# Patient Record
Sex: Female | Born: 1935 | Race: White | Hispanic: No | Marital: Married | Smoking: Never smoker
Health system: Southern US, Community
[De-identification: ages and names within clinical notes are randomized; demographics above are authoritative.]

## PROBLEM LIST (undated history)

## (undated) DIAGNOSIS — I350 Nonrheumatic aortic (valve) stenosis: Secondary | ICD-10-CM

## (undated) DIAGNOSIS — F329 Major depressive disorder, single episode, unspecified: Secondary | ICD-10-CM

## (undated) DIAGNOSIS — I447 Left bundle-branch block, unspecified: Secondary | ICD-10-CM

## (undated) DIAGNOSIS — E119 Type 2 diabetes mellitus without complications: Secondary | ICD-10-CM

## (undated) DIAGNOSIS — H9190 Unspecified hearing loss, unspecified ear: Secondary | ICD-10-CM

## (undated) DIAGNOSIS — N390 Urinary tract infection, site not specified: Secondary | ICD-10-CM

## (undated) DIAGNOSIS — Z8489 Family history of other specified conditions: Secondary | ICD-10-CM

## (undated) DIAGNOSIS — M1990 Unspecified osteoarthritis, unspecified site: Secondary | ICD-10-CM

## (undated) DIAGNOSIS — R51 Headache: Secondary | ICD-10-CM

## (undated) DIAGNOSIS — Z9889 Other specified postprocedural states: Secondary | ICD-10-CM

## (undated) DIAGNOSIS — I429 Cardiomyopathy, unspecified: Secondary | ICD-10-CM

## (undated) DIAGNOSIS — R112 Nausea with vomiting, unspecified: Secondary | ICD-10-CM

## (undated) DIAGNOSIS — R1310 Dysphagia, unspecified: Secondary | ICD-10-CM

## (undated) DIAGNOSIS — D649 Anemia, unspecified: Secondary | ICD-10-CM

## (undated) DIAGNOSIS — N189 Chronic kidney disease, unspecified: Secondary | ICD-10-CM

## (undated) DIAGNOSIS — K219 Gastro-esophageal reflux disease without esophagitis: Secondary | ICD-10-CM

## (undated) DIAGNOSIS — F419 Anxiety disorder, unspecified: Secondary | ICD-10-CM

## (undated) DIAGNOSIS — R519 Headache, unspecified: Secondary | ICD-10-CM

## (undated) DIAGNOSIS — I2510 Atherosclerotic heart disease of native coronary artery without angina pectoris: Secondary | ICD-10-CM

## (undated) DIAGNOSIS — E782 Mixed hyperlipidemia: Secondary | ICD-10-CM

## (undated) DIAGNOSIS — I214 Non-ST elevation (NSTEMI) myocardial infarction: Secondary | ICD-10-CM

## (undated) DIAGNOSIS — F32A Depression, unspecified: Secondary | ICD-10-CM

## (undated) DIAGNOSIS — I10 Essential (primary) hypertension: Secondary | ICD-10-CM

## (undated) DIAGNOSIS — Z8719 Personal history of other diseases of the digestive system: Secondary | ICD-10-CM

## (undated) HISTORY — DX: Atherosclerotic heart disease of native coronary artery without angina pectoris: I25.10

## (undated) HISTORY — DX: Cardiomyopathy, unspecified: I42.9

## (undated) HISTORY — PX: ABDOMINAL HYSTERECTOMY: SHX81

## (undated) HISTORY — DX: Type 2 diabetes mellitus without complications: E11.9

## (undated) HISTORY — PX: CHOLECYSTECTOMY: SHX55

## (undated) HISTORY — DX: Mixed hyperlipidemia: E78.2

## (undated) HISTORY — DX: Left bundle-branch block, unspecified: I44.7

## (undated) HISTORY — DX: Dysphagia, unspecified: R13.10

## (undated) HISTORY — PX: THYROID LOBECTOMY: SHX420

## (undated) HISTORY — DX: Unspecified osteoarthritis, unspecified site: M19.90

## (undated) HISTORY — DX: Nonrheumatic aortic (valve) stenosis: I35.0

## (undated) HISTORY — DX: Non-ST elevation (NSTEMI) myocardial infarction: I21.4

## (undated) HISTORY — DX: Essential (primary) hypertension: I10

---

## 2002-05-02 ENCOUNTER — Encounter: Payer: Self-pay | Admitting: Otolaryngology

## 2002-05-02 ENCOUNTER — Ambulatory Visit (HOSPITAL_COMMUNITY): Admission: RE | Admit: 2002-05-02 | Discharge: 2002-05-02 | Payer: Self-pay | Admitting: Otolaryngology

## 2003-10-17 ENCOUNTER — Ambulatory Visit (HOSPITAL_COMMUNITY): Admission: RE | Admit: 2003-10-17 | Discharge: 2003-10-17 | Payer: Self-pay | Admitting: Orthopedic Surgery

## 2004-04-26 HISTORY — PX: CORONARY ARTERY BYPASS GRAFT: SHX141

## 2004-04-26 HISTORY — PX: AORTIC VALVE REPLACEMENT: SHX41

## 2004-04-26 HISTORY — PX: TOTAL KNEE ARTHROPLASTY: SHX125

## 2004-05-07 ENCOUNTER — Ambulatory Visit: Payer: Self-pay | Admitting: Cardiology

## 2004-05-11 ENCOUNTER — Ambulatory Visit: Payer: Self-pay | Admitting: Cardiology

## 2004-05-11 ENCOUNTER — Inpatient Hospital Stay (HOSPITAL_BASED_OUTPATIENT_CLINIC_OR_DEPARTMENT_OTHER): Admission: RE | Admit: 2004-05-11 | Discharge: 2004-05-11 | Payer: Self-pay | Admitting: Cardiology

## 2004-05-11 ENCOUNTER — Inpatient Hospital Stay (HOSPITAL_COMMUNITY): Admission: AD | Admit: 2004-05-11 | Discharge: 2004-05-20 | Payer: Self-pay | Admitting: Internal Medicine

## 2004-05-12 ENCOUNTER — Encounter: Payer: Self-pay | Admitting: Cardiology

## 2004-05-15 ENCOUNTER — Encounter (INDEPENDENT_AMBULATORY_CARE_PROVIDER_SITE_OTHER): Payer: Self-pay | Admitting: Specialist

## 2004-06-16 ENCOUNTER — Ambulatory Visit: Payer: Self-pay | Admitting: Internal Medicine

## 2004-06-23 ENCOUNTER — Ambulatory Visit: Payer: Self-pay | Admitting: Cardiology

## 2004-07-13 ENCOUNTER — Ambulatory Visit: Payer: Self-pay | Admitting: Cardiology

## 2004-07-16 ENCOUNTER — Ambulatory Visit: Payer: Self-pay | Admitting: Cardiology

## 2004-07-16 ENCOUNTER — Inpatient Hospital Stay (HOSPITAL_COMMUNITY): Admission: AD | Admit: 2004-07-16 | Discharge: 2004-07-21 | Payer: Self-pay | Admitting: Cardiology

## 2004-07-20 ENCOUNTER — Encounter: Payer: Self-pay | Admitting: Internal Medicine

## 2004-07-27 ENCOUNTER — Ambulatory Visit: Payer: Self-pay | Admitting: Cardiology

## 2004-08-03 ENCOUNTER — Ambulatory Visit: Payer: Self-pay | Admitting: Cardiology

## 2004-08-06 ENCOUNTER — Ambulatory Visit: Payer: Self-pay | Admitting: Cardiology

## 2004-08-12 ENCOUNTER — Ambulatory Visit: Payer: Self-pay | Admitting: Cardiology

## 2004-09-11 ENCOUNTER — Ambulatory Visit: Payer: Self-pay | Admitting: Internal Medicine

## 2004-09-16 ENCOUNTER — Ambulatory Visit: Payer: Self-pay | Admitting: Cardiology

## 2004-10-12 ENCOUNTER — Ambulatory Visit: Payer: Self-pay | Admitting: Cardiology

## 2004-10-15 ENCOUNTER — Ambulatory Visit: Payer: Self-pay | Admitting: Internal Medicine

## 2004-10-30 ENCOUNTER — Ambulatory Visit: Payer: Self-pay | Admitting: Cardiology

## 2005-02-02 ENCOUNTER — Ambulatory Visit: Payer: Self-pay | Admitting: Internal Medicine

## 2005-02-16 ENCOUNTER — Ambulatory Visit: Payer: Self-pay

## 2005-02-16 ENCOUNTER — Ambulatory Visit: Payer: Self-pay | Admitting: Internal Medicine

## 2005-02-18 ENCOUNTER — Ambulatory Visit: Payer: Self-pay | Admitting: Internal Medicine

## 2005-03-23 ENCOUNTER — Inpatient Hospital Stay (HOSPITAL_COMMUNITY): Admission: RE | Admit: 2005-03-23 | Discharge: 2005-03-27 | Payer: Self-pay | Admitting: Orthopedic Surgery

## 2005-06-11 ENCOUNTER — Ambulatory Visit: Payer: Self-pay | Admitting: Cardiology

## 2005-06-23 ENCOUNTER — Ambulatory Visit: Payer: Self-pay | Admitting: Internal Medicine

## 2005-12-07 ENCOUNTER — Ambulatory Visit: Payer: Self-pay | Admitting: Internal Medicine

## 2005-12-14 ENCOUNTER — Ambulatory Visit: Payer: Self-pay

## 2005-12-14 ENCOUNTER — Encounter: Payer: Self-pay | Admitting: Internal Medicine

## 2006-03-29 IMAGING — CR DG CHEST 2V
2 series · 2 of 2 positions shown · non-contrast
Comparison: 05/17/04.

CLINICAL DATA: Coronary artery bypass graft.  Chest pain. 
 CHEST - 2 VIEW, 05/19/04:

[view not recorded (1 of 2)]
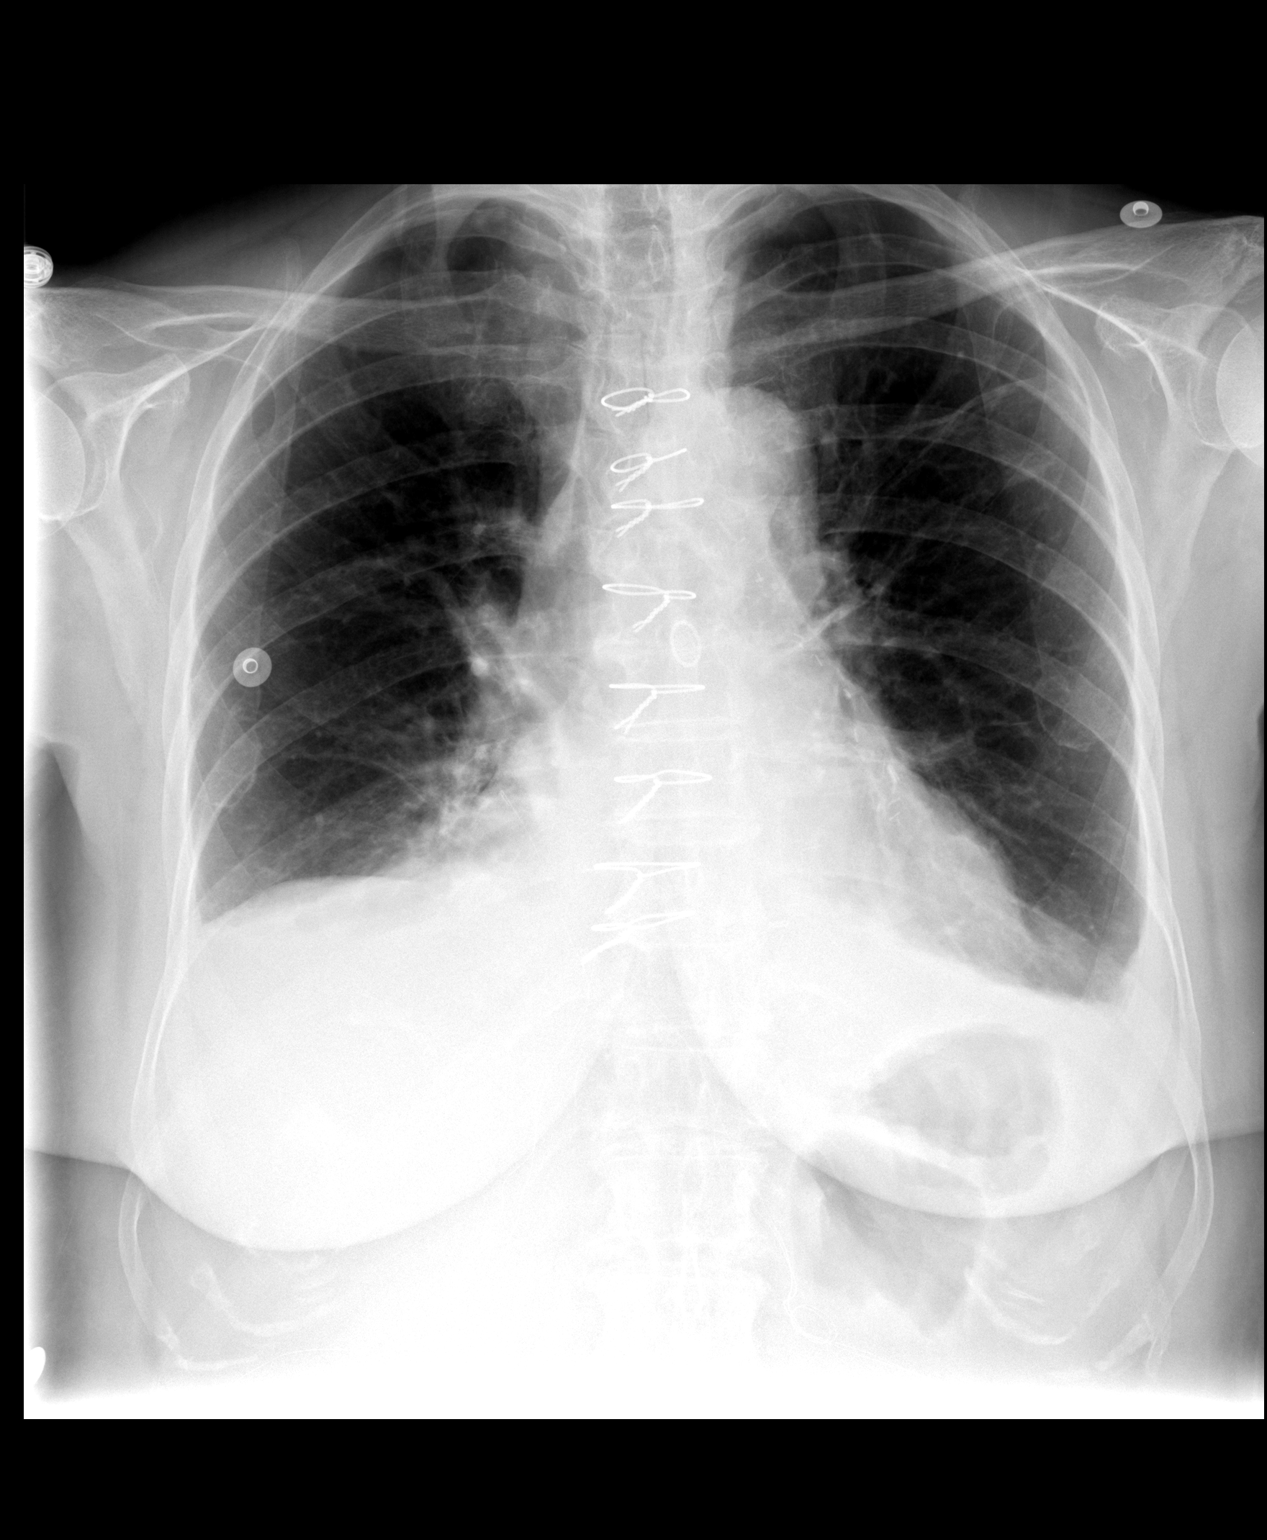

[view not recorded (2 of 2)]
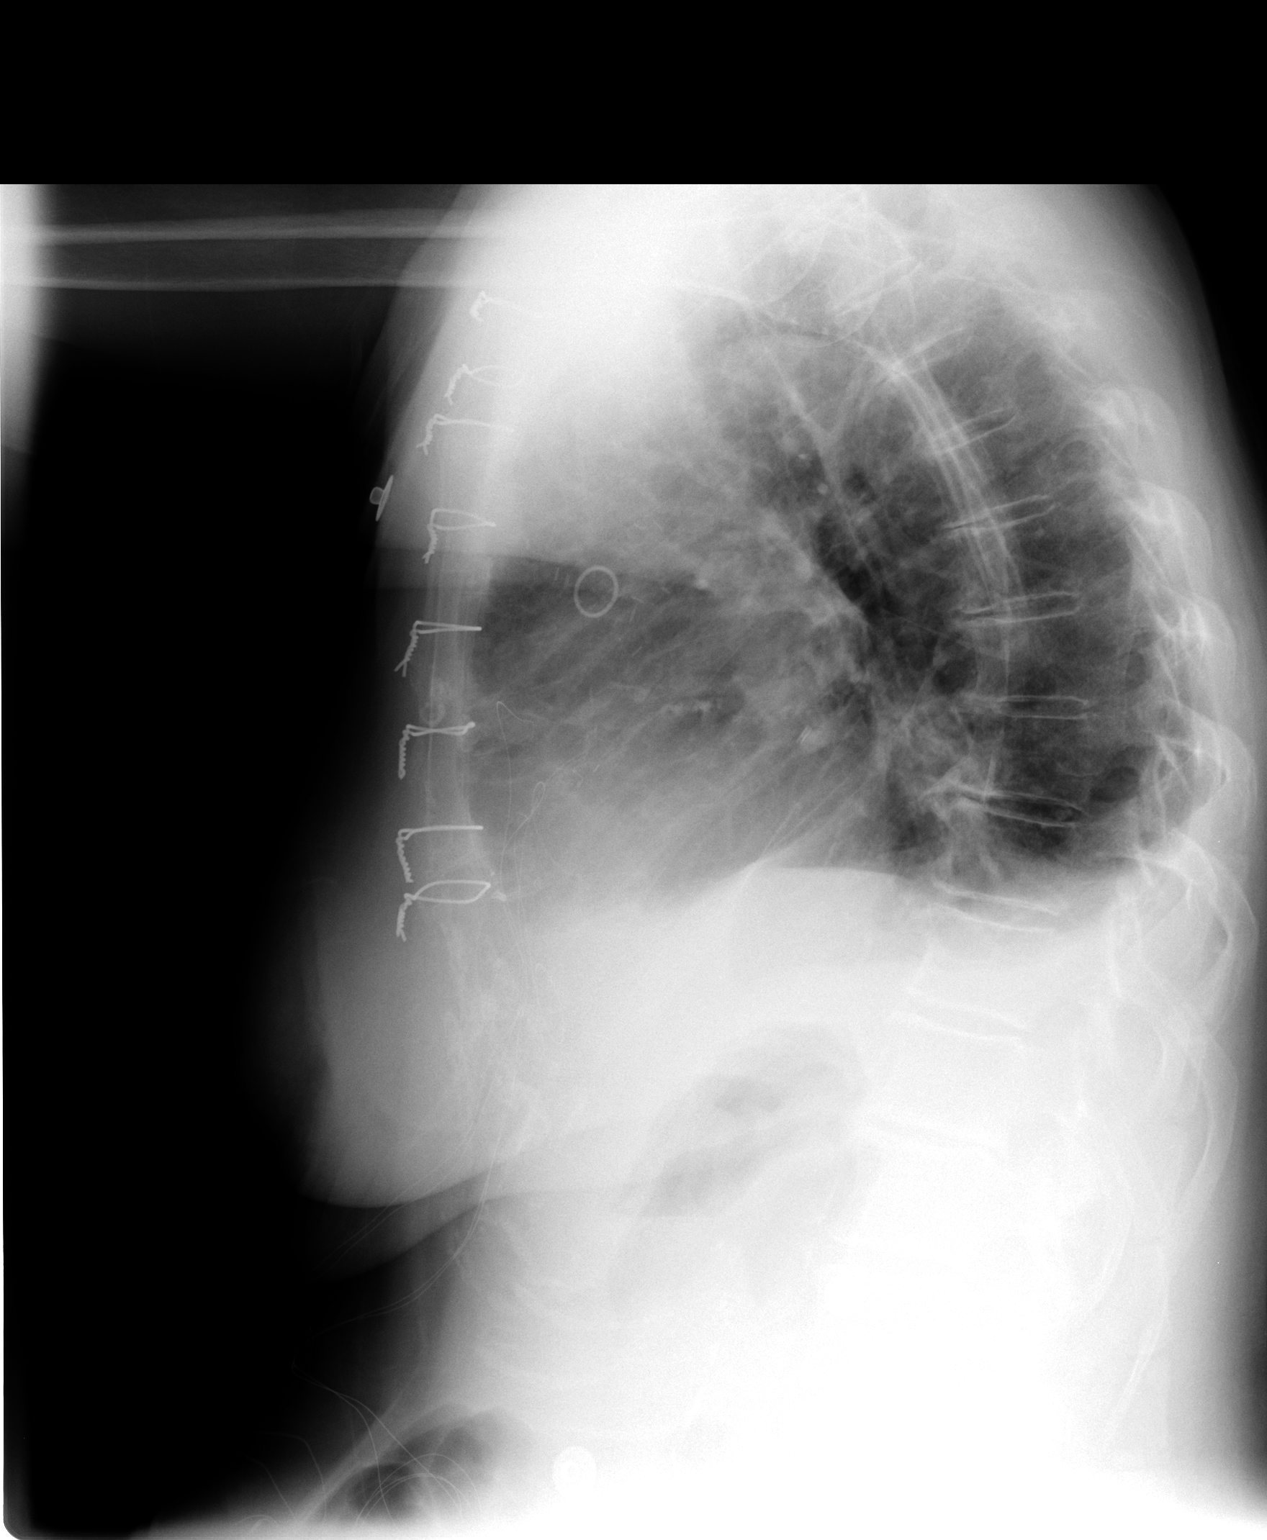

[2 of 2 positions shown; findings below may reference images not displayed]

Two-view exam of the chest shows cardiomegaly with vascular congestion.  There is atelectasis in the left upper lung and both bases with small bilateral pleural effusions.
IMPRESSION: Cardiomegaly with bibasilar atelectasis and small bilateral effusion.

## 2006-06-13 ENCOUNTER — Ambulatory Visit: Payer: Self-pay | Admitting: Internal Medicine

## 2006-11-29 ENCOUNTER — Ambulatory Visit: Payer: Self-pay | Admitting: Internal Medicine

## 2006-12-14 ENCOUNTER — Ambulatory Visit: Payer: Self-pay

## 2006-12-14 ENCOUNTER — Encounter: Payer: Self-pay | Admitting: Internal Medicine

## 2007-06-21 ENCOUNTER — Ambulatory Visit: Payer: Self-pay | Admitting: Internal Medicine

## 2007-12-11 ENCOUNTER — Ambulatory Visit: Payer: Self-pay | Admitting: Internal Medicine

## 2008-01-30 ENCOUNTER — Ambulatory Visit: Payer: Self-pay | Admitting: Internal Medicine

## 2008-01-30 LAB — CONVERTED CEMR LAB
AST: 24 units/L (ref 0–37)
Albumin: 4.3 g/dL (ref 3.5–5.2)
Alkaline Phosphatase: 88 units/L (ref 39–117)
BUN: 25 mg/dL — ABNORMAL HIGH (ref 6–23)
Chloride: 95 meq/L — ABNORMAL LOW (ref 96–112)
Cholesterol: 146 mg/dL (ref 0–200)
GFR calc Af Amer: 69 mL/min
GFR calc non Af Amer: 57 mL/min
LDL Cholesterol: 84 mg/dL (ref 0–99)
Potassium: 3.5 meq/L (ref 3.5–5.1)
Sodium: 138 meq/L (ref 135–145)
TSH: 0.34 microintl units/mL — ABNORMAL LOW (ref 0.35–5.50)
Total Bilirubin: 1 mg/dL (ref 0.3–1.2)
Total CHOL/HDL Ratio: 3
VLDL: 13 mg/dL (ref 0–40)

## 2008-02-12 ENCOUNTER — Ambulatory Visit: Payer: Self-pay

## 2008-02-12 ENCOUNTER — Encounter: Payer: Self-pay | Admitting: Internal Medicine

## 2008-08-03 DIAGNOSIS — E785 Hyperlipidemia, unspecified: Secondary | ICD-10-CM

## 2008-08-05 ENCOUNTER — Ambulatory Visit: Payer: Self-pay | Admitting: Internal Medicine

## 2008-08-05 ENCOUNTER — Encounter: Payer: Self-pay | Admitting: Internal Medicine

## 2008-08-05 DIAGNOSIS — I2510 Atherosclerotic heart disease of native coronary artery without angina pectoris: Secondary | ICD-10-CM | POA: Insufficient documentation

## 2008-08-05 DIAGNOSIS — I10 Essential (primary) hypertension: Secondary | ICD-10-CM

## 2008-08-05 DIAGNOSIS — I359 Nonrheumatic aortic valve disorder, unspecified: Secondary | ICD-10-CM | POA: Insufficient documentation

## 2008-08-05 DIAGNOSIS — I429 Cardiomyopathy, unspecified: Secondary | ICD-10-CM

## 2008-10-25 ENCOUNTER — Encounter (HOSPITAL_COMMUNITY): Admission: RE | Admit: 2008-10-25 | Discharge: 2008-11-24 | Payer: Self-pay | Admitting: Orthopedic Surgery

## 2009-02-20 ENCOUNTER — Ambulatory Visit: Payer: Self-pay | Admitting: Cardiovascular Disease

## 2009-02-20 ENCOUNTER — Encounter: Payer: Self-pay | Admitting: Internal Medicine

## 2009-02-20 ENCOUNTER — Ambulatory Visit: Payer: Self-pay | Admitting: Internal Medicine

## 2009-02-20 ENCOUNTER — Ambulatory Visit (HOSPITAL_COMMUNITY): Admission: RE | Admit: 2009-02-20 | Discharge: 2009-02-20 | Payer: Self-pay | Admitting: Internal Medicine

## 2009-02-20 DIAGNOSIS — I440 Atrioventricular block, first degree: Secondary | ICD-10-CM | POA: Insufficient documentation

## 2009-02-20 DIAGNOSIS — I519 Heart disease, unspecified: Secondary | ICD-10-CM | POA: Insufficient documentation

## 2009-10-01 ENCOUNTER — Telehealth (INDEPENDENT_AMBULATORY_CARE_PROVIDER_SITE_OTHER): Payer: Self-pay | Admitting: *Deleted

## 2009-12-09 ENCOUNTER — Encounter: Payer: Self-pay | Admitting: Internal Medicine

## 2009-12-09 DIAGNOSIS — I6529 Occlusion and stenosis of unspecified carotid artery: Secondary | ICD-10-CM

## 2009-12-10 ENCOUNTER — Ambulatory Visit: Payer: Self-pay

## 2009-12-10 ENCOUNTER — Encounter: Payer: Self-pay | Admitting: Internal Medicine

## 2010-02-02 ENCOUNTER — Ambulatory Visit (HOSPITAL_COMMUNITY)
Admission: RE | Admit: 2010-02-02 | Discharge: 2010-02-02 | Payer: Self-pay | Source: Home / Self Care | Admitting: Internal Medicine

## 2010-02-02 ENCOUNTER — Ambulatory Visit: Payer: Self-pay

## 2010-02-02 ENCOUNTER — Ambulatory Visit: Payer: Self-pay | Admitting: Cardiology

## 2010-02-02 ENCOUNTER — Ambulatory Visit: Payer: Self-pay | Admitting: Internal Medicine

## 2010-02-02 ENCOUNTER — Encounter: Payer: Self-pay | Admitting: Internal Medicine

## 2010-02-02 DIAGNOSIS — R5383 Other fatigue: Secondary | ICD-10-CM

## 2010-02-02 DIAGNOSIS — R5381 Other malaise: Secondary | ICD-10-CM

## 2010-02-13 ENCOUNTER — Telehealth: Payer: Self-pay | Admitting: Internal Medicine

## 2010-02-13 ENCOUNTER — Telehealth (INDEPENDENT_AMBULATORY_CARE_PROVIDER_SITE_OTHER): Payer: Self-pay | Admitting: *Deleted

## 2010-05-16 ENCOUNTER — Encounter: Payer: Self-pay | Admitting: Orthopedic Surgery

## 2010-05-17 ENCOUNTER — Encounter: Payer: Self-pay | Admitting: Orthopedic Surgery

## 2010-05-18 ENCOUNTER — Encounter: Payer: Self-pay | Admitting: Orthopedic Surgery

## 2010-05-28 NOTE — Progress Notes (Signed)
  All Cardiac faxed to [facility] @ [phone] [facility]  February 13, 2010 10:27 AM

## 2010-05-28 NOTE — Assessment & Plan Note (Signed)
Summary: f1y   Visit Type:  1 year follow up  CC:  Tiredness.  History of Present Illness: Ms. Bigos is a delightful 77 year old woman with a history of coronary artery disease, LBBB and severe aortic stenosis.  She is status post bypass surgery and aortic valve replacement with St. Jude's bioprosthesis in 2006.  Post-operation, she had a drop in her ejection fraction of 25-30% but this is recovered.  EF was 55% in October 2010 with mildly incresed gradient across valve 17     mm HG.  Returns for routine f/u. Gets tired very easily and SOB with moderate activity - no real change. Feels she can do housework or light housework for 3-4 hours then very fatigued. No orthopnea/PND or edema. No CP. Found to be anemic and asking if she can come off ASA.  PCP has been suggesting colonoscopy but she has refused.   She had routine echo today to f/u AoV. I have reviewed her echo personall EF 50-55% with elevated LV filiing pressures. mild mr/moderate TR. AoV prosthesis looks good. mean gradient 13mm HG  Recent lipids total chol 117 TG 109 HDL 44 LDL 44   Current Medications (verified): 1)  Spironolactone 25 Mg Tabs (Spironolactone) .... 1/2 Once Daily 2)  Metformin Hcl 500 Mg Tabs (Metformin Hcl) .Marland Kitchen.. 1 Once Daily 3)  Lasix 40 Mg Tabs (Furosemide) .Marland Kitchen.. 1 Two Times A Day 4)  Benazepril Hcl 40 Mg Tabs (Benazepril Hcl) .Marland Kitchen.. 1 Once Daily 5)  Aspirin 81 Mg  Tabs (Aspirin) .... Q Once Daily. On Hold 6)  Synthroid 88 Mcg Tabs (Levothyroxine Sodium) .... Take 1 Tablet By Mouth Once A Day 7)  Klor-Con M20 20 Meq Cr-Tabs (Potassium Chloride Crys Cr) .Marland Kitchen.. 1 Two Times A Day 8)  Toprol Xl 100 Mg Xr24h-Tab (Metoprolol Succinate) .Marland Kitchen.. 1 Once Daily 9)  Sertraline Hcl 100 Mg Tabs (Sertraline Hcl) .... 1/2 Once Daily 10)  Omeprazole 20 Mg Tbec (Omeprazole) .Marland Kitchen.. 1 Two Times A Day 11)  Lipitor 80 Mg Tabs (Atorvastatin Calcium) .Marland Kitchen.. 1 Once Daily 12)  Zetia 10 Mg Tabs (Ezetimibe) .... Take 1 Once  Daily  Allergies: 1)  ! Morphine 2)  ! Pcn  Past History:  Past Medical History: Last updated: 02/20/2009  1. Coronary artery disease and severe aortic stenosis discovered during      preoperative evaluation for knee replacement.      a.     S/p AVR with St. Jude Bioprosthesis and 2-V CABG      b.    October 2009. Echo: Mildly incresed gradient across valve 23mm HG      c.    October 2010 Echo: EF 55-605 mild MR, mod TR. signifcant diastolic dysfx. AVG  2. Congestive heart failure postoperatively.      a.     Relook catheterization with patent grafts.      b.     Echocardiogram  March 2006, ejection fraction 25-35% with mild MR      c.     Echocardiogram  October 2006, ejection fraction 60%  3. Mild mitral regurgitation.  4. Osteoarthritis status post knee replacement.  5. Left bundle branch block.  6. Diabetes.  7. Hypertension.  8. Hyperlipidemia.  Review of Systems       As per HPI and past medical history; otherwise all systems negative.   Vital Signs:  Patient profile:   77 year old female Height:      64 inches Weight:  147.50 pounds BMI:     25.41 Pulse rate:   64 / minute Pulse rhythm:   regular Resp:     18 per minute BP sitting:   110 / 60  (left arm) Cuff size:   regular  Vitals Entered By: Vikki Ports (February 02, 2010 2:18 PM)  Physical Exam  General:  Elderly. well appearing. no resp difficulty HEENT: normal Neck: supple. no JVD. Carotids 2+ bilat; no bruits. No lymphadenopathy or thryomegaly appreciated. Cor: PMI nondisplaced. Regular rate & rhythm. No rubs, gallops, murmur. Lungs: clear Abdomen: soft, nontender, nondistended. No hepatosplenomegaly. No bruits or masses. Good bowel sounds. Extremities: no cyanosis, clubbing, rash, edema Neuro: alert & orientedx3, cranial nerves grossly intact. moves all 4 extremities w/o difficulty. affect pleasant    Impression & Recommendations:  Problem # 1:  AORTIC STENOSIS/ INSUFFICIENCY,  NON-RHEUMATIC (ICD-424.1) AVR looks good. Only mild gradient over the valve.  Problem # 2:  CAD, NATIVE VESSEL (ICD-414.01) Stable. No evidence of ischemia. Continue current regimen. Continue ASA 81 once daily.   Problem # 3:  FATIGUE / MALAISE (ICD-780.79) Chronic problem. Likely multifactorial. Given anemia suggested she make appt with Dr. Karilyn Cota to discuss colonoscopy.   Patient Instructions: 1)  Your physician recommends that you schedule a follow-up appointment in: one year 2)  Your physician has requested that you have an echocardiogram.  Echocardiography is a painless test that uses sound waves to create images of your heart. It provides your doctor with information about the size and shape of your heart and how well your heart's chambers and valves are working.  This procedure takes approximately one hour. There are no restrictions for this procedure. To be done in one year on same day as appointment with Dr. Gala Romney. 3)  Call Dr.Rehman to schedule appointment

## 2010-05-28 NOTE — Progress Notes (Signed)
  Mailed Pt [facility]  October 01, 2009 3:58 PM

## 2010-05-28 NOTE — Progress Notes (Signed)
Summary: needs auth to go off aspirin for 5 days  Phone Note From Other Clinic   Caller: [phone] ext [phone] louann Summary of Call: can pt stop aspirin for five days prior to surgery with  orthopedic center/10-26/would prefer a fax auth at [phone] Initial call taken by: Glynda Jaeger,  February 13, 2010 11:04 AM  Follow-up for Phone Call        ok Dolores Patty, MD, [facility]  February 13, 2010 12:26 PM  note faxed Meredith Staggers, RN  February 13, 2010 1:27 PM

## 2010-05-28 NOTE — Miscellaneous (Signed)
Summary: Orders Update  Clinical Lists Changes  Problems: Added new problem of CAROTID ARTERY DISEASE (ICD-433.10) Orders: Added new Test order of Carotid Duplex (Carotid Duplex) - Signed 

## 2010-07-31 ENCOUNTER — Encounter: Payer: Self-pay | Admitting: Internal Medicine

## 2010-08-03 ENCOUNTER — Encounter: Payer: Self-pay | Admitting: Internal Medicine

## 2010-08-04 ENCOUNTER — Encounter: Payer: Self-pay | Admitting: Internal Medicine

## 2010-08-06 ENCOUNTER — Ambulatory Visit (INDEPENDENT_AMBULATORY_CARE_PROVIDER_SITE_OTHER): Payer: Medicare Other | Admitting: Internal Medicine

## 2010-08-06 ENCOUNTER — Encounter: Payer: Self-pay | Admitting: Internal Medicine

## 2010-08-06 VITALS — BP 118/60 | HR 72 | Resp 14 | Ht 64.0 in | Wt 146.0 lb

## 2010-08-06 DIAGNOSIS — R5383 Other fatigue: Secondary | ICD-10-CM

## 2010-08-06 DIAGNOSIS — R5381 Other malaise: Secondary | ICD-10-CM

## 2010-08-06 DIAGNOSIS — E785 Hyperlipidemia, unspecified: Secondary | ICD-10-CM

## 2010-08-06 DIAGNOSIS — I2510 Atherosclerotic heart disease of native coronary artery without angina pectoris: Secondary | ICD-10-CM

## 2010-08-06 MED ORDER — POTASSIUM CHLORIDE CRYS ER 20 MEQ PO TBCR: 20.0000 meq | EXTENDED_RELEASE_TABLET | Freq: Two times a day (BID) | ORAL | Status: DC

## 2010-08-06 MED ORDER — METOPROLOL SUCCINATE ER 50 MG PO TB24: ORAL_TABLET | ORAL | Status: DC

## 2010-08-06 MED ORDER — METOPROLOL SUCCINATE ER 100 MG PO TB24: ORAL_TABLET | ORAL | Status: DC

## 2010-08-06 MED ORDER — ATORVASTATIN CALCIUM 80 MG PO TABS: 80.0000 mg | ORAL_TABLET | Freq: Every day | ORAL | Status: DC

## 2010-08-06 MED ORDER — SPIRONOLACTONE 25 MG PO TABS: 12.5000 mg | ORAL_TABLET | Freq: Every day | ORAL | Status: DC

## 2010-08-06 MED ORDER — FUROSEMIDE 40 MG PO TABS: 40.0000 mg | ORAL_TABLET | Freq: Two times a day (BID) | ORAL | Status: DC

## 2010-08-06 MED ORDER — BENAZEPRIL HCL 40 MG PO TABS: 40.0000 mg | ORAL_TABLET | Freq: Every day | ORAL | Status: DC

## 2010-08-06 MED ORDER — EZETIMIBE 10 MG PO TABS: 10.0000 mg | ORAL_TABLET | Freq: Every day | ORAL | Status: DC

## 2010-08-06 NOTE — Patient Instructions (Signed)
Decrease Metoprolol to 50mg  daily (1/2 tab) Your physician wants you to follow-up in: 3 months You will receive a reminder letter in the mail two months in advance. If you don't receive a letter, please call our office to schedule the follow-up appointment.

## 2010-08-06 NOTE — Assessment & Plan Note (Signed)
Unclear etiology. We reviewed her labs from February and has only mild anemia (hgb 11.0) with normal TSH. We discussed various possibilities. Will try cutting Toprol down to 50 daily and [facility] Check labs with CBC (manual diff), TSH, BMET and ESR.

## 2010-08-06 NOTE — Progress Notes (Signed)
HPI:  Erin Herman is a delightful 77 year old woman with a history of coronary artery disease, LBBB and severe aortic stenosis.  She is status post bypass surgery and aortic valve replacement with St. Jude's bioprosthesis in 2006.  Post-operation, she had a drop in her ejection fraction of 25-30% but this is recovered.  EF was 55% in October 2011 with grade 2 diastolic dysfunction  mildly incresed gradient across valve 15 mm HG.  Returns for routine f/u. Gets tired very easily and SOB with moderate activity. She feels that she is getting more fatigued. Wondering if it is her statin. No orthopnea/PND or edema. No CP. Found to be anemic and asking if she can come off ASA.  PCP has been suggesting colonoscopy but she has refused.   No fever, chills, night sweats or weight loss. Denies depression.   ROS: All systems negative except as listed in HPI, PMH and Problem List.  Past Medical History  Diagnosis Date  . CAD, NATIVE VESSEL 08/05/2008  . HYPERLIPIDEMIA-MIXED 08/03/2008  . HYPERTENSION, BENIGN 08/05/2008  . LBBB (left bundle branch block)   . Diabetes mellitus   . Aortic stenosis     s/p bioprosthetic AVR  . Fatigue     Current Outpatient Prescriptions  Medication Sig Dispense Refill  . aspirin 81 MG tablet Take 81 mg by mouth daily.        Marland Kitchen atorvastatin (LIPITOR) 80 MG tablet Take 80 mg by mouth daily.        . benazepril (LOTENSIN) 40 MG tablet Take 40 mg by mouth daily.        Marland Kitchen ezetimibe (ZETIA) 10 MG tablet Take 10 mg by mouth daily.        . furosemide (LASIX) 40 MG tablet Take 40 mg by mouth 2 (two) times daily.        Marland Kitchen levothyroxine (SYNTHROID, LEVOTHROID) 88 MCG tablet Take 88 mcg by mouth daily.        . metFORMIN (GLUCOPHAGE) 500 MG tablet Take 500 mg by mouth daily with breakfast.        . metoprolol (TOPROL-XL) 100 MG 24 hr tablet Take 100 mg by mouth daily.        Marland Kitchen omeprazole (PRILOSEC) 20 MG capsule Take 20 mg by mouth 2 (two) times daily.        . potassium  chloride SA (K-DUR,KLOR-CON) 20 MEQ tablet Take 20 mEq by mouth 2 (two) times daily.        . sertraline (ZOLOFT) 100 MG tablet Take 50 mg by mouth daily.        Marland Kitchen spironolactone (ALDACTONE) 25 MG tablet Take 12.5 mg by mouth daily.           PHYSICAL EXAM: Filed Vitals:   08/06/10 1648  BP: 118/60  Pulse: 72  Resp: 14   General:  Well appearing. No resp difficulty HEENT: normal Neck: supple. JVP flat. Carotids 2+ bilaterally; no bruits. No lymphadenopathy or thryomegaly appreciated. Cor: PMI normal. Regular rate & rhythm. No rubs, gallops or murmurs. Lungs: clear Abdomen: soft, nontender, nondistended. No hepatosplenomegaly. No bruits or masses. Good bowel sounds. Extremities: no cyanosis, clubbing, rash, edema Neuro: alert & orientedx3, cranial nerves grossly intact. Moves all 4 extremities w/o difficulty. Affect pleasant.    ECG: refused   ASSESSMENT & PLAN:

## 2010-08-06 NOTE — Assessment & Plan Note (Signed)
Doubt fatigue related to cholesterol meds but told her she can stop for a few weeks if needed.

## 2010-08-06 NOTE — Assessment & Plan Note (Signed)
No evidence of ischemia. Continue current regimen.   

## 2010-09-08 NOTE — Assessment & Plan Note (Signed)
[facility]                            CARDIOLOGY OFFICE NOTE   NAME:Erin Herman, Erin Herman                MRN:          [id number]  DATE:01/30/2008                            DOB:          May 31, 1927    PRIMARY CARE PHYSICIAN:  Kirstie Peri, MD   INTERVAL HISTORY:  Erin Herman is a delightful 77 year old woman with  a history of coronary artery disease and severe aortic stenosis.  She is  status post bypass surgery and aortic valve replacement with St. Jude's  bioprosthesis in 2006.  Post-operation, she had a drop in her ejection  fraction of 25-30% but this is recovered.  Most recent EF was 55-60% in  August 2008.  She did have moderate mitral regurgitation at that time.   She returns today for routine followup.  She says she is doing fairly  well.  She denies any chest pain or shortness of breath.  She says she  just feels weak, but she says she can keep up with other woman of her  age.  She has not had any orthopnea, no PND.  No lower extremity edema.   CURRENT MEDICATIONS:  1. Spironolactone 12.5 a day.  2. Metformin 500 a day.  3. Lasix 40 b.i.d.  4. Benazepril 40 a day.  5. Aspirin 81 a day.  6. Synthroid 100 mcg a day.  7. Toprol 100 mg a day.  8. Potassium 20 b.i.d.  9. Oxybutynin 10 b.i.d.  10.Multivitamin.  11.Zoloft 50 a day.  12.Prilosec 20 b.i.d.  13.Lipitor 80 a day.   PHYSICAL EXAMINATION:  GENERAL:  She is an elderly woman in no acute  distress.  Ambulates around the clinic without any respiratory  difficulty.  VITAL SIGNS:  Blood pressure is 105/60, heart rate 70, weight is 130.  HEENT:  Normal.  NECK:  Supple.  No JVD.  Carotids are 2+ bilaterally.  I do not hear any  bruits.  There is no lymphadenopathy or thyromegaly.  CARDIAC:  PMI is  nondisplaced.  She is regular rate and rhythm.  There is a systolic  ejection murmur at the left sternal border that increases with  inspiration.  She does have a very soft  holosystolic murmur at the apex.  LUNGS:  Clear.  ABDOMEN:  Soft, nontender, nondistended.  No hepatosplenomegaly.  No  bruits, no masses.  Good bowel sounds.  EXTREMITIES:  Warm with no  cyanosis, clubbing or edema.  No rash.  NEUROLOGICAL:  Alert and oriented x3.  Cranial nerves II through XII are  intact.  Moves all four extremities without difficulty.  Affect is  pleasant.   EKG shows normal sinus rhythm at a rate of 70 with a left bundle-branch  block.   ASSESSMENT AND PLAN:  1. Coronary artery disease.  She is doing quite well.  No evidence of      ischemia.  Continue current therapy.  2. History of left ventricular dysfunction, this is resolved.      Continue ACE inhibitor and beta-blocker.  3. Fatigue, I suspect this is multifactorial.  I have asked her to      switch her  Toprol to nighttime if she feels better.  Also, she is      concerned that her thyroid level may be too low.  We will check      thyroid panel today.  4. Hyperlipidemia.  Goal LDL is less than 70.  We will recheck today.   DISPOSITION:  We will see her back in 6-9 months for followup.     Erin Buckles. Bensimhon, MD  Electronically Signed    DRB/MedQ  DD: 01/30/2008  DT: 01/31/2008  Job #: [id number]   cc:   Kirstie Peri, MD

## 2010-09-08 NOTE — Assessment & Plan Note (Signed)
[facility]                            CARDIOLOGY OFFICE NOTE   NAME:Bartelt, VERL WHITMORE                MRN:          [id number]  DATE:11/29/2006                            DOB:          31-Jan-1928    PRIMARY CARE PHYSICIAN:  Kirstie Peri, MD.   INTERVAL HISTORY:  [name] is a delightful 77 year old woman with  a history of coronary artery disease and severe aortic stenosis, status  post coronary artery bypass grafting and aortic valve replacement with a  St. Jude valve prosthesis in January 2006.  Postoperatively she had a  drop in her EF to 25-35%.  However, on most recent echocardiogram in  August 2007.  Ejection fraction 45-50% with mild to moderate mitral  regurgitation.  She also has a history of hypothyroidism, hypertension,  diabetes and hyperlipidemia.   She returns today for routine followup.  She continues to feel very  fatigued, says she has also had muscle cramps and feels like her hair is  falling out.  She worries about her thyroid and her potassium.  She saw  Dr. Sherryll Burger about one week ago, who did check some electrolytes and a CBC;  we are in the process of getting this data.  She said a thyroid panel  was not checked.   From a cardiac perspective she denies chest pain or shortness of breath,  no lower extremity edema, no orthopnea, no PND.  She seems to be doing  fairly well.   CURRENT MEDICATIONS:  1. Aldactone 12.5 mg daily.  2. Lasix 40 mg b.i.d.  3. Benazapril 40 mg daily.  4. Aspirin 81 mg daily.  5. Synthroid 100 mcg daily.  6. Toprol XL 100 mg daily.  7. Potassium 20 mg b.i.d.  8. Oxybutynin 10 mg b.i.d.  9. Multivitamin.  10.Digoxin 0.125 mg daily.  11.Zoloft 50 mg daily.  12.Lipitor 80 mg daily.  13.Prilosec 20 mg b.i.d.   PHYSICAL EXAMINATION:  She is a very pleasant woman, in no acute  distress.  Ambulates around the Clinic slowly without any respiratory  difficulty.  HEENT:  Normal, except for some  pale conjunctivae.  NECK:  Supple no JVD.  Carotids are 2+ bilaterally with bilateral soft  bruits.  LUNGS:  Clear.  CARDIAC:  PMI is nondisplaced.  She has a regular rate and rhythm with a  soft systolic ejection murmur at the right sternal border.  Her valve  sounds are crisp.  ABDOMEN:  Soft and nontender, nondistended.  No hepatosplenomegaly.  No  bruits.  No masses.  Good bowel sounds.  EXTREMITIES:  Warm with no cyanosis, clubbing or edema.  No rash.  NEUROLOGIC:  She is alert and oriented x3.  Cranial nerves II-XII are  intact.  Moves all four extremities without difficulty.  Affect is  appropriate.   EKG:  Shows sinus bradycardia with a first-degree AV block at 59 beats  per minute, and a chronic left bundle branch block.   ASSESSMENT AND PLAN:  1. CORONARY ARTERY DISEASE STATUS POST BYPASS.  She is stable without      any events or ongoing ischemia.  2. AORTIC STENOSIS.  Post bioprosthetic aortic valve.  She is due for      a repeat echocardiogram.  3. HYPERTENSION.  Well controlled.  4. CAROTID BRUIT.  I am not sure if this has radiated off her aortic      valve, or due to intrinsic carotid disease.  We will check a      carotid ultrasound.  Previously carotids looked good.  5. FATIGUE.  I suspect this is multifactorial.  We will go ahead and      get her labs from [facility] as well as send a thyroid panel.  She      will follow up with Dr. Sherryll Burger.  If she continues to feel this      fatigue, then it may be reasonable to try and back off some of her      blood pressure medications just a little bit.  6. LEFT VENTRICULAR DYSFUNCTION.  This is improved.  Continue ACE      inhibitor and beta blocker as tolerated.  Repeat echocardiogram      will be scheduled.   DISPOSITION:  Return to the Clinic in 6 months.     Bevelyn Buckles. Bensimhon, MD  Electronically Signed    DRB/MedQ  DD: 11/29/2006  DT: 11/29/2006  Job #: [id number]   cc:   Kirstie Peri, MD

## 2010-09-08 NOTE — Assessment & Plan Note (Signed)
[facility]                            CARDIOLOGY OFFICE NOTE   NAME:Erin Herman, Erin Herman                MRN:          [id number]  DATE:06/21/2007                            DOB:          01-Jul-1927    PRIMARY CARE PHYSICIAN:  Erin Peri, MD   INTERVAL HISTORY:  [name] is a delightful 77 year old woman with  a history of coronary artery disease and severe aortic stenosis.  She is  status post bypass grafting and aortic valve replacement with St. Jude  prosthesis in 2006.  Post operatively she had a drop her EF of 25-35%.  However, this has recovered. Echocardiogram August 2008 showed an EF of  55-60%.  However, she did have at least moderate mitral regurgitation.  Aortic valve stable.   She returns today for team followup.  Overall, she says she feels okay.  She denies any chest pain or shortness of breath.  She has occasionally  some swelling her feet, but none in her ankles or any were above that.  No orthopnea, no PND. No chest pain.   CURRENT MEDICATIONS:  1. Lasix 40 b.i.d.  2. Aspirin 81 a day.  3. Synthroid 100 mcg a day.  4. Toprol 100 a day.  5. Potassium 20 b.i.d.  6. Oxybutynin 10 b.i.d.  7. Digoxin 0.125 a day.  8. Zoloft 50 a day.  9. Omeprazole 20 b.i.d.  10.Lipitor 80 a day.  11.Metformin 500 a day.  12.Spirolactone 12.5 a day.   PHYSICAL EXAM:  She is an elderly woman in no acute distress. She  ambulates around the clinic without any respiratory difficulty.  Blood  pressure is 100/62, her heart rate is 69, weight is 127.  HEENT is normal.  Neck is supple and her there is no JVD.  Carotids have faint bilateral  bruits.  No lymphadenopathy or thyromegaly.  LUNGS:  Clear.  CARDIAC:  PMI is nondisplaced.  She is regular with soft systolic  ejection murmur at the right sternal border.  Valve sounds are crisp.  Does have a very soft holosystolic murmur at the apex.  No S3. Sternum  seems slightly uneven but not  unstable.  It is nontender.  There is no  erythema.  ABDOMEN:  Soft, nontender, nondistended. No hepatosplenomegaly, no  bruits, no masses.  Good bowel sounds.  EXTREMITIES: Warm with no cyanosis, clubbing or edema.  No rash.  NEURO:  Alert and oriented x3.  Cranial nerves II-XII intact.  Moves all  four extremities without difficulty.  Affect is appropriate.   EKG shows sinus rhythm with chronic left bundle branch block.   ASSESSMENT/PLAN:  1. Normal coronary disease status post bypass.  She is doing well.  No      evidence of ischemia.  2. Aortic stenosis status post bi-prosthetic aortic valve.  This is      stable.  3. Left ventricular dysfunction.  This is recovered. She is a normal      ejection fraction. Does have moderate mitral regurgitation.  Given      her normalization of left ventricular function, we will stop her  digoxin. We can also decrease her Lasix and potassium to once a day      and she can take extra doses as needed.  4. Hypertension, well-controlled.  5. Carotid bruits.  Most recent ultrasound showed normal carotids.  6. Unevenness of sternum.  I think this is fine.  However, also ask      Dr. Cornelius Herman to see her just to make sure everything is stable from his      standpoint.  7. Hyperlipidemia.  She is due for lipid checked as well as a liver      panel.  Goal LDL is less than 70.   DISPOSITION:  Will see her back for routine follow-up in 6 months.     Erin Buckles. Bensimhon, MD  Electronically Signed    DRB/MedQ  DD: 06/21/2007  DT: 06/21/2007  Job #: [id number]   cc:   Erin Herman. Erin Herman, M.D.  Erin Peri, MD

## 2010-09-11 NOTE — Op Note (Signed)
NAME:  Erin Herman, Erin Herman                   ACCOUNT NO.:  [id number]   MEDICAL RECORD NO.:  [id number]                   PATIENT TYPE:  AMB   LOCATION:  DAY                                  FACILITY:  [facility]   PHYSICIAN:  Madlyn Frankel. Charlann Boxer, M.D.               DATE OF BIRTH:  February 07, 1928   DATE OF PROCEDURE:  10/17/2003  DATE OF DISCHARGE:                                 OPERATIVE REPORT   PREOPERATIVE DIAGNOSIS:  Right knee lateral meniscal tear associated with  tricompartmental chondromalacia.   POSTOPERATIVE DIAGNOSIS:  1. Grade 2-3 chondromalacia to the patellofemoral compartment primarily on     the patellar surface, medial and lateral facets.  2. Abundant and multiple loose bodies, some measuring approximately 7-8 mm     in diameter.  3. Grade 3-4 chondromalacia in the medial compartment, primarily involving     the medial femoral condyle, measuring about 15 x 15 mm on the distal     weightbearing aspect as well as smaller 5 x 7 mm area on the tibial     plateau.  4. Degenerative and macerated complex tears to the mid body, primarily the     mid body of the lateral meniscus, associated with a grade 3     chondromalacia of the same variety of the lateral compartment.   DESCRIPTION OF PROCEDURE:  1. Diagnostic and operative right knee arthroscopy with tricompartmental     chondroplasty.  2. Removal of loose bodies.  3. Partial lateral meniscectomy.   SURGEON:  Madlyn Frankel. Charlann Boxer, M.D.   ASSISTANT:  None.   ANESTHESIA:  General.   ESTIMATED BLOOD LOSS:  None.   COMPLICATIONS:  None apparent.   INDICATIONS FOR PROCEDURE:  Mrs. Arrona is a very pleasant 77 year old  female who had presented for evaluation on referral for right knee pain  following a twisting type injury.  MRI revealed partial lateral meniscal  tear which correlated with exam findings.  MRI had also detected a slight  tear to the posterior horn of the medial meniscus.  However, this was not  clinically  apparent arthroscopically.  After review of the risks and  benefits of the procedure, the patient was taken to the operating room for  the above-noted procedure.   DESCRIPTION OF PROCEDURE:  The patient was brought to the operative theater,  underwent adequate general anesthesia, prepped and draped and antibiotics  were administered, 600 mg clindamycin IV.  The patient was positioned supine  with the right leg in the leg holder.  The patient had an allergy the  penicillin and for this reason, clindamycin was chosen.  Right lower  extremity was then prepped and draped in the sterile fashion.  Starting  inferolateral, superolateral and inferomedial portals were utilized.  Diagnostic evaluation of the knee was carried out revealing the above-noted  findings.  Inferomedial portals were created.  On probe examination, knee  revealed multiple unstable fragments of cartilage on the medial and  lateral  femoral condyles as well as the tear into the mid body of the lateral  meniscus.  Probing of the medial meniscus was unable to detect tear of large  debridement.  Multiple loose bodies were noted within the knee, primarily  within the lateral compartment at this point.  The 3.5 shaver was introduced  into the knee and debridement, abrasion or chondroplasty carried down to the  medial femoral condyle as well as the medial compartment.  This debrided  insignificantly.  Better visualization of the meniscus revealed a relatively  intact meniscus but normal-appearing wavy segment to it.  Lateral  compartment was entered and pituitary rongeur used to remove loose bodies.  A sweep rongeur was used to remove the what was remaining, as well as  performing a partial lateral meniscectomy back to a stable level.  Chondroplasty was carried on with the same technique and an unstable flash  of cartilage present.  The medial and lateral gutters were entered through  the medial and lateral portals respectively as  working portals to debride  the loose fragments of the cartilage in these areas.  The suprapatellar  joint was debrided in the same way, as well as a chondroplasty of the  patellofemoral surface.  Following this, the knee was reexamined and assured  there were no further loose fragments or cartilage. The knee was irrigated  of the remaining fluid and suction and drainage.  The instruments were  removed and portals were reapproximated using 3-0 nylon.  The wound was then  cleaned, dried and dressed sterilely with a sterile, bulky dry dressing.  Note that the patient had an infiltration of 40 mL of 0.25% Marcaine with  epinephrine.  The patient tolerated the procedure well without complications  and was transferred to the recovery room, extubated and __________.                                               Madlyn Frankel Charlann Boxer, M.D.    MDO/MEDQ  D:  10/17/2003  T:  10/17/2003  Job:  [id number]

## 2010-09-11 NOTE — Consult Note (Signed)
NAMEAMNA, WELKER         ACCOUNT NO.:  [id number]   MEDICAL RECORD NO.:  [id number]          PATIENT TYPE:  INP   LOCATION:  2013                         FACILITY:  [facility]   PHYSICIAN:  Salvatore Decent. Cornelius Moras, M.D. DATE OF BIRTH:  February 16, 1928   DATE OF CONSULTATION:  05/14/2004  DATE OF DISCHARGE:                                   CONSULTATION   REQUESTING PHYSICIAN:  Arvilla Meres, M.D.   PRIMARY CARE PHYSICIAN:  Dr. Sherryll Burger.   REASON FOR CONSULTATION:  Severe aortic stenosis, two-vessel coronary artery  disease.   HISTORY OF PRESENT ILLNESS:  Ms. Losano is a 77 year old female from  [place], [place] with known history of aortic stenosis and hypertension.  She has been followed previously with a history of moderate aortic stenosis  documented by a 2D echocardiogram in June, 2004 with transvalvular gradient,  27 mmHg, and estimated valve area of 0.7 cm2 at that time.  The patient  recently has had significant problems with severe degenerative  osteoarthritis afflicting her right knee.  She has been evaluated by Dr.  Durene Romans and has tentative plan to undergo right total knee replacement.  She was referred back to Dr. Gala Romney for preoperative cardiac clearance.  She underwent follow-up echocardiogram in early January, which revealed  progression of aortic stenosis now with peak and mean transvalvular gradient  of 52 and 30 mmHg, respectively, with estimated aortic valve area of 0.7  cm2.  She was noted to have moderate-to-severe left ventricular hypertrophy  but normal left ventricular systolic function.  There was mild aortic  insufficiency.  She was brought in for elective left and right heart  catheterization, which was performed on January 16th by Dr. Antoine Poche.  Findings at the time of catheterization included moderate aortic stenosis as  well as severe two-vessel coronary artery disease.  The mean transvalvular  gradient was measured at 12.5 mmHg with an  estimated aortic valve area of  1.4 cm2.  Pulmonary artery pressures were normal, and the patient's baseline  cardiac output was greater than 5 liters per minute.  She was noted to have  severe two-vessel coronary artery disease.  Initially, some contemplation  was made regarding the possibility of a percutaneous coronary intervention;  however, after considerable subsequent review, cardiac surgical consultation  has been requested to consider elective aortic valve replacement and  coronary artery bypass grafting.   REVIEW OF SYSTEMS:  GENERAL:  Ms. Campillo admits to significant  progressive exertional fatigue, particularly over the last six months.  She  has a good appetite.  She has not been gaining or losing weight.  CARDIAC:  The patient describes symptoms of chest tightness, which she has blamed on  her hiatal hernia.  She states that it seems as though these symptoms are  exacerbated by eating meals, although she also notes that if she exerts  herself strenuously, she will get chest tightness that is usually relieved  by rest.  She reports a sensation of fullness or heaviness across her chest  that seems to come and go sporadically, again, most commonly after meals but  also associated with activity.  The patient  had some exertional shortness of  breath.  She denies resting shortness of breath, PND, orthopnea, or lower  extremity edema.  She has had several dizzy spells, and in fact, has had at  least 3-4 frank syncopal episodes over the last couple of years.  These  have, for the most part, been associated with particular procedures that she  has done, such as when she had her knee aspirated as an outpatient.  She  denies any palpitations.  RESPIRATORY:  Notable only for exertional  shortness of breath.  Patient denies productive cough, hemoptysis, wheezing.  GASTROINTESTINAL:  Notable for tightness and fullness across the chest,  exacerbated by meals.  She associates this with  her hiatal hernia.  She also  reports some intermittent symptoms of reflux.  She denies any significant  difficulty swallowing.  She reports no hematochezia, hematemesis, or melena.  Bowel function is regular.  MUSCULOSKELETAL:  Notable for degenerative  arthritis with particularly severe symptoms and problems related to her  right knee.  The patient also had some arthritis afflicting her left knee as  well as the fingers of both hands.  NEUROLOGICAL:  Negative.  The patient  denies symptoms suggestive of previous TIA or stroke.  HEENT:  Negative.  The patient sees her dentist on a regular basis and gets routine dental  prophylaxis with known history of heart murmur.  GENITOURINARY:  Negative.  PSYCHIATRIC:  Negative.   PAST MEDICAL HISTORY:  1.  Aortic stenosis.  2.  Hypertension.  3.  Severe degenerative arthritis with plan for elective total right knee      replacement in the future.  4.  Hypothyroidism.   PAST SURGICAL HISTORY:  1.  Partial thyroidectomy for benign thyroid nodule.  2.  Cholecystectomy.  3.  Hysterectomy.  4.  Bladder suspension.   SOCIAL HISTORY:  The patient is married and lives with her husband in [place].  She remains active physically, although she has been limited due to severe  arthritis in her right knee.  She is a nonsmoker and denies history of  significant alcohol consumption.   MEDICATIONS PRIOR TO ADMISSION:  1.  Arthrotec 50/0.2 1 tablet twice daily.  2.  Benazepril/hydrochlorothiazide 20/12.5 1 tablet twice daily.  3.  Synthroid 12.5 mcg daily.  4.  Zoloft 50 mg daily.  5.  Nexium 40 mg daily.  6.  Tiazac 180 mg daily.  7.  Calcium supplement daily.  8.  Nasarel twice daily.  9.  Vitamin E.   DRUG ALLERGIES:  PENICILLIN causes swelling.   PHYSICAL EXAMINATION:  VITAL SIGNS:  She has been moderately hypertensive  this admission.  She remains in sinus rhythm.  She is afebrile. GENERAL:  Patient is a well-appearing female who appears her  stated age in  no acute distress.  HEENT:  Grossly unrevealing.  The patient has good dentition.  NECK:  Supple.  There is no cervical or supraclavicular lymphadenopathy.  There is no jugular venous distention.  No carotid bruits are noted.  RESPIRATORY:  Auscultation of the chest reveals clear and symmetric breath  sounds bilaterally.  No wheezes or rhonchi are demonstrated.  CARDIOVASCULAR:  Regular rate and rhythm.  There is a loud grade 3/6 harsh  systolic murmur heard best along the sternal edge with radiation all across  the precordium.  No diastolic murmurs are noted.  ABDOMEN:  Soft and nontender.  There are no palpable masses.  The liver edge  is not enlarged.  Bowel sounds are present.  EXTREMITIES:  Warm and well perfused.  There is no lower extremity edema.  Distal pulses are palpable in both lower legs at the ankle, although mildly  diminished.  RECTAL/GU:  Both deferred.  SKIN:  Clean and dry, healthy-appearing throughout.  NEUROLOGIC:  Grossly nonfocal.   DIAGNOSTIC TESTS:  A 2D echocardiogram performed this hospital admission  demonstrates moderate-to-severe aortic stenosis with mild aortic  insufficiency.  There is moderate-to-severe left ventricular hypertrophy.  There appears to be some impairment of left ventricular relaxation.  There  is normal left ventricular systolic function.  The aortic root appears  relatively small.   Cardiac catheterization confirms the presence of aortic stenosis as well  with hemodynamic data as mentioned previously.  The patient has severe two-  vessel coronary artery disease.  Specifically, there is long-segment 90%  calcified stenosis of the proximal left anterior descending artery.  There  is left dominant coronary circulation.  There is long-segment 80% proximal  stenosis of a large first circumflex marginal branch.  The right coronary  artery is small and nondominant with subtotal occlusion.  Left ventricular  systolic function  appears normal.   IMPRESSION:  Severe aortic stenosis with severe two-vessel coronary artery  disease and normal left ventricular systolic function with symptoms of  exercise-induced as well as post-prandial chest tightness consistent with  angina.  The patient has also had several syncopal episodes and symptoms of  exertional shortness of breath.  I believe that she would best be treated by  elective aortic valve replacement and coronary artery bypass grafting.  Due  to the relatively small size of her aortic root, I suspect a bioprosthetic  tissue valve, such as a stentless porcine valve or possible cryo-preserved  human allograft valve, might be the best choice.  I would not favor  percutaneous coronary intervention as a substitute approach at this point in  time, due to the severity of the patient's underlying aortic stenosis.   PLAN:  I have outlined options at length with Ms. Chisolm and her husband this afternoon.  Alternative treatment strategies have been discussed in  detail.  They understand and accept all of the associated risks of surgery,  including but not limited to, risk of death, stroke, myocardial infarction,  congestive heart failure, respiratory failure, pneumonia, bleeding requiring  blood transfusion, arrhythmia, heart block or bradycardia requiring  permanent pacemaker, recurrent coronary artery disease, late complications  related to bioprosthetic valve.  They understand the rationale for use of a  bioprosthetic tissue valve.  All of their questions have been addressed.  We  tentatively plan to proceed with surgery tomorrow.      Clar   CHO/MEDQ  D:  05/14/2004  T:  05/14/2004  Job:  [id number]   cc:   Arvilla Meres, M.D. [facility]   Dr. Norva Karvonen [facility]  [place], [place]

## 2010-09-11 NOTE — Discharge Summary (Signed)
Erin Herman, Erin Herman NO.:  [id number]   MEDICAL RECORD NO.:  [id number]          PATIENT TYPE:  INP   LOCATION:  2033                         FACILITY:  [facility]   PHYSICIAN:  Arvilla Meres, M.D. [facility] OF BIRTH:  [birth year]   DATE OF ADMISSION:  07/16/2004  DATE OF DISCHARGE:  07/21/2004                           DISCHARGE SUMMARY - REFERRING   SUMMARY OF HISTORY:  Ms. Kelsay is a 77 year old white female who  underwent two-vessel bypass surgery with LIMA to LAD, saphenous vein graft  to the OM and aortic valve placement with a #23 [employer] stent with  bioprosthetics secondary to severe aortic stenosis in January 2006.  Since  being seen in the office on the 21st, she had been complaining of cough,  worsening shortness of breath and increasing weight and has gained seven  pounds.  Despite being placed on low-dose Lasix, she has continued to  complain of fatigue, cough and significant shortness of breath.  Lab work  shows an elevated BMP.  EKG showed new left bundle branch block.  It was  felt that she should be hospitalized for further evaluation.   LABORATORY DATA:  Chest x-ray on July 17, 2004, showed increasing right  effusion, now moderated small left effusion persist to increasing bibasilar  atelectasis. X-ray on the 25th did not show any interval change.  At [facility] admission H&H was 10.3 and 29.7, normal indices,  platelets 281, WBCs 5.8 .  Subsequent hematologies were essentially  unremarkable at the time of discharge.  H&H was 11.4 and 33.2, normal  indices, platelets 341, WBCs 6.9.  Admission PTT was 32 with a PT of 14.6.  Admission sodium was low at 118, potassium 3.2, BUN 10 and creatinine 0.7,  glucose 117, alkaline phosphatase was 153, AST 150, ALT 64, total protein  albumin were also low at 5.4 and 3.2 with a calcium of 8.1, phosphorus was  2.5. Subsequent chemistries showed an improvement in her sodium, on the  24th  it was 123, improving to 128 and potassium was 3.4.  At the time of  discharge, the sodium was 131, potassium 3.2, BUN 12, creatinine 0.7.  Amylase and lipase on admission was 46 and 26. CK-MBs and troponins were  negative x3. Admission BNP was 924.5, at the time of discharge it was 222.4.  TSH on the admission was 0.879. Urinalysis on admission was positive for 15  mg ketones, 30 mg/dL of protein.  Blood culture obtained on admission did  not show any growth.  EKGs showed sinus rhythm, left bundle branch block,  normal axis, PACs.  Subsequent EKGs continue to showed left bundle branch  block. She did have some tachycardia.  Last EKG on the 26th showed normal  sinus rhythm, normal axis, left bundle branch block, ventricular rate 91.   HOSPITAL COURSE:  The patient was admitted to [facility] with diuresis.  By the afternoon the 23rd she was feeling much  better. She was free water restricted for her decreased sodium.  Dr.  Samule Ohm  felt that this was probably chronic secondary to her CHF and  diuretics. by  July 17, 2004, Dr. Andee Lineman noted that the patient stated that she felt much  better with decreased shortness of breath and her nausea had resolved.  He  also noted that the patient had a new 3 to 4+ MR type 3b was severe TR,  question PVH with decreased RV function. Dr. Andee Lineman felt she should undergo  a left and right heart catheterization to reevaluate for coronary artery  disease and valves. Dr. Barry Dienes was notified of her admission.  Fluid  restriction with potassium replacement and diuresis continued.  Case  management also became involved to assist with any discharge needs. Dr. Cornelius Moras  saw the patient and July 17, 2004, and he agreed with plans for  catheterizations once a clear explanation for problems was identified, he  would also favored TEE to reevaluate her valve and LV function.  On July 17, 2004, she underwent right and left heart catheterization.   Please refer  to dictated report.  Dr. Antoine Poche noted that both her grafts were patent and  her aortic valve was not __________.  He felt that she had severe three-  vessel coronary artery disease with patent grafts and mildly increased  pulmonary pressures.  He would consider a TEE Monday per Dr. Andee Lineman.  Dr.  Gala Romney saw the patient on a July 18, 2004.  He felt that the patient now  euvolemic.  He decreased her diuretics, initiated a data blocker and ACE  inhibitor and agreed with TEE to evaluate her mitral regurgitation.  It was  felt that if this is the cause of her decreased LV function, she may need  MVR.  On July 19, 2004,  Dr. Gala Romney stated that the patient felt that  her breathing was slightly worse, but she was able to ambulate without too  much difficulty and she continued to have a nonproductive cough.  TEE was  arranged.  On July 20, 2004, the patient felt that she was not short of  breath, but that she could not get a deep breath in. Dr. Gala Romney felt that  her volume status was stable.  TEE was performed.  Report is not available  to me at the time of this dictation. Apparently Dr. Cornelius Moras and Dr. Gala Romney  reviewed the TEE and felt that her functional MRI was notably worse than it  was last January but it has already become better than it was at the time of  her transthoracic echo last week.  He felt that her LV an RV function was  definitely somewhat decreased, although the reason for this was not entirely  clear.  Given the fact that she has improved so much with medical therapy,  he felt that we should continue with this plan and repeat echocardiogram and  a few months if she remains stable.  If she, however, does poorly with  medical therapy and her MR worsens, surgery could be considered.  Dr.  Gala Romney agreed with this assessment.  On July 21, 2004, Dr. Gala Romney  reassessed the patient.  The patient stated that she felt good, back to baseline, thus Dr.  Gala Romney felt that she could be discharged home. He  recommended continued medical treatment, maintain low dose spironolactone  given her hypokalemia. He will have himself or Dr. Diona Browner follow up in the  next two weeks. We will check BMETs every week for the next two weeks and do  a follow-up echocardiogram in six to eight weeks.  At follow-up appointment,  he would  like titrate up with beta blocker and consider ICD plus/minus BiV  as outpatient.   DISCHARGE DIAGNOSES:  1.  Congestive heart failure secondary to new mitral regurgitation and      decreased left ventricular and right ventricular function.  2.  Hyperkalemia.  3.  Hyponatremia.  4.  Status post two-vessel bypass surgery and aortic valve replacement as      previously described.  5.  Nausea and vomiting.  6.  Elevated liver function tests secondary to #1.  7.  New left bundle branch from.  8.  History of hypothyroidism which appears to be controlled at this time.      History as previously.   DISPOSITION:  The patient is discharged home.   Her new medications include:  1.  Toprol XL 50 mg daily.  2.  Lasix was increased to 40 mg b.i.d.  3.  Spironolactone 12.5 mg daily.  4.  Digoxin 0.125 mg daily.  5.  Benazepril 40 mg daily.  6.  Coated aspirin 325 mg daily.  7.  She was instructed not to take Lopressor, Benazepril,  HCTZ and Cardia      XT.  8.  She was given permission  to continue her Synthroid 125 mcg daily.  9.  K-Dur 20 mEq daily.  10. Zoloft 50 mg daily.  11. Nexium 40 mg daily.  12. Nitroglycerin 0.4 as needed.  13. Nasarel as previously.  14. __________ 50 mg as previously.   She was advised of a low-sodium, low-fat diet, record daily weights, bring  all medicines and all weights to all appointments and report any weight  gain, shortness of breath, cough, swallowing or orthopnea.  She will have a  BMP on Monday, July 27, 2004, and August 03, 2004, at 9:00 a.m. Dr. Gala Romney  does not have an  appointment throughout April, thus she will follow up with  Dr. Diona Browner on August 07, 2004, at 1:00 p.m. and proceed with the previously-  mentioned recommendations in the hospital course narrative.      EW/MEDQ  D:  07/21/2004  T:  07/21/2004  Job:  [id number]   cc:   Kirstie Peri, MD  [address]  [place] [address]  Fax: [phone]

## 2010-09-11 NOTE — Discharge Summary (Signed)
Erin Herman, Erin Herman         ACCOUNT NO.:  [id number]   MEDICAL RECORD NO.:  [id number]          PATIENT TYPE:  INP   LOCATION:  1515                         FACILITY:  [facility]   PHYSICIAN:  Madlyn Frankel. Charlann Boxer, M.D.  DATE OF BIRTH:  [birth year]   DATE OF ADMISSION:  03/23/2005  DATE OF DISCHARGE:  03/27/2005                                 DISCHARGE SUMMARY   ADMISSION DIAGNOSES:  1.  Severe osteoarthritis of the right knee.  2.  Hypertension.  3.  History of coronary artery bypass graft.   DISCHARGE DIAGNOSES:  1.  Severe osteoarthritis of the right knee.  2.  Hypertension.  3.  History of coronary artery bypass graft.  4.  Mild postoperative anemia.  5.  Hyponatremia, resolved.   OPERATION:  On March 23, 2005, the patient underwent right total knee  replacement arthroplasty utilizing DePuy rotating platform posterior  stabilized knee system with posterior stabilized rotating platform  polyethylene liner and patella button.  All three components are cemented.   BRIEF HISTORY:  This 77 year old lady with progressive problems concerning  pain in her right knee secondary to osteoarthritis.  Her x-rays have shown  failure of the normal articulation of the joint secondary to wear with joint  collapse.  Conservative measures had failed.  She was to have had surgery  previously; however, during her preoperative clearance, aortic and mitral  valve insufficiency was noted, and she underwent surgery for that.  She has  been cleared by cardiology for surgical intervention now that she is healed  from those procedures and after all of the risks and benefits were discussed  with the patient by Dr. Charlann Boxer, it was decided to go ahead with the above  procedure.   HOSPITAL COURSE:  The patient tolerated the surgical procedure quite well.  She was placed on Coumadin protocol postoperatively for the prevention of  DVT.  She had some mild hyponatremia postoperatively.  We held the water.  Offered juices and sports drinks.  This brought her sodium up to a normal  level prior to discharge.  She did have some mild postoperative anemia, but  fortunately, we did not have to give transfusion.   The patient worked with physical therapy, who allowed weightbearing as  tolerated.  She did quite well with this.   The wound remained dry without evidence of any infection.  Neurovascular  remained intact to the operative extremity, and the calf remained soft.  It  is felt that the patient could be maintained in her home environment, and  arrangements were made for discharge.   Laboratory values in the hospital hematologically showed preoperative CBC  which showed a mild anemia with a hemoglobin of 11.5, hematocrit 33.4.  Final hemoglobin was 9.9 with a hematocrit of 28.2.  Blood chemistries, as  mentioned above, showed a sodium of 126 on November 15.  We withheld water  and offered juices, and it came up to 131 prior to discharge on March 26, 2005.  Urinalysis negative for urinary tract infection.   The patient did receive 2 units of bank blood for her postoperative anemia.  No chest x-ray  or electrocardiogram seen in this chart.   CONDITION ON DISCHARGE:  Improved and stable.   PLAN:  Patient is discharged to her home in the care of her family.  She  felt much better after her transfusion.  Sodium improved.  Potassium was  3.9.  Hematocrit and hemoglobin 9.9 and 28.2, respectively.  She was  discharged home to continue on her home medication and diet.  Follow up with  Dr. Sherryll Burger, in [place].  We gave copies of the labs to the patient, particularly  concerning her hyponatremia, to carry to Dr. Margaretmary Eddy office.  Her  prescriptions are Vicodin for discomfort, Robaxin as a muscle relaxant,  Coumadin for protocol x4 weeks after the date of surgery, and Trinsicon for  iron supplement.  She will continue with weightbearing as tolerated.  Return  to see Dr. Charlann Boxer in two weeks after the date of  surgery.      Dooley L. Cherlynn June.      Madlyn Frankel Charlann Boxer, M.D.  Electronically Signed    DLU/MEDQ  D:  04/23/2005  T:  04/23/2005  Job:  [id number]

## 2010-09-11 NOTE — Assessment & Plan Note (Signed)
[facility]                              CARDIOLOGY OFFICE NOTE   NAME:Moreland, Erin Herman                MRN:          [id number]  DATE:12/07/2005                            DOB:          [birth year]    PRIMARY CARE PHYSICIAN:  Kirstie Peri, MD.   PATIENT IDENTIFICATION:  Erin Herman is a very pleasant 77 year old woman  who returns today for routine follow-up.   PROBLEM LIST:  For complete problem list, please see my note from June 23, 2005.  1. Coronary artery disease and severe aortic stenosis discovered during      preoperative evaluation for knee replacement.      a.     Status post aortic valve replacement with St. Jude Bioprosthesis       and two vessel coronary artery bypass grafting.  2. Congestive heart failure postoperatively.      a.     Relook catheterization with patent grafts.      b.     Echocardiogram  March 2006, ejection fraction 25-35% with mild       mitral regurgitation.      c.     Echocardiogram  October 2006, ejection fraction 60%.  3. Moderate mitral regurgitation.  4. Osteoarthritis status post knee replacement.  5. Left bundle branch block.  6. Diabetes.  7. Hypertension.  8. Hyperlipidemia.   CURRENT MEDICATIONS:  1. Aldactone 12.5 b.i.d.  2. Zoloft 50 mg a day.  3. Lasix 40 b.i.d.  4. Digoxin 0.125 mg daily.  5. Benazepril 40 mg daily.  6. Aspirin 81 mg daily.  7. Synthroid 125 mcg daily.  8. Toprol 100 mg daily.  9. Prilosec.  10.Potassium 20 mEq b.i.d.  11.Oxybutynin.  12.Lipitor 40 mg daily.  13.Multivitamin.   ALLERGIES:  PENICILLIN and MORPHINE.   INTERVAL HISTORY:  Erin Herman returns for routine follow-up.  She denies  any chest pain or significant shortness of breath.  She continues with  cardiac rehab.  She has not had any significant heart failure.  However, she  does continue to feel quite fatigued.  She is also  having problems sleeping  at night for unknown reasons.  She  denies depression.  She does snore  occasionally but does not have a history of sleep apnea and her husband  denies a witnessed apneic event.   PHYSICAL EXAMINATION:  GENERAL APPEARANCE:  She is well-appearing in no  acute distress.  VITAL SIGNS:  Blood pressure is 112/58, heart rate 57, weight 124.  HEENT:  Sclerae are anicteric.  EOMI.  There is no xanthelasma.  Moist  mucous membranes.  NECK:  Supple.  No JVD.  Carotids are 2+ bilaterally with a soft bruit on  the left.  LUNGS:  Clear.  CARDIOVASCULAR:  She is bradycardic and regular with a 2/6 systolic ejection  murmur at the right sternal border radiating across the precordium.  The S2  is crisp.  There is also a soft mitral regurgitation murmur.  ABDOMEN:  Soft, nontender, nondistended, there is no hepatosplenomegaly, no  bruits, no masses.  EXTREMITIES:  Warm with no clubbing, cyanosis, or edema.  NEUROLOGIC:  She is alert and oriented x3.  Cranial nerves II-XII intact.  She moves all four extremities without difficulty.  Affect was appropriate.   EKG shows sinus bradycardia at a rate of 57 with a left bundle branch block.   ASSESSMENT:  1. Coronary artery disease status post bypass surgery.  This appears      stable with no evidence of ongoing angina.  She is on a very good      medical regimen.  2. Congestive heart failure.  Her cardiomyopathy has resolved.  However,      given her fatigue, we will check another echocardiogram  to make sure      left ventricular function is still preserved.  3. Fatigue.  I think this is somewhat nonspecific but I am concerned that      her bradycardia may be playing a roll.  She says she has significant      difficulty getting her heart rate over 100 during exercise.  Thus, we      will stop her digoxin and change her Toprol to nighttime.  We will also      check routine labs today including a TSH as well as a CBC, BNP and a      digoxin level.  She will follow up with [place] in three months  for routine      follow-up.  4. Hyperlipidemia as per Dr. Sherryll Burger.  Continue Lipitor.  Titrate to keep LDL      below 70.                                Erin Herman. Bensimhon, MD    DRB/MedQ  DD:  12/07/2005  DT:  12/07/2005  Job #:  [id number]   cc:   Kirstie Peri, MD

## 2010-09-11 NOTE — Op Note (Signed)
NAMESARON, VANORMAN NO.:  [id number]   MEDICAL RECORD NO.:  [id number]          PATIENT TYPE:  INP   LOCATION:  2303                         FACILITY:  [facility]   PHYSICIAN:  Bedelia Person, M.D.        DATE OF BIRTH:  Nov 19, 1927   DATE OF PROCEDURE:  05/15/2004  DATE OF DISCHARGE:                                 OPERATIVE REPORT   PROCEDURE:  Transesophageal echocardiogram.   INDICATIONS FOR PROCEDURE:  Erin Herman is an elderly white female with  known coronary artery disease and aortic stenosis.  The TEE will be used to  evaluate the mitral valve, which showed some reflux preoperatively.  Will be  used to evaluate the extent of the aortic valve replacement, as well as  fluid management and left ventricular function status post coronary artery  bypass grafting.  She has no history of esophageal or gastric problems.   DESCRIPTION OF PROCEDURE:  The patient was intubated to secure her airway  after induction of general anesthesia.  Gastric contents were suctioned with  an oral gastric tube, which was then removed.  The transesophageal  transducer was heavily lubricated, placed in a sleeve and passed down the  oropharynx to the 45 cm mark -- remained throughout the procedure to obtain  various Omni Plane views.   Pre-bypass examination revealed the left ventricle to be thickened. There  was, what appeared to be, normal contractility with no segmental defects.  The left atrium was slightly enlarged.  The appendage was clean. Mitral  valve was normal in appearance.  Possibly slightly thickened leaflets; they  opened and closed appropriately, coapted in the valvular plane.   Color Doppler revealed 1+ central regurgitant flow.  The aortic valve had  three leaflets that showed very limited open and closing ability.  There is  possible small remnant of a fourth leaflet.  The leaflets were calcified,  although the annulus appeared relatively free of calcification.   Color  Doppler revealed a mild aortic insufficiency.   The patient was placed in a cardiopulmonary bypass and underwent coronary  artery bypass grafting, as well as replacement of the aortic valve with a  stentless valve.   Post-bypass there were numerous bubbles, which were cleared with a left  ventricular vent.  The left ventricle initially showed marked decreased  contractility; this was especially true of the septal wall.  This improved  with inotropic dopamine and time after disengaging from bypass.  The mitral  valve showed no significant change from the pre-bypass examination.  Stentless aortic valve replacement appeared to be well seated.  There were  no perivalvular leaks or insufficiency noted on Color examination.  Leaflets  all opened and closed appropriately.  The right heart examination was  essentially unchanged from pre-bypass.      LK/MEDQ  D:  05/15/2004  T:  05/16/2004  Job:  [id number]

## 2010-09-11 NOTE — Cardiovascular Report (Signed)
[name], Erin Herman         ACCOUNT NO.:  [id number]   MEDICAL RECORD NO.:  [id number]          PATIENT TYPE:  OIB   LOCATION:  6501                         FACILITY:  [facility]   PHYSICIAN:  Rollene Rotunda, M.D.   DATE OF BIRTH:  January 26, 1928   DATE OF PROCEDURE:  05/11/2004  DATE OF DISCHARGE:                              CARDIAC CATHETERIZATION   PRIMARY CARE PHYSICIAN:  Dr. Clelia Croft   CARDIOLOGIST:  Dr. Gala Romney   PROCEDURES PERFORMED:  1.  Left and right heart catheterization.  2.  Coronary angiography.   INDICATIONS:  Evaluate patient with aortic stenosis, dyspnea on exertion,  chest discomfort.   DETAILS OF THE PROCEDURE:  Left heart catheterization performed via the  right femoral artery, right heart performed via the right femoral vein.  The  vessels were cannulated using anterior wall puncture.  A #4-French arterial  sheath and a #7-French venous sheath were utilized and inserted via the  modified Seldinger technique.  Preformed Judkins, pigtail catheters were  utilized.  The patient had separate ostia to the LAD and circumflex.  The  circumflex was cannulated with Judkins 5.  A Judkins 3.5 was used for the  LAD.  A Swan-Ganz catheter was used.  Patient did have an episode of  bradycardia at the beginning of the case requiring atropine.  She tolerated  the procedure well and left the laboratory in stable condition.   RESULTS:  HEMODYNAMICS:  LV 172/11, AO 156/77.  RA mean 5, RV 19/4, PA 19/5 with a mean of 12,  pulmonary capillary wedge pressure mean 4.  Cardiac output/cardiac index  (5.2/3.1).  Aortic valve area 1.4 sq cm, gradient mean 12.4.   CORONARY ANGIOGRAPHY:  The left main was separate ostia.   The LAD had severe proximal calcification.  There was a long proximal 90%  stenosis.  There were small diagonals.   The circumflex was a large dominant vessel.  It had a proximal 30% stenosis.  There was a very large OM1 with a proximal long 80% stenosis.  Posterolateral 1 was moderate sized and normal.  The PDA was moderate sized  and normal.   The right coronary artery was nondominant and subtotally occluded in the mid  segment.  The EF was 65% and normal.   CONCLUSION:  Severe two vessel coronary artery disease.  Mild aortic  stenosis.   PLAN:  I will review the films with Dr. Gala Romney.  By this study she does  not have moderate or severe aortic stenosis.  She certainly needs  revascularization of the LAD and the circumflex lesions which could be done  percutaneously.  However, we have discrepant data suggesting severe AS.  Will need to clarify this.       JH/MEDQ  D:  05/11/2004  T:  05/11/2004  Job:  [id number]   cc:   Arvilla Meres, M.D. [facility]

## 2010-09-11 NOTE — Cardiovascular Report (Signed)
NAMENIALA, STCHARLES NO.:  [id number]   MEDICAL RECORD NO.:  [id number]          PATIENT TYPE:  INP   LOCATION:  2033                         FACILITY:  [facility]   PHYSICIAN:  Rollene Rotunda, M.D.   DATE OF BIRTH:  December 17, 1927   DATE OF PROCEDURE:  07/17/2004  DATE OF DISCHARGE:                              CARDIAC CATHETERIZATION   PROCEDURE:  Left and right heart catheterization/coronary arteriography.   INDICATIONS FOR PROCEDURE:  A patient with congestive heart failure status  post coronary artery bypass grafting and aortic valve replacement with a St.  Jude bi-prosthetic valve.   DESCRIPTION OF PROCEDURE:  Left heart catheterization was performed via the  right femoral artery, right heart catheterization performed via the right  femoral vein. Both vessels were cannulated using an anterior wall puncture.  A #6 French arterial sheath and #7 French venous sheath were both inserted  via the modified Seldinger technique. Pre-form Judkins and a pigtail  catheter were utilized. The patient tolerated the procedure well and left  the lab in stable condition.   HEMODYNAMIC RESULTS:  __________ mean 8, RV 44/7, PA mean 25, LV not  measured,  AO 33/98, pulmonary capillary wedge pressure mean of 15 with a V-  wave (moderate size). Cardiac output/cardiac index (thermodilution) 5.9/3.5.  Cardiac output/cardiac index (FICK) 5.5/3.2.   CORONARIES:  The left main did not have separate ostia, as previously  assumed. There was 25% proximal stenosis. The LAD had a long proximal 95%  stenosis. The vessel wrapped the apex. There was seen, septal collaterals to  an RV branch. A first diagonal was small with osteal 90% stenosis. A second  diagonal was small and relatively normal. The circumflex was a large  dominant vessel. There was a long proximal 30% stenosis. OM1 was large with  proximal 80% stenosis. Posterolateral was moderate size and normal. The PDA  was moderate size with  a proximal 25% stenosis. The right coronary was not  selectively injected. It appeared to be occluded at the ostium. It was known  previously to be non-dominant with a mid sub-total stenosis. There were  collaterals seen from the septal to the RV branch.   GRAFTS:  The LIMA to the LAD was patent. A saphenous vein graft to the OM1  was widely patent. The LV was not injected and the aortic valve was not  crossed.   CONCLUSION:  Severe 3 vessel coronary disease. Patent grafts. She has a  mildly elevated wedge pressure with mild V-wave. However, the remainder of  her pulmonary pressures are not significantly elevated.   PLAN:  The patient will be considered for TEE to further evaluate her mitral  regurgitation.      JH/MEDQ  D:  07/17/2004  T:  07/19/2004  Job:  [id number]   cc:   Kirstie Peri, MD  [address]  [place] [address]  Fax: [phone]   [facility]   [place] H. Cornelius Moras, M.D.  [address]  [place]  [place] [address]

## 2010-09-11 NOTE — Op Note (Signed)
NAMEOLETHA, TOLSON         ACCOUNT NO.:  [id number]   MEDICAL RECORD NO.:  [id number]          PATIENT TYPE:  INP   LOCATION:  2303                         FACILITY:  [facility]   PHYSICIAN:  Salvatore Decent. Cornelius Moras, M.D. DATE OF BIRTH:  Aug 26, 1927   DATE OF PROCEDURE:  05/15/2004  DATE OF DISCHARGE:                                 OPERATIVE REPORT   PREOPERATIVE DIAGNOSES:  Severe aortic stenosis, two-vessel coronary artery  disease.   POSTOPERATIVE DIAGNOSIS:  Severe aortic stenosis, two-vessel coronary artery  disease.   PROCEDURE:  Median sternotomy for aortic valve replacement (23 mm [place]  stentless porcine valve), and coronary artery bypass grafting x2 (left  internal mammary artery to distal left anterior descending coronary artery,  saphenous vein graft to circumflex marginal branch, endoscopic saphenous  vein harvest from left thigh).   SURGEON:  Dr. Purcell Nails.   ASSISTANT:  Ms. Toniann Fail, P.A.   ANESTHESIA:  General.   BRIEF CLINICAL NOTE:  The patient is a 77 year old female with no previous  history of coronary artery disease but known history of aortic stenosis. She  is suffering from severe degenerative arthritis and plans to have elective  right total knee replacement in the near future. She was referred to Dr.  Arvilla Meres for preoperative cardiac evaluation and clearance. She  underwent follow-up echocardiogram which demonstrates severe aortic stenosis  with estimated aortic valve area of 0.7 cm sq and peak and mean  transvalvular gradient across the aortic valve of 52 and 30 mmHg  respectively. The patient has normal left ventricular systolic function with  moderate to severe left ventricular hypertrophy. There is mild aortic  insufficiency.   The patient subsequently underwent elective cardiac catheterization by Dr.  Rollene Rotunda.  Findings at the time of catheterization includes severe two-  vessel coronary artery disease. There was  moderate aortic stenosis by  catheterization with estimated aortic valve area of 1.4 cm sq. A full  consultation has been dictated previously.   OPERATIVE CONSENT:  The patient and her husband have been counseled at  length regarding the indications and potential benefits of aortic valve  replacement and coronary artery bypass grafting. Alternative treatment  strategies have been discussed in detail.  They understand and accept all  associated risks of surgery and desired to proceed as described.   OPERATIVE FINDINGS:  1.  Quadricuspid native aortic valve with severe aortic stenosis.  2.  Moderate to severe left ventricular hypertrophy with moderate left      ventricular diastolic dysfunction.  3.  Normal left ventricular systolic function.  4.  Diffuse coronary artery disease with numerous hard calcified plaques      throughout the coronary arteries.   OPERATIVE NOTE IN DETAIL:  The patient was brought to the operating room and  above-mentioned date and central monitoring was established by the  anesthesia service under the care and direction of Dr. Bedelia Person.  Specifically, a Swan-Ganz catheter was placed through the right internal  jugular approach. A radial arterial line was placed.  Intravenous  antibiotics were administered. Following induction with general endotracheal  anesthesia, a Foley catheter was placed.  The patient's chest, abdomen, both  groins, and both lower extremities were prepared and draped in sterile  manner.   Baseline transesophageal echocardiogram was performed by Dr. Gypsy Balsam. This  demonstrates severe aortic stenosis. There is mild aortic insufficiency.  There is mild mitral regurgitation. There is moderate left ventricular  hypertrophy. There is normal left ventricular systolic function. No other  significant abnormalities were identified. The aortic annulus and aortic  root are relatively small in diameter.   A median sternotomy incision is performed and  the left internal mammary  artery dissected from the chest wall and prepared for bypass grafting. The  left internal mammary artery is good quality conduit although slightly small  caliber. It has excellent forward flow. Simultaneously, saphenous vein was  obtained from the patient's left thigh using endoscopic vein harvest  technique performed through a small incision made just above the left knee.  The saphenous vein is good quality conduit. After the saphenous vein was  removed from the thigh,  small surgical incisions were closed in multiple  layers with running absorbable suture.   The patient is heparinized systemically.  The pericardium was opened. The  ascending aorta is mildly dilated. It is otherwise normal and notably free  of any palpable plaques or calcifications. The ascending aorta and the right  atrium were cannulated for cardiopulmonary bypass. A retrograde cardioplegic  catheter was placed through the right atrium into the coronary sinus.  Adequate heparinization is verified.   Cardiopulmonary bypass was begun. Left ventricular vent was placed to the  right superior pulmonary vein. The surface of the heart is inspected. There  is a left dominant coronary circulation. There is diffuse coronary artery  disease with hard calcified plaque throughout the epicardial coronary  arteries.  There is moderate left ventricular hypertrophy. No other  abnormalities were noted. Cardioplegic catheter is placed in the ascending  aorta. A temperature probe is placed in the left ventricular septum.   The patient is cooled to 28 degrees systemic temperature. Aortic cross-clamp  was applied and cardioplegia is delivered initially in an antegrade fashion  through the aortic root.  Iced saline slush was applied for topical  hypothermia. Supplemental cardioplegia was administered retrograde through the coronary sinus catheter. The initial cardioplegic arrest and myocardial  cooling are felt  to be excellent. Repeat doses of cardioplegia are  administered intermittently throughout the crossclamp portion of the  operation through the aortic root, down subsequently placed vein graft, and  retrograde to the coronary sinus catheter to maintain septal temperature  below 15 degrees centigrade.   The following distal coronary anastomoses were performed:  1.  The circumflex marginal branch is grafted with a saphenous vein graft in      an end-to-side fashion. This vessel was diffusely diseased but has a      nice lumen that will accept a 1.5 mm probe at the site of distal bypass.      It is of good quality at the site of distal bypass.  2.  The distal left anterior descending coronary artery is grafted with the      left internal mammary artery. This vessel was also diffusely diseased      but a 1.5 probe will easily pass in both directions at the site of      distal bypass.   A transverse aortotomy incision was performed approximately 1.5 cm above the  sinotubular junction. The aortic valve was inspected. The aortic valve was  quadricuspid. The cusps of  the valve are somewhat asymmetrical. The right  coronary cusp appears essentially normal and the right coronary artery is in  the usual position. The remaining 3 cusps are notably asymmetrical with the  left main coronary artery arising somewhat close to the aortic annulus.  The  remaining three cusps are notably somewhat different size with the middle  cuff being smaller than the other two. The left main coronary artery and the  right coronary artery are approximately 120 degrees opposed from each other;  such that in the presence of a normal tricuspid valve, the coronary  arrangement would be normal. The aortic valve was excised sharply. The right  coronary cusp was sent to pathology separately whereas the remaining 3 cuts  were all included together in one piece. The aortic annulus was decalcified.  There is relatively minimal  amount of calcification in the annulus and this  was technically straightforward.   Based upon the coronary arrangement, it appears that use of a stentless  porcine valve will be technically feasible and straightforward despite the  unusual quadricuspid arrangement of the native aortic valve. The aortic  annulus was sized to accept a 23 mm stentless porcine valve. The annulus was  clearly too small to accept even a 19 mm stented pericardial valve. The  aortic root is irrigated with saline solution.   The aortic valve replacement was performed using a 23 mm [employer]  [place] stentless porcine valve (model number  [id number], serial number [id number]). The proximal suture line was  constructed using interrupted simple 4-0 Ethibond sutures placed  circumferentially around the left ventricular outflow tract at the level of  the inferior most portion of the annulus in a single plane, oriented perpendicular to the outflow tract.  After each of these sutures were passed  through the valve and the valve lowered into place, the sutures were secured  and the valve carefully inspected to make sure it is symmetrically opposed.  The distal suture line is now performed using running 4-0 Prolene suture to  attach the 3 pillars of the valve and the associated sewing ring  circumferentially, along each of the 3 sinuses of the Valsalva. The pillar  between the left and noncoronary sinus of the valve is placed approximately  at the midportion of the middle extra scallop of the original quadricuspid  valve.   This places the left main coronary ostium essentially in the middle of the  left sinus of Valsalva with the new tricuspid stentless porcine valve. After  the distal suture lines were completed, the valve was inspected to make sure  it remains competent and that the leaflets remained intact and open and  close appropriately. Rewarming was begun.   Teflon felt strips were utilized to create a  circumferential band around the  left ventricular outflow tract. These were also incorporated in a 2-layer  running closure of the aortotomy which is closed using a  2-layer everting mattress suture.   The single proximal saphenous vein anastomoses was performed directly to the  ascending aorta prior to removal of the aortic crossclamp. The patient is  placed in Trendelenburg position. The left ventricular septal temperature  rises rapidly with reperfusion of the left internal mammary artery. The  lungs were ventilated and the heart allowed to fill. One final dose of warm  retrograde hot shot cardioplegia is administered. All air is evacuated  through the aortic root.  Aortic crossclamp was removed after a total  crossclamp time of 143 minutes.  The heart began to beat spontaneously without need for cardioversion. The  retrograde cardioplegic catheter was removed. A Magoon needle was placed in  the ascending aorta to function as the aortic root vent.. The single  proximal and both distal coronary anastomoses were inspected for hemostasis  and appropriate graft orientation. The left ventricular vent was removed.  Epicardial pacing wires were affixed to the right ventricular outflow tract  and to the right atrial appendage. The patient was rewarmed to 37 degrees  centigrade temperature. The patient is weaned from cardiopulmonary bypass  without difficulty. The patient's rhythm at separation from bypass is a slow  junctional escape rhythm.  AV sequential pacing was employed.   Total cardiopulmonary bypass time for the operation was 175 minutes. The  patient is weaned from bypass on low-dose dopamine at 3 mcg/kg per minute.   Follow-up transesophageal echocardiogram performed by Dr. Gypsy Balsam after  separation from bypass demonstrates normal left ventricular systolic  function. The aortic valve appears normal.  There is no aortic insufficiency. There remains mild (1+) mitral regurgitation.  There is  insignificant residual air.   The venous and arterial cannulae are removed uneventfully as is the Magoon  needle. Protamine was administered to reverse the anticoagulation. The  mediastinum and left chest area are irrigated with saline solution  containing vancomycin. Meticulous surgical hemostasis was ascertained. The  mediastinum and  the left chest are drained using 3 chest tubes plaque  passed through separate stab incisions inferiorly. The median sternotomy was  closed in routine fashion. Soft tissues anterior to the sternum were closed  in multiple layers and the skin was closed with running subcuticular skin  closure.   The patient tolerated the procedure well and was transported to the surgical  intensive care unit in stable condition. There are no intraoperative  complications. All sponge, instrument and needle counts were verified  correct at completion of the operation. The patient was transfused 1 unit  packed red blood cells during cardiopulmonary bypass due to anemia which was  present preoperatively and exacerbated by surgery.      CHO/MEDQ  D:  05/15/2004  T:  05/16/2004  Job:  [id number]   cc:   Arvilla Meres, M.D. [facility]   Molli Hazard D. Charlann Boxer, M.D.  [facility]  [address]  [address]  [place]  [place] [address]  Fax: [phone]

## 2010-09-11 NOTE — Discharge Summary (Signed)
Erin Herman, Erin Herman         ACCOUNT NO.:  [id number]   MEDICAL RECORD NO.:  [id number]          PATIENT TYPE:  INP   LOCATION:  NA                           FACILITY:  [facility]   PHYSICIAN:  Salvatore Decent. Cornelius Moras, M.D. DATE OF BIRTH:  1928-02-04   DATE OF ADMISSION:  05/13/2004  DATE OF DISCHARGE:  05/20/2004                                 DISCHARGE SUMMARY   HISTORY OF PRESENT ILLNESS:  Erin Herman is a 77 year old female from  [place], [place] with a known history of aortic stenosis and  hypertension.  She has been followed previously with a history of moderate  aortic stenosis documented by a 2D echocardiogram in  June 2004 with a transvalvular gradient of 27 mmHg and estimated valve area  of 0.7 cm squared at that time.  She has recently had significant problems  with severe degenerative osteoarthritis affecting her right knee and has  been followed by Dr. Charlann Boxer with tentative plans for total knee replacement.  She was referred to Dr. Gala Romney for preoperative cardiac clearance.  Echocardiogram was undertaken and this revealed progression of the aortic  stenosis now with a peak and mean transvalvular gradient of 52 and 30 mmHg  respectively and an aortic valve area estimated at 0.7 cm squared.  She was  also noted to have moderate to severe left ventricular hypertrophy, but  normal left ventricular systolic function.  There was only mild aortic  insufficiency.  She was felt to require admission for elective left and  right cardiac catheterization and was admitted this hospitalization for the  procedure.   PAST MEDICAL HISTORY:  1.  Aortic stenosis.  2.  Hypertension.  3.  Severe degenerative arthritis of the right knee for elective total      replacement in the future.  4.  Hypothyroidism.   PAST SURGICAL HISTORY:  1.  Partial thyroidectomy for benign thyroid nodule.  2.  Cholecystectomy.  3.  Hysterectomy.  4.  History of bladder suspension.   MEDICATIONS PRIOR  TO ADMISSION:  1.  Arthrotec 50/0.2 1 tablet twice daily.  2.  Benazepril/hydrochlorothiazide 20/12.5 1 tablet twice daily.  3.  Synthroid 12.5 mcg daily.  4.  Zoloft 50 mg daily.  5.  Nexium 40 mg daily.  6.  Tiazac 180 mg daily.  7.  Calcium supplement daily.  8.  Nasarel twice daily.  9.  Vitamin E.   ALLERGIES:  PENICILLIN WHICH CAUSES SWELLING.   FAMILY HISTORY, SOCIAL HISTORY, REVIEW OF SYMPTOMS AND PHYSICAL EXAM:  Please see the History and Physical done at the time of admission.   HOSPITAL COURSE:  The patient was admitted electively for cardiac  catheterization which was done on May 11, 2004 by Dr. Antoine Poche.  Findings included severe two-vessel coronary artery disease and mild aortic  stenosis.  The patient was felt to require further evaluation including  repeat echocardiogram to better delineate the plan.  Upon further evaluation  it was noted that the patients aortic stenosis was in the moderate to severe  range.  Consultation was obtained with Dr. Tressie Stalker for cardiothoracic  surgical opinion.  Dr. Cornelius Moras evaluated the patient  and his studies and  recommended to proceed with aortic valve replacement and coronary artery  bypass grafting.  The patient was stabilized from a medical point of view  and on May 15, 2004 was taken to the operating room and underwent the  following procedures -- coronary artery bypass grafting x2.  The following  grafts were placed:  Left internal mammary artery to the left anterior  descending artery and saphenous vein graft to the obtuse marginal.  The  second procedure was aortic valve replacement with a 23 mm St. Jude  Stentless Bioprosthetic.  The patient tolerated the procedure well and was  taken to the Surgical Intensive Care Unit in stable condition.   POSTOPERATIVE HOSPITAL COURSE:  She has done quite well.  She has maintained  stable hemodynamics.  She was weaned from the ventilator without difficulty.  Chest tube output  was low.  All routine lines, monitors and drainage devices  were discontinued in the standard fashion.  The patient does have a mild  postoperative anemia.  Hemoglobin and hematocrit are stable.  Most recent  measured at 9.6 and 27.5, respectively on May 17, 2004.  Electrolytes,  BUN and creatinine are within normal limits with the exception of a mild  hyponatremia which has stabilized with a sodium of 131.  The patient has  responded well to a gentle diuresis.  She has maintained normal sinus rhythm  with a bundle branch block.  The patients incisions are healing well without  signs of infection.  She has tolerated a routine advancement in activity.  Commensurate for level of postoperative convalescence.  Tentatively the plan  is for discharge in the morning on May 20, 2004 pending morning round  reevaluation.   MEDICATIONS ON DISCHARGE:  1.  Aspirin 325 mg daily.  2.  Lopressor 25 mg twice daily.  3.  Zocor 20 mg daily.  4.  Synthroid 125 mcg daily.  5.  Zoloft 50 mg daily.  6.  Nexium as preoperatively.  7.  She is also to resume her benazepril/HCTZ 20/12.5 twice daily.  8.  For pain Ultram 50 mg 1-2 tablets q.4-6 hours as needed.   INSTRUCTIONS:  The patient received written instructions in regard to  medications, activity, diet, wound care and followup.  Followup will include  Cardiology in two week with Dr. Cornelius Moras, June 22, 2004 at 12:15.   CONDITION ON DISCHARGE:  Stable and improved.   FINAL DIAGNOSES:  1.  Moderate to severe aortic stenosis and severe two-vessel coronary artery      disease, now status post aortic valve replacement with Stentless      Bioprosthetic and coronary artery bypass grafting as described.  2.  Osteoarthritis pending knee replacement in the future.  3.  History of thyroid tumor status post resection in 1987.  4.  Gastroesophageal reflux.  5.  History of hiatal hernia. 6.  History of hypertension.  7.  History of urinary incontinence.   8.  History of previous hysterectomy.      WEG/MEDQ  D:  05/19/2004  T:  05/19/2004  Job:  [id number]   cc:   Salvatore Decent. Cornelius Moras, M.D.  [address]  [place]  [place] [address]   Arvilla Meres, M.D. [facility]   Molli Hazard D. Charlann Boxer, M.D.  [facility]  [address]  [address]  [place]  [place] [address]  Fax: [phone]

## 2010-09-11 NOTE — Assessment & Plan Note (Signed)
[facility]                            CARDIOLOGY OFFICE NOTE   NAME:Erin Herman, Erin Herman                MRN:          [id number]  DATE:06/13/2006                            DOB:          [birth year]    PRIMARY CARE PHYSICIAN:  Dr. Kirstie Peri in [place].   INTERVAL HISTORY:  Erin Herman is a delightful 77 year old woman with  a history of coronary artery disease and severe aortic stenosis, status  post coronary artery bypass grafting and aortic valve replacement with a  St. Jude bioprosthesis.  Post bypass, she did have some significant LV  dysfunction, with an EF of 25-35%, however on most recent echocardiogram  in August 2007, EF was 45-50%, with mild to moderate mitral  regurgitation.   She returns today for routine followup.  She has been feeling well.  She  and her husband just celebrated their 60th wedding anniversary.  She  does have some mild shortness of breath on activity, but much improved.  No lower extremity edema, no orthopnea, no PND, no chest pain.   CURRENT MEDICATIONS:  1. Aldactone 12.5 a day.  2. Lasix 40 b.i.d.  3. Aspirin 81 a day.  4. Synthroid 100 mcg a day.  5. Toprol XL 100 a day.  6. Potassium 20 b.i.d.  7. Lipitor 40 a day.  8. Multivitamin.  9. Digoxin .125 a day.  10.Zoloft 50 a day.  11.Prilosec 20 b.i.d.   PHYSICAL EXAMINATION:  She is well appearing, in no acute distress.  She  ambulates around the clinic without any respiratory difficulty.  Blood  pressure is 106/64, heart rate 60, weight is 127.  HEENT:  Sclerae anicteric, EOMI.  There are no xanthelasmas.  Mucous  membranes are moist.  Oropharynx is clear.  NECK:  Supple, no JVD.  Carotids are 2+ bilaterally with a soft bruit on  the left.  LUNGS:  Clear.  CARDIAC:  She has a regular rate and rhythm, with a soft systolic  ejection murmur at the right sternal border, as well as a soft mitral  regurgitation murmur at the apex.  ABDOMEN:  Soft,  nontender, nondistended.  No hepatosplenomegaly, no  bruits, no mass.  EXTREMITIES:  Warm, with no cyanosis, clubbing or edema, no rash.  NEUROLOGIC:  She is alert and oriented x3.  Cranial nerves II-XII  intact.  She moves all 4 extremities without difficulty.  Affect is  appropriate.   EKG shows normal sinus rhythm with sinus arrhythmia, rate 60.  There is  a chronic left bundle branch block.   ASSESSMENT AND PLAN:  1. Coronary artery disease status post bypass surgery.  She is doing      quite well.  This is very stable.  Continue current regimen.  Most      recent LDL was at 90 and we will go ahead and increase her Lipitor      to 80 mg a day.  2. Congestive heart failure.  Her EF is much improved.  She does have      some residual fatigue but no evidence of volume overload.  We will  continue current therapy.  3. Hyperlipidemia.  Once again, LDL is not to goal.  Titrate Lipitor      to 80.   DISPOSITION:  We will see her back in 6 months for routine followup.     Bevelyn Buckles. Bensimhon, MD  Electronically Signed    DRB/MedQ  DD: 06/13/2006  DT: 06/13/2006  Job #: [id number]   cc:   Kirstie Peri, MD

## 2010-09-11 NOTE — Op Note (Signed)
Erin Herman, Erin Herman         ACCOUNT NO.:  [id number]   MEDICAL RECORD NO.:  [id number]          PATIENT TYPE:  INP   LOCATION:  0002                         FACILITY:  [facility]   PHYSICIAN:  Madlyn Frankel. Charlann Boxer, M.D.  DATE OF BIRTH:  [birth year]   DATE OF PROCEDURE:  03/23/2005  DATE OF DISCHARGE:                                 OPERATIVE REPORT   PREOPERATIVE DIAGNOSES:  End-stage right knee osteoarthritis.   POSTOPERATIVE DIAGNOSES:  End-stage right knee osteoarthritis.   PROCEDURE:  Right total knee replacement.   COMPONENTS USED:  DePuy rotating platform posterior stabilized knee system  with a size 2.5 femur, size 2 tibial tray, size 10 posterior stabilized  rotating platform polyethylene liner and a 32 patella button.   SURGEON:  Madlyn Frankel. Charlann Boxer, M.D.   ASSISTANT:  Cherly Beach, P.A.-C.   ANESTHESIA:  Spinal.   TOURNIQUET TIME:  50 minutes at 250 mmHg.   DRAINS:  Drains x1.   COMPLICATIONS:  None.   INDICATIONS FOR PROCEDURE:  [name] is a pleasant 77 year old female  who I have been following in the office for some time with right knee  osteoarthritis. She had failed conservative measures a long time ago but  unfortunately in her preoperative clearance, she was noted to have aortic  and mitral valve insufficiency and she underwent surgery to repair this. She  subsequently has been cleared from a cardiology standpoint. The risks and  benefits were reviewed at her latest office visit including DVT, infection,  component failure, dislocation, infection. Upon receiving clearance from the  cardiologist, consent was obtained for proceeding with right total knee  arthroplasty.   DESCRIPTION OF PROCEDURE:  The patient was brought to the operative theater.  Once adequate anesthesia and preoperative antibiotics, 1 gram of Ancef, were  administered the patient was positioned supinely with a proximal thigh  tourniquet placed on the right thigh. The right lower  extremity was then  prepped and draped in a sterile fashion. A paramidline incision was made  followed by a medial parapatellar incision.   Knee exposure was obtained in routine fashion. Attention was first directed  to the femur and with the intramedullary guide passed 10 mm of bone was  resected off the distal femur. There was noted to not be a significantly  deficient lateral femoral condyle despite her lateral wear pattern. Upon  cutting the distal femur, I sized the femur to be a size 2.5. The size 2.5  cutting block was then positioned. Anterior, posterior and chamfer cuts were  then made without difficulty. I went ahead at this point and made the box  cuts on the lateral aspect of the medial femoral condyle.   The proximal tibia was then prepared with exposure including meniscectomy  and cruciate ligament excision. I then resected 8 mm of bone off the lateral  aspect of the femur as it appeared to be slightly higher and given the  valgus and elasticity to the knee I felt this would be an adequate cut. Once  this cut was made, the spacer block was positioned with a size 10 with good  ligament balance. At this  point a trial femur was positioned with a size 10  posterior stabilized fixed bearing trial. The knee came to full extension. I  marked rotation on the tibia. Then attention was directed to the patella.  The patella was precut measured at about 19 mm and for that reason, I only  resected down to a 14 to keep 14 mm, the size is to be a 32 patella button.  The patella was positioned and tracked without thumb application of  pressure. The knee was stable on extension and flexion.   Given these parameters, the final component was brought onto the field.   The knee was copiously irrigated with normal saline solution with pulse  lavage. Once this was dried, the knee was injected with 6 mL of Marcaine,  0.5 mL of 1:1000 epi and 30 mg of Toradol. Cement was mixed, the components   were cemented into position with the tibia, femur and a 10 mm posterior  stabilized poly placed. The knee came to full extension and the patella was  clamped. The tourniquet was let down. Excessive cement was removed once.  Once the cement had cured, excessive cement was removed. The knee was  irrigated again and the final 10 posterior stabilized rotating platform poly  in position.   A medium hemovac drain was placed, the wound was again irrigated and  debrided. The extension mechanism was reapproximated using #1 PDS and  flexion. The rest of the wound was closed in layers with skin staples on the  skin based on her thin skin. The patient then had her leg dressed sterilely  with a bulky Jones dressing. She was transferred to the recovery room in  stable condition.      Madlyn Frankel Charlann Boxer, M.D.  Electronically Signed     MDO/MEDQ  D:  03/23/2005  T:  03/23/2005  Job:  [id number]

## 2011-01-27 ENCOUNTER — Telehealth: Payer: Self-pay | Admitting: Internal Medicine

## 2011-01-27 NOTE — Telephone Encounter (Signed)
Pt is due recall and wants to know if there is anywhere she can be seen at?  Please check and call her back.

## 2011-01-27 NOTE — Telephone Encounter (Signed)
LMTCB Debbie Iniko Robles RN  

## 2011-01-28 NOTE — Telephone Encounter (Signed)
Will have Dawn schedule an appt.

## 2011-01-28 NOTE — Telephone Encounter (Signed)
Spoke with Dawn from hrt failure clinic, pt needs app and I will forward to [employer]. Triage/ Alfonso Ramus RN

## 2011-03-22 ENCOUNTER — Ambulatory Visit (HOSPITAL_COMMUNITY)
Admission: RE | Admit: 2011-03-22 | Discharge: 2011-03-22 | Disposition: A | Discharge status: Home / Self Care | Payer: Medicare Other | Source: Ambulatory Visit | Attending: Internal Medicine | Admitting: Internal Medicine

## 2011-03-22 VITALS — BP 104/56 | HR 80 | Wt 144.5 lb

## 2011-03-22 DIAGNOSIS — I2510 Atherosclerotic heart disease of native coronary artery without angina pectoris: Secondary | ICD-10-CM | POA: Insufficient documentation

## 2011-03-22 DIAGNOSIS — E782 Mixed hyperlipidemia: Secondary | ICD-10-CM | POA: Insufficient documentation

## 2011-03-22 DIAGNOSIS — E785 Hyperlipidemia, unspecified: Secondary | ICD-10-CM

## 2011-03-22 DIAGNOSIS — I6529 Occlusion and stenosis of unspecified carotid artery: Secondary | ICD-10-CM | POA: Insufficient documentation

## 2011-03-22 DIAGNOSIS — I10 Essential (primary) hypertension: Secondary | ICD-10-CM

## 2011-03-22 DIAGNOSIS — I359 Nonrheumatic aortic valve disorder, unspecified: Secondary | ICD-10-CM | POA: Insufficient documentation

## 2011-03-22 MED ORDER — FUROSEMIDE 40 MG PO TABS: 40.0000 mg | ORAL_TABLET | Freq: Two times a day (BID) | ORAL | Status: DC

## 2011-03-22 MED ORDER — POTASSIUM CHLORIDE CRYS ER 20 MEQ PO TBCR: 20.0000 meq | EXTENDED_RELEASE_TABLET | Freq: Two times a day (BID) | ORAL | Status: DC

## 2011-03-22 MED ORDER — BENAZEPRIL HCL 40 MG PO TABS: 40.0000 mg | ORAL_TABLET | Freq: Every day | ORAL | Status: DC

## 2011-03-22 MED ORDER — EZETIMIBE 10 MG PO TABS: 10.0000 mg | ORAL_TABLET | Freq: Every day | ORAL | Status: DC

## 2011-03-22 MED ORDER — LOVASTATIN 40 MG PO TABS: 40.0000 mg | ORAL_TABLET | Freq: Every day | ORAL | Status: DC

## 2011-03-22 MED ORDER — METOPROLOL SUCCINATE ER 50 MG PO TB24: ORAL_TABLET | ORAL | Status: DC

## 2011-03-22 MED ORDER — SPIRONOLACTONE 25 MG PO TABS: 12.5000 mg | ORAL_TABLET | Freq: Every day | ORAL | Status: DC

## 2011-03-22 NOTE — Patient Instructions (Signed)
Stop Red Yeast Rice and Start Lovastatin 40 mg daily  Your physician wants you to follow-up in: 6 months. You will receive a reminder letter in the mail two months in advance. If you don't receive a letter, please call our office to schedule the follow-up appointment.

## 2011-03-22 NOTE — Assessment & Plan Note (Signed)
Feels much better off of Lipitor but cholesterol increasing rapidly. Ideally would put her on Crestor but likely cannot afford. Will stop Red Yeast Rice and start lovastatin 40 daily. F/u lipids with PCP in 3 months. Goal LDL < 70.

## 2011-03-22 NOTE — Assessment & Plan Note (Signed)
Blood pressure well controlled. Continue current regimen.  

## 2011-03-22 NOTE — Assessment & Plan Note (Signed)
No evidence of ischemia. Continue current regimen.   

## 2011-03-22 NOTE — Progress Notes (Signed)
HPI:  [name] is a delightful 77 year old woman with a history of coronary artery disease, LBBB and severe aortic stenosis.  She is status post bypass surgery and aortic valve replacement with St. Jude's bioprosthesis in 2006.  Post-operation, she had a drop in her ejection fraction of 25-30% but this is recovered.  EF was 55% in October 2011 with grade 2 diastolic dysfunction  mildly incresed gradient across valve 15 mm HG.  Returns for routine f/u. At last visit was very fatigued and she wanted to try coming off lipitor to see if it helped. We switched her to red yeast rice and she feels much better but TC went from 113 to 198 so now wants to go back on statin. Feels good no cp, sob, edema, pnd, palpitations. Complaint with all meds.    ROS: All systems negative except as listed in HPI, PMH and Problem List.  Past Medical History  Diagnosis Date  . CAD, NATIVE VESSEL 08/05/2008  . HYPERLIPIDEMIA-MIXED 08/03/2008  . HYPERTENSION, BENIGN 08/05/2008  . LBBB (left bundle branch block)   . Diabetes mellitus   . Aortic stenosis     s/p bioprosthetic AVR  . Fatigue     Current Outpatient Prescriptions  Medication Sig Dispense Refill  . aspirin 81 MG tablet Take 81 mg by mouth daily.        . benazepril (LOTENSIN) 40 MG tablet Take 1 tablet (40 mg total) by mouth daily.  90 tablet  3  . ezetimibe (ZETIA) 10 MG tablet Take 1 tablet (10 mg total) by mouth daily.  90 tablet  3  . furosemide (LASIX) 40 MG tablet Take 1 tablet (40 mg total) by mouth 2 (two) times daily.  180 tablet  3  . levothyroxine (SYNTHROID, LEVOTHROID) 88 MCG tablet Take 88 mcg by mouth daily.        . metFORMIN (GLUCOPHAGE) 500 MG tablet Take 500 mg by mouth daily with breakfast.        . metoprolol (TOPROL-XL) 50 MG 24 hr tablet Take 1 tab daily  90 tablet  3  . potassium chloride SA (K-DUR,KLOR-CON) 20 MEQ tablet Take 1 tablet (20 mEq total) by mouth 2 (two) times daily.  180 tablet  3  . Red Yeast Rice 600 MG CAPS  Take 1 capsule by mouth daily.        . sertraline (ZOLOFT) 100 MG tablet Take 50 mg by mouth daily.        Marland Kitchen spironolactone (ALDACTONE) 25 MG tablet Take 0.5 tablets (12.5 mg total) by mouth daily.  90 tablet  3     PHYSICAL EXAM: Filed Vitals:   03/22/11 1107  BP: 104/56  Pulse: 80   General:  Well appearing. No resp difficulty HEENT: normal Neck: supple. JVP flat. Carotids 2+ bilaterally; no bruits. No lymphadenopathy or thryomegaly appreciated. Cor: PMI normal. Regular rate & rhythm. No rubs, gallops or murmurs. S2 crisp Lungs: clear Abdomen: soft, nontender, nondistended. No hepatosplenomegaly. No bruits or masses. Good bowel sounds. Extremities: no cyanosis, clubbing, rash, edema Neuro: alert & orientedx3, cranial nerves grossly intact. Moves all 4 extremities w/o difficulty. Affect pleasant.     ASSESSMENT & PLAN:

## 2011-03-22 NOTE — Assessment & Plan Note (Signed)
S/p AVR. Doing well. Using SBE prophylaxis. Will need f/u echo next year.

## 2011-08-17 ENCOUNTER — Ambulatory Visit (HOSPITAL_COMMUNITY)
Admission: RE | Admit: 2011-08-17 | Discharge: 2011-08-17 | Disposition: A | Discharge status: Home / Self Care | Payer: Medicare Other | Source: Ambulatory Visit | Attending: Internal Medicine | Admitting: Internal Medicine

## 2011-08-17 VITALS — BP 138/60 | HR 83 | Wt 146.5 lb

## 2011-08-17 DIAGNOSIS — I509 Heart failure, unspecified: Secondary | ICD-10-CM | POA: Insufficient documentation

## 2011-08-17 DIAGNOSIS — I519 Heart disease, unspecified: Secondary | ICD-10-CM

## 2011-08-17 DIAGNOSIS — I2510 Atherosclerotic heart disease of native coronary artery without angina pectoris: Secondary | ICD-10-CM

## 2011-08-17 DIAGNOSIS — E785 Hyperlipidemia, unspecified: Secondary | ICD-10-CM

## 2011-08-17 DIAGNOSIS — I359 Nonrheumatic aortic valve disorder, unspecified: Secondary | ICD-10-CM

## 2011-08-17 LAB — BASIC METABOLIC PANEL
Chloride: 96 mEq/L (ref 96–112)
GFR calc Af Amer: 47 mL/min — ABNORMAL LOW (ref >90)
GFR calc non Af Amer: 41 mL/min — ABNORMAL LOW (ref >90)
Potassium: 5.5 mEq/L — ABNORMAL HIGH (ref 3.5–5.1)
Sodium: 137 mEq/L (ref 135–145)

## 2011-08-17 NOTE — Assessment & Plan Note (Addendum)
Ideally, goal LDL < 70. However, she can not afford zetia and is intolerant to statins, have instructed her to increase red yeast rice to BID.

## 2011-08-17 NOTE — Patient Instructions (Signed)
Can try extra dose of lasix in the morning when weight goes up 2-3 pounds in 24 hours.  Watch fluid intake, only drink when you are thirsty.  Labs today.  Your physician has requested that you have an echocardiogram. Echocardiography is a painless test that uses sound waves to create images of your heart. It provides your doctor with information about the size and shape of your heart and how well your heart's chambers and valves are working. This procedure takes approximately one hour. There are no restrictions for this procedure.  Follow up with Erin Herman in 3 months.  Do the following things EVERYDAY: 1) Weigh yourself in the morning before breakfast. Write it down and keep it in a log. 2) Take your medicines as prescribed 3) Eat low salt foods--Limit salt (sodium) to 2000mg  per day.  4) Stay as active as you can everyday 5) Keep fluids less than 2 liters per day.

## 2011-08-17 NOTE — Assessment & Plan Note (Addendum)
Currently volume status looks stable. Reinforced need for daily weights and reviewed use of sliding scale diuretics. Will have her take an extra lasix 40 mg on the mornings her weight is up 3 pounds.  Will recheck echo. Check BNP/BMET today.

## 2011-08-29 NOTE — Progress Notes (Signed)
HPI:  Ms. Amundson is a delightful 78 year old woman with a history of coronary artery disease, LBBB and severe aortic stenosis.  She is status post bypass surgery and aortic valve replacement with St. Jude's bioprosthesis in 2006.  Post-operation, she had a drop in her ejection fraction of 25-30% but this is recovered.  EF was 55% in October 2011 with grade 2 diastolic dysfunction  mildly incresed gradient across valve 15 mm HG.  She returns for follow up today.  Increased swelling in hands, knees, abdomen.  She has gained weight.  This has been occuring for 2-3 months.  Feels it is worse when it gets warm, she feels it is a little better.  Drinks a lot of water all the time.  Feels breathing is a little worse during these times.  No orthopnea/PND.  No chest pain.  No dizziness/syncope.  +cough  05/2011: TC 148, HDL 36, TRG 133, LDL 85, ALT 19, AST 27  ROS: All systems negative except as listed in HPI, PMH and Problem List.  Past Medical History  Diagnosis Date  . CAD, NATIVE VESSEL 08/05/2008  . HYPERLIPIDEMIA-MIXED 08/03/2008  . HYPERTENSION, BENIGN 08/05/2008  . LBBB (left bundle branch block)   . Diabetes mellitus   . Aortic stenosis     s/p bioprosthetic AVR  . Fatigue     Current Outpatient Prescriptions  Medication Sig Dispense Refill  . aspirin 81 MG tablet Take 81 mg by mouth daily.        . benazepril (LOTENSIN) 40 MG tablet Take 1 tablet (40 mg total) by mouth daily.  90 tablet  3  . furosemide (LASIX) 40 MG tablet Take 1 tablet (40 mg total) by mouth 2 (two) times daily.  180 tablet  3  . levothyroxine (SYNTHROID, LEVOTHROID) 88 MCG tablet Take 88 mcg by mouth daily.        . metFORMIN (GLUCOPHAGE) 500 MG tablet Take 500 mg by mouth daily with breakfast.        . metoprolol (TOPROL-XL) 50 MG 24 hr tablet Take 1 tab daily  90 tablet  3  . potassium chloride SA (K-DUR,KLOR-CON) 20 MEQ tablet Take 1 tablet (20 mEq total) by mouth 2 (two) times daily.  180 tablet  3  . Red Yeast  Rice 600 MG CAPS Take 1 capsule by mouth daily.      . sertraline (ZOLOFT) 100 MG tablet Take 50 mg by mouth daily.        Marland Kitchen spironolactone (ALDACTONE) 25 MG tablet Take 0.5 tablets (12.5 mg total) by mouth daily.  90 tablet  3     PHYSICAL EXAM: Filed Vitals:   08/17/11 0922  BP: 138/60  Pulse: 83  Weight: 146 lb 8 oz (66.452 kg)  SpO2: 96%   General:  Elderly, Well appearing. No resp difficulty HEENT: normal Neck: supple. JVP flat. Carotids 2+ bilaterally; no bruits. No lymphadenopathy or thryomegaly appreciated. Cor: PMI normal. Regular rate & rhythm. No rubs, gallops or murmurs. S2 crisp Lungs: clear Abdomen: soft, nontender, nondistended. No hepatosplenomegaly. No bruits or masses. Good bowel sounds. Extremities: no cyanosis, clubbing, rash, edema, rt knee + effusion Neuro: alert & orientedx3, cranial nerves grossly intact. Moves all 4 extremities w/o difficulty. Affect pleasant.     ASSESSMENT & PLAN:

## 2011-08-29 NOTE — Assessment & Plan Note (Signed)
S/p AVR. Doing well.

## 2011-08-29 NOTE — Assessment & Plan Note (Signed)
No evidence of ischemia. Continue current regimen.   

## 2011-09-02 NOTE — Progress Notes (Signed)
Encounter addended by: Noralee Space, RN on: 09/02/2011 10:41 AM<BR>     Documentation filed: Orders

## 2011-09-03 ENCOUNTER — Telehealth (HOSPITAL_COMMUNITY): Payer: Self-pay | Admitting: *Deleted

## 2011-09-03 NOTE — Telephone Encounter (Signed)
Erin Herman called today in regards to her recent lab work she had done in [place]. She would like a call back.  Thanks.

## 2011-09-03 NOTE — Telephone Encounter (Signed)
Pt had labs last Friday and hasn't heard anything, advised I had not received would call and get them faxed to me

## 2011-09-07 ENCOUNTER — Ambulatory Visit (HOSPITAL_COMMUNITY)
Admission: RE | Admit: 2011-09-07 | Discharge: 2011-09-07 | Disposition: A | Discharge status: Home / Self Care | Payer: Medicare Other | Source: Ambulatory Visit | Attending: Internal Medicine | Admitting: Internal Medicine

## 2011-09-07 DIAGNOSIS — Z941 Heart transplant status: Secondary | ICD-10-CM | POA: Insufficient documentation

## 2011-09-07 DIAGNOSIS — I447 Left bundle-branch block, unspecified: Secondary | ICD-10-CM | POA: Insufficient documentation

## 2011-09-07 DIAGNOSIS — E119 Type 2 diabetes mellitus without complications: Secondary | ICD-10-CM | POA: Insufficient documentation

## 2011-09-07 DIAGNOSIS — I059 Rheumatic mitral valve disease, unspecified: Secondary | ICD-10-CM

## 2011-09-07 DIAGNOSIS — E785 Hyperlipidemia, unspecified: Secondary | ICD-10-CM | POA: Insufficient documentation

## 2011-09-07 DIAGNOSIS — I10 Essential (primary) hypertension: Secondary | ICD-10-CM | POA: Insufficient documentation

## 2011-09-07 DIAGNOSIS — I519 Heart disease, unspecified: Secondary | ICD-10-CM

## 2011-09-07 DIAGNOSIS — I2510 Atherosclerotic heart disease of native coronary artery without angina pectoris: Secondary | ICD-10-CM | POA: Insufficient documentation

## 2011-09-07 NOTE — Progress Notes (Signed)
*  PRELIMINARY RESULTS* Echocardiogram 2D Echocardiogram has been performed.  Jeryl Columbia R 09/07/2011, 10:04 AM

## 2011-09-07 NOTE — Telephone Encounter (Signed)
Finally received pt's labs K better at 4.0 attempted to call pt and Left message to call back

## 2011-09-07 NOTE — Telephone Encounter (Signed)
Pt aware of lab results 

## 2011-09-14 ENCOUNTER — Encounter (HOSPITAL_COMMUNITY): Payer: Self-pay

## 2011-12-24 ENCOUNTER — Other Ambulatory Visit: Payer: Self-pay | Admitting: *Deleted

## 2011-12-24 DIAGNOSIS — I6529 Occlusion and stenosis of unspecified carotid artery: Secondary | ICD-10-CM

## 2011-12-29 ENCOUNTER — Encounter (INDEPENDENT_AMBULATORY_CARE_PROVIDER_SITE_OTHER): Payer: Medicare Other

## 2011-12-29 DIAGNOSIS — I6529 Occlusion and stenosis of unspecified carotid artery: Secondary | ICD-10-CM

## 2012-03-27 ENCOUNTER — Other Ambulatory Visit (HOSPITAL_COMMUNITY): Payer: Self-pay | Admitting: *Deleted

## 2012-03-27 MED ORDER — FUROSEMIDE 40 MG PO TABS: 40.0000 mg | ORAL_TABLET | Freq: Two times a day (BID) | ORAL | Status: DC

## 2012-03-27 MED ORDER — SPIRONOLACTONE 25 MG PO TABS: 12.5000 mg | ORAL_TABLET | Freq: Every day | ORAL | Status: DC

## 2012-06-15 ENCOUNTER — Encounter (HOSPITAL_COMMUNITY): Payer: Medicare Other

## 2012-06-26 ENCOUNTER — Encounter (HOSPITAL_COMMUNITY): Payer: Self-pay

## 2012-06-26 ENCOUNTER — Ambulatory Visit (HOSPITAL_COMMUNITY)
Admission: RE | Admit: 2012-06-26 | Discharge: 2012-06-26 | Disposition: A | Discharge status: Home / Self Care | Payer: Medicare Other | Source: Ambulatory Visit | Attending: Internal Medicine | Admitting: Internal Medicine

## 2012-06-26 VITALS — BP 124/72 | HR 76 | Wt 148.0 lb

## 2012-06-26 DIAGNOSIS — E782 Mixed hyperlipidemia: Secondary | ICD-10-CM | POA: Insufficient documentation

## 2012-06-26 DIAGNOSIS — I5032 Chronic diastolic (congestive) heart failure: Secondary | ICD-10-CM

## 2012-06-26 DIAGNOSIS — E785 Hyperlipidemia, unspecified: Secondary | ICD-10-CM

## 2012-06-26 DIAGNOSIS — I10 Essential (primary) hypertension: Secondary | ICD-10-CM | POA: Insufficient documentation

## 2012-06-26 DIAGNOSIS — I2510 Atherosclerotic heart disease of native coronary artery without angina pectoris: Secondary | ICD-10-CM

## 2012-06-26 MED ORDER — METOPROLOL SUCCINATE ER 50 MG PO TB24: ORAL_TABLET | ORAL | Status: DC

## 2012-06-26 MED ORDER — SPIRONOLACTONE 25 MG PO TABS: 12.5000 mg | ORAL_TABLET | Freq: Every day | ORAL | Status: DC

## 2012-06-26 MED ORDER — BENAZEPRIL HCL 40 MG PO TABS: 40.0000 mg | ORAL_TABLET | Freq: Every day | ORAL | Status: DC

## 2012-06-26 NOTE — Patient Instructions (Signed)
Follow up in 1 year   Do the following things EVERYDAY: 1) Weigh yourself in the morning before breakfast. Write it down and keep it in a log. 2) Take your medicines as prescribed 3) Eat low salt foods-Limit salt (sodium) to 2000 mg per day.  4) Stay as active as you can everyday 5) Limit all fluids for the day to less than 2 liters 

## 2012-06-26 NOTE — Assessment & Plan Note (Addendum)
Per PCP. Back on atorvastatin.   Attending: Agree.

## 2012-06-26 NOTE — Assessment & Plan Note (Addendum)
Stable continue current regimen.   Attending: Agree.

## 2012-06-26 NOTE — Progress Notes (Signed)
Patient ID: Erin Herman, female   DOB: 07-Jan-1928, 79 y.o.   MRN: [id number] PCP: Dr Clelia Croft  HPI:  Erin Herman is a delightful 79 year old woman with a history of coronary artery disease, LBBB and severe aortic stenosis.  She is status post bypass surgery and aortic valve replacement with St. Jude's bioprosthesis in 2006.  Post-operation, she had a drop in her ejection fraction of 25-30% but this is recovered.  EF was 55% in October 2011 with grade 2 diastolic dysfunction  mildly incresed gradient across valve 15 mm HG.  She returns for follow up today.  Exertional SOB which persists. Denies PND/Orthopnea. Weight at home 147 pounds. Complains of lower extremity edema. PCP tried red yeast  rice without success and she placed on atorvastatin in November. Unable to exercise. Uses a cane to walk.   05/2011: TC 148, HDL 36, TRG 133, LDL 85, ALT 19, AST 27 01/2012 TC 195 HDL 46 TRG 123 LDL 124 ALT 13 AST 29 Creatinine 1.34 BUN 42 08/2011 ECHO 55% Grade 2 diastolic HF  ROS: All systems negative except as listed in HPI, PMH and Problem List.  Past Medical History  Diagnosis Date  . CAD, NATIVE VESSEL 08/05/2008  . HYPERLIPIDEMIA-MIXED 08/03/2008  . HYPERTENSION, BENIGN 08/05/2008  . LBBB (left bundle branch block)   . Diabetes mellitus   . Aortic stenosis     s/p bioprosthetic AVR  . Fatigue     Current Outpatient Prescriptions  Medication Sig Dispense Refill  . aspirin 81 MG tablet Take 81 mg by mouth daily.        . benazepril (LOTENSIN) 40 MG tablet Take 1 tablet (40 mg total) by mouth daily.  90 tablet  3  . furosemide (LASIX) 40 MG tablet Take 1 tablet (40 mg total) by mouth 2 (two) times daily.  180 tablet  3  . levothyroxine (SYNTHROID, LEVOTHROID) 88 MCG tablet Take 88 mcg by mouth daily.        . metFORMIN (GLUCOPHAGE) 500 MG tablet Take 500 mg by mouth daily with breakfast.        . metoprolol (TOPROL-XL) 50 MG 24 hr tablet Take 1 tab daily  90 tablet  3  . sertraline  (ZOLOFT) 100 MG tablet Take 50 mg by mouth daily.        Marland Kitchen spironolactone (ALDACTONE) 25 MG tablet Take 0.5 tablets (12.5 mg total) by mouth daily.  90 tablet  3  . potassium chloride SA (K-DUR,KLOR-CON) 20 MEQ tablet Take 1 tablet (20 mEq total) by mouth 2 (two) times daily.  180 tablet  3  . Red Yeast Rice 600 MG CAPS Take 1 capsule by mouth daily.       No current facility-administered medications for this encounter.     PHYSICAL EXAM: Filed Vitals:   06/26/12 0949  BP: 124/72  Pulse: 76  Weight: 148 lb (67.132 kg)  SpO2: 98%   General:  Elderly, Well appearing. No resp difficulty HEENT: normal Neck: supple. JVP flat. Carotids 2+ bilaterally; no bruits. No lymphadenopathy or thryomegaly appreciated. Cor: PMI normal. Regular rate & rhythm. No rubs, gallops or murmurs. S2 crisp Lungs: clear Abdomen: soft, nontender, nondistended. No hepatosplenomegaly. No bruits or masses. Good bowel sounds. Extremities: no cyanosis, clubbing, rash, edema, rt knee + effusion Neuro: alert & orientedx3, cranial nerves grossly intact. Moves all 4 extremities w/o difficulty. Affect pleasant.     ASSESSMENT & PLAN:

## 2012-07-03 DIAGNOSIS — I5032 Chronic diastolic (congestive) heart failure: Secondary | ICD-10-CM | POA: Insufficient documentation

## 2012-07-03 NOTE — Assessment & Plan Note (Signed)
Patient seen and examined with Tonye Becket, NP. We discussed all aspects of the encounter. I agree with the assessment as stated above.   No evidence of ischemia. Continue current regimen.

## 2012-07-03 NOTE — Assessment & Plan Note (Signed)
Attending: Volume status looks good. Continue current regimen.

## 2012-07-19 ENCOUNTER — Encounter: Payer: Self-pay | Admitting: Internal Medicine

## 2013-01-09 ENCOUNTER — Ambulatory Visit (INDEPENDENT_AMBULATORY_CARE_PROVIDER_SITE_OTHER): Payer: Medicare Other | Admitting: Urology

## 2013-01-09 DIAGNOSIS — R339 Retention of urine, unspecified: Secondary | ICD-10-CM

## 2013-01-09 DIAGNOSIS — N3020 Other chronic cystitis without hematuria: Secondary | ICD-10-CM

## 2013-01-23 ENCOUNTER — Ambulatory Visit (INDEPENDENT_AMBULATORY_CARE_PROVIDER_SITE_OTHER): Payer: Medicare Other | Admitting: Urology

## 2013-01-23 DIAGNOSIS — R339 Retention of urine, unspecified: Secondary | ICD-10-CM

## 2013-01-23 DIAGNOSIS — N312 Flaccid neuropathic bladder, not elsewhere classified: Secondary | ICD-10-CM

## 2013-02-13 ENCOUNTER — Ambulatory Visit (INDEPENDENT_AMBULATORY_CARE_PROVIDER_SITE_OTHER): Payer: Medicare Other | Admitting: Urology

## 2013-02-13 ENCOUNTER — Encounter (INDEPENDENT_AMBULATORY_CARE_PROVIDER_SITE_OTHER): Payer: Self-pay

## 2013-02-13 DIAGNOSIS — N3030 Trigonitis without hematuria: Secondary | ICD-10-CM

## 2013-02-13 DIAGNOSIS — N8111 Cystocele, midline: Secondary | ICD-10-CM

## 2013-02-13 DIAGNOSIS — N312 Flaccid neuropathic bladder, not elsewhere classified: Secondary | ICD-10-CM

## 2013-03-05 ENCOUNTER — Encounter: Payer: Self-pay | Admitting: Obstetrics & Gynecology

## 2013-03-05 ENCOUNTER — Ambulatory Visit (INDEPENDENT_AMBULATORY_CARE_PROVIDER_SITE_OTHER): Payer: Medicare Other | Admitting: Obstetrics & Gynecology

## 2013-03-05 VITALS — BP 90/60 | Ht 62.0 in | Wt 147.0 lb

## 2013-03-05 DIAGNOSIS — N993 Prolapse of vaginal vault after hysterectomy: Secondary | ICD-10-CM

## 2013-03-05 NOTE — Progress Notes (Signed)
Patient ID: Erin Herman, female   DOB: Aug 17, 1927, 79 y.o.   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 CAD, NATIVE VESSEL 08/05/2008  . HYPERLIPIDEMIA-MIXED 08/03/2008  . HYPERTENSION, BENIGN 08/05/2008  . LBBB (left bundle branch block)   . Diabetes mellitus   . Aortic stenosis     s/p bioprosthetic AVR  . Fatigue     Past Surgical History  Procedure Laterality Date  . Total knee arthroplasty    . Median sternotomy      OB History   Grav Para Term Preterm Abortions TAB SAB Ect Mult Living                  Allergies  Allergen Reactions  . Morphine   . Penicillins     History   Social History  . Marital Status: Married    Spouse Name: N/A    Number of Children: N/A  . Years of Education: N/A   Social History Main Topics  . Smoking status: Never Smoker   . Smokeless tobacco: None  . Alcohol Use: None  . Drug Use: None  . Sexual Activity: None   Other Topics Concern  . None   Social History Narrative  . None    Family History  Problem Relation Age of Onset  . Heart Problems Mother   . Diabetes Father   . Cirrhosis Father   . Diabetes Other

## 2013-03-13 ENCOUNTER — Ambulatory Visit (INDEPENDENT_AMBULATORY_CARE_PROVIDER_SITE_OTHER): Payer: Medicare Other | Admitting: Obstetrics & Gynecology

## 2013-03-13 ENCOUNTER — Encounter: Payer: Self-pay | Admitting: Obstetrics & Gynecology

## 2013-03-13 VITALS — BP 120/54 | Wt 148.0 lb

## 2013-03-13 DIAGNOSIS — N993 Prolapse of vaginal vault after hysterectomy: Secondary | ICD-10-CM

## 2013-03-13 NOTE — Progress Notes (Signed)
Patient ID: Erin Herman, female   DOB: Dec 30, 1927, 79 y.o.   MRN: [id number] Previous note: Pt status psot hysterectomy and 2 "bladder tacks"  Exam reveals a vaginall apex vault prolapse, Grade III, with additional technical problem of a foreshortened and narrow vaginal from the previous vaginal repair surgery  Difficult to fit for a pessary due to this fact  As a result optimal pessary to use would be a Gelhorn but it is too long  I am going to try a milex ring with support #2(57 mm)    03/13/2013 The pessary is placed agin sub optimal due to short vagina s/p surgeries, hopeful the pessary will stay in  Follow up in 1 month

## 2013-03-19 ENCOUNTER — Ambulatory Visit: Payer: Medicare Other | Admitting: Obstetrics & Gynecology

## 2013-03-26 ENCOUNTER — Encounter: Payer: Self-pay | Admitting: Obstetrics & Gynecology

## 2013-03-26 ENCOUNTER — Encounter (INDEPENDENT_AMBULATORY_CARE_PROVIDER_SITE_OTHER): Payer: Medicare Other | Admitting: Obstetrics & Gynecology

## 2013-03-27 NOTE — Progress Notes (Signed)
This encounter was created in error - please disregard.

## 2013-04-02 ENCOUNTER — Telehealth: Payer: Self-pay | Admitting: Obstetrics & Gynecology

## 2013-04-03 NOTE — Telephone Encounter (Signed)
Pt informed have received pessary, called transferred to front staff for an appt to be made.

## 2013-04-10 ENCOUNTER — Encounter: Payer: Self-pay | Admitting: Obstetrics & Gynecology

## 2013-04-10 ENCOUNTER — Ambulatory Visit (INDEPENDENT_AMBULATORY_CARE_PROVIDER_SITE_OTHER): Payer: Medicare Other | Admitting: Obstetrics & Gynecology

## 2013-04-10 VITALS — BP 110/60 | Wt 148.0 lb

## 2013-04-10 DIAGNOSIS — Z1389 Encounter for screening for other disorder: Secondary | ICD-10-CM

## 2013-04-10 DIAGNOSIS — Z87440 Personal history of urinary (tract) infections: Secondary | ICD-10-CM

## 2013-04-10 DIAGNOSIS — N993 Prolapse of vaginal vault after hysterectomy: Secondary | ICD-10-CM

## 2013-04-10 LAB — POCT URINALYSIS DIPSTICK
Leukocytes, UA: NEGATIVE
Nitrite, UA: NEGATIVE
Protein, UA: NEGATIVE

## 2013-04-10 NOTE — Progress Notes (Signed)
Patient ID: Erin Herman, female   DOB: [birth year], 79 y.o.   MRN: [id number] Patient Active Problem List   Diagnosis Date Noted  . Prolapse of vaginal vault after hysterectomy 03/05/2013  . Chronic diastolic heart failure 07/03/2012  . FATIGUE / MALAISE 02/02/2010  . CAROTID ARTERY DISEASE 12/09/2009  . AV BLOCK, 1ST DEGREE 02/20/2009  . DIASTOLIC DYSFUNCTION 02/20/2009  . HYPERTENSION, BENIGN 08/05/2008  . CAD, NATIVE VESSEL 08/05/2008  . CARDIOMYOPATHY, ISCHEMIC 08/05/2008  . AORTIC STENOSIS/ INSUFFICIENCY, NON-RHEUMATIC 08/05/2008  . HYPERLIPIDEMIA-MIXED 08/03/2008   See previous visit's notes Pt with Grade III vaginal vault POP  Fitted with #1 milex ring with support Have concerns about staying and incontinence  Follow up in 1 month

## 2013-04-10 NOTE — Addendum Note (Signed)
Addended by: Criss Alvine on: 04/10/2013 10:59 AM   Modules accepted: Orders

## 2013-05-15 ENCOUNTER — Encounter: Payer: Self-pay | Admitting: Obstetrics & Gynecology

## 2013-05-15 ENCOUNTER — Ambulatory Visit (INDEPENDENT_AMBULATORY_CARE_PROVIDER_SITE_OTHER): Payer: Medicare Other | Admitting: Obstetrics & Gynecology

## 2013-05-15 VITALS — BP 104/60 | Wt 150.0 lb

## 2013-05-15 DIAGNOSIS — N993 Prolapse of vaginal vault after hysterectomy: Secondary | ICD-10-CM

## 2013-05-15 NOTE — Progress Notes (Signed)
Patient ID: Erin Herman, female   DOB: 1927-06-01, 80 y.o.   MRN: [id number] 2.5 inch placed and is staying foolw up 1 month  Past Medical History  Diagnosis Date  . CAD, NATIVE VESSEL 08/05/2008  . HYPERLIPIDEMIA-MIXED 08/03/2008  . HYPERTENSION, BENIGN 08/05/2008  . LBBB (left bundle branch block)   . Diabetes mellitus   . Aortic stenosis     s/p bioprosthetic AVR  . Fatigue     Past Surgical History  Procedure Laterality Date  . Total knee arthroplasty    . Median sternotomy      OB History   Grav Para Term Preterm Abortions TAB SAB Ect Mult Living                  Allergies  Allergen Reactions  . Morphine   . Penicillins     History   Social History  . Marital Status: Married    Spouse Name: N/A    Number of Children: N/A  . Years of Education: N/A   Social History Main Topics  . Smoking status: Never Smoker   . Smokeless tobacco: Not on file  . Alcohol Use: Not on file  . Drug Use: Not on file  . Sexual Activity: Not Currently   Other Topics Concern  . Not on file   Social History Narrative  . No narrative on file    Family History  Problem Relation Age of Onset  . Heart Problems Mother   . Diabetes Father   . Cirrhosis Father   . Diabetes Other

## 2013-05-17 ENCOUNTER — Encounter: Payer: Self-pay | Admitting: Obstetrics & Gynecology

## 2013-05-17 ENCOUNTER — Ambulatory Visit (INDEPENDENT_AMBULATORY_CARE_PROVIDER_SITE_OTHER): Payer: Medicare Other | Admitting: Obstetrics & Gynecology

## 2013-05-17 ENCOUNTER — Telehealth: Payer: Self-pay | Admitting: Obstetrics & Gynecology

## 2013-05-17 VITALS — BP 130/80 | Wt 149.0 lb

## 2013-05-17 DIAGNOSIS — N993 Prolapse of vaginal vault after hysterectomy: Secondary | ICD-10-CM

## 2013-05-17 NOTE — Telephone Encounter (Signed)
Pt states saw Dr. Despina HiddenEure this week, pessary inserted. Pt c/o discomfort, brownish discharge, and unable to remove pessary. Pt to be here today at 11:15.

## 2013-05-17 NOTE — Progress Notes (Signed)
Patient ID: Erin Herman, female   DOB: 07/04/1927, 80 y.o.   MRN: [id number] The patient presents wondering if her pessary is fitting  Correctly  It is of course noted that we had a great deal of difficulty coming up with a solution for Erin Herman I have tried the smallest all the way up to the size and this is the only one I can get to stay in place The others had just fallen out when the patient withstood the  The problem is because of her previous surgery her vagina is very foreshortened Nonetheless today on exam there is not undue pressure on the vaginal sidewalls or apex and it has remained appropriately in place  She is due to come back in one month and I'll see her back at that time If however she has problems in the meantime she is certainly welcome to come back prior

## 2013-06-18 ENCOUNTER — Ambulatory Visit: Payer: Medicare Other | Admitting: Obstetrics & Gynecology

## 2013-06-19 ENCOUNTER — Other Ambulatory Visit (HOSPITAL_COMMUNITY): Payer: Self-pay | Admitting: *Deleted

## 2013-06-19 MED ORDER — METOPROLOL SUCCINATE ER 50 MG PO TB24: ORAL_TABLET | ORAL | Status: DC

## 2013-07-02 ENCOUNTER — Encounter (HOSPITAL_COMMUNITY): Payer: Medicare Other

## 2013-07-11 ENCOUNTER — Encounter (HOSPITAL_COMMUNITY): Payer: Self-pay

## 2013-07-11 ENCOUNTER — Ambulatory Visit (HOSPITAL_COMMUNITY)
Admission: RE | Admit: 2013-07-11 | Discharge: 2013-07-11 | Disposition: A | Discharge status: Home / Self Care | Payer: Medicare Other | Source: Ambulatory Visit | Attending: Internal Medicine | Admitting: Internal Medicine

## 2013-07-11 VITALS — BP 120/64 | HR 95 | Wt 146.4 lb

## 2013-07-11 DIAGNOSIS — I2510 Atherosclerotic heart disease of native coronary artery without angina pectoris: Secondary | ICD-10-CM

## 2013-07-11 DIAGNOSIS — I359 Nonrheumatic aortic valve disorder, unspecified: Secondary | ICD-10-CM | POA: Insufficient documentation

## 2013-07-11 DIAGNOSIS — I5032 Chronic diastolic (congestive) heart failure: Secondary | ICD-10-CM | POA: Insufficient documentation

## 2013-07-11 LAB — BASIC METABOLIC PANEL
BUN: 42 mg/dL — AB (ref 6–23)
CHLORIDE: 95 meq/L — AB (ref 96–112)
CO2: 27 meq/L (ref 19–32)
CREATININE: 1.14 mg/dL — AB (ref 0.50–1.10)
Calcium: 9.7 mg/dL (ref 8.4–10.5)
GFR calc Af Amer: 50 mL/min — ABNORMAL LOW (ref >90)
GFR calc non Af Amer: 43 mL/min — ABNORMAL LOW (ref >90)
GLUCOSE: 142 mg/dL — AB (ref 70–99)
POTASSIUM: 3.9 meq/L (ref 3.7–5.3)
Sodium: 137 mEq/L (ref 137–147)

## 2013-07-11 NOTE — Progress Notes (Signed)
Patient ID: Erin Herman, female   DOB: 04/20/1928, 80 y.o.   MRN: [id number] PCP: Dr Clelia CroftShaw  HPI:  Erin Herman is a delightful 80 year old woman with a history of coronary artery disease, LBBB and severe aortic stenosis.  She is status post bypass surgery and aortic valve replacement with [employer] Jude's bioprosthesis in 2006.  Post-op, she had a drop in her ejection fraction of 25-30% but this is recovered.  EF was 55% in October 2011 with grade 2 diastolic dysfunction  mildly incresed gradient across valve 15 mm HG.  She returns for follow up today.  Continue with exertional SOB which persists - she thinks it may be some worse. Also c/o swelling in her knees but not ankles. Denies PND/Orthopnea. Weight at home remains stable 147 pounds. Has trouble with statins but now tolerating atorva 10. Dr. Clelia CroftShaw following lipids.. Uses a cane to walk.  We stopped potassium in past but she developed cramps and restarted. Cramps improved.   05/2011: TC 148, HDL 36, TRG 133, LDL 85, ALT 19, AST 27 01/2012 TC 195 HDL 46 TRG 123 LDL 124 ALT 13 AST 29 Creatinine 1.34 BUN 42 08/2011 ECHO 55% Grade 2 diastolic HF  ROS: All systems negative except as listed in HPI, PMH and Problem List.  Past Medical History  Diagnosis Date  . CAD, NATIVE VESSEL 08/05/2008  . HYPERLIPIDEMIA-MIXED 08/03/2008  . HYPERTENSION, BENIGN 08/05/2008  . LBBB (left bundle branch block)   . Diabetes mellitus   . Aortic stenosis     s/p bioprosthetic AVR  . Fatigue     Current Outpatient Prescriptions  Medication Sig Dispense Refill  . aspirin 81 MG tablet Take 81 mg by mouth daily.        . benazepril (LOTENSIN) 40 MG tablet Take 1 tablet (40 mg total) by mouth daily.  90 tablet  3  . cycloSPORINE (RESTASIS) 0.05 % ophthalmic emulsion 1 drop 2 (two) times daily.      . furosemide (LASIX) 40 MG tablet Take 1 tablet (40 mg total) by mouth 2 (two) times daily.  180 tablet  3  . levothyroxine (SYNTHROID, LEVOTHROID) 88 MCG tablet Take 88  mcg by mouth daily.        Marland Kitchen. LOTEMAX 0.5 % ophthalmic suspension Place 1 drop into both eyes 2 (two) times daily.       . metFORMIN (GLUCOPHAGE) 500 MG tablet Take 500 mg by mouth daily with breakfast.        . metoprolol succinate (TOPROL-XL) 50 MG 24 hr tablet Take 1 tab daily  90 tablet  3  . potassium chloride SA (K-DUR,KLOR-CON) 20 MEQ tablet Take 20 mEq by mouth daily.      . sertraline (ZOLOFT) 100 MG tablet Take 50 mg by mouth daily.        Marland Kitchen. spironolactone (ALDACTONE) 25 MG tablet Take 0.5 tablets (12.5 mg total) by mouth daily.  90 tablet  3  . trimethoprim (TRIMPEX) 100 MG tablet Take 100 mg by mouth daily.        No current facility-administered medications for this encounter.     PHYSICAL EXAM: Filed Vitals:   07/11/13 1519  BP: 120/64  Pulse: 95  Weight: 146 lb 6.4 oz (66.407 kg)  SpO2: 94%   General:  Elderly, Well appearing. No resp difficulty HEENT: normal Neck: supple. JVP flat. Carotids 2+ bilaterally; no bruits. No lymphadenopathy or thryomegaly appreciated. Cor: PMI normal. Regular rate & rhythm. No rubs, gallops or murmurs. S2 crisp Lungs:  clear Abdomen: soft, nontender, nondistended. No hepatosplenomegaly. No bruits or masses. Good bowel sounds. Extremities: no cyanosis, clubbing, rash, edema, rt knee + effusion Neuro: alert & orientedx3, cranial nerves grossly intact. Moves all 4 extremities w/o difficulty. Affect pleasant.   ASSESSMENT & PLAN: 1. CAD - stable. No evidence of angina. Continue current therapy 2. AS s/p AVR - doing well. Due for repeat echo. Reinforced need for SBE prophylaxis 3. Chronic diastolic HF - volume status looks good. Knee swelling likely due to arthritis and not HF. Check BMET today.   Can f/u in [facility] office  Reuel Boom Edmund Holcomb,MD 3:32 PM

## 2013-07-11 NOTE — Patient Instructions (Signed)
Lab today  Your physician has requested that you have an echocardiogram. Echocardiography is a painless test that uses sound waves to create images of your heart. It provides your doctor with information about the size and shape of your heart and how well your heart's chambers and valves are working. This procedure takes approximately one hour. There are no restrictions for this procedure.  We will contact you in 1 year to schedule your next appointment with Dr Diona BrownerMcDowell

## 2013-07-16 ENCOUNTER — Other Ambulatory Visit (HOSPITAL_COMMUNITY): Payer: Self-pay | Admitting: Cardiology

## 2013-07-16 DIAGNOSIS — I5032 Chronic diastolic (congestive) heart failure: Secondary | ICD-10-CM

## 2013-07-16 MED ORDER — SPIRONOLACTONE 25 MG PO TABS: 12.5000 mg | ORAL_TABLET | Freq: Every day | ORAL | Status: DC

## 2013-07-16 MED ORDER — FUROSEMIDE 40 MG PO TABS: 40.0000 mg | ORAL_TABLET | Freq: Two times a day (BID) | ORAL | Status: DC

## 2013-07-16 MED ORDER — POTASSIUM CHLORIDE CRYS ER 20 MEQ PO TBCR: 20.0000 meq | EXTENDED_RELEASE_TABLET | Freq: Every day | ORAL | Status: DC

## 2013-07-16 MED ORDER — METOPROLOL SUCCINATE ER 50 MG PO TB24
ORAL_TABLET | ORAL | Status: DC
Start: 2013-07-16 — End: 2014-10-08

## 2013-07-16 MED ORDER — BENAZEPRIL HCL 40 MG PO TABS: 40.0000 mg | ORAL_TABLET | Freq: Every day | ORAL | Status: DC

## 2013-07-26 ENCOUNTER — Other Ambulatory Visit: Payer: Self-pay

## 2013-07-26 ENCOUNTER — Other Ambulatory Visit (INDEPENDENT_AMBULATORY_CARE_PROVIDER_SITE_OTHER): Payer: Medicare Other

## 2013-07-26 DIAGNOSIS — I059 Rheumatic mitral valve disease, unspecified: Secondary | ICD-10-CM

## 2013-07-26 DIAGNOSIS — I5032 Chronic diastolic (congestive) heart failure: Secondary | ICD-10-CM

## 2013-07-26 DIAGNOSIS — I369 Nonrheumatic tricuspid valve disorder, unspecified: Secondary | ICD-10-CM

## 2013-07-26 DIAGNOSIS — I359 Nonrheumatic aortic valve disorder, unspecified: Secondary | ICD-10-CM

## 2014-04-03 ENCOUNTER — Telehealth (HOSPITAL_COMMUNITY): Payer: Self-pay | Admitting: *Deleted

## 2014-04-03 NOTE — Telephone Encounter (Signed)
Received note from [facility] that pt needs left total knee replacement which is sch for 05/20/14, per Dr Gala RomneyBensimhon pt is cleared for surgery from a cardiac standpoint, note faxed back to them at [phone]

## 2014-04-04 ENCOUNTER — Ambulatory Visit (INDEPENDENT_AMBULATORY_CARE_PROVIDER_SITE_OTHER): Payer: Medicare Other | Admitting: Cardiology

## 2014-04-04 ENCOUNTER — Encounter: Payer: Self-pay | Admitting: *Deleted

## 2014-04-04 ENCOUNTER — Encounter: Payer: Self-pay | Admitting: Cardiology

## 2014-04-04 ENCOUNTER — Telehealth: Payer: Self-pay | Admitting: Cardiology

## 2014-04-04 VITALS — BP 91/47 | HR 70 | Ht 63.0 in | Wt 147.0 lb

## 2014-04-04 DIAGNOSIS — I2510 Atherosclerotic heart disease of native coronary artery without angina pectoris: Secondary | ICD-10-CM

## 2014-04-04 DIAGNOSIS — Z01810 Encounter for preprocedural cardiovascular examination: Secondary | ICD-10-CM | POA: Insufficient documentation

## 2014-04-04 DIAGNOSIS — I5032 Chronic diastolic (congestive) heart failure: Secondary | ICD-10-CM

## 2014-04-04 DIAGNOSIS — I429 Cardiomyopathy, unspecified: Secondary | ICD-10-CM

## 2014-04-04 DIAGNOSIS — I359 Nonrheumatic aortic valve disorder, unspecified: Secondary | ICD-10-CM

## 2014-04-04 NOTE — Assessment & Plan Note (Signed)
Status post bioprosthetic aortic valve replacement in 2006, stable by echocardiogram this year.

## 2014-04-04 NOTE — Progress Notes (Signed)
Reason for visit: Preoperative evaluation, CAD, cardiomyopathy, status post AVR  Clinical Summary Erin Herman is an 80 y.o.female former patient of Dr. Gala RomneyBensimhon, last seen in March of this year. She was followed in the [facility] with a history of cardiomyopathy, although improved significantly on medical therapy, follow-up echocardiogram in April of this year showing LVEF 50-55%. She is now being considered for left knee replacement under general anesthesia by Dr. Wyline MoodWeiner, and is referred to the office for evaluation.  She has been functionally limited by progressive left knee pain over the last 6 months, walks with a cane. Prior to this was functionally active with her ADLs including housework. She does not endorse any angina symptoms, reports NYHA class II dyspnea. I reviewed her medications, she continues on aspirin, ACE inhibitor, statin, beta blocker, and Aldactone. ECG today shows sinus rhythm with old left bundle branch block. She has not had ischemic testing since her bypass surgery approximately 10 years ago.  Echocardiogram from April 2015 showed mild LVH with LVEF 50-55%, grade 1 diastolic dysfunction with increased filling pressures, stable aortic bioprosthesis with mean gradient 13 mmHg and no aortic regurgitation, mild to moderate mitral regurgitation, mild to moderate left atrial enlargement, mildly dilated right ventricle with mildly reduced contraction, mild to moderate tricuspid regurgitation with RV-RA gradient 23 mmHg.  Lab work from March showed potassium 3.9, BUN 42, creatinine 1.1.   Allergies  Allergen Reactions  . Morphine   . Penicillins     Current Outpatient Prescriptions  Medication Sig Dispense Refill  . aspirin 81 MG tablet Take 81 mg by mouth daily.      Marland Kitchen. atorvastatin (LIPITOR) 10 MG tablet Take 10 mg by mouth daily.    . benazepril (LOTENSIN) 40 MG tablet Take 1 tablet (40 mg total) by mouth daily. 90 tablet 3  . cycloSPORINE  (RESTASIS) 0.05 % ophthalmic emulsion 1 drop 2 (two) times daily.    . furosemide (LASIX) 40 MG tablet Take 1 tablet (40 mg total) by mouth 2 (two) times daily. 180 tablet 3  . levothyroxine (SYNTHROID, LEVOTHROID) 88 MCG tablet Take 88 mcg by mouth daily.      Marland Kitchen. LOTEMAX 0.5 % ophthalmic suspension Place 1 drop into both eyes 2 (two) times daily.     . metFORMIN (GLUCOPHAGE) 500 MG tablet Take 500 mg by mouth daily with breakfast.      . metoprolol succinate (TOPROL-XL) 50 MG 24 hr tablet Take 1 tab daily 90 tablet 3  . potassium chloride SA (K-DUR,KLOR-CON) 20 MEQ tablet Take 1 tablet (20 mEq total) by mouth daily. 90 tablet 3  . sertraline (ZOLOFT) 100 MG tablet Take 50 mg by mouth daily.      Marland Kitchen. spironolactone (ALDACTONE) 25 MG tablet Take 0.5 tablets (12.5 mg total) by mouth daily. 90 tablet 3  . trimethoprim (TRIMPEX) 100 MG tablet Take 100 mg by mouth daily.      No current facility-administered medications for this visit.    Past Medical History  Diagnosis Date  . CAD (coronary artery disease), native coronary artery     Multivessel status post CABG 2006  . Mixed hyperlipidemia   . Essential hypertension   . LBBB (left bundle branch block)   . Type 2 diabetes mellitus   . Aortic stenosis     Bioprosthetic AVR 2006  . Cardiomyopathy     Past Surgical History  Procedure Laterality Date  . Total knee arthroplasty    . Coronary artery bypass graft  2006     Dr. Cornelius Moraswen - LIMA to LAD, SVG to circumflex  . Aortic valve replacement  2006    Dr. Cornelius Moraswen - 23 mm [place] stentless porcine valve    Family History  Problem Relation Age of Onset  . Heart Problems Mother   . Diabetes Father   . Cirrhosis Father   . Diabetes Other     Social History Erin Herman reports that she has never smoked. She does not have any smokeless tobacco history on file. Erin Herman reports that she does not drink alcohol.  Review of Systems Complete review of systems negative except as otherwise  outlined in the clinical summary and also the following. No palpitations or dizziness. No unusual bleeding problems. No orthopnea or PND.  Physical Examination Filed Vitals:   04/04/14 1506  BP: 91/47  Pulse: 70   Filed Weights   04/04/14 1506  Weight: 147 lb (66.679 kg)   Pleasant elderly woman, appears comfortable at rest. HEENT: Conjunctiva and lids normal, oropharynx clear with moist mucosa. Neck: Supple, no elevated JVP or carotid bruits, no thyromegaly. Lungs: Clear to auscultation, nonlabored breathing at rest. Cardiac: Regular rate and rhythm, no S3, 2-3/6 basal systolic murmur, no pericardial rub. Abdomen: Soft, nontender, bowel sounds present, no guarding or rebound. Extremities: No pitting edema, left knee swollen, distal pulses 2+. Skin: Warm and dry. Musculoskeletal: No kyphosis. Neuropsychiatric: Alert and oriented x3, affect grossly appropriate.   Problem List and Plan   Preoperative cardiovascular examination Patient has been relatively stable symptomatically following bypass surgery back in 2006 with concurrent bioprosthetic aortic valve replacement for aortic stenosis. On medical therapy she has had improvement in LV function with LVEF 50-55% documented earlier in the year and stable aortic bioprosthetic function. She is functionally limited by left knee pain at the present time. ECG shows old left bundle branch block. She has not had formal ischemic testing in the last 9-10 years. We will plan a Lexiscan Cardiolite on medical therapy, mainly to exclude any high risk features that might require further investigation prior to pursuing elective left knee replacement. We will call her with the results and forward final recommendations to Dr. Thurston HoleWainer.  CAD, NATIVE VESSEL Multivessel disease status post CABG in 2006 as outlined above.  Aortic valve disorder Status post bioprosthetic aortic valve replacement in 2006, stable by echocardiogram this year.  Secondary  cardiomyopathy Improvement over time on medical therapy, LVEF 50-55% by echocardiogram in April with grade 1 diastolic dysfunction.    Jonelle SidleSamuel G. McDowell, M.D., F.A.C.C.

## 2014-04-04 NOTE — Assessment & Plan Note (Signed)
Patient has been relatively stable symptomatically following bypass surgery back in 2006 with concurrent bioprosthetic aortic valve replacement for aortic stenosis. On medical therapy she has had improvement in LV function with LVEF 50-55% documented earlier in the year and stable aortic bioprosthetic function. She is functionally limited by left knee pain at the present time. ECG shows old left bundle branch block. She has not had formal ischemic testing in the last 9-10 years. We will plan a Lexiscan Cardiolite on medical therapy, mainly to exclude any high risk features that might require further investigation prior to pursuing elective left knee replacement. We will call her with the results and forward final recommendations to Dr. Thurston HoleWainer.

## 2014-04-04 NOTE — Assessment & Plan Note (Signed)
Multivessel disease status post CABG in 2006 as outlined above.

## 2014-04-04 NOTE — Telephone Encounter (Signed)
Lexiscan Cardiolite on medications dx:CAD & pre[facility]op evaluation [facility] arrive at 8am at Radiology

## 2014-04-04 NOTE — Patient Instructions (Signed)

## 2014-04-04 NOTE — Assessment & Plan Note (Signed)
Improvement over time on medical therapy, LVEF 50-55% by echocardiogram in April with grade 1 diastolic dysfunction.

## 2014-04-05 NOTE — Telephone Encounter (Signed)
[employer] HQIO#[id number]#[id number] [facility]

## 2014-04-09 ENCOUNTER — Encounter (HOSPITAL_COMMUNITY): Payer: Medicare Other

## 2014-04-09 ENCOUNTER — Inpatient Hospital Stay (HOSPITAL_COMMUNITY): Admission: RE | Admit: 2014-04-09 | Payer: Medicare Other | Source: Ambulatory Visit

## 2014-04-23 ENCOUNTER — Encounter (HOSPITAL_COMMUNITY)
Admission: RE | Admit: 2014-04-23 | Discharge: 2014-04-23 | Disposition: A | Discharge status: Home / Self Care | Payer: Medicare Other | Source: Ambulatory Visit | Attending: Cardiology | Admitting: Cardiology

## 2014-04-23 ENCOUNTER — Ambulatory Visit (HOSPITAL_COMMUNITY)
Admission: RE | Admit: 2014-04-23 | Discharge: 2014-04-23 | Disposition: A | Discharge status: Home / Self Care | Payer: Medicare Other | Source: Ambulatory Visit | Attending: Cardiology | Admitting: Cardiology

## 2014-04-23 ENCOUNTER — Encounter (HOSPITAL_COMMUNITY): Payer: Self-pay

## 2014-04-23 DIAGNOSIS — I2510 Atherosclerotic heart disease of native coronary artery without angina pectoris: Secondary | ICD-10-CM | POA: Insufficient documentation

## 2014-04-23 DIAGNOSIS — Z01810 Encounter for preprocedural cardiovascular examination: Secondary | ICD-10-CM | POA: Diagnosis not present

## 2014-04-23 DIAGNOSIS — I429 Cardiomyopathy, unspecified: Secondary | ICD-10-CM

## 2014-04-23 MED ORDER — REGADENOSON 0.4 MG/5ML IV SOLN
0.4000 mg | Freq: Once | INTRAVENOUS | Status: AC | PRN
  Administered 2014-04-23: 0.4 mg via INTRAVENOUS

## 2014-04-23 MED ORDER — SODIUM CHLORIDE 0.9 % IJ SOLN
INTRAMUSCULAR | Status: AC
  Administered 2014-04-23: 10 mL via INTRAVENOUS
  Filled 2014-04-23: qty 10

## 2014-04-23 MED ORDER — SODIUM CHLORIDE 0.9 % IJ SOLN
10.0000 mL | INTRAMUSCULAR | Status: DC | PRN
  Administered 2014-04-23: 10 mL via INTRAVENOUS
  Filled 2014-04-23: qty 10

## 2014-04-23 MED ORDER — REGADENOSON 0.4 MG/5ML IV SOLN
INTRAVENOUS | Status: AC
  Administered 2014-04-23: 0.4 mg via INTRAVENOUS
  Filled 2014-04-23: qty 5

## 2014-04-23 MED ORDER — TECHNETIUM TC 99M SESTAMIBI GENERIC - CARDIOLITE
10.0000 | Freq: Once | INTRAVENOUS | Status: AC | PRN
  Administered 2014-04-23: 10 via INTRAVENOUS

## 2014-04-23 MED ORDER — TECHNETIUM TC 99M SESTAMIBI - CARDIOLITE
30.0000 | Freq: Once | INTRAVENOUS | Status: AC | PRN
Start: 2014-04-23 — End: 2014-04-23
  Administered 2014-04-23: 30 via INTRAVENOUS

## 2014-04-23 NOTE — Progress Notes (Signed)
Stress Lab Nurses Notes - Erin Herman  Erin Herman 04/23/2014 Reason for doing test: CAD and Surgical Clearance Type of test: Marlane HatcherLexiscan Cardiolite  Nurse performing test: Parke PoissonPhyllis Billingsly, RN Nuclear Medicine Tech: Lyndel Pleasureyan Liles Echo Tech: Not Applicable MD performing test: P. Ross/K.Lyman BishopLawrence NP Family MD: Sherryll BurgerShah Test explained and consent signed: Yes.   IV started: Saline lock flushed, No redness or edema and Saline lock started in radiology Symptoms: Chest discomfort & discomfort in legs Treatment/Intervention: None Reason test stopped: protocol completed After recovery IV was: Discontinued via X-ray tech and No redness or edema Patient to return to Nuc. Med at : 11:15 Patient discharged: Home Patient's Condition upon discharge was: stable Comments: During test BP 109/53 & HR 90 . Recovery BP 107/59 & HR 83. Symptoms resolved in recovery.  Erskine SpeedBillingsley, [place]ope Passmore T

## 2014-04-29 ENCOUNTER — Telehealth: Payer: Self-pay | Admitting: *Deleted

## 2014-04-29 NOTE — Telephone Encounter (Signed)
-----   Message from Jonelle Sidle, MD sent at 04/25/2014  8:13 AM EST ----- Reviewed report. Cardiolite shows no large ischemic zones and LV is normal. She can proceed with planned elective knee replacement at an acceptable perioperative cardiac risk (low to intermediate overall). Forward copy of this with my recent office note to Dr. Thurston Hole.

## 2014-04-29 NOTE — Telephone Encounter (Signed)
Notes Recorded by [name], LPN on 12/31/2950 at 12:43 PM Patient notified. Will forward note to Dr. Thurston Hole.

## 2014-04-30 DIAGNOSIS — M1712 Unilateral primary osteoarthritis, left knee: Secondary | ICD-10-CM | POA: Diagnosis present

## 2014-04-30 NOTE — H&P (Signed)
TOTAL KNEE ADMISSION H&P  Patient is being admitted for left total knee arthroplasty.  Subjective:  Chief Complaint:left knee pain.  HPI: Erin Herman, 81 y.o. female, has a history of pain and functional disability in the left knee due to arthritis and has failed non-surgical conservative treatments for greater than 12 weeks to includeNSAID's and/or analgesics, corticosteriod injections, viscosupplementation injections, flexibility and strengthening excercises, supervised PT with diminished ADL's post treatment, weight reduction as appropriate and activity modification.  Onset of symptoms was gradual, starting 10 years ago with gradually worsening course since that time. The patient noted no past surgery on the left knee(s).  Patient currently rates pain in the left knee(s) at 10 out of 10 with activity. Patient has night pain, worsening of pain with activity and weight bearing, pain that interferes with activities of daily living, crepitus and joint swelling.  Patient has evidence of subchondral sclerosis, periarticular osteophytes and joint space narrowing by imaging studies. T There is no active infection.  Patient Active Problem List   Diagnosis Date Noted  . Primary localized osteoarthritis of left knee 04/30/2014  . Preoperative cardiovascular examination 04/04/2014  . Chronic diastolic heart failure 07/03/2012  . CAROTID ARTERY DISEASE 12/09/2009  . Essential hypertension, benign 08/05/2008  . CAD, NATIVE VESSEL 08/05/2008  . Secondary cardiomyopathy 08/05/2008  . Aortic valve disorder 08/05/2008  . Hyperlipidemia 08/03/2008   Past Medical History  Diagnosis Date  . CAD (coronary artery disease), native coronary artery     Multivessel status post CABG 2006  . Mixed hyperlipidemia   . Essential hypertension   . LBBB (left bundle branch block)   . Type 2 diabetes mellitus   . Aortic stenosis     Bioprosthetic AVR 2006  . Cardiomyopathy     Past Surgical History   Procedure Laterality Date  . Total knee arthroplasty    . Coronary artery bypass graft  2006     Dr. Cornelius Moras - LIMA to LAD, SVG to circumflex  . Aortic valve replacement  2006    Dr. Cornelius Moras - 23 mm [place] stentless porcine valve    No current facility-administered medications for this encounter. Current outpatient prescriptions: aspirin 81 MG tablet, Take 81 mg by mouth daily.  , Disp: , Rfl: ;  atorvastatin (LIPITOR) 10 MG tablet, Take 10 mg by mouth daily., Disp: , Rfl: ;  benazepril (LOTENSIN) 40 MG tablet, Take 1 tablet (40 mg total) by mouth daily., Disp: 90 tablet, Rfl: 3;  furosemide (LASIX) 40 MG tablet, Take 1 tablet (40 mg total) by mouth 2 (two) times daily., Disp: 180 tablet, Rfl: 3 levothyroxine (SYNTHROID, LEVOTHROID) 88 MCG tablet, Take 88 mcg by mouth daily.  , Disp: , Rfl: ;  metFORMIN (GLUCOPHAGE) 500 MG tablet, Take 500 mg by mouth daily with breakfast.  , Disp: , Rfl: ;  metoprolol succinate (TOPROL-XL) 50 MG 24 hr tablet, Take 1 tab daily, Disp: 90 tablet, Rfl: 3;  potassium chloride SA (K-DUR,KLOR-CON) 20 MEQ tablet, Take 1 tablet (20 mEq total) by mouth daily., Disp: 90 tablet, Rfl: 3 sertraline (ZOLOFT) 100 MG tablet, Take 50 mg by mouth daily.  , Disp: , Rfl: ;  spironolactone (ALDACTONE) 25 MG tablet, Take 0.5 tablets (12.5 mg total) by mouth daily., Disp: 90 tablet, Rfl: 3;  trimethoprim (TRIMPEX) 100 MG tablet, Take 100 mg by mouth daily. , Disp: , Rfl: ;  cycloSPORINE (RESTASIS) 0.05 % ophthalmic emulsion, 1 drop 2 (two) times daily., Disp: , Rfl:  LOTEMAX 0.5 % ophthalmic suspension,  Place 1 drop into both eyes 2 (two) times daily. , Disp: , Rfl:  Allergies  Allergen Reactions  . Morphine   . Penicillins     History  Substance Use Topics  . Smoking status: Never Smoker   . Smokeless tobacco: Not on file  . Alcohol Use: No    Family History  Problem Relation Age of Onset  . Heart Problems Mother   . Diabetes Father   . Cirrhosis Father   . Diabetes Other       Review of Systems  Constitutional: Negative.   HENT: Negative.   Eyes: Negative.   Respiratory: Negative.   Cardiovascular: Negative.   Gastrointestinal: Negative.   Genitourinary: Negative.   Musculoskeletal: Positive for joint pain. Negative for myalgias, back pain and neck pain.       Left knee swelling and pain  Skin: Negative.   Neurological: Negative.   Endo/Heme/Allergies: Negative.   Psychiatric/Behavioral: Negative.     Objective:  Physical Exam  Constitutional: She is oriented to person, place, and time. She appears well-developed and well-nourished.  HENT:  Head: Normocephalic and atraumatic.  Mouth/Throat: Oropharynx is clear and moist.  Eyes: Conjunctivae and EOM are normal. Pupils are equal, round, and reactive to light.  Neck: Neck supple.  Cardiovascular: Normal rate, regular rhythm and normal heart sounds.   Respiratory: Effort normal and breath sounds normal.  GI: Soft. Bowel sounds are normal.  Genitourinary:  Not pertinent to current symptomatology therefore not examined.  Musculoskeletal:  Examination of her left knee reveals pain medially.  Moderate valgus deformity.  Range of motion -5 to 115 degrees.  Knee is stable with diffuse pain and normal patella tracking.  Examination of the right knee reveals well healed total knee incision without swelling or pain.  Full range of motion.  Knee is stable.  Vascular exam: Pulses are 2+ and symmetric.    Neurological: She is alert and oriented to person, place, and time. She has normal reflexes.  Skin: Skin is warm and dry.  Psychiatric: She has a normal mood and affect. Her behavior is normal.    Vital signs in last 24 hours: Temp:  [97.8 F (36.6 C)] 97.8 F (36.6 C) (01/05 1500) Pulse Rate:  [71] 71 (01/05 1500) Resp:  [12] 12 (01/05 1500) BP: (107)/(61) 107/61 mmHg (01/05 1500) Weight:  [66.679 kg (147 lb)] 66.679 kg (147 lb) (01/05 1500)  Labs:   Estimated body mass index is 25.22 kg/(m^2) as  calculated from the following:   Height as of this encounter:  (1.626 m).   Weight as of this encounter: 66.679 kg (147 lb).   Imaging Review Plain radiographs demonstrate severe degenerative joint disease of the left knee(s). The overall alignment issignificant valgus. The bone quality appears to be good for age and reported activity level.  Assessment/Plan:  End stage arthritis, left knee  Principal Problem:   Primary localized osteoarthritis of left knee Active Problems:   Hyperlipidemia   Essential hypertension, benign   CAD, NATIVE VESSEL   Secondary cardiomyopathy   Aortic valve disorder   Chronic diastolic heart failure   The patient history, physical examination, clinical judgment of the provider and imaging studies are consistent with end stage degenerative joint disease of the left knee(s) and total knee arthroplasty is deemed medically necessary. The treatment options including medical management, injection therapy arthroscopy and arthroplasty were discussed at length. The risks and benefits of total knee arthroplasty were presented and reviewed. The risks due to aseptic  loosening, infection, stiffness, patella tracking problems, thromboembolic complications and other imponderables were discussed. The patient acknowledged the explanation, agreed to proceed with the plan and consent was signed. Patient is being admitted for inpatient treatment for surgery, pain control, PT, OT, prophylactic antibiotics, VTE prophylaxis, progressive ambulation and ADL's and discharge planning. The patient is planning to be discharged home with home health services   Avika Carbine A. Gwinda PasseShepperson, PA-C Physician Assistant [facility] [phone]  04/30/2014, 3:37 PM

## 2014-05-07 ENCOUNTER — Encounter (HOSPITAL_COMMUNITY): Payer: Self-pay

## 2014-05-07 ENCOUNTER — Encounter (HOSPITAL_COMMUNITY)
Admission: RE | Admit: 2014-05-07 | Discharge: 2014-05-07 | Disposition: A | Discharge status: Home / Self Care | Payer: Medicare Other | Source: Ambulatory Visit | Attending: Orthopedic Surgery | Admitting: Orthopedic Surgery

## 2014-05-07 ENCOUNTER — Encounter (HOSPITAL_COMMUNITY)
Admission: RE | Admit: 2014-05-07 | Discharge: 2014-05-07 | Disposition: A | Discharge status: Home / Self Care | Payer: Medicare Other | Source: Ambulatory Visit | Attending: Physician Assistant | Admitting: Physician Assistant

## 2014-05-07 DIAGNOSIS — M1712 Unilateral primary osteoarthritis, left knee: Secondary | ICD-10-CM | POA: Insufficient documentation

## 2014-05-07 DIAGNOSIS — Z01818 Encounter for other preprocedural examination: Secondary | ICD-10-CM | POA: Diagnosis not present

## 2014-05-07 DIAGNOSIS — Z01812 Encounter for preprocedural laboratory examination: Secondary | ICD-10-CM | POA: Insufficient documentation

## 2014-05-07 HISTORY — DX: Major depressive disorder, single episode, unspecified: F32.9

## 2014-05-07 HISTORY — DX: Urinary tract infection, site not specified: N39.0

## 2014-05-07 HISTORY — DX: Personal history of other diseases of the digestive system: Z87.19

## 2014-05-07 HISTORY — DX: Headache: R51

## 2014-05-07 HISTORY — DX: Nausea with vomiting, unspecified: R11.2

## 2014-05-07 HISTORY — DX: Depression, unspecified: F32.A

## 2014-05-07 HISTORY — DX: Other specified postprocedural states: Z98.890

## 2014-05-07 HISTORY — DX: Unspecified osteoarthritis, unspecified site: M19.90

## 2014-05-07 HISTORY — DX: Headache, unspecified: R51.9

## 2014-05-07 HISTORY — DX: Anemia, unspecified: D64.9

## 2014-05-07 LAB — COMPREHENSIVE METABOLIC PANEL
ALBUMIN: 4.6 g/dL (ref 3.5–5.2)
ALT: 18 U/L (ref 0–35)
AST: 25 U/L (ref 0–37)
Alkaline Phosphatase: 81 U/L (ref 39–117)
Anion gap: 9 (ref 5–15)
BILIRUBIN TOTAL: 0.6 mg/dL (ref 0.3–1.2)
BUN: 31 mg/dL — ABNORMAL HIGH (ref 6–23)
CHLORIDE: 95 meq/L — AB (ref 96–112)
CO2: 32 mmol/L (ref 19–32)
CREATININE: 1.24 mg/dL — AB (ref 0.50–1.10)
Calcium: 10.1 mg/dL (ref 8.4–10.5)
GFR calc Af Amer: 45 mL/min — ABNORMAL LOW (ref >90)
GFR calc non Af Amer: 39 mL/min — ABNORMAL LOW (ref >90)
Glucose, Bld: 112 mg/dL — ABNORMAL HIGH (ref 70–99)
POTASSIUM: 3.3 mmol/L — AB (ref 3.5–5.1)
SODIUM: 136 mmol/L (ref 135–145)
Total Protein: 8 g/dL (ref 6.0–8.3)

## 2014-05-07 LAB — TYPE AND SCREEN
ABO/RH(D): A POS
Antibody Screen: NEGATIVE

## 2014-05-07 LAB — URINALYSIS, ROUTINE W REFLEX MICROSCOPIC
Bilirubin Urine: NEGATIVE
Glucose, UA: NEGATIVE mg/dL
HGB URINE DIPSTICK: NEGATIVE
Ketones, ur: NEGATIVE mg/dL
Nitrite: NEGATIVE
PROTEIN: NEGATIVE mg/dL
Specific Gravity, Urine: 1.008 (ref 1.005–1.030)
Urobilinogen, UA: 0.2 mg/dL (ref 0.0–1.0)
pH: 6.5 (ref 5.0–8.0)

## 2014-05-07 LAB — CBC WITH DIFFERENTIAL/PLATELET
Basophils Absolute: 0 10*3/uL (ref 0.0–0.1)
Basophils Relative: 0 % (ref 0–1)
EOS ABS: 0.2 10*3/uL (ref 0.0–0.7)
EOS PCT: 2 % (ref 0–5)
HEMATOCRIT: 37.4 % (ref 36.0–46.0)
HEMOGLOBIN: 12.5 g/dL (ref 12.0–15.0)
LYMPHS PCT: 31 % (ref 12–46)
Lymphs Abs: 2.2 10*3/uL (ref 0.7–4.0)
MCH: 31.5 pg (ref 26.0–34.0)
MCHC: 33.4 g/dL (ref 30.0–36.0)
MCV: 94.2 fL (ref 78.0–100.0)
MONO ABS: 0.6 10*3/uL (ref 0.1–1.0)
MONOS PCT: 9 % (ref 3–12)
Neutro Abs: 4 10*3/uL (ref 1.7–7.7)
Neutrophils Relative %: 58 % (ref 43–77)
PLATELETS: 288 10*3/uL (ref 150–400)
RBC: 3.97 MIL/uL (ref 3.87–5.11)
RDW: 11.9 % (ref 11.5–15.5)
WBC: 7 10*3/uL (ref 4.0–10.5)

## 2014-05-07 LAB — SURGICAL PCR SCREEN
MRSA, PCR: NEGATIVE
Staphylococcus aureus: NEGATIVE

## 2014-05-07 LAB — URINE MICROSCOPIC-ADD ON

## 2014-05-07 LAB — ABO/RH: ABO/RH(D): A POS

## 2014-05-07 LAB — PROTIME-INR
INR: 0.95 (ref 0.00–1.49)
Prothrombin Time: 12.7 seconds (ref 11.6–15.2)

## 2014-05-07 LAB — APTT: aPTT: 34 seconds (ref 24–37)

## 2014-05-07 NOTE — Progress Notes (Signed)
Patient started on antibiotic on the 9th for UTI - Cipro.  DA

## 2014-05-07 NOTE — Pre-Procedure Instructions (Signed)
Debi Balestrieri  05/07/2014   Your procedure is scheduled on: Monday, Jan. 25th   [facility].   Call this number if you have problems the morning of surgery: [phone]   Remember:   Do not eat food or drink liquids after midnight Sunday.   Take these medicines the morning of surgery with A SIP OF WATER: Levothyroxine, Metoprolol, Zoloft.  Please Flonase and Restasis the morning of surgery.                           ( DO NOT take any Diabetes medication the morning of surgery! )   Do not wear jewelry, make-up or nail polish.  Do not wear lotions, powders, or perfumes. You may NOT wear deodorant the morning of surgery.  Do not shave underarms & legs 48 hours prior to surgery.    Do not bring valuables to the hospital.  [facility] any belongings or valuables.               Contacts, dentures or bridgework may not be worn into surgery.  Leave suitcase in the car. After surgery it may be brought to your room.  For patients admitted to the hospital, discharge time is determined by your treatment team.    Name and phone number of your driver:    Special Instructions: "Preparing for Surgery" instruction sheet.   Please read over the following fact sheets that you were given: Pain Booklet, Coughing and Deep Breathing, Blood Transfusion Information, MRSA Information and Surgical Site Infection Prevention

## 2014-05-08 NOTE — Progress Notes (Signed)
Anesthesia Chart Review:  Pt is 81 year old female scheduled for L total knee arthroplasty on 05/20/2014 with Dr. Thurston HoleWainer.   PMH: CAD (s/p CABG 2006), hx aortic stenosis with bioprosthetic aortic valve replacement 2006, chronic diastolic HF, cardiomyopathy, LBBB, HTN, DM, anemia, hyperlipidemia, hyperthyroidism  Preoperative labs reviewed.  K 3.3. BUN 31, Cr 1.24  Chest x-ray reviewed. No acute cardiopulmonary disease. Prior CABG. Cardiomegaly. Normal pulmonary vascularity.   EKG 04/04/2014: SR. LBBB  Nuclear stress test 04/23/2014: -Lexiscan Cardiolite; Electrically xenodiagnostic for ischemia. Cardiolite scan with moderate area of fixed defect in the anteroseptal, inferoseptal and basal septal wall (see above) consistent with scar and possible soft tissue attenuation . No ischemia. LVEF on gating calculated at 67% with septal hypokinesis.  Echo 07/26/2013: - Left ventricle: The cavity size was normal. Wall thickness was increased in a pattern of mild LVH. Systolic function was normal. The estimated ejection fraction was in the range of 50% to 55%. Dysynchronous septal motion consistent with underlying left bundle branch block. Doppler parameters are consistent with abnormal left ventricular relaxation (grade 1 diastolic dysfunction). Doppler parameters are consistent with high ventricular filling pressure. - Aortic valve: Bioprosthetic aortic valve noted, and functioning normally. No significant stenosis or regurgitation. Mean gradient: 13mm Hg (S). Valve area: 1.2cm^2(VTI). Valve area: 1.35cm^2 (Vmax). - Mitral valve: Calcified annulus. Mildly thickened leaflets. Mild to moderate regurgitation. - Left atrium: The atrium was mildly to moderately dilated. - Right ventricle: The cavity size was mildly dilated. Systolic function was mildly reduced. - Right atrium: The atrium was mildly to moderately dilated. - Tricuspid valve: Mild-moderate regurgitation. Peak RV-RA gradient: 23mm Hg (S).  Pt has  cardiac clearance for surgery from Dr. Nona DellSamuel McDowell in Epic telephone encounter by Garnet SierrasAngela Hill dated 04/29/2014.   If no changes, I anticipate pt can proceed with surgery as scheduled.   Rica Mastngela Easton Sivertson, FNP-BC [facility]Anesthesiology Phone: [phone] 05/08/2014 3:47 PM

## 2014-05-19 MED ORDER — CLINDAMYCIN PHOSPHATE 900 MG/50ML IV SOLN
900.0000 mg | INTRAVENOUS | Status: AC
  Administered 2014-05-20: 900 mg via INTRAVENOUS
  Filled 2014-05-19: qty 50

## 2014-05-19 MED ORDER — POVIDONE-IODINE 7.5 % EX SOLN
Freq: Once | CUTANEOUS | Status: DC
  Filled 2014-05-19: qty 118

## 2014-05-19 MED ORDER — CHLORHEXIDINE GLUCONATE 4 % EX LIQD
60.0000 mL | Freq: Once | CUTANEOUS | Status: DC
  Filled 2014-05-19: qty 60

## 2014-05-20 ENCOUNTER — Encounter (HOSPITAL_COMMUNITY): Payer: Self-pay | Admitting: General Practice

## 2014-05-20 ENCOUNTER — Inpatient Hospital Stay (HOSPITAL_COMMUNITY): Payer: Medicare Other | Admitting: Certified Registered Nurse Anesthetist

## 2014-05-20 ENCOUNTER — Encounter (HOSPITAL_COMMUNITY)
Admission: RE | Disposition: A | Discharge status: Home / Self Care | Payer: Self-pay | Source: Ambulatory Visit | Attending: Orthopedic Surgery

## 2014-05-20 ENCOUNTER — Inpatient Hospital Stay (HOSPITAL_COMMUNITY): Payer: Medicare Other | Admitting: Emergency Medicine

## 2014-05-20 ENCOUNTER — Inpatient Hospital Stay (HOSPITAL_COMMUNITY)
Admission: RE | Admit: 2014-05-20 | Discharge: 2014-05-22 | DRG: 470 | Disposition: A | Discharge status: Home / Self Care | Payer: Medicare Other | Source: Ambulatory Visit | Attending: Orthopedic Surgery | Admitting: Orthopedic Surgery

## 2014-05-20 DIAGNOSIS — D62 Acute posthemorrhagic anemia: Secondary | ICD-10-CM | POA: Diagnosis not present

## 2014-05-20 DIAGNOSIS — G43909 Migraine, unspecified, not intractable, without status migrainosus: Secondary | ICD-10-CM | POA: Diagnosis present

## 2014-05-20 DIAGNOSIS — E782 Mixed hyperlipidemia: Secondary | ICD-10-CM | POA: Diagnosis present

## 2014-05-20 DIAGNOSIS — E119 Type 2 diabetes mellitus without complications: Secondary | ICD-10-CM | POA: Diagnosis present

## 2014-05-20 DIAGNOSIS — I2510 Atherosclerotic heart disease of native coronary artery without angina pectoris: Secondary | ICD-10-CM | POA: Diagnosis present

## 2014-05-20 DIAGNOSIS — Z952 Presence of prosthetic heart valve: Secondary | ICD-10-CM | POA: Diagnosis not present

## 2014-05-20 DIAGNOSIS — I359 Nonrheumatic aortic valve disorder, unspecified: Secondary | ICD-10-CM | POA: Diagnosis present

## 2014-05-20 DIAGNOSIS — M25562 Pain in left knee: Secondary | ICD-10-CM | POA: Diagnosis present

## 2014-05-20 DIAGNOSIS — Z880 Allergy status to penicillin: Secondary | ICD-10-CM

## 2014-05-20 DIAGNOSIS — I350 Nonrheumatic aortic (valve) stenosis: Secondary | ICD-10-CM | POA: Diagnosis present

## 2014-05-20 DIAGNOSIS — Z951 Presence of aortocoronary bypass graft: Secondary | ICD-10-CM

## 2014-05-20 DIAGNOSIS — I10 Essential (primary) hypertension: Secondary | ICD-10-CM | POA: Diagnosis present

## 2014-05-20 DIAGNOSIS — I5032 Chronic diastolic (congestive) heart failure: Secondary | ICD-10-CM | POA: Diagnosis present

## 2014-05-20 DIAGNOSIS — M1712 Unilateral primary osteoarthritis, left knee: Principal | ICD-10-CM | POA: Diagnosis present

## 2014-05-20 DIAGNOSIS — M179 Osteoarthritis of knee, unspecified: Secondary | ICD-10-CM | POA: Diagnosis present

## 2014-05-20 DIAGNOSIS — E0590 Thyrotoxicosis, unspecified without thyrotoxic crisis or storm: Secondary | ICD-10-CM | POA: Diagnosis present

## 2014-05-20 DIAGNOSIS — E785 Hyperlipidemia, unspecified: Secondary | ICD-10-CM | POA: Diagnosis present

## 2014-05-20 DIAGNOSIS — Z885 Allergy status to narcotic agent status: Secondary | ICD-10-CM | POA: Diagnosis not present

## 2014-05-20 DIAGNOSIS — I429 Cardiomyopathy, unspecified: Secondary | ICD-10-CM

## 2014-05-20 DIAGNOSIS — M1710 Unilateral primary osteoarthritis, unspecified knee: Secondary | ICD-10-CM | POA: Diagnosis present

## 2014-05-20 HISTORY — PX: TOTAL KNEE ARTHROPLASTY: SHX125

## 2014-05-20 LAB — GLUCOSE, CAPILLARY
GLUCOSE-CAPILLARY: 142 mg/dL — AB (ref 70–99)
GLUCOSE-CAPILLARY: 129 mg/dL — AB (ref 70–99)
Glucose-Capillary: 145 mg/dL — ABNORMAL HIGH (ref 70–99)
Glucose-Capillary: 170 mg/dL — ABNORMAL HIGH (ref 70–99)
Glucose-Capillary: 178 mg/dL — ABNORMAL HIGH (ref 70–99)

## 2014-05-20 LAB — URINALYSIS, ROUTINE W REFLEX MICROSCOPIC
Bilirubin Urine: NEGATIVE
Glucose, UA: NEGATIVE mg/dL
Hgb urine dipstick: NEGATIVE
KETONES UR: NEGATIVE mg/dL
LEUKOCYTES UA: NEGATIVE
Nitrite: NEGATIVE
PH: 7.5 (ref 5.0–8.0)
PROTEIN: NEGATIVE mg/dL
SPECIFIC GRAVITY, URINE: 1.009 (ref 1.005–1.030)
UROBILINOGEN UA: 0.2 mg/dL (ref 0.0–1.0)

## 2014-05-20 SURGERY — ARTHROPLASTY, KNEE, TOTAL
Anesthesia: Spinal | Site: Knee | Laterality: Left

## 2014-05-20 MED ORDER — HYDROMORPHONE HCL 1 MG/ML IJ SOLN: 0.5000 mg | INTRAMUSCULAR | Status: DC | PRN

## 2014-05-20 MED ORDER — DEXAMETHASONE SODIUM PHOSPHATE 10 MG/ML IJ SOLN
10.0000 mg | Freq: Three times a day (TID) | INTRAMUSCULAR | Status: DC
  Administered 2014-05-20 – 2014-05-22 (×6): 10 mg via INTRAVENOUS
  Filled 2014-05-20 (×10): qty 1

## 2014-05-20 MED ORDER — BUPIVACAINE-EPINEPHRINE (PF) 0.25% -1:200000 IJ SOLN
INTRAMUSCULAR | Status: AC
  Filled 2014-05-20: qty 30

## 2014-05-20 MED ORDER — ONDANSETRON HCL 4 MG PO TABS: 4.0000 mg | ORAL_TABLET | Freq: Four times a day (QID) | ORAL | Status: DC | PRN

## 2014-05-20 MED ORDER — LEVOTHYROXINE SODIUM 100 MCG PO TABS
100.0000 ug | ORAL_TABLET | Freq: Every day | ORAL | Status: DC
  Administered 2014-05-21 – 2014-05-22 (×2): 100 ug via ORAL
  Filled 2014-05-20 (×3): qty 1

## 2014-05-20 MED ORDER — CELECOXIB 200 MG PO CAPS
200.0000 mg | ORAL_CAPSULE | Freq: Two times a day (BID) | ORAL | Status: DC
  Administered 2014-05-20 – 2014-05-22 (×5): 200 mg via ORAL
  Filled 2014-05-20 (×6): qty 1

## 2014-05-20 MED ORDER — VITAMIN D3 125 MCG (5000 UT) PO TABS: 5000.0000 [IU] | ORAL_TABLET | Freq: Every day | ORAL | Status: DC

## 2014-05-20 MED ORDER — INSULIN ASPART 100 UNIT/ML ~~LOC~~ SOLN
0.0000 [IU] | Freq: Three times a day (TID) | SUBCUTANEOUS | Status: DC
  Administered 2014-05-20: 4 [IU] via SUBCUTANEOUS
  Administered 2014-05-20: 3 [IU] via SUBCUTANEOUS
  Administered 2014-05-21 (×2): 4 [IU] via SUBCUTANEOUS
  Administered 2014-05-22: 100 [IU] via SUBCUTANEOUS

## 2014-05-20 MED ORDER — PHENYLEPHRINE HCL 10 MG/ML IJ SOLN
INTRAMUSCULAR | Status: DC | PRN
  Administered 2014-05-20: 120 ug via INTRAVENOUS
  Administered 2014-05-20: 40 ug via INTRAVENOUS
  Administered 2014-05-20: 80 ug via INTRAVENOUS
  Administered 2014-05-20: 40 ug via INTRAVENOUS
  Administered 2014-05-20: 120 ug via INTRAVENOUS
  Administered 2014-05-20 (×2): 80 ug via INTRAVENOUS
  Administered 2014-05-20 (×4): 40 ug via INTRAVENOUS

## 2014-05-20 MED ORDER — DOCUSATE SODIUM 100 MG PO CAPS
100.0000 mg | ORAL_CAPSULE | Freq: Two times a day (BID) | ORAL | Status: DC
  Administered 2014-05-20 – 2014-05-21 (×3): 100 mg via ORAL
  Filled 2014-05-20 (×6): qty 1

## 2014-05-20 MED ORDER — ASPIRIN EC 325 MG PO TBEC
325.0000 mg | DELAYED_RELEASE_TABLET | Freq: Every day | ORAL | Status: DC
  Administered 2014-05-21 – 2014-05-22 (×2): 325 mg via ORAL
  Filled 2014-05-20 (×3): qty 1

## 2014-05-20 MED ORDER — PROPOFOL 10 MG/ML IV BOLUS
INTRAVENOUS | Status: AC
  Filled 2014-05-20: qty 20

## 2014-05-20 MED ORDER — METOPROLOL TARTRATE 50 MG PO TABS
50.0000 mg | ORAL_TABLET | Freq: Once | ORAL | Status: AC
  Administered 2014-05-20: 50 mg via ORAL

## 2014-05-20 MED ORDER — POLYETHYLENE GLYCOL 3350 17 G PO PACK: 17.0000 g | PACK | Freq: Every day | ORAL | Status: DC | PRN

## 2014-05-20 MED ORDER — SPIRONOLACTONE 12.5 MG HALF TABLET
12.5000 mg | ORAL_TABLET | Freq: Every day | ORAL | Status: DC
  Filled 2014-05-20: qty 1

## 2014-05-20 MED ORDER — ACETAMINOPHEN 650 MG RE SUPP: 650.0000 mg | Freq: Four times a day (QID) | RECTAL | Status: DC | PRN

## 2014-05-20 MED ORDER — VITAMIN C 500 MG PO TABS
500.0000 mg | ORAL_TABLET | Freq: Two times a day (BID) | ORAL | Status: DC
  Administered 2014-05-20 – 2014-05-22 (×5): 500 mg via ORAL
  Filled 2014-05-20 (×6): qty 1

## 2014-05-20 MED ORDER — POTASSIUM CHLORIDE IN NACL 20-0.9 MEQ/L-% IV SOLN
INTRAVENOUS | Status: DC
  Administered 2014-05-20: 20:00:00 via INTRAVENOUS
  Filled 2014-05-20 (×6): qty 1000

## 2014-05-20 MED ORDER — METOCLOPRAMIDE HCL 10 MG PO TABS: 5.0000 mg | ORAL_TABLET | Freq: Three times a day (TID) | ORAL | Status: DC | PRN

## 2014-05-20 MED ORDER — ALUM & MAG HYDROXIDE-SIMETH 200-200-20 MG/5ML PO SUSP: 30.0000 mL | ORAL | Status: DC | PRN

## 2014-05-20 MED ORDER — POTASSIUM CHLORIDE CRYS ER 20 MEQ PO TBCR
20.0000 meq | EXTENDED_RELEASE_TABLET | Freq: Every day | ORAL | Status: DC
  Administered 2014-05-20 – 2014-05-21 (×2): 20 meq via ORAL
  Filled 2014-05-20 (×3): qty 1

## 2014-05-20 MED ORDER — ATORVASTATIN CALCIUM 10 MG PO TABS
10.0000 mg | ORAL_TABLET | Freq: Every day | ORAL | Status: DC
  Administered 2014-05-20 – 2014-05-21 (×2): 10 mg via ORAL
  Filled 2014-05-20 (×3): qty 1

## 2014-05-20 MED ORDER — DIPHENHYDRAMINE HCL 12.5 MG/5ML PO ELIX: 12.5000 mg | ORAL_SOLUTION | ORAL | Status: DC | PRN

## 2014-05-20 MED ORDER — PROMETHAZINE HCL 25 MG/ML IJ SOLN: 6.2500 mg | INTRAMUSCULAR | Status: DC | PRN

## 2014-05-20 MED ORDER — SODIUM CHLORIDE 0.9 % IR SOLN
Status: DC | PRN
  Administered 2014-05-20: 3000 mL

## 2014-05-20 MED ORDER — BENAZEPRIL HCL 40 MG PO TABS
40.0000 mg | ORAL_TABLET | Freq: Every day | ORAL | Status: DC
  Filled 2014-05-20: qty 1

## 2014-05-20 MED ORDER — HYDROMORPHONE HCL 1 MG/ML IJ SOLN: 0.2500 mg | INTRAMUSCULAR | Status: DC | PRN

## 2014-05-20 MED ORDER — POLYETHYL GLYCOL-PROPYL GLYCOL 0.4-0.3 % OP SOLN: 1.0000 [drp] | Freq: Four times a day (QID) | OPHTHALMIC | Status: DC | PRN

## 2014-05-20 MED ORDER — SERTRALINE HCL 50 MG PO TABS
50.0000 mg | ORAL_TABLET | Freq: Every day | ORAL | Status: DC
  Administered 2014-05-20 – 2014-05-21 (×2): 50 mg via ORAL
  Filled 2014-05-20 (×3): qty 1

## 2014-05-20 MED ORDER — FENTANYL CITRATE 0.05 MG/ML IJ SOLN
INTRAMUSCULAR | Status: DC | PRN
  Administered 2014-05-20: 25 ug via INTRAVENOUS
  Administered 2014-05-20: 50 ug via INTRAVENOUS

## 2014-05-20 MED ORDER — METOPROLOL SUCCINATE ER 50 MG PO TB24
50.0000 mg | ORAL_TABLET | Freq: Every day | ORAL | Status: DC
  Administered 2014-05-21 – 2014-05-22 (×2): 50 mg via ORAL
  Filled 2014-05-20 (×2): qty 1

## 2014-05-20 MED ORDER — FENTANYL CITRATE 0.05 MG/ML IJ SOLN
INTRAMUSCULAR | Status: AC
  Filled 2014-05-20: qty 5

## 2014-05-20 MED ORDER — VITAMIN D3 25 MCG (1000 UNIT) PO TABS
5000.0000 [IU] | ORAL_TABLET | Freq: Every day | ORAL | Status: DC
  Administered 2014-05-20 – 2014-05-22 (×3): 5000 [IU] via ORAL
  Filled 2014-05-20 (×3): qty 5

## 2014-05-20 MED ORDER — CLINDAMYCIN PHOSPHATE 600 MG/50ML IV SOLN
600.0000 mg | Freq: Four times a day (QID) | INTRAVENOUS | Status: AC
  Administered 2014-05-20 (×2): 600 mg via INTRAVENOUS
  Filled 2014-05-20 (×2): qty 50

## 2014-05-20 MED ORDER — ONDANSETRON HCL 4 MG/2ML IJ SOLN: 4.0000 mg | Freq: Four times a day (QID) | INTRAMUSCULAR | Status: DC | PRN

## 2014-05-20 MED ORDER — MENTHOL 3 MG MT LOZG: 1.0000 | LOZENGE | OROMUCOSAL | Status: DC | PRN

## 2014-05-20 MED ORDER — FLUTICASONE PROPIONATE 50 MCG/ACT NA SUSP
2.0000 | Freq: Every day | NASAL | Status: DC
  Administered 2014-05-20 – 2014-05-21 (×2): 2 via NASAL
  Filled 2014-05-20: qty 16

## 2014-05-20 MED ORDER — PHENYLEPHRINE 40 MCG/ML (10ML) SYRINGE FOR IV PUSH (FOR BLOOD PRESSURE SUPPORT)
PREFILLED_SYRINGE | INTRAVENOUS | Status: AC
  Filled 2014-05-20: qty 20

## 2014-05-20 MED ORDER — METOPROLOL TARTRATE 50 MG PO TABS
ORAL_TABLET | ORAL | Status: AC
Start: 2014-05-20 — End: 2014-05-20
  Filled 2014-05-20: qty 1

## 2014-05-20 MED ORDER — BUPIVACAINE IN DEXTROSE 0.75-8.25 % IT SOLN
INTRATHECAL | Status: DC | PRN
  Administered 2014-05-20: 1.8 mL via INTRATHECAL

## 2014-05-20 MED ORDER — BUPIVACAINE-EPINEPHRINE (PF) 0.5% -1:200000 IJ SOLN
INTRAMUSCULAR | Status: DC | PRN
  Administered 2014-05-20: 25 mL via PERINEURAL

## 2014-05-20 MED ORDER — SODIUM CHLORIDE 0.9 % IJ SOLN
INTRAMUSCULAR | Status: AC
  Filled 2014-05-20: qty 10

## 2014-05-20 MED ORDER — BUPIVACAINE-EPINEPHRINE 0.25% -1:200000 IJ SOLN
INTRAMUSCULAR | Status: DC | PRN
  Administered 2014-05-20: 20 mL

## 2014-05-20 MED ORDER — LACTATED RINGERS IV SOLN
INTRAVENOUS | Status: DC
  Administered 2014-05-20 (×2): via INTRAVENOUS

## 2014-05-20 MED ORDER — SUCCINYLCHOLINE CHLORIDE 20 MG/ML IJ SOLN
INTRAMUSCULAR | Status: AC
  Filled 2014-05-20: qty 1

## 2014-05-20 MED ORDER — DEXAMETHASONE SODIUM PHOSPHATE 4 MG/ML IJ SOLN
INTRAMUSCULAR | Status: DC | PRN
Start: 2014-05-20 — End: 2014-05-20
  Administered 2014-05-20: 10 mg via INTRAVENOUS

## 2014-05-20 MED ORDER — FUROSEMIDE 40 MG PO TABS
40.0000 mg | ORAL_TABLET | Freq: Two times a day (BID) | ORAL | Status: DC
  Administered 2014-05-21 – 2014-05-22 (×3): 40 mg via ORAL
  Filled 2014-05-20 (×5): qty 1

## 2014-05-20 MED ORDER — EPHEDRINE SULFATE 50 MG/ML IJ SOLN
INTRAMUSCULAR | Status: AC
  Filled 2014-05-20: qty 1

## 2014-05-20 MED ORDER — ACETAMINOPHEN 325 MG PO TABS
650.0000 mg | ORAL_TABLET | Freq: Four times a day (QID) | ORAL | Status: DC | PRN
  Administered 2014-05-20 – 2014-05-22 (×6): 650 mg via ORAL
  Filled 2014-05-20 (×6): qty 2

## 2014-05-20 MED ORDER — ROCURONIUM BROMIDE 50 MG/5ML IV SOLN
INTRAVENOUS | Status: AC
  Filled 2014-05-20: qty 1

## 2014-05-20 MED ORDER — POLYVINYL ALCOHOL 1.4 % OP SOLN
1.0000 [drp] | Freq: Four times a day (QID) | OPHTHALMIC | Status: DC | PRN
  Administered 2014-05-20 – 2014-05-21 (×4): 1 [drp] via OPHTHALMIC
  Filled 2014-05-20: qty 15

## 2014-05-20 MED ORDER — METOCLOPRAMIDE HCL 5 MG/ML IJ SOLN: 5.0000 mg | Freq: Three times a day (TID) | INTRAMUSCULAR | Status: DC | PRN

## 2014-05-20 MED ORDER — PHENOL 1.4 % MT LIQD: 1.0000 | OROMUCOSAL | Status: DC | PRN

## 2014-05-20 MED ORDER — SODIUM CHLORIDE 0.9 % IR SOLN
Status: DC | PRN
  Administered 2014-05-20: 1000 mL

## 2014-05-20 MED ORDER — INSULIN ASPART 100 UNIT/ML ~~LOC~~ SOLN
0.0000 [IU] | Freq: Every day | SUBCUTANEOUS | Status: DC
  Administered 2014-05-21: 100 [IU] via SUBCUTANEOUS

## 2014-05-20 MED ORDER — OXYCODONE HCL 5 MG PO TABS
5.0000 mg | ORAL_TABLET | ORAL | Status: DC | PRN
  Filled 2014-05-20: qty 2

## 2014-05-20 MED ORDER — PROPOFOL INFUSION 10 MG/ML OPTIME
INTRAVENOUS | Status: DC | PRN
  Administered 2014-05-20: 25 ug/kg/min via INTRAVENOUS

## 2014-05-20 SURGICAL SUPPLY — 76 items
APL SKNCLS STERI-STRIP NONHPOA (GAUZE/BANDAGES/DRESSINGS) ×1
BANDAGE ELASTIC 6 VELCRO ST LF (GAUZE/BANDAGES/DRESSINGS) ×2 IMPLANT
BANDAGE ESMARK 6X9 LF (GAUZE/BANDAGES/DRESSINGS) ×1 IMPLANT
BENZOIN TINCTURE PRP APPL 2/3 (GAUZE/BANDAGES/DRESSINGS) ×3 IMPLANT
BLADE SAGITTAL 25.0X1.19X90 (BLADE) ×2 IMPLANT
BLADE SAGITTAL 25.0X1.19X90MM (BLADE) ×1
BLADE SAW SGTL 11.0X1.19X90.0M (BLADE) IMPLANT
BLADE SAW SGTL 13.0X1.19X90.0M (BLADE) ×3 IMPLANT
BLADE SURG 10 STRL SS (BLADE) ×6 IMPLANT
BNDG CMPR 9X6 STRL LF SNTH (GAUZE/BANDAGES/DRESSINGS) ×1
BNDG CMPR MED 15X6 ELC VLCR LF (GAUZE/BANDAGES/DRESSINGS) ×1
BNDG ELASTIC 6X15 VLCR STRL LF (GAUZE/BANDAGES/DRESSINGS) ×3 IMPLANT
BNDG ESMARK 6X9 LF (GAUZE/BANDAGES/DRESSINGS) ×3
BOWL SMART MIX CTS (DISPOSABLE) ×3 IMPLANT
CAP KNEE TOTAL 3 SIGMA ×2 IMPLANT
CEMENT HV SMART SET (Cement) ×6 IMPLANT
CLOSURE STERI-STRIP 1/2X4 (GAUZE/BANDAGES/DRESSINGS) ×1
CLOSURE WOUND 1/2 X4 (GAUZE/BANDAGES/DRESSINGS) ×1
CLSR STERI-STRIP ANTIMIC 1/2X4 (GAUZE/BANDAGES/DRESSINGS) ×1 IMPLANT
COVER SURGICAL LIGHT HANDLE (MISCELLANEOUS) ×3 IMPLANT
CUFF TOURNIQUET SINGLE 34IN LL (TOURNIQUET CUFF) ×3 IMPLANT
CUFF TOURNIQUET SINGLE 44IN (TOURNIQUET CUFF) IMPLANT
DRAPE EXTREMITY T 121X128X90 (DRAPE) ×3 IMPLANT
DRAPE IMP U-DRAPE 54X76 (DRAPES) ×3 IMPLANT
DRAPE INCISE IOBAN 66X45 STRL (DRAPES) ×3 IMPLANT
DRAPE PROXIMA HALF (DRAPES) ×3 IMPLANT
DRAPE U-SHAPE 47X51 STRL (DRAPES) ×3 IMPLANT
DRSG AQUACEL AG ADV 3.5X14 (GAUZE/BANDAGES/DRESSINGS) ×3 IMPLANT
DRSG PAD ABDOMINAL 8X10 ST (GAUZE/BANDAGES/DRESSINGS) ×4 IMPLANT
DURAPREP 26ML APPLICATOR (WOUND CARE) ×6 IMPLANT
ELECT CAUTERY BLADE 6.4 (BLADE) ×3 IMPLANT
ELECT REM PT RETURN 9FT ADLT (ELECTROSURGICAL) ×3
ELECTRODE REM PT RTRN 9FT ADLT (ELECTROSURGICAL) ×1 IMPLANT
EVACUATOR 1/8 PVC DRAIN (DRAIN) ×3 IMPLANT
FACESHIELD WRAPAROUND (MASK) ×3 IMPLANT
FACESHIELD WRAPAROUND OR TEAM (MASK) ×1 IMPLANT
GAUZE SPONGE 4X4 12PLY STRL (GAUZE/BANDAGES/DRESSINGS) ×3 IMPLANT
GLOVE BIO SURGEON STRL SZ7 (GLOVE) ×3 IMPLANT
GLOVE BIOGEL PI IND STRL 7.0 (GLOVE) ×1 IMPLANT
GLOVE BIOGEL PI IND STRL 7.5 (GLOVE) ×1 IMPLANT
GLOVE BIOGEL PI INDICATOR 7.0 (GLOVE) ×2
GLOVE BIOGEL PI INDICATOR 7.5 (GLOVE) ×2
GLOVE SS BIOGEL STRL SZ 7.5 (GLOVE) ×1 IMPLANT
GLOVE SUPERSENSE BIOGEL SZ 7.5 (GLOVE) ×2
GOWN STRL REUS W/ TWL LRG LVL3 (GOWN DISPOSABLE) ×2 IMPLANT
GOWN STRL REUS W/ TWL XL LVL3 (GOWN DISPOSABLE) ×2 IMPLANT
GOWN STRL REUS W/TWL LRG LVL3 (GOWN DISPOSABLE) ×6
GOWN STRL REUS W/TWL XL LVL3 (GOWN DISPOSABLE) ×6
HANDPIECE INTERPULSE COAX TIP (DISPOSABLE) ×3
HOOD PEEL AWAY FACE SHEILD DIS (HOOD) ×6 IMPLANT
IMMOBILIZER KNEE 22 UNIV (SOFTGOODS) IMPLANT
KIT BASIN OR (CUSTOM PROCEDURE TRAY) ×3 IMPLANT
KIT ROOM TURNOVER OR (KITS) ×3 IMPLANT
MANIFOLD NEPTUNE II (INSTRUMENTS) ×3 IMPLANT
MARKER SKIN DUAL TIP RULER LAB (MISCELLANEOUS) ×3 IMPLANT
NS IRRIG 1000ML POUR BTL (IV SOLUTION) ×3 IMPLANT
PACK TOTAL JOINT (CUSTOM PROCEDURE TRAY) ×3 IMPLANT
PACK UNIVERSAL I (CUSTOM PROCEDURE TRAY) ×3 IMPLANT
PAD ARMBOARD 7.5X6 YLW CONV (MISCELLANEOUS) ×6 IMPLANT
PADDING CAST COTTON 6X4 STRL (CAST SUPPLIES) ×3 IMPLANT
RUBBERBAND STERILE (MISCELLANEOUS) ×3 IMPLANT
SET HNDPC FAN SPRY TIP SCT (DISPOSABLE) ×1 IMPLANT
STRIP CLOSURE SKIN 1/2X4 (GAUZE/BANDAGES/DRESSINGS) ×2 IMPLANT
SUCTION FRAZIER TIP 10 FR DISP (SUCTIONS) ×3 IMPLANT
SUT ETHIBOND NAB CT1 #1 30IN (SUTURE) ×6 IMPLANT
SUT MNCRL AB 3-0 PS2 18 (SUTURE) ×3 IMPLANT
SUT VIC AB 0 CT1 27 (SUTURE) ×6
SUT VIC AB 0 CT1 27XBRD ANBCTR (SUTURE) ×2 IMPLANT
SUT VIC AB 2-0 CT1 27 (SUTURE) ×6
SUT VIC AB 2-0 CT1 TAPERPNT 27 (SUTURE) ×2 IMPLANT
SYR 30ML SLIP (SYRINGE) ×3 IMPLANT
TOWEL OR 17X24 6PK STRL BLUE (TOWEL DISPOSABLE) ×3 IMPLANT
TOWEL OR 17X26 10 PK STRL BLUE (TOWEL DISPOSABLE) ×3 IMPLANT
TRAY FOLEY CATH 16FR SILVER (SET/KITS/TRAYS/PACK) ×3 IMPLANT
UPCHARGE REV TRAY MBT KNEE ×2 IMPLANT
WATER STERILE IRR 1000ML POUR (IV SOLUTION) ×6 IMPLANT

## 2014-05-20 NOTE — Anesthesia Postprocedure Evaluation (Signed)
  Anesthesia Post-op Note  Patient: Erin Herman  Procedure(s) Performed: Procedure(s): LEFT TOTAL KNEE ARTHROPLASTY (Left)  Patient Location: PACU  Anesthesia Type:Regional and Spinal  Level of Consciousness: awake and alert   Airway and Oxygen Therapy: Patient Spontanous Breathing  Post-op Pain: none  Post-op Assessment: Post-op Vital signs reviewed  Post-op Vital Signs: Reviewed  Last Vitals:  Filed Vitals:   05/20/14 1117  BP: 113/55  Pulse: 69  Temp:   Resp: 13    Complications: No apparent anesthesia complications

## 2014-05-20 NOTE — Anesthesia Procedure Notes (Addendum)
Anesthesia Regional Block:  Adductor canal block  Pre-Anesthetic Checklist: ,, timeout performed, Correct Patient, Correct Site, Correct Laterality, Correct Procedure, Correct Position, site marked, Risks and benefits discussed,  Surgical consent,  Pre-op evaluation,  At surgeon's request and post-op pain management   Prep: chloraprep       Needles:  Injection technique: Single-shot  Needle Type: Echogenic Needle     Needle Length: 9cm 9 cm Needle Gauge: 21 and 21 G    Additional Needles:  Procedures: ultrasound guided (picture in chart) Adductor canal block Narrative:  Start time: 05/20/2014 6:50 AM End time: 05/20/2014 7:00 AM Injection made incrementally with aspirations every 5 mL.  Performed by: Personally  Anesthesiologist: Suzette Battiest E  Additional Notes: Risks and benefits explained to pt. Pt tolerated procedure well with no immediate complications.   Spinal Patient location during procedure: OR Staffing Anesthesiologist: Suzette Battiest E Performed by: anesthesiologist  Preanesthetic Checklist Completed: patient identified, site marked, surgical consent, pre-op evaluation, timeout performed, IV checked, risks and benefits discussed and monitors and equipment checked Spinal Block Patient position: sitting Prep: ChloraPrep and site prepped and draped Patient monitoring: heart rate, continuous pulse ox and blood pressure Approach: midline Location: L5-S1 Injection technique: single-shot Needle Needle type: Spinocan  Needle gauge: 22 G Needle length: 9 cm Additional Notes Expiration date of kit checked and confirmed. CSF free flowing and aspiration observed before and after injection LA. Patient tolerated procedure well, without complications.

## 2014-05-20 NOTE — Progress Notes (Signed)
Orthopedic Tech Progress Note Patient Details:  Erin Herman [birth year] [id number] CPM applied to LLE with appropriate settings. OHF applied to bed. Footsie roll provided.  CPM Left Knee CPM Left Knee: On Left Knee Flexion (Degrees): 90 Left Knee Extension (Degrees): 0   Asia R Thompson 05/20/2014, 10:40 AM

## 2014-05-20 NOTE — Anesthesia Preprocedure Evaluation (Addendum)
Anesthesia Evaluation  Patient identified by MRN, date of birth, ID band Patient awake    Reviewed: Allergy & Precautions, NPO status , Patient's Chart, lab work & pertinent test results  History of Anesthesia Complications (+) PONV  Airway Mallampati: II  TM Distance: >3 FB Neck ROM: Full    Dental   Pulmonary neg pulmonary ROS,          Cardiovascular hypertension, + CAD, + Cardiac Stents, + Peripheral Vascular Disease and +CHF + dysrhythmias (LBBB)  S/p CABG and bioprosthetic AV replacement in 2006. 07/2013 Echo EF 50-55%, Normal functioning AV.   Neuro/Psych negative neurological ROS     GI/Hepatic Neg liver ROS, hiatal hernia,   Endo/Other  diabetes, Type 2Hypothyroidism   Renal/GU Renal InsufficiencyRenal diseasenegative Renal ROS     Musculoskeletal  (+) Arthritis -,   Abdominal   Peds  Hematology  (+) anemia ,   Anesthesia Other Findings   Reproductive/Obstetrics                            Anesthesia Physical Anesthesia Plan  ASA: III  Anesthesia Plan: Spinal   Post-op Pain Management: MAC Combined w/ Regional for Post-op pain   Induction: Intravenous  Airway Management Planned: Natural Airway and Simple Face Mask  Additional Equipment:   Intra-op Plan:   Post-operative Plan:   Informed Consent: I have reviewed the patients History and Physical, chart, labs and discussed the procedure including the risks, benefits and alternatives for the proposed anesthesia with the patient or authorized representative who has indicated his/her understanding and acceptance.     Plan Discussed with: CRNA  Anesthesia Plan Comments:        Anesthesia Quick Evaluation

## 2014-05-20 NOTE — Op Note (Signed)
MRN:     [id number] DOB/AGE:    1927/07/15 / 81 y.o.       OPERATIVE REPORT    DATE OF PROCEDURE:  05/20/2014       PREOPERATIVE DIAGNOSIS: PRIMARY LOCALIZED  OA LEFT KNEE      Estimated body mass index is 24.88 kg/(m^2) as calculated from the following:   Height as of this encounter: 5\' 4"  (1.626 m).   Weight as of this encounter: 65.772 kg (145 lb).                                                        POSTOPERATIVE DIAGNOSIS: SAME                                                                    PROCEDURE:  Procedure(s): LEFT TOTAL KNEE ARTHROPLASTY Using Depuy Sigma RP implants #2.5 Femur, #2.5 RevisionTibia, 10mm sigma RP bearing, 32 Patella     SURGEON: Christena Sunderlin A    ASSISTANT:  Kirstin Shepperson PA-C   (Present and scrubbed throughout the case, critical for assistance with exposure, retraction, instrumentation, and closure.)         ANESTHESIA: SPINAL with Femoral Nerve Block  DRAINS: foley, 2 medium hemovac in knee   TOURNIQUET TIME: 75min   COMPLICATIONS:  None     SPECIMENS: None   INDICATIONS FOR PROCEDURE: The patient has  OA LEFT KNEE, varus deformities, XR shows bone on bone arthritis. Patient has failed all conservative measures including anti-inflammatory medicines, narcotics, attempts at  exercise and weight loss, cortisone injections and viscosupplementation.  Risks and benefits of surgery have been discussed, questions answered.   DESCRIPTION OF PROCEDURE: The patient identified by armband, received  right femoral nerve block and IV antibiotics[facility] Patient taken to the operating room, appropriate anesthetic  monitors were attached General endotracheal anesthesia induced with  the patient in supine position, Foley catheter was inserted. Tourniquet  applied high to the operative thigh. Lateral post and foot positioner  applied to the table, the lower extremity was then prepped and draped  in usual sterile fashion  from the ankle to the tourniquet. Time-out procedure was performed. The limb was wrapped with an Esmarch bandage and the tourniquet inflated to 365 mmHg. We began the operation by making the anterior midline incision starting at handbreadth above the patella going over the patella 1 cm medial to and  4 cm distal to the tibial tubercle. Small bleeders in the skin and the  subcutaneous tissue identified and cauterized. Transverse retinaculum was incised and reflected medially and a medial parapatellar arthrotomy was accomplished. the patella was everted and theprepatellar fat pad resected. The superficial medial collateral  ligament was then elevated from anterior to posterior along the proximal  flare of the tibia and anterior half of the menisci resected. The knee was hyperflexed exposing bone on bone arthritis. Peripheral and notch osteophytes as well as the cruciate ligaments were then resected. We continued to  work our way around posteriorly along the proximal tibia, and externally  rotated the  tibia subluxing it out from underneath the femur. A McHale  retractor was placed through the notch and a lateral Hohmann retractor  placed, and we then drilled through the proximal tibia in line with the  axis of the tibia followed by an intramedullary guide rod and 2-degree  posterior slope cutting guide. The tibial cutting guide was pinned into place  allowing resection of 4 mm of bone medially and about 2 mm of bone  laterally because of her valgus deformity. Satisfied with the tibial resection, we then  entered the distal femur 2 mm anterior to the PCL origin with the  intramedullary guide rod and applied the distal femoral cutting guide  set at 11mm, with 5 degrees of valgus. This was pinned along the  epicondylar axis. At this point, the distal femoral cut was accomplished without difficulty. We then sized for a #2.5 femoral component and pinned the guide in 3 degrees of external rotation.The chamfer  cutting guide was pinned into place. The anterior, posterior, and chamfer cuts were accomplished without difficulty followed by  the Sigma RP box cutting guide and the box cut. We also removed posterior osteophytes from the posterior femoral condyles. At this  time, the knee was brought into full extension. We checked our  extension and flexion gaps and found them symmetric at 10mm.  The patella thickness measured at 21 mm. We set the cutting guide at 13 and removed the posterior 8 mm  of the patella sized for 32 button and drilled the lollipop. The knee  was then once again hyperflexed exposing the proximal tibia. We sized for a #2.5 revision  tibial base plate due to her severe osteopenia, applied the smokestack and the conical reamer followed by the the Delta fin keel punch. We then hammered into place the Sigma RP trial femoral component, inserted a 10-mm trial bearing, trial patellar button, and took the knee through range of motion from 0-130 degrees. No thumb pressure was required for patellar  tracking. At this point, all trial components were removed, a double batch of DePuy HV cement  was mixed and applied to all bony metallic mating surfaces except for the posterior condyles of the femur itself. In order, we  hammered into place the tibial tray and removed excess cement, the femoral component and removed excess cement, a 10-mm Sigma RP bearing  was inserted, and the knee brought to full extension with compression.  The patellar button was clamped into place, and excess cement  removed. While the cement cured the wound was irrigated out with normal saline solution pulse lavage, and medium Hemovac drains were placed.. Ligament stability and patellar tracking were checked and found to be excellent. The tourniquet was then released and hemostasis was obtained with cautery. The parapatellar arthrotomy was closed with  #1 ethibond suture. The subcutaneous tissue with 0 and 2-0 undyed  Vicryl suture,  and 4-0 Monocryl.. A dressing of Xeroform,  4 x 4, dressing sponges, Webril, and Ace wrap applied. Needle and sponge count were correct times 2.The patient awakened, extubated, and taken to recovery room without difficulty. Vascular status was normal, pulses 2+ and symmetric.   Michael Walrath A 05/20/2014, 8:46 AM

## 2014-05-20 NOTE — Progress Notes (Signed)
Utilization review completed. Laila Myhre, RN, BSN. 

## 2014-05-20 NOTE — Interval H&P Note (Signed)
History and Physical Interval Note:  05/20/2014 7:03 AM  Erin Herman  has presented today for surgery, with the diagnosis of PRIMARY LOCALIZED OA LEFT KNEE  The various methods of treatment have been discussed with the patient and family. After consideration of risks, benefits and other options for treatment, the patient has consented to  Procedure(s): LEFT TOTAL KNEE ARTHROPLASTY (Left) as a surgical intervention .  The patient's history has been reviewed, patient examined, no change in status, stable for surgery.  I have reviewed the patient's chart and labs.  Questions were answered to the patient's satisfaction.     Salvatore MarvelWAINER,Edlin Ford A

## 2014-05-20 NOTE — Transfer of Care (Signed)
Immediate Anesthesia Transfer of Care Note  Patient: Erin Herman  Procedure(s) Performed: Procedure(s): LEFT TOTAL KNEE ARTHROPLASTY (Left)  Patient Location: PACU  Anesthesia Type:MAC and Spinal  Level of Consciousness: awake, alert  and oriented  Airway & Oxygen Therapy: Patient Spontanous Breathing and Patient connected to nasal cannula oxygen  Post-op Assessment: Report given to PACU RN and Post -op Vital signs reviewed and stable  Post vital signs: Reviewed and stable  Complications: No apparent anesthesia complications

## 2014-05-20 NOTE — Evaluation (Signed)
Physical Therapy Evaluation Patient Details Name: Erin Herman MRN: [id number] DOB: Dec 08, 1927 Today's Date: 05/20/2014   History of Present Illness  Pt 81 yo female admitted 1/25 for elective L TKA.  Clinical Impression  Pt is s/p TKA resulting in the deficits listed below (see PT Problem List). Pt with mimimal pain and able to tolerate ambulation well for first time up. Anticipate pt progressing quickly and to be safe for d/c home once medically cleared. Pt will benefit from skilled PT to increase their independence and safety with mobility to allow discharge to the venue listed below.      Follow Up Recommendations Home health PT;Supervision/Assistance - 24 hour    Equipment Recommendations  Rolling walker with 5" wheels;3in1 (PT)    Recommendations for Other Services       Precautions / Restrictions Precautions Precautions: Knee Precaution Comments: educated on zero foam Restrictions Weight Bearing Restrictions: Yes LLE Weight Bearing: Weight bearing as tolerated      Mobility  Bed Mobility Overal bed mobility: Needs Assistance Bed Mobility: Supine to Sit     Supine to sit: Min guard     General bed mobility comments: pt able to manage LE indep, used bed rails to assist wit trunk  Transfers Overall transfer level: Needs assistance Equipment used: Rolling walker (2 wheeled) Transfers: Sit to/from Stand Sit to Stand: Min guard         General transfer comment: v/c's to push up from bed otherwise great technique  Ambulation/Gait Ambulation/Gait assistance: Min assist Ambulation Distance (Feet): 20 Feet Assistive device: Rolling walker (2 wheeled) Gait Pattern/deviations: Step-to pattern;Antalgic;Decreased stride length;Decreased step length - left;Decreased stance time - left   Gait velocity interpretation: Below normal speed for age/gender General Gait Details: pt with no L knee buckling or increased in pain. v/c's forr sequencing intitially but then  did well on own  Stairs            Wheelchair Mobility    Modified Rankin (Stroke Patients Only)       Balance                                             Pertinent Vitals/Pain Pain Assessment: 0-10 Pain Score: 2  Pain Location: L knee    Home Living Family/patient expects to be discharged to:: Private residence Living Arrangements: Spouse/significant other Available Help at Discharge: Family;Available 24 hours/day Type of Home: House Home Access: Stairs to enter Entrance Stairs-Rails: None (has post) Secretary/administrator of Steps: 2 Home Layout: One level Home Equipment: None      Prior Function Level of Independence: Independent               Hand Dominance   Dominant Hand: Right    Extremity/Trunk Assessment   Upper Extremity Assessment: Overall WFL for tasks assessed           Lower Extremity Assessment: LLE deficits/detail   LLE Deficits / Details: pt able to complete SLR and quad set, able to initiate knee flexion  Cervical / Trunk Assessment: Kyphotic  Communication   Communication: No difficulties  Cognition Arousal/Alertness: Awake/alert Behavior During Therapy: WFL for tasks assessed/performed Overall Cognitive Status: Within Functional Limits for tasks assessed                      General Comments      Exercises Total  Joint Exercises Ankle Circles/Pumps: PROM;Both;10 reps Quad Sets: AROM;Left;10 reps Heel Slides: AROM;Left;5 reps Goniometric ROM: 6-75 knee flexion      Assessment/Plan    PT Assessment Patient needs continued PT services  PT Diagnosis Difficulty walking;Acute pain   PT Problem List Decreased strength;Decreased range of motion;Decreased activity tolerance;Decreased balance;Decreased mobility  PT Treatment Interventions DME instruction;Gait training;Stair training;Functional mobility training;Therapeutic activities;Therapeutic exercise   PT Goals (Current goals can be found  in the Care Plan section) Acute Rehab PT Goals Patient Stated Goal: home tomorrow PT Goal Formulation: With patient Time For Goal Achievement: 05/27/14 Potential to Achieve Goals: Good    Frequency 7X/week   Barriers to discharge        Co-evaluation               End of Session Equipment Utilized During Treatment: Gait belt Activity Tolerance: Patient tolerated treatment well Patient left: in chair;with call bell/phone within reach Nurse Communication: Mobility status         Time: [id number] PT Time Calculation (min) (ACUTE ONLY): 18 min   Charges:   PT Evaluation $Initial PT Evaluation Tier I: 1 Procedure     PT G CodesMarcene Brawn:        [name] 05/20/2014, 4:52 PM  Lewis ShockAshly Anglia Blakley, PT, DPT Pager #: [phone] Office #: [phone]

## 2014-05-21 ENCOUNTER — Encounter (HOSPITAL_COMMUNITY): Payer: Self-pay | Admitting: Orthopedic Surgery

## 2014-05-21 LAB — URINE CULTURE
CULTURE: NO GROWTH
Colony Count: NO GROWTH

## 2014-05-21 LAB — CBC
HCT: 26.1 % — ABNORMAL LOW (ref 36.0–46.0)
HEMOGLOBIN: 8.9 g/dL — AB (ref 12.0–15.0)
MCH: 31.4 pg (ref 26.0–34.0)
MCHC: 34.1 g/dL (ref 30.0–36.0)
MCV: 92.2 fL (ref 78.0–100.0)
Platelets: 205 10*3/uL (ref 150–400)
RBC: 2.83 MIL/uL — AB (ref 3.87–5.11)
RDW: 11.7 % (ref 11.5–15.5)
WBC: 11.6 10*3/uL — ABNORMAL HIGH (ref 4.0–10.5)

## 2014-05-21 LAB — BASIC METABOLIC PANEL
ANION GAP: 10 (ref 5–15)
BUN: 37 mg/dL — ABNORMAL HIGH (ref 6–23)
CALCIUM: 8.4 mg/dL (ref 8.4–10.5)
CHLORIDE: 101 mmol/L (ref 96–112)
CO2: 23 mmol/L (ref 19–32)
Creatinine, Ser: 1.18 mg/dL — ABNORMAL HIGH (ref 0.50–1.10)
GFR, EST AFRICAN AMERICAN: 48 mL/min — AB (ref >90)
GFR, EST NON AFRICAN AMERICAN: 41 mL/min — AB (ref >90)
Glucose, Bld: 202 mg/dL — ABNORMAL HIGH (ref 70–99)
POTASSIUM: 4.5 mmol/L (ref 3.5–5.1)
SODIUM: 134 mmol/L — AB (ref 135–145)

## 2014-05-21 LAB — GLUCOSE, CAPILLARY
GLUCOSE-CAPILLARY: 172 mg/dL — AB (ref 70–99)
Glucose-Capillary: 182 mg/dL — ABNORMAL HIGH (ref 70–99)
Glucose-Capillary: 211 mg/dL — ABNORMAL HIGH (ref 70–99)

## 2014-05-21 NOTE — Progress Notes (Signed)
Occupational Therapy Evaluation Patient Details Name: Erin JoDemetris Herman MRN: [id number] DOB: 1927-10-16 Today's Date: 05/21/2014    History of Present Illness Pt 81 yo female admitted 1/25 for elective L TKA.   Clinical Impression   Making excellent progress. Completed all education for ADL, functional mobility for ADL and reducing fall risks at home. Pt will have 24/7 assist after D/C and is appropriate for D?C home when medically stable. OT signing off. Thanks    Follow Up Recommendations  No OT follow up;Supervision/Assistance - 24 hour (initially)    Equipment Recommendations  None recommended by OT    Recommendations for Other Services       Precautions / Restrictions Precautions Precautions: Knee Precaution Comments: educated on zero foam Restrictions Weight Bearing Restrictions: Yes LLE Weight Bearing: Weight bearing as tolerated      Mobility Bed Mobility Overal bed mobility: Needs Assistance Bed Mobility: Supine to Sit     Supine to sit: Supervision     General bed mobility comments:  (Pt up in chair)  Transfers Overall transfer level: Needs assistance Equipment used: Rolling walker (2 wheeled) Transfers: Sit to/from Stand Sit to Stand: Supervision             Balance Overall balance assessment: Needs assistance         Standing balance support: During functional activity Standing balance-Leahy Scale: Good Standing balance comment: Able to negotiate @ bathroom and manipulate objects without LOB; good safety awareness                            ADL Overall ADL's : Needs assistance/impaired     Grooming: Modified independent       Lower Body Bathing: Min guard;Sit to/from stand   Upper Body Dressing : Set up   Lower Body Dressing: Minimal assistance;Sit to/from stand Lower Body Dressing Details (indicate cue type and reason): assistance for L sock Toilet Transfer: Supervision/safety;Ambulation;Comfort height  toilet Toilet Transfer Details (indicate cue type and reason): Discussed home set up . Pt does not have room for 3 in 1 over toilet. will use higher toilet. Also educated on alternative technique to use countertop to push up.stabilize Toileting- ArchitectClothing Manipulation and Hygiene: Modified independent;Sit to/from stand   Tub/ Shower Transfer: Min guard;Cueing for safety;Ambulation;Shower seat;Rolling walker (simulated) Tub/Shower Transfer Details (indicate cue type and reason): Educated pt on techniques for tub transfers. Pt has shower chair to use at home. Recommended for pt to have HHPT assess set up prior to pt completing independently Functional mobility during ADLs: Supervision/safety;Rolling walker General ADL Comments: Overall set up. Family can assist at this level. discussed home modifications to reduce risk of falls and maximize independence after D/C     Vision                     Perception     Praxis      Pertinent Vitals/Pain Pain Assessment: 0-10 Pain Score: 3  Pain Location: L knee Pain Descriptors / Indicators: Aching Pain Intervention(s): Limited activity within patient's tolerance;Monitored during session;Repositioned     Hand Dominance Right   Extremity/Trunk Assessment Upper Extremity Assessment Upper Extremity Assessment: Overall WFL for tasks assessed   Lower Extremity Assessment Lower Extremity Assessment: Defer to PT evaluation   Cervical / Trunk Assessment Cervical / Trunk Assessment: Kyphotic   Communication Communication Communication: No difficulties   Cognition Arousal/Alertness: Awake/alert Behavior During Therapy: WFL for tasks assessed/performed Overall Cognitive Status: Within Functional Limits  for tasks assessed                     General Comments       Exercises Exercises: Total Joint     Shoulder Instructions      Home Living Family/patient expects to be discharged to:: Private residence Living Arrangements:  Spouse/significant other Available Help at Discharge: Family;Available 24 hours/day Type of Home: House Home Access: Stairs to enter [employer] of Steps: 2 Entrance Stairs-Rails: None (has post) Home Layout: One level     Bathroom Shower/Tub: Tub/shower unit Shower/tub characteristics: Engineer, building services: Standard Bathroom Accessibility: Yes How Accessible: Accessible via walker Home Equipment: Walker - 2 wheels;Walker - 4 wheels;Shower seat          Prior Functioning/Environment Level of Independence: Independent             OT Diagnosis: Generalized weakness;Acute pain   OT Problem List: Decreased strength;Decreased range of motion;Pain;Decreased knowledge of use of DME or AE   OT Treatment/Interventions:      OT Goals(Current goals can be found in the care plan section) Acute Rehab OT Goals Patient Stated Goal: home tomorrow OT Goal Formulation: All assessment and education complete, DC therapy   End of Session Equipment Utilized During Treatment: Gait belt;Rolling walker  Activity Tolerance:   Patient left:     Time: 1610-9604 OT Time Calculation (min): 18 min Charges:  OT General Charges $OT Visit: 1 Procedure OT Evaluation $Initial OT Evaluation Tier I: 1 Procedure G-Codes:    Erin Herman,Erin Herman 06/04/2014, 10:23 AM   Erin Herman, OTR/L  [phone] 06-04-2014

## 2014-05-21 NOTE — Progress Notes (Signed)
CARE MANAGEMENT NOTE 05/21/2014  Patient:  Amelia JoHUTCHERSON,Irena   Account Number:  [id number]  Date Initiated:  05/20/2014  Documentation initiated by:  [facility] [place]  Subjective/Objective Assessment:   s/p left TKA     Action/Plan:   PT/OT evals-recommended HHPT   Anticipated DC Date:  05/22/2014   Anticipated DC Plan:  HOME W HOME HEALTH SERVICES      DC Planning Services  CM consult      [facility]  DURABLE MEDICAL EQUIPMENT   Choice offered to / List presented to:  C-1 Patient   DME arranged  CPM      DME agency  [employer]     HH arranged  HH-2 PT      HH agency  [facility]   Status of service:  In process, will continue to follow Medicare Important Message given?   (If response is "NO", the following Medicare IM given date fields will be blank) Date Medicare IM given:   Medicare IM given by:   Date Additional Medicare IM given:   Additional Medicare IM given by:    Discharge Disposition:  HOME W HOME HEALTH SERVICES  Per UR Regulation:  Reviewed for med. necessity/level of care/duration of stay  If discussed at Long Length of Stay Meetings, dates discussed:    Comments:  05/21/14 Set up with [facility] for HHPT by MD office.Spoke with patient, no change in d/c plan. [employer] will deliver CPM to home. Patient states that she has a rolling walker and 3N1. Family able to assist after d/c.

## 2014-05-21 NOTE — Progress Notes (Signed)
Physical Therapy Treatment Patient Details Name: Kamauri Kathol MRN: [id number] DOB: November 01, 1927 Today's Date: 05/21/2014    History of Present Illness Pt 81 yo female admitted 1/25 for elective L TKA.    PT Comments    Pt con't to progress well today all goals. Pt with good home set up and support and anticipate pt will do well with stair negotiation this afternoon during PT session and will be safe to d/c home with family once medically stable.  Follow Up Recommendations  Home health PT;Supervision/Assistance - 24 hour     Equipment Recommendations  Rolling walker with 5" wheels;3in1 (PT)    Recommendations for Other Services       Precautions / Restrictions Precautions Precautions: Knee Precaution Comments: educated to use zero foam minimally 30 min multiple times a day Restrictions Weight Bearing Restrictions: Yes LLE Weight Bearing: Weight bearing as tolerated    Mobility  Bed Mobility Overal bed mobility: Needs Assistance Bed Mobility: Supine to Sit     Supine to sit: Supervision     General bed mobility comments: pt able to manage LEs well  Transfers Overall transfer level: Needs assistance Equipment used: Rolling walker (2 wheeled) Transfers: Sit to/from Stand Sit to Stand: Min guard         General transfer comment: v/c's to push up from bed  Ambulation/Gait Ambulation/Gait assistance: Min guard Ambulation Distance (Feet): 280 Feet Assistive device: Rolling walker (2 wheeled) Gait Pattern/deviations: Antalgic;Step-through pattern Gait velocity: slow Gait velocity interpretation: Below normal speed for age/gender General Gait Details: v/c's to achieve L knee extension during stance phase and con't to push walker for more fluid gait pattern, mild antalgia   Stairs            Wheelchair Mobility    Modified Rankin (Stroke Patients Only)       Balance Overall balance assessment: Needs assistance         Standing balance  support: During functional activity;No upper extremity supported Standing balance-Leahy Scale: Fair Standing balance comment: pt able to pull underwear up without LOB                    Cognition Arousal/Alertness: Awake/alert Behavior During Therapy: WFL for tasks assessed/performed Overall Cognitive Status: Within Functional Limits for tasks assessed                      Exercises Total Joint Exercises Quad Sets: AROM;Left;10 reps Heel Slides: AROM;Left;10 reps Long Arc Quad: AROM;Left;10 reps Goniometric ROM: 5-82    General Comments        Pertinent Vitals/Pain Pain Assessment: 0-10 Pain Score: 5  Pain Location: L knee    Home Living                      Prior Function            PT Goals (current goals can now be found in the care plan section) Acute Rehab PT Goals Patient Stated Goal: home soon Progress towards PT goals: Progressing toward goals    Frequency  7X/week    PT Plan Current plan remains appropriate    Co-evaluation             End of Session Equipment Utilized During Treatment: Gait belt Activity Tolerance: Patient tolerated treatment well Patient left: in chair;with call bell/phone within reach     Time: 0718-0749 PT Time Calculation (min) (ACUTE ONLY): 31 min  Charges:  $Gait Training: 8-22  mins $Therapeutic Exercise: 8-22 mins                    G Codes:      Marcene BrawnChadwell, Marvena Tally Marie 05/21/2014, 8:59 AM  Lewis ShockAshly Ausha Sieh, PT, DPT Pager #: [phone] Office #: [phone]

## 2014-05-21 NOTE — Progress Notes (Signed)
PT TREATMENT NOTE  Clinical impression: Pt progressing wonderfully and functioning at supervision level. Will go over stair negotiation with family tomorrow AM prior to planned d/c.    05/21/14 1344  PT Visit Information  Last PT Received On 05/21/14  Assistance Needed +1  History of Present Illness Pt 81 yo female admitted 1/25 for elective L TKA.  PT Time Calculation  PT Start Time (ACUTE ONLY) 1344  PT Stop Time (ACUTE ONLY) 1400  PT Time Calculation (min) (ACUTE ONLY) 16 min  Subjective Data  Subjective "I just got back in bed but I"ll walk"  Precautions  Precautions Knee  Precaution Comments educated on zero foam  Restrictions  Weight Bearing Restrictions Yes  LLE Weight Bearing WBAT  Pain Assessment  Pain Assessment 0-10  Pain Score 3  Pain Location L knee  Pain Descriptors / Indicators Aching  Pain Intervention(s) Monitored during session  Cognition  Arousal/Alertness Awake/alert  Behavior During Therapy WFL for tasks assessed/performed  Overall Cognitive Status Within Functional Limits for tasks assessed  Bed Mobility  Overal bed mobility Needs Assistance  Bed Mobility Supine to Sit;Sit to Supine  Supine to sit Supervision  Sit to supine Supervision  General bed mobility comments pt demo'd safe technique  Transfers  Overall transfer level Needs assistance  Equipment used Rolling walker (2 wheeled)  Transfers Sit to/from Stand  Sit to Stand Supervision  General transfer comment v/c's to push up from bed  Ambulation/Gait  Ambulation/Gait assistance Supervision  Ambulation Distance (Feet) 380 Feet  Assistive device Rolling walker (2 wheeled)  Gait Pattern/deviations Step-through pattern  General Gait Details v/c's to achieve L knee extension during stance phase and con't to push walker for more fluid gait pattern, mild antalgia  Stairs Yes  Stairs assistance Min guard  Stair Management One rail Right (and L HHA)  Number of Stairs 4  General stair comments  v/c's for sequencing up with the good, down with the bad  PT - End of Session  Equipment Utilized During Treatment Gait belt  Activity Tolerance Patient tolerated treatment well  Patient left in bed;with call bell/phone within reach  Nurse Communication Mobility status  PT - Assessment/Plan  PT Plan Current plan remains appropriate  PT Frequency (ACUTE ONLY) 7X/week  Follow Up Recommendations Home health PT;Supervision/Assistance - 24 hour  PT equipment Rolling walker with 5" wheels;3in1 (PT)  PT Goal Progression  Progress towards PT goals Progressing toward goals  PT General Charges  $$ ACUTE PT VISIT 1 Procedure  PT Treatments  $Gait Training 8-22 mins   Lewis ShockAshly Jhamir Pickup, PT, DPT Pager #: [phone] Office #: [phone]

## 2014-05-21 NOTE — Plan of Care (Signed)
Problem: Consults Goal: Diagnosis- Total Joint Replacement Primary Total Knee Left     

## 2014-05-22 DIAGNOSIS — D62 Acute posthemorrhagic anemia: Secondary | ICD-10-CM | POA: Diagnosis not present

## 2014-05-22 LAB — CBC
HCT: 24.1 % — ABNORMAL LOW (ref 36.0–46.0)
Hemoglobin: 8.3 g/dL — ABNORMAL LOW (ref 12.0–15.0)
MCH: 31.6 pg (ref 26.0–34.0)
MCHC: 34.4 g/dL (ref 30.0–36.0)
MCV: 91.6 fL (ref 78.0–100.0)
Platelets: 196 10*3/uL (ref 150–400)
RBC: 2.63 MIL/uL — ABNORMAL LOW (ref 3.87–5.11)
RDW: 11.9 % (ref 11.5–15.5)
WBC: 11.6 10*3/uL — ABNORMAL HIGH (ref 4.0–10.5)

## 2014-05-22 LAB — BASIC METABOLIC PANEL
Anion gap: 7 (ref 5–15)
BUN: 47 mg/dL — ABNORMAL HIGH (ref 6–23)
CALCIUM: 8.2 mg/dL — AB (ref 8.4–10.5)
CO2: 24 mmol/L (ref 19–32)
Chloride: 99 mmol/L (ref 96–112)
Creatinine, Ser: 1.4 mg/dL — ABNORMAL HIGH (ref 0.50–1.10)
GFR, EST AFRICAN AMERICAN: 39 mL/min — AB (ref >90)
GFR, EST NON AFRICAN AMERICAN: 33 mL/min — AB (ref >90)
GLUCOSE: 190 mg/dL — AB (ref 70–99)
POTASSIUM: 4.6 mmol/L (ref 3.5–5.1)
Sodium: 130 mmol/L — ABNORMAL LOW (ref 135–145)

## 2014-05-22 LAB — GLUCOSE, CAPILLARY: Glucose-Capillary: 172 mg/dL — ABNORMAL HIGH (ref 70–99)

## 2014-05-22 MED ORDER — DOCUSATE SODIUM 100 MG PO CAPS: ORAL_CAPSULE | ORAL | Status: DC

## 2014-05-22 MED ORDER — ASPIRIN 325 MG PO TBEC: DELAYED_RELEASE_TABLET | ORAL | Status: DC

## 2014-05-22 MED ORDER — OXYCODONE HCL 5 MG PO TABS: ORAL_TABLET | ORAL | Status: DC

## 2014-05-22 NOTE — Progress Notes (Signed)
Physical Therapy Treatment Patient Details Name: Erin Herman MRN: [id number] DOB: 09-11-1927 Today's Date: 05/22/2014    History of Present Illness Pt 81 yo female admitted 1/25 for elective L TKA.    PT Comments    Pt progressing well and [facility] with minimal antalgia. Daughter with good return demo on assist pt with stair negotiation. Pt safe to d/c home when medically appropriate.  Follow Up Recommendations  Home health PT;Supervision/Assistance - 24 hour     Equipment Recommendations  Rolling walker with 5" wheels;3in1 (PT)    Recommendations for Other Services       Precautions / Restrictions Precautions Precautions: Knee Restrictions Weight Bearing Restrictions: Yes LLE Weight Bearing: Weight bearing as tolerated    Mobility  Bed Mobility Overal bed mobility: Modified Independent Bed Mobility: Supine to Sit     Supine to sit: Modified independent (Device/Increase time)     General bed mobility comments: pt demo'd safe technique with ease  Transfers Overall transfer level: Needs assistance Equipment used: Rolling walker (2 wheeled) Transfers: Sit to/from Stand Sit to Stand: Supervision         General transfer comment: pt with good technique  Ambulation/Gait Ambulation/Gait assistance: Supervision Ambulation Distance (Feet): 580 Feet Assistive device: Rolling walker (2 wheeled);Straight cane Gait Pattern/deviations: Step-through pattern Gait velocity: wfl   General Gait Details: [facility] ambulating with SPC with minimal antalgia and minimal WB [facility]. pt demo'd appropriate sequencing with SPC. no L knee instability    Stairs Stairs: Yes Stairs assistance: Min assist Stair Management: No rails;With cane (L HHA ) Number of Stairs: 4 General stair comments: daughter present and assist pt up/down stairs with good return demonstration. v/c's to stick to step to vs alternating  Wheelchair  Mobility    Modified Rankin (Stroke Patients Only)       Balance                                    Cognition Arousal/Alertness: Awake/alert Behavior During Therapy: WFL for tasks assessed/performed Overall Cognitive Status: Within Functional Limits for tasks assessed                      Exercises Total Joint Exercises Quad Sets: AROM;Left;10 reps Heel Slides: AROM;Left;10 reps Long Arc Quad: AROM;Left;10 reps Goniometric ROM: 2-98 Other Exercises Other Exercises: pt ambulated backwards with RW x 100 feet to promote knee extension    General Comments        Pertinent Vitals/Pain Pain Assessment: 0-10 Pain Score: 1  Pain Location: L knee Pain Descriptors / Indicators: Sore Pain Intervention(s): Monitored during session    Home Living                      Prior Function            PT Goals (current goals can now be found in the care plan section) Progress towards PT goals: Progressing toward goals    Frequency  7X/week    PT Plan Current plan remains appropriate    Co-evaluation             End of Session Equipment Utilized During Treatment: Gait belt Activity Tolerance: Patient tolerated treatment well Patient left: in bed;with call bell/phone within reach     Time: 4098-11911047-1118 PT Time Calculation (min) (ACUTE ONLY): 31 min  Charges:  $  Gait Training: 8-22 mins $Therapeutic Exercise: 8-22 mins                    G Codes:      Marcene Brawn 05/22/2014, 12:26 PM   Lewis Shock, PT, DPT Pager #: [phone] Office #: [phone]

## 2014-05-22 NOTE — Discharge Summary (Signed)
Patient ID: Erin Herman MRN: [id number] DOB/AGE: February 14, 1928 81 y.o.  Admit date: 05/20/2014 Discharge date: 05/22/2014  Admission Diagnoses:  Principal Problem:   Primary localized osteoarthritis of left knee Active Problems:   Hyperlipidemia   Essential hypertension, benign   CAD, NATIVE VESSEL   Secondary cardiomyopathy   Aortic valve disorder   Chronic diastolic heart failure   DJD (degenerative joint disease) of knee   Postoperative anemia due to acute blood loss   Discharge Diagnoses:  Same  Past Medical History  Diagnosis Date  . CAD (coronary artery disease), native coronary artery     Multivessel status post CABG 2006  . Mixed hyperlipidemia   . Essential hypertension   . LBBB (left bundle branch block)   . Type 2 diabetes mellitus   . Aortic stenosis     Bioprosthetic AVR 2006  . Cardiomyopathy   . Postoperative nausea and vomiting     only with Morphine  . CHF (congestive heart failure)   . Hyperthyroidism   . Depression   . UTI (lower urinary tract infection)     has one right now  . History of hiatal hernia   . Headache     migraines...."i outgrew them"  . Arthritis   . Anemia     Surgeries: Procedure(s): LEFT TOTAL KNEE ARTHROPLASTY on 05/20/2014   Consultants:    Discharged Condition: Improved  Hospital Course: Erin Herman is an 81 y.o. female who was admitted 05/20/2014 for operative treatment ofPrimary localized osteoarthritis of left knee. Patient has severe unremitting pain that affects sleep, daily activities, and work/hobbies. After pre-op clearance the patient was taken to the operating room on 05/20/2014 and underwent  Procedure(s): LEFT TOTAL KNEE ARTHROPLASTY.    Patient was given perioperative antibiotics:      Anti-infectives    Start     Dose/Rate Route Frequency Ordered Stop   05/20/14 1300  clindamycin (CLEOCIN) IVPB 600 mg     600 mg100 mL/hr over 30 Minutes Intravenous Every 6 hours 05/20/14 1143 05/20/14 1918    05/20/14 0600  clindamycin (CLEOCIN) IVPB 900 mg     900 mg100 mL/hr over 30 Minutes Intravenous On call to O.R. 05/19/14 1458 05/20/14 0753       Patient was given sequential compression devices, early ambulation, and chemoprophylaxis to prevent DVT.  Patient benefited maximally from hospital stay and there were no complications.    Recent vital signs:  Patient Vitals for the past 24 hrs:  BP Temp Temp src Pulse Resp SpO2  05/22/14 0443 (!) 118/53 mmHg 97.8 F (36.6 C) Oral 72 17 98 %  05/22/14 0000 - - - - 17 100 %  05/21/14 2000 - - - - 17 100 %  05/21/14 1942 (!) 134/56 mmHg 98.2 F (36.8 C) Oral 77 17 100 %  05/21/14 1600 - - - - 18 -  05/21/14 1207 (!) 108/58 mmHg 98.6 F (37 C) - 77 18 97 %     Recent laboratory studies:   Recent Labs  05/21/14 0451 05/22/14 0542  WBC 11.6* 11.6*  HGB 8.9* 8.3*  HCT 26.1* 24.1*  PLT 205 196  NA 134* 130*  K 4.5 4.6  CL 101 99  CO2 23 24  BUN 37* 47*  CREATININE 1.18* 1.40*  GLUCOSE 202* 190*  CALCIUM 8.4 8.2*     Discharge Medications:     Medication List    STOP taking these medications        ciprofloxacin 500 MG tablet  Commonly known as:  CIPRO      TAKE these medications        aspirin 325 MG EC tablet  1 tab a day for the next 30 days to prevent blood clots     atorvastatin 10 MG tablet  Commonly known as:  LIPITOR  Take 10 mg by mouth daily after supper.     benazepril 40 MG tablet  Commonly known as:  LOTENSIN  Take 1 tablet (40 mg total) by mouth daily.     docusate sodium 100 MG capsule  Commonly known as:  COLACE  1 tab 2 times a day while on narcotics.  STOOL SOFTENER     fluticasone 50 MCG/ACT nasal spray  Commonly known as:  FLONASE  Place 2 sprays into both nostrils at bedtime.     furosemide 40 MG tablet  Commonly known as:  LASIX  Take 1 tablet (40 mg total) by mouth 2 (two) times daily.     HAIR/SKIN/NAILS/BIOTIN Tabs  Take 1 tablet by mouth daily.     ibuprofen 200 MG  tablet  Commonly known as:  ADVIL,MOTRIN  Take 200-400 mg by mouth every 4 (four) hours as needed (pain).     levothyroxine 100 MCG tablet  Commonly known as:  SYNTHROID, LEVOTHROID  Take 100 mcg by mouth daily before breakfast.     metFORMIN 500 MG tablet  Commonly known as:  GLUCOPHAGE  Take 500 mg by mouth daily with breakfast.     metoprolol succinate 50 MG 24 hr tablet  Commonly known as:  TOPROL-XL  Take 1 tab daily     oxyCODONE 5 MG immediate release tablet  Commonly known as:  Oxy IR/ROXICODONE  1 tablet every 4-6 hrs as needed for pain     potassium chloride SA 20 MEQ tablet  Commonly known as:  K-DUR,KLOR-CON  Take 1 tablet (20 mEq total) by mouth daily.     sertraline 100 MG tablet  Commonly known as:  ZOLOFT  Take 50 mg by mouth at bedtime.     spironolactone 25 MG tablet  Commonly known as:  ALDACTONE  Take 0.5 tablets (12.5 mg total) by mouth daily.     SYSTANE OP  Place 1 drop into both eyes 4 (four) times daily as needed (burning eyes).     trimethoprim 100 MG tablet  Commonly known as:  TRIMPEX  Take 100 mg by mouth daily after supper.     vitamin C 500 MG tablet  Commonly known as:  ASCORBIC ACID  Take 500 mg by mouth 2 (two) times daily.     Vitamin D3 5000 UNITS Tabs  Take 5,000 Units by mouth daily.     VITAMIN E PO  Take 1 tablet by mouth daily.        Diagnostic Studies: Dg Chest 2 View  05/07/2014   CLINICAL DATA:  CAD.  EXAM: CHEST  2 VIEW  COMPARISON:  06/15/2013.  FINDINGS: Mediastinum and hilar structures are normal. Lungs are clear. Prior CABG. Mild cardiomegaly. No pulmonary venous congestion. No pleural effusion or pneumothorax. No acute bony abnormality. Degenerative changes thoracic spine. Surgical clips over the neck.  IMPRESSION: 1. No acute cardiopulmonary disease. 2. Prior CABG.  Cardiomegaly.  Normal pulmonary vascularity.   Electronically Signed   By: Maisie Fushomas  Register   On: 05/07/2014 16:28   Nm Myocar Multi W/spect W/wall  Motion / Ef  04/23/2014   CLINICAL DATA:  Patient is an 81 yo with history of CAD  Test to evaluate, rule out ischemia.  EXAM: MYOCARDIAL IMAGING WITH SPECT (REST AND PHARMACOLOGIC-STRESS)  GATED LEFT VENTRICULAR WALL MOTION STUDY  LEFT VENTRICULAR EJECTION FRACTION  TECHNIQUE: Standard myocardial SPECT imaging was performed after resting intravenous injection of 10 mCi Tc-72m sestamibi. Subsequently, intravenous infusion of Lexiscan was performed under the supervision of the Cardiology staff. At peak effect of the drug, 30 mCi Tc-8m sestamibi was injected intravenously and standard myocardial SPECT imaging was performed. Quantitative gated imaging was also performed to evaluate left ventricular wall motion, and estimate left ventricular ejection fraction.  COMPARISON:  None.  FINDINGS: Stress data The patient underwent stress testing with Lexiscan Baseline EKG SR LBBB With infusion of lexiscan the EKG showed no change but was nondiagnostic due to baseline LBBB  Nuclear data: IN the initial stress images there was decreased tracer activity in the anteroseptal wall (mid, distal), There was a defect in the inferoseptal wall (base, mid) and basal septal wall.  IN the recovery images there was no significant change. Review of the raw data soft tissue (breast, diaphragm) surrounds the heart.  IMPRESSION: Lexiscan Cardiolite; Electrically xenodiagnostic for ischemia. Cardiolite scan with moderate area of fixed defect in the anteroseptal, inferoseptal and basal septal wall (see above) consistent with scar and possible soft tissue attenuation . No ischemia. LVEF on gating calculated at 67% with septal hypokinesis.   Electronically Signed   By: Dietrich Pates M.D.   On: 04/23/2014 16:20    Disposition:   Discharge Instructions    CPM    Complete by:  As directed   Continuous passive motion machine (CPM):      Use the CPM from 0 to 90 for 6 hours per day.       You may break it up into 2 or 3 sessions per day.       Use CPM for 2 weeks or until you are told to stop.     Call MD / Call 911    Complete by:  As directed   If you experience chest pain or shortness of breath, CALL 911 and be transported to the hospital emergency room.  If you develope a fever above 101 F, pus (white drainage) or increased drainage or redness at the wound, or calf pain, call your surgeon's office.     Change dressing    Complete by:  As directed   DO NOT REMOVE BANDAGE OVER SURGICAL INCISION.  WASH WHOLE LEG INCLUDING OVER THE WATERPROOF BANDAGE WITH SOAP AND WATER EVERY DAY.     Constipation Prevention    Complete by:  As directed   Drink plenty of fluids.  Prune juice may be helpful.  You may use a stool softener, such as Colace (over the counter) 100 mg twice a day.  Use MiraLax (over the counter) for constipation as needed.     Diet - low sodium heart healthy    Complete by:  As directed      Discharge instructions    Complete by:  As directed   Total Knee Replacement Care After Refer to this sheet in the next few weeks. These discharge instructions provide you with general information on caring for yourself after you leave the hospital. Your caregiver may also give you specific instructions. Your treatment has been planned according to the most current medical practices available, but unavoidable complications sometimes occur. If you have any problems or questions after discharge, please call your caregiver. Regaining a near full range of motion of your knee  within the first 3 to 6 weeks after surgery is critical. HOME CARE INSTRUCTIONS  You may resume a normal diet and activities as directed.  Perform exercises as directed.  Place gray foam block, curve side up under heel at all times except when in CPM or when walking.  DO NOT modify, tear, cut, or change in any way the gray foam block. You will receive physical therapy daily  Take showers instead of baths until informed otherwise.  You may shower on Friday.  Please wash  whole leg including wound with soap and water over aquacel dressing.  DO NOT REMOVE AQUACEL DRESSING.  DR Thurston Hole WILL TAKE IT OFF IN THE OFFICE AT THE POST OP APPOINTMENT  It is OK to take over-the-counter tylenol in addition to the oxycodone for pain, discomfort, or fever. Oxycodone is VERY constipating.  Please take stool softener twice a day and laxatives daily until bowels are regular Eat a well-balanced diet.  Avoid lifting or driving until you are instructed otherwise.  Make an appointment to see your caregiver for stitches (suture) or staple removal as directed.  If you have been sent home with a continuous passive motion machine (CPM machine), 0-90 degrees 6 hrs a day   2 hrs a shift SEEK MEDICAL CARE IF: You have swelling of your calf or leg.  You develop shortness of breath or chest pain.  You have redness, swelling, or increasing pain in the wound.  There is pus or any unusual drainage coming from the surgical site.  You notice a bad smell coming from the surgical site or dressing.  The surgical site breaks open after sutures or staples have been removed.  There is persistent bleeding from the suture or staple line.  You are getting worse or are not improving.  You have any other questions or concerns.  SEEK IMMEDIATE MEDICAL CARE IF:  You have a fever.  You develop a rash.  You have difficulty breathing.  You develop any reaction or side effects to medicines given.  Your knee motion is decreasing rather than improving.  MAKE SURE YOU:  Understand these instructions.  Will watch your condition.  Will get help right away if you are not doing well or get worse.     Do not put a pillow under the knee. Place it under the heel.    Complete by:  As directed   Place gray foam block, curve side up under heel at all times except when in CPM or when walking.  DO NOT modify, tear, cut, or change in any way the gray foam block.     Increase activity slowly as tolerated    Complete by:   As directed      TED hose    Complete by:  As directed   Use stockings (TED hose) for 2 weeks on both leg(s).  You may remove them at night for sleeping.           Follow-up Information    Follow up with Nilda Simmer, MD On 06/04/2014.   Specialty:  Orthopedic Surgery   Why:  appt time 3 pm in [facility] information:   [address] [place] [address] [phone]        Signed: Pascal Lux 05/22/2014, 7:25 AM

## 2014-10-08 ENCOUNTER — Ambulatory Visit (INDEPENDENT_AMBULATORY_CARE_PROVIDER_SITE_OTHER): Payer: Medicare Other | Admitting: Cardiology

## 2014-10-08 ENCOUNTER — Encounter: Payer: Self-pay | Admitting: Cardiology

## 2014-10-08 VITALS — BP 104/58 | HR 95 | Ht 64.0 in | Wt 144.0 lb

## 2014-10-08 DIAGNOSIS — Z953 Presence of xenogenic heart valve: Secondary | ICD-10-CM

## 2014-10-08 DIAGNOSIS — Z954 Presence of other heart-valve replacement: Secondary | ICD-10-CM | POA: Diagnosis not present

## 2014-10-08 DIAGNOSIS — I5032 Chronic diastolic (congestive) heart failure: Secondary | ICD-10-CM | POA: Diagnosis not present

## 2014-10-08 DIAGNOSIS — M79604 Pain in right leg: Secondary | ICD-10-CM

## 2014-10-08 DIAGNOSIS — R42 Dizziness and giddiness: Secondary | ICD-10-CM | POA: Diagnosis not present

## 2014-10-08 DIAGNOSIS — I2510 Atherosclerotic heart disease of native coronary artery without angina pectoris: Secondary | ICD-10-CM | POA: Diagnosis not present

## 2014-10-08 DIAGNOSIS — M79605 Pain in left leg: Secondary | ICD-10-CM

## 2014-10-08 MED ORDER — FUROSEMIDE 40 MG PO TABS: 40.0000 mg | ORAL_TABLET | Freq: Two times a day (BID) | ORAL | Status: DC

## 2014-10-08 MED ORDER — POTASSIUM CHLORIDE CRYS ER 20 MEQ PO TBCR: 20.0000 meq | EXTENDED_RELEASE_TABLET | Freq: Every day | ORAL | Status: DC

## 2014-10-08 MED ORDER — BENAZEPRIL HCL 20 MG PO TABS: 20.0000 mg | ORAL_TABLET | Freq: Every day | ORAL | Status: DC

## 2014-10-08 MED ORDER — ATORVASTATIN CALCIUM 10 MG PO TABS: 10.0000 mg | ORAL_TABLET | Freq: Every day | ORAL | Status: DC

## 2014-10-08 MED ORDER — METOPROLOL SUCCINATE ER 50 MG PO TB24: 50.0000 mg | ORAL_TABLET | Freq: Every day | ORAL | Status: DC

## 2014-10-08 MED ORDER — SPIRONOLACTONE 25 MG PO TABS: 12.5000 mg | ORAL_TABLET | Freq: Every day | ORAL | Status: DC

## 2014-10-08 NOTE — Patient Instructions (Signed)
Your physician has recommended you make the following change in your medication:  Decrease benazepril to 20 mg daily. You may break your 40 mg tablet in half daily until they are finished. Hold your atorvastatin (lipitor) for 2 weeks. Contact our office with the response to determine if it caused your symptoms. Continue all other medications the same. Your physician recommends that you schedule a follow-up appointment in: 6 months. You will receive a reminder letter in the mail in about 4 months reminding you to call and schedule your appointment. If you don't receive this letter, please contact our office.

## 2014-10-08 NOTE — Progress Notes (Signed)
Cardiology Office Note  Date: 10/08/2014   ID: Erin Herman, DOB January 15, 1928, MRN [id number]  PCP: Kirstie Peri, MD  Primary Cardiologist: Nona Dell, MD   Chief Complaint  Patient presents with  . Coronary Artery Disease  . Aortic valve disease    History of Present Illness: Erin Herman is an 81 y.o. female last seen in December 2015. She presents for a routine follow-up visit. In the interim she underwent left total knee arthroplasty in January, has been doing well. She reports no angina symptoms or increasing shortness of breath. She does mention to me that she sometimes feels weak and lightheaded, indicates that her blood pressure has been running somewhat low.  We reviewed her cardiac medications which are outlined below. Cardiac testing from December 2015 showed no clear ischemic defects with LVEF 67%.  She also states that she feels achiness in her legs, was questioning whether Lipitor could be related. Her most recent lab work is outlined below.  She does not report any problems with increasing shortness of breath beyond NYHA class II, no leg edema, orthopnea, or PND.   Past Medical History  Diagnosis Date  . CAD (coronary artery disease), native coronary artery     Multivessel status post CABG 2006  . Mixed hyperlipidemia   . Essential hypertension   . LBBB (left bundle branch block)   . Type 2 diabetes mellitus   . Aortic stenosis     Bioprosthetic AVR 2006  . Cardiomyopathy   . Postoperative nausea and vomiting     Morphine  . Hyperthyroidism   . Depression   . UTI (lower urinary tract infection)   . History of hiatal hernia   . Hx of migraines   . Arthritis   . Anemia     Past Surgical History  Procedure Laterality Date  . Total knee arthroplasty    . Coronary artery bypass graft  2006     Dr. Cornelius Moras - LIMA to LAD, SVG to circumflex  . Aortic valve replacement  2006    Dr. Cornelius Moras - 23 mm [place] stentless porcine valve  . Total knee  arthroplasty Left 05/20/2014    DR Thurston Hole  . Total knee arthroplasty Left 05/20/2014    Procedure: LEFT TOTAL KNEE ARTHROPLASTY;  Surgeon: Nilda Simmer, MD;  Location: [facility] OR;  Service: Orthopedics;  Laterality: Left;    Current Outpatient Prescriptions  Medication Sig Dispense Refill  . aspirin EC 325 MG EC tablet 1 tab a day for the next 30 days to prevent blood clots 30 tablet 0  . atorvastatin (LIPITOR) 10 MG tablet Take 10 mg by mouth daily after supper.     . Cholecalciferol (VITAMIN D3) 5000 UNITS TABS Take 5,000 Units by mouth daily.    Marland Kitchen docusate sodium (COLACE) 100 MG capsule 1 tab 2 times a day while on narcotics.  STOOL SOFTENER 10 capsule 0  . fluticasone (FLONASE) 50 MCG/ACT nasal spray Place 2 sprays into both nostrils at bedtime.   0  . furosemide (LASIX) 40 MG tablet Take 1 tablet (40 mg total) by mouth 2 (two) times daily. 180 tablet 3  . ibuprofen (ADVIL,MOTRIN) 200 MG tablet Take 200-400 mg by mouth every 4 (four) hours as needed (pain).    Marland Kitchen levothyroxine (SYNTHROID, LEVOTHROID) 100 MCG tablet Take 100 mcg by mouth daily before breakfast.   2  . metFORMIN (GLUCOPHAGE) 500 MG tablet Take 500 mg by mouth daily with breakfast.      . metoprolol succinate (  TOPROL-XL) 50 MG 24 hr tablet Take 1 tab daily (Patient taking differently: Take 50 mg by mouth daily. ) 90 tablet 3  . Multiple Vitamins-Minerals (HAIR/SKIN/NAILS/BIOTIN) TABS Take 1 tablet by mouth daily.    Marland Kitchen oxyCODONE (OXY IR/ROXICODONE) 5 MG immediate release tablet 1 tablet every 4-6 hrs as needed for pain 60 tablet 0  . Polyethyl Glycol-Propyl Glycol (SYSTANE OP) Place 1 drop into both eyes 4 (four) times daily as needed (burning eyes).    . potassium chloride SA (K-DUR,KLOR-CON) 20 MEQ tablet Take 1 tablet (20 mEq total) by mouth daily. (Patient taking differently: Take 20 mEq by mouth daily after supper. ) 90 tablet 3  . sertraline (ZOLOFT) 100 MG tablet Take 50 mg by mouth at bedtime.     Marland Kitchen spironolactone  (ALDACTONE) 25 MG tablet Take 0.5 tablets (12.5 mg total) by mouth daily. 90 tablet 3  . vitamin C (ASCORBIC ACID) 500 MG tablet Take 500 mg by mouth 2 (two) times daily.    Marland Kitchen VITAMIN E PO Take 1 tablet by mouth daily.    . benazepril (LOTENSIN) 20 MG tablet Take 1 tablet (20 mg total) by mouth daily. 90 tablet 3   No current facility-administered medications for this visit.    Allergies:  Morphine and Penicillins   Social History: The patient  reports that she has never smoked. She has never used smokeless tobacco. She reports that she does not drink alcohol or use illicit drugs.   ROS:  Please see the history of present illness. Otherwise, complete review of systems is positive for none.  All other systems are reviewed and negative.   Physical Exam: VS:  BP 104/58 mmHg  Pulse 95  Ht 5\' 4"  (1.626 m)  Wt 144 lb (65.318 kg)  BMI 24.71 kg/m2  SpO2 94%, BMI Body mass index is 24.71 kg/(m^2).  Wt Readings from Last 3 Encounters:  10/08/14 144 lb (65.318 kg)  05/20/14 145 lb (65.772 kg)  05/07/14 145 lb 8 oz (65.998 kg)     Pleasant elderly woman, appears comfortable at rest. HEENT: Conjunctiva and lids normal, oropharynx clear with moist mucosa. Neck: Supple, no elevated JVP or carotid bruits, no thyromegaly. Lungs: Clear to auscultation, nonlabored breathing at rest. Cardiac: Regular rate and rhythm, no S3, 2/6 basal systolic murmur, no pericardial rub. Abdomen: Soft, nontender, bowel sounds present, no guarding or rebound. Extremities: No pitting edema, left knee swollen, distal pulses 2+. Skin: Warm and dry. Musculoskeletal: No kyphosis. Neuropsychiatric: Alert and oriented x3, affect grossly appropriate.   ECG: Tracing from 04/04/2014 showed sinus rhythm with left bundle-branch block.   Recent Labwork: 05/07/2014: ALT 18; AST 25 05/22/2014: BUN 47*; Creatinine, Ser 1.40*; Hemoglobin 8.3*; Platelets 196; Potassium 4.6; Sodium 130*  09/17/2014: BUN 39, creatinine 1.1,  potassium 4.2, cholesterol 185, triglycerides 169, HDL 38, LDL 113, TSH 0.5, AST 20, ALT 14, Hgb 11.2, platelets 284   Other Studies Reviewed Today:  1. Echocardiogram from April 2015 showed mild LVH with LVEF 50-55%, grade 1 diastolic dysfunction with increased filling pressures, stable aortic bioprosthesis with mean gradient 13 mmHg and no aortic regurgitation, mild to moderate mitral regurgitation, mild to moderate left atrial enlargement, mildly dilated right ventricle with mildly reduced contraction, mild to moderate tricuspid regurgitation with RV-RA gradient 23 mmHg.  2. Lexiscan Cardiolite 04/23/2014: FINDINGS: Stress data The patient underwent stress testing with Lexiscan Baseline EKG SR LBBB With infusion of lexiscan the EKG showed no change but was nondiagnostic due to baseline LBBB  Nuclear  data: IN the initial stress images there was decreased tracer activity in the anteroseptal wall (mid, distal), There was a defect in the inferoseptal wall (base, mid) and basal septal wall.  IN the recovery images there was no significant change. Review of the raw data soft tissue (breast, diaphragm) surrounds the heart.  IMPRESSION: Lexiscan Cardiolite; Electrically xenodiagnostic for ischemia. Cardiolite scan with moderate area of fixed defect in the anteroseptal, inferoseptal and basal septal wall (see above) consistent with scar and possible soft tissue attenuation . No ischemia. LVEF on gating calculated at 67% with septal hypokinesis.   Assessment and Plan:  1. Symptomatically stable CAD status post CABG with low risk Cardiolite noted in December 2015. Plan is to continue medical therapy and observation.  2. Reported fatigue and occasional lightheadedness, low-normal blood pressure. We will reduce benazepril to 20 mg daily for now.  3. Reported leg achiness, stop Lipitor for a few weeks to see if her symptoms improve, and then call back. She has hyperlipidemia, recent LDL was  113.  4. History of bioprosthetic AVR in 2006, stable with mean gradient 13 mmHg by echocardiogram last year.  5. History of diastolic heart failure, clinically stable.  Current medicines were reviewed with the patient today.  Disposition: FU with me in 6 months.   Signed, Jonelle Sidle, MD, [facility] 10/08/2014 10:55 AM    [facility] at [facility] [address] [place], [place], [place] [address] Phone: [phone]; Fax: [phone]

## 2014-10-23 ENCOUNTER — Telehealth: Payer: Self-pay | Admitting: Cardiology

## 2014-10-23 NOTE — Telephone Encounter (Signed)
Patient states that she went off Lipitor 10-18-14. And she is not having any pain in her joints. She was told to contact the office.

## 2014-10-23 NOTE — Telephone Encounter (Signed)
We could consider switching to Crestor 5 mg daily to see if she tolerates this better. If she is able to take this, recheck FLP and LFT in 8 weeks.

## 2014-10-24 MED ORDER — ROSUVASTATIN CALCIUM 5 MG PO TABS: 5.0000 mg | ORAL_TABLET | Freq: Every day | ORAL | Status: DC

## 2014-10-24 NOTE — Telephone Encounter (Signed)
Patient informed and verbalized understanding of plan. Patient request crestor rx be sent to [facility]. Patient will contact our office to let us know if she was able to tolerate crestor and the lab work would be arranged then.

## 2014-12-26 ENCOUNTER — Telehealth: Payer: Self-pay | Admitting: *Deleted

## 2014-12-26 NOTE — Telephone Encounter (Signed)
-----   Message from Eustace Moore, LPN sent at 08/02/8117 12:58 PM EDT ----- Regarding: FOLLOW UP/FLP&LFT'S/ IN 8 WEEKS IF ABLE TO TOLERATE CRESTOR/MCDOWELL Patient will contact office if she has to stop the crestor. Contact patient in 8 weeks or when this comes if she hasn't contacted [place]. Lab work will be arranged if she can tolerate it.

## 2014-12-26 NOTE — Telephone Encounter (Signed)
Spoke with patient and she said that after taking crestor for one week, her symptoms of myalgia restarted and she stopped the crestor.

## 2015-04-01 ENCOUNTER — Ambulatory Visit (INDEPENDENT_AMBULATORY_CARE_PROVIDER_SITE_OTHER): Payer: Medicare Other | Admitting: Cardiology

## 2015-04-01 ENCOUNTER — Encounter: Payer: Self-pay | Admitting: Cardiology

## 2015-04-01 VITALS — BP 90/43 | HR 79 | Ht 64.0 in | Wt 146.6 lb

## 2015-04-01 DIAGNOSIS — I10 Essential (primary) hypertension: Secondary | ICD-10-CM

## 2015-04-01 DIAGNOSIS — I2510 Atherosclerotic heart disease of native coronary artery without angina pectoris: Secondary | ICD-10-CM | POA: Diagnosis not present

## 2015-04-01 DIAGNOSIS — Z954 Presence of other heart-valve replacement: Secondary | ICD-10-CM | POA: Diagnosis not present

## 2015-04-01 DIAGNOSIS — Z953 Presence of xenogenic heart valve: Secondary | ICD-10-CM

## 2015-04-01 MED ORDER — BENAZEPRIL HCL 10 MG PO TABS: 10.0000 mg | ORAL_TABLET | Freq: Every day | ORAL | Status: DC

## 2015-04-01 NOTE — Progress Notes (Signed)
Cardiology Office Note  Date: 04/01/2015   ID: Erin Herman, DOB 1927-08-04, MRN [id number]  PCP: Kirstie Peri, MD  Primary Cardiologist: Nona Dell, MD   Chief Complaint  Patient presents with  . Coronary Artery Disease  . Status post AVR   History of Present Illness: Erin Herman is an 81 y.o. female last seen in June. She comes in for a routine follow-up visit. No significant shortness of breath or angina symptoms reported. She has felt weak, blood pressure is low today. No palpitations or syncope. No fevers or chills.  At the last visit we reduced Lotensin dose and also had her stop Lipitor temporarily to see if leg discomfort improved. She did have improvement in symptoms and was switched to Crestor 5 mg daily. She states that she did not tolerate this and stopped after a week.  We discussed her medications today. We will continue to back off on the Lotensin dose. She prefers to stay off statin therapy for now, will follow-up with Dr. Sherryll Burger around the first of the year for physical with lab work.  Past Medical History  Diagnosis Date  . CAD (coronary artery disease), native coronary artery     Multivessel status post CABG 2006  . Mixed hyperlipidemia   . Essential hypertension   . LBBB (left bundle branch block)   . Type 2 diabetes mellitus (HCC)   . Aortic stenosis     Bioprosthetic AVR 2006  . Cardiomyopathy (HCC)   . Postoperative nausea and vomiting     Morphine  . Hyperthyroidism   . Depression   . UTI (lower urinary tract infection)   . History of hiatal hernia   . Hx of migraines   . Arthritis   . Anemia     Current Outpatient Prescriptions  Medication Sig Dispense Refill  . Cholecalciferol (VITAMIN D3) 5000 UNITS TABS Take 5,000 Units by mouth daily.    . fluticasone (FLONASE) 50 MCG/ACT nasal spray Place 2 sprays into both nostrils at bedtime.   0  . furosemide (LASIX) 40 MG tablet Take 1 tablet (40 mg total) by mouth 2 (two) times  daily. 180 tablet 3  . ibuprofen (ADVIL,MOTRIN) 200 MG tablet Take 200-400 mg by mouth every 4 (four) hours as needed (pain).    Marland Kitchen levothyroxine (SYNTHROID, LEVOTHROID) 100 MCG tablet Take 100 mcg by mouth daily before breakfast.   2  . metFORMIN (GLUCOPHAGE) 500 MG tablet Take 500 mg by mouth daily with breakfast.      . metoprolol succinate (TOPROL-XL) 50 MG 24 hr tablet Take 1 tablet (50 mg total) by mouth daily. 90 tablet 3  . Multiple Vitamins-Minerals (HAIR/SKIN/NAILS/BIOTIN) TABS Take 1 tablet by mouth daily.    Bertram Gala Glycol-Propyl Glycol (SYSTANE OP) Place 1 drop into both eyes 4 (four) times daily as needed (burning eyes).    . potassium chloride SA (K-DUR,KLOR-CON) 20 MEQ tablet Take 1 tablet (20 mEq total) by mouth daily after supper. 90 tablet 3  . sertraline (ZOLOFT) 100 MG tablet Take 50 mg by mouth at bedtime.     Marland Kitchen spironolactone (ALDACTONE) 25 MG tablet Take 0.5 tablets (12.5 mg total) by mouth daily. 45 tablet 3  . vitamin C (ASCORBIC ACID) 500 MG tablet Take 500 mg by mouth 2 (two) times daily.    Marland Kitchen VITAMIN E PO Take 1 tablet by mouth daily.    . benazepril (LOTENSIN) 10 MG tablet Take 1 tablet (10 mg total) by mouth daily. 90 tablet 3  No current facility-administered medications for this visit.   Allergies:  Morphine and Penicillins   Social History: The patient  reports that she has never smoked. She has never used smokeless tobacco. She reports that she does not drink alcohol or use illicit drugs.   ROS:  Please see the history of present illness. Otherwise, complete review of systems is positive for weakness.  All other systems are reviewed and negative.   Physical Exam: VS:  BP 90/43 mmHg  Pulse 79  Ht 5\' 4"  (1.626 m)  Wt 146 lb 9.6 oz (66.497 kg)  BMI 25.15 kg/m2  SpO2 96%, BMI Body mass index is 25.15 kg/(m^2).  Wt Readings from Last 3 Encounters:  04/01/15 146 lb 9.6 oz (66.497 kg)  10/08/14 144 lb (65.318 kg)  05/20/14 145 lb (65.772 kg)      Pleasant elderly woman, appears comfortable at rest. HEENT: Conjunctiva and lids normal, oropharynx clear with moist mucosa. Neck: Supple, no elevated JVP or carotid bruits, no thyromegaly. Lungs: Clear to auscultation, nonlabored breathing at rest. Cardiac: Regular rate and rhythm, no S3, 2/6 basal systolic murmur, no pericardial rub. Abdomen: Soft, nontender, bowel sounds present, no guarding or rebound. Extremities: No pitting edema, left knee swollen, distal pulses 2+.  ECG: ECG is not ordered today.  Recent Labwork: 05/07/2014: ALT 18; AST 25 05/22/2014: BUN 47*; Creatinine, Ser 1.40*; Hemoglobin 8.3*; Platelets 196; Potassium 4.6; Sodium 130*   Other Studies Reviewed Today:  1. Echocardiogram from April 2015 showed mild LVH with LVEF 50-55%, grade 1 diastolic dysfunction with increased filling pressures, stable aortic bioprosthesis with mean gradient 13 mmHg and no aortic regurgitation, mild to moderate mitral regurgitation, mild to moderate left atrial enlargement, mildly dilated right ventricle with mildly reduced contraction, mild to moderate tricuspid regurgitation with RV-RA gradient 23 mmHg.  2. Lexiscan Cardiolite 04/23/2014: FINDINGS: Stress data The patient underwent stress testing with Lexiscan Baseline EKG SR LBBB With infusion of lexiscan the EKG showed no change but was nondiagnostic due to baseline LBBB  Nuclear data: IN the initial stress images there was decreased tracer activity in the anteroseptal wall (mid, distal), There was a defect in the inferoseptal wall (base, mid) and basal septal wall.  IN the recovery images there was no significant change. Review of the raw data soft tissue (breast, diaphragm) surrounds the heart.  IMPRESSION: Lexiscan Cardiolite; Electrically xenodiagnostic for ischemia. Cardiolite scan with moderate area of fixed defect in the anteroseptal, inferoseptal and basal septal wall (see above) consistent with scar and possible soft  tissue attenuation . No ischemia. LVEF on gating calculated at 67% with septal hypokinesis.  Assessment and Plan:  1. History of essential hypertension, blood pressure is low today and she also has described weakness. Plan to further reduce Lotensin to 10 mg daily, may need to reduce further or stop depending on her blood pressure response. Keep follow-up with Dr. Sherryll BurgerShah.  2. CAD status post CABG, no active angina symptoms, and overall low risk ischemic testing documented a year ago.  3. History of bioprosthetic AVR, stabilizer echocardiogram last year.  Current medicines were reviewed with the patient today.  Disposition: FU with me in 6 months.   Signed, Jonelle SidleSamuel G. Glennice Marcos, MD, [facility] 04/01/2015 2:12 PM    [facility] [address], [place], [place] [address] Phone: [phone]; Fax: [phone]

## 2015-04-01 NOTE — Patient Instructions (Signed)
Your physician wants you to follow-up in: 6 MONTHS WITH DR. Diona BrownerMCDOWELL You will receive a reminder letter in the mail two months in advance. If you don't receive a letter, please call our office to schedule the follow-up appointment.  Your physician has recommended you make the following change in your medication:   DECREASE BENAZEPRIL 10 MG DAILY  Thank you for choosing [facility]!!

## 2015-07-11 ENCOUNTER — Other Ambulatory Visit: Payer: Self-pay | Admitting: *Deleted

## 2015-07-11 DIAGNOSIS — I5032 Chronic diastolic (congestive) heart failure: Secondary | ICD-10-CM

## 2015-07-11 MED ORDER — METOPROLOL SUCCINATE ER 50 MG PO TB24: 50.0000 mg | ORAL_TABLET | Freq: Every day | ORAL | Status: DC

## 2015-08-08 ENCOUNTER — Telehealth: Payer: Self-pay | Admitting: Cardiology

## 2015-08-08 NOTE — Telephone Encounter (Signed)
Erin Herman called stating that she is now going with [facility] for all prescriptions. She is requesting that all medications [facility]

## 2015-08-08 NOTE — Telephone Encounter (Signed)
Updated pharmacy

## 2015-08-13 ENCOUNTER — Other Ambulatory Visit: Payer: Self-pay | Admitting: *Deleted

## 2015-08-13 DIAGNOSIS — I5032 Chronic diastolic (congestive) heart failure: Secondary | ICD-10-CM

## 2015-08-13 MED ORDER — SPIRONOLACTONE 25 MG PO TABS: 12.5000 mg | ORAL_TABLET | Freq: Every day | ORAL | Status: DC

## 2015-08-13 MED ORDER — FUROSEMIDE 40 MG PO TABS: 40.0000 mg | ORAL_TABLET | Freq: Two times a day (BID) | ORAL | Status: DC

## 2015-08-13 MED ORDER — POTASSIUM CHLORIDE CRYS ER 20 MEQ PO TBCR: 20.0000 meq | EXTENDED_RELEASE_TABLET | Freq: Every day | ORAL | Status: DC

## 2015-08-19 ENCOUNTER — Other Ambulatory Visit: Payer: Self-pay | Admitting: Cardiology

## 2015-08-19 MED ORDER — BENAZEPRIL HCL 10 MG PO TABS
10.0000 mg | ORAL_TABLET | Freq: Every day | ORAL | Status: DC
Start: 2015-08-19 — End: 2015-11-21

## 2015-08-19 NOTE — Telephone Encounter (Signed)
Medication sent to pharmacy  

## 2015-08-19 NOTE — Telephone Encounter (Signed)
Refill:  benazepril (LOTENSIN) 10 MG  [facility].

## 2015-10-01 ENCOUNTER — Encounter: Payer: Self-pay | Admitting: Cardiology

## 2015-10-01 ENCOUNTER — Ambulatory Visit (INDEPENDENT_AMBULATORY_CARE_PROVIDER_SITE_OTHER): Payer: Medicare Other | Admitting: Cardiology

## 2015-10-01 VITALS — BP 128/60 | HR 72 | Ht 64.0 in | Wt 148.0 lb

## 2015-10-01 DIAGNOSIS — I10 Essential (primary) hypertension: Secondary | ICD-10-CM | POA: Diagnosis not present

## 2015-10-01 DIAGNOSIS — I447 Left bundle-branch block, unspecified: Secondary | ICD-10-CM

## 2015-10-01 DIAGNOSIS — I2510 Atherosclerotic heart disease of native coronary artery without angina pectoris: Secondary | ICD-10-CM

## 2015-10-01 DIAGNOSIS — Z889 Allergy status to unspecified drugs, medicaments and biological substances status: Secondary | ICD-10-CM

## 2015-10-01 DIAGNOSIS — Z953 Presence of xenogenic heart valve: Secondary | ICD-10-CM

## 2015-10-01 DIAGNOSIS — E785 Hyperlipidemia, unspecified: Secondary | ICD-10-CM | POA: Diagnosis not present

## 2015-10-01 DIAGNOSIS — Z789 Other specified health status: Secondary | ICD-10-CM

## 2015-10-01 DIAGNOSIS — Z954 Presence of other heart-valve replacement: Secondary | ICD-10-CM | POA: Diagnosis not present

## 2015-10-01 NOTE — Patient Instructions (Signed)
Your physician recommends that you continue on your current medications as directed. Please refer to the Current Medication list given to you today. Your physician recommends that you schedule a follow-up appointment in: 6 months. You will receive a reminder letter in the mail in about 4 months reminding you to call and schedule your appointment. If you don't receive this letter, please contact our office. 

## 2015-10-01 NOTE — Progress Notes (Signed)
Cardiology Office Note  Date: 10/01/2015   ID: Erin Herman, DOB [birth year], MRN [id number]  PCP: Kirstie Peri, MD  Primary Cardiologist: Nona Dell, MD   Chief Complaint  Patient presents with  . Coronary Artery Disease  . Status post AVR    History of Present Illness: Erin Herman is an 82 y.o. female last seen in December 2016. She presents for a routine follow-up visit today. No reported angina symptoms on current medical regimen. She has been taking care of her husband who recently had surgery for spinal stenosis.  I reviewed her ECG today which shows sinus rhythm with prolonged PR interval and left bundle branch block.  She has a history of statin intolerance. I went over her recent lab work which is outlined below. Lipids are not well controlled. She is reluctant to try any other medications for this however. Cardiac regimen includes Toprol-XL, Lotensin, Lasix, potassium supplements, and Aldactone. Renal function and potassium are normal.  Past Medical History  Diagnosis Date  . CAD (coronary artery disease), native coronary artery     Multivessel status post CABG 2006  . Mixed hyperlipidemia   . Essential hypertension   . LBBB (left bundle branch block)   . Type 2 diabetes mellitus (HCC)   . Aortic stenosis     Bioprosthetic AVR 2006  . Cardiomyopathy (HCC)   . Postoperative nausea and vomiting     Morphine  . Hyperthyroidism   . Depression   . UTI (lower urinary tract infection)   . History of hiatal hernia   . Hx of migraines   . Arthritis   . Anemia     Past Surgical History  Procedure Laterality Date  . Total knee arthroplasty    . Coronary artery bypass graft  2006     Dr. Cornelius Moras - LIMA to LAD, SVG to circumflex  . Aortic valve replacement  2006    Dr. Cornelius Moras - 23 mm [place] stentless porcine valve  . Total knee arthroplasty Left 05/20/2014    DR Thurston Hole  . Total knee arthroplasty Left 05/20/2014    Procedure: LEFT TOTAL KNEE  ARTHROPLASTY;  Surgeon: Nilda Simmer, MD;  Location: [facility] OR;  Service: Orthopedics;  Laterality: Left;    Current Outpatient Prescriptions  Medication Sig Dispense Refill  . benazepril (LOTENSIN) 10 MG tablet Take 1 tablet (10 mg total) by mouth daily. 90 tablet 3  . Cholecalciferol (VITAMIN D3) 5000 UNITS TABS Take 5,000 Units by mouth daily.    . fluticasone (FLONASE) 50 MCG/ACT nasal spray Place 2 sprays into both nostrils at bedtime.   0  . furosemide (LASIX) 40 MG tablet Take 1 tablet (40 mg total) by mouth 2 (two) times daily. 180 tablet 3  . ibuprofen (ADVIL,MOTRIN) 200 MG tablet Take 200-400 mg by mouth every 4 (four) hours as needed (pain).    Marland Kitchen levothyroxine (SYNTHROID, LEVOTHROID) 100 MCG tablet Take 100 mcg by mouth daily before breakfast.   2  . metFORMIN (GLUCOPHAGE) 500 MG tablet Take 500 mg by mouth daily with breakfast.      . metoprolol succinate (TOPROL-XL) 50 MG 24 hr tablet Take 1 tablet (50 mg total) by mouth daily. 90 tablet 3  . Multiple Vitamins-Minerals (HAIR/SKIN/NAILS/BIOTIN) TABS Take 1 tablet by mouth daily.    Bertram Gala Glycol-Propyl Glycol (SYSTANE OP) Place 1 drop into both eyes 4 (four) times daily as needed (burning eyes).    . potassium chloride SA (K-DUR,KLOR-CON) 20 MEQ tablet Take 1 tablet (20 mEq  total) by mouth daily after supper. 90 tablet 3  . sertraline (ZOLOFT) 100 MG tablet Take 50 mg by mouth at bedtime.     Marland Kitchen. spironolactone (ALDACTONE) 25 MG tablet Take 0.5 tablets (12.5 mg total) by mouth daily. 45 tablet 3  . vitamin C (ASCORBIC ACID) 500 MG tablet Take 500 mg by mouth 2 (two) times daily.    Marland Kitchen. VITAMIN E PO Take 1 tablet by mouth daily.     No current facility-administered medications for this visit.   Allergies:  Morphine and Penicillins   Social History: The patient  reports that she has never smoked. She has never used smokeless tobacco. She reports that she does not drink alcohol or use illicit drugs.   ROS:  Please see the history  of present illness. Otherwise, complete review of systems is positive for chronic back pain.  All other systems are reviewed and negative.   Physical Exam: VS:  BP 128/60 mmHg  Pulse 72  Ht 5\' 4"  (1.626 m)  Wt 148 lb (67.132 kg)  BMI 25.39 kg/m2  SpO2 98%, BMI Body mass index is 25.39 kg/(m^2).  Wt Readings from Last 3 Encounters:  10/01/15 148 lb (67.132 kg)  04/01/15 146 lb 9.6 oz (66.497 kg)  10/08/14 144 lb (65.318 kg)    Pleasant elderly woman, appears comfortable at rest. HEENT: Conjunctiva and lids normal, oropharynx clear with moist mucosa. Neck: Supple, no elevated JVP or carotid bruits, no thyromegaly. Lungs: Clear to auscultation, nonlabored breathing at rest. Cardiac: Regular rate and rhythm, no S3, 2/6 basal systolic murmur, no pericardial rub. Abdomen: Soft, nontender, bowel sounds present, no guarding or rebound. Extremities: No pitting edema, left knee swollen, distal pulses 2+.  ECG: I personally reviewed the prior tracing from 04/04/2014 which showed sinus rhythm with left bundle branch block.  Recent Labwork:  January 2016: Potassium 4.6, BUN 47, creatinine 1.28 Aug 2015: BUN 35, creatinine 1.1, potassium 4.5, Hgb 11.5, platelets 287, cholesterol 277, HDL 43, LDL 207, triglycerides 137, AST 20, ALT 12  Other Studies Reviewed Today:  Echocardiogram April 2015: Mild LVH with LVEF 50-55%, grade 1 diastolic dysfunction with increased filling pressures, stable aortic bioprosthesis with mean gradient 13 mmHg and no aortic regurgitation, mild to moderate mitral regurgitation, mild to moderate left atrial enlargement, mildly dilated right ventricle with mildly reduced contraction, mild to moderate tricuspid regurgitation with RV-RA gradient 23 mmHg.  Lexiscan Cardiolite 04/23/2014: FINDINGS: Stress data The patient underwent stress testing with Lexiscan Baseline EKG SR LBBB With infusion of lexiscan the EKG showed no change but was nondiagnostic due to baseline  LBBB  Nuclear data: IN the initial stress images there was decreased tracer activity in the anteroseptal wall (mid, distal), There was a defect in the inferoseptal wall (base, mid) and basal septal wall.  IN the recovery images there was no significant change. Review of the raw data soft tissue (breast, diaphragm) surrounds the heart.  IMPRESSION: Lexiscan Cardiolite; Electrically xenodiagnostic for ischemia. Cardiolite scan with moderate area of fixed defect in the anteroseptal, inferoseptal and basal septal wall (see above) consistent with scar and possible soft tissue attenuation . No ischemia. LVEF on gating calculated at 67% with septal hypokinesis.  Assessment and Plan:  1. Symptomatically stable CAD status post CABG as outlined above. Ischemic testing from within the last 2 years showed no substantial ischemic burden with normal LVEF. Continue observation on medical therapy.  2. History of aortic stenosis status post bioprosthetic AVR. No change on examination with echocardiogram from  2015 reviewed above.  3. Hyperlipidemia with statin intolerance.  4. Essential hypertension, blood pressure is well controlled today.  5. Left bundle branch block, old.  Current medicines were reviewed with the patient today.   Orders Placed This Encounter  Procedures  . EKG 12-Lead    Disposition: FU with me in 6 months.   Signed, Jonelle Sidle, MD, [facility] 10/01/2015 1:58 PM     [facility] at [facility] [address] [place], [place], [place] [address] Phone: [phone]; Fax: [phone]

## 2015-10-17 NOTE — Progress Notes (Signed)
This serves as an addendum to the recent office note from June 7. I received [facility] management of patient's lumbar spondylosis with myelopathy. From a cardiac perspective she has been stable without any significant angina symptoms on medical therapy. She had a Cardiolite study within the last 2 years that was overall low risk with normal LVEF. She has a bioprosthetic AVR in place and is not on anticoagulation. I anticipate that she should be able to proceed with surgical management if felt to be indicated at an acceptable perioperative cardiac risk, overall intermediate range in light of patient's advanced age.  Jonelle SidleSamuel G. McDowell, M.D., F.A.C.C.

## 2015-11-19 ENCOUNTER — Telehealth: Payer: Self-pay | Admitting: *Deleted

## 2015-11-19 NOTE — Telephone Encounter (Signed)
Spoke with patient who calls today saying that she was recently admitted to [facility] for dehydration and electrolyte imbalances. Patient said she was given a lot of IV fluids while inpatient at [facility]. Patient said that now she is still having swelling in feet and legs. Patient confirmed with nurse that she is taking furosemide 40 mg twice daily and spironolactone 12.5 mg daily. Patient denies, chest pain, sob, dizziness, n/v. Patient said her only c/o is swelling in her feet and legs. Patient advised to contact her PCP first and if her PCP felt her problem was cardiac related, to have them contact our office for an appointment. Patient also advised that message would be sent to doctor for further advice. Patient verbalized understanding of plan.

## 2015-11-20 ENCOUNTER — Encounter: Payer: Self-pay | Admitting: *Deleted

## 2015-11-21 ENCOUNTER — Ambulatory Visit (INDEPENDENT_AMBULATORY_CARE_PROVIDER_SITE_OTHER): Payer: Medicare Other | Admitting: Cardiology

## 2015-11-21 ENCOUNTER — Encounter: Payer: Self-pay | Admitting: Cardiology

## 2015-11-21 VITALS — BP 88/53 | HR 77 | Ht 64.0 in | Wt 144.2 lb

## 2015-11-21 DIAGNOSIS — I959 Hypotension, unspecified: Secondary | ICD-10-CM

## 2015-11-21 DIAGNOSIS — I2510 Atherosclerotic heart disease of native coronary artery without angina pectoris: Secondary | ICD-10-CM | POA: Diagnosis not present

## 2015-11-21 DIAGNOSIS — I5032 Chronic diastolic (congestive) heart failure: Secondary | ICD-10-CM | POA: Diagnosis not present

## 2015-11-21 DIAGNOSIS — Z954 Presence of other heart-valve replacement: Secondary | ICD-10-CM

## 2015-11-21 DIAGNOSIS — Z953 Presence of xenogenic heart valve: Secondary | ICD-10-CM

## 2015-11-21 MED ORDER — BENAZEPRIL HCL 10 MG PO TABS: 10.0000 mg | ORAL_TABLET | ORAL | 3 refills | Status: DC

## 2015-11-21 NOTE — Progress Notes (Signed)
Cardiology Office Note  Date: 11/21/2015   ID: Erin Herman, DOB [birth year], MRN [id number]  PCP: Kirstie Peri, MD  Primary Cardiologist: Nona Dell, MD   Chief Complaint  Patient presents with  . Leg Swelling    History of Present Illness: Erin Herman is an 82 y.o. female last seen in June. I reviewed interval records. She was recently admitted to [facility] in July with nausea and weakness, reportedly received Reclast treatment for osteoporosis a few days prior to hospital stay. She was diagnosed with a urinary tract infection and hospitalized on IV antibiotics. Lab work and urine culture outlined below. She apparently received IV fluids during her stay as well, called the office reporting leg swelling.  She saw Dr. Sherryll Burger in the office just recently. No medication changes were made. Patient is here with her daughter, states that she feels very weak. Reports ankle edema and abdominal fullness, both slowly improving. She has been on stable diuretic regimen. Weight is down 4 pounds from June. She does not report any obvious fevers or chills. No dysuria. Completed antibiotics yesterday.  Blood pressure is low today. I reviewed her medications. She does not report any obvious orthopnea or PND.  Last echocardiogram was in 2015 is outlined below. I reviewed her ECG today which shows sinus rhythm with old left bundle branch block.  Past Medical History:  Diagnosis Date  . Anemia   . Aortic stenosis    Bioprosthetic AVR 2006  . Arthritis   . CAD (coronary artery disease), native coronary artery    Multivessel status post CABG 2006  . Cardiomyopathy (HCC)   . Depression   . Essential hypertension   . History of hiatal hernia   . Hx of migraines   . Hyperthyroidism   . LBBB (left bundle branch block)   . Mixed hyperlipidemia   . Postoperative nausea and vomiting    Morphine  . Type 2 diabetes mellitus (HCC)   . UTI (lower urinary tract infection)     Past  Surgical History:  Procedure Laterality Date  . AORTIC VALVE REPLACEMENT  2006   Dr. Cornelius Moras - 23 mm [place] stentless porcine valve  . CORONARY ARTERY BYPASS GRAFT  2006    Dr. Cornelius Moras - LIMA to LAD, SVG to circumflex  . TOTAL KNEE ARTHROPLASTY    . TOTAL KNEE ARTHROPLASTY Left 05/20/2014   DR Thurston Hole  . TOTAL KNEE ARTHROPLASTY Left 05/20/2014   Procedure: LEFT TOTAL KNEE ARTHROPLASTY;  Surgeon: Nilda Simmer, MD;  Location: [facility] OR;  Service: Orthopedics;  Laterality: Left;    Current Outpatient Prescriptions  Medication Sig Dispense Refill  . benazepril (LOTENSIN) 10 MG tablet Take 1 tablet (10 mg total) by mouth as directed. 90 tablet 3  . Cholecalciferol (VITAMIN D3) 5000 UNITS TABS Take 5,000 Units by mouth daily.    . fluticasone (FLONASE) 50 MCG/ACT nasal spray Place 2 sprays into both nostrils at bedtime.   0  . furosemide (LASIX) 40 MG tablet Take 1 tablet (40 mg total) by mouth 2 (two) times daily. 180 tablet 3  . ibuprofen (ADVIL,MOTRIN) 200 MG tablet Take 200-400 mg by mouth every 4 (four) hours as needed (pain).    Marland Kitchen levothyroxine (SYNTHROID, LEVOTHROID) 100 MCG tablet Take 100 mcg by mouth daily before breakfast.   2  . metFORMIN (GLUCOPHAGE) 500 MG tablet Take 500 mg by mouth daily with breakfast.      . Multiple Vitamins-Minerals (HAIR/SKIN/NAILS/BIOTIN) TABS Take 1 tablet by mouth daily.    Marland Kitchen  Polyethyl Glycol-Propyl Glycol (SYSTANE OP) Place 1 drop into both eyes 4 (four) times daily as needed (burning eyes).    . potassium chloride SA (K-DUR,KLOR-CON) 20 MEQ tablet Take 1 tablet (20 mEq total) by mouth daily after supper. 90 tablet 3  . Potassium Phosphate Dibasic GRAN Take 1 tablet by mouth 2 (two) times daily.    . sertraline (ZOLOFT) 100 MG tablet Take 50 mg by mouth at bedtime.     Marland Kitchen spironolactone (ALDACTONE) 25 MG tablet Take 0.5 tablets (12.5 mg total) by mouth daily. 45 tablet 3  . vitamin C (ASCORBIC ACID) 500 MG tablet Take 500 mg by mouth 2 (two) times daily.    Marland Kitchen  VITAMIN E PO Take 1 tablet by mouth daily.    . metoprolol succinate (TOPROL-XL) 50 MG 24 hr tablet Take 1 tablet (50 mg total) by mouth daily. 90 tablet 3   No current facility-administered medications for this visit.    Allergies:  Morphine and Penicillins   Social History: The patient  reports that she has never smoked. She has never used smokeless tobacco. She reports that she does not drink alcohol or use drugs.   ROS:  Please see the history of present illness. Otherwise, complete review of systems is positive for mild nausea, weakness and fatigue.  All other systems are reviewed and negative.   Physical Exam: VS:  BP (!) 88/53   Pulse 77   Ht  (1.626 m)   Wt 144 lb 3.2 oz (65.4 kg)   SpO2 95%   BMI 24.75 kg/m , BMI Body mass index is 24.75 kg/m.  Wt Readings from Last 3 Encounters:  11/21/15 144 lb 3.2 oz (65.4 kg)  10/01/15 148 lb (67.1 kg)  04/01/15 146 lb 9.6 oz (66.5 kg)    Pleasant elderly woman, no distress. HEENT: Conjunctiva and lids normal, oropharynx clear. Neck: Supple, no elevated JVP or carotid bruits, no thyromegaly. Lungs: Clear to auscultation, nonlabored breathing at rest. Cardiac: Regular rate and rhythm with occasional ectopic beat, no S3, 2/6 basal systolic murmur, no pericardial rub. Abdomen: Nontender, bowel sounds present, no guarding or rebound. Extremities: Trace ankle edema, distal pulses 2+. Skin: Warm and dry. Musculoskeletal: No kyphosis Neuropsychiatric: Alert and oriented 3, affect appropriate.  ECG: I personally reviewed the tracing from 11/13/2015 which showed sinus rhythm with PVCs, IVCD.  Recent Labwork:  July 2017: BUN 13, creatinine 0.7, potassium 3.4, AST 37, ALT 17, troponin T negative, hemoglobin 11.2, platelets 171, urine culture positive for Enterobacter cloacae  Other Studies Reviewed Today:  Lexiscan Cardiolite 04/23/2014: FINDINGS: Stress data The patient underwent stress testing with Lexiscan Baseline EKG SR  LBBB With infusion of lexiscan the EKG showed no change but was nondiagnostic due to baseline LBBB  Nuclear data: IN the initial stress images there was decreased tracer activity in the anteroseptal wall (mid, distal), There was a defect in the inferoseptal wall (base, mid) and basal septal wall.  IN the recovery images there was no significant change. Review of the raw data soft tissue (breast, diaphragm) surrounds the heart.  IMPRESSION: Lexiscan Cardiolite; Electrically xenodiagnostic for ischemia. Cardiolite scan with moderate area of fixed defect in the anteroseptal, inferoseptal and basal septal wall (see above) consistent with scar and possible soft tissue attenuation . No ischemia. LVEF on gating calculated at 67% with septal hypokinesis.  Echocardiogram April 2015: Mild LVH with LVEF 50-55%, grade 1 diastolic dysfunction with increased filling pressures, stable aortic bioprosthesis with mean gradient 13 mmHg and  no aortic regurgitation, mild to moderate mitral regurgitation, mild to moderate left atrial enlargement, mildly dilated right ventricle with mildly reduced contraction, mild to moderate tricuspid regurgitation with RV-RA gradient 23 mmHg.  Chest x-ray 11/13/2015 [facility]): Calcific aortic atherosclerosis, no active disease.  Assessment and Plan:  1. Recent reported weakness and fatigue after hospitalization with UTI. Patient did receive IV fluids with Lasix held during her hospital observation, had some mild ankle edema and increased abdominal fullness, but these seem to be improving. LVEF was 50-55% as of 2015. At least at this point, doubt acute diastolic heart failure as etiology. Blood pressure is low today which could be contributing. I have asked her to cut Lotensin back to 5 mg daily, hold it altogether if her systolic blood pressure is under 100 when she checks it in the morning. Keep an eye out for possibility of persistent UTI, she should check her temperature  each day and report to Dr. Sherryll Burger if fevers develop over the next week. She does report a feeling of palpitations around her hospital stay, no obvious change in rhythm. I reviewed her follow-up ECG.  2. Multivessel CAD status post CABG in 2006. Lexiscan Cardiolite in December 2015 did not reveal any significant ischemic defects. Troponin T levels were normal during recent hospital stay at [facility]. Continue observation.  3. Trivial aortic stenosis status post bioprosthetic AVR 2006, functioning normally as of echocardiogram April 2015. Follow-up echocardiogram is planned for reassessment of cardiac structure and function in light of recent symptoms.  4. History of hypertension, blood pressure low at present. Lotensin being adjusted as outlined. We will try to continue her metoprolol for now current dose.  Current medicines were reviewed with the patient today.   Orders Placed This Encounter  Procedures  . EKG 12-Lead  . ECHOCARDIOGRAM COMPLETE    Disposition: Follow-up with me in the next few weeks.  Signed, Jonelle Sidle, MD, [facility] 11/21/2015 11:13 AM    [facility] at [facility] [address] [place], [place], [place] [address] Phone: [phone]; Fax: [phone]

## 2015-11-21 NOTE — Patient Instructions (Addendum)
Medication Instructions:   Your physician has recommended you make the following change in your medication:   Please follow these instructions for taking your lotensin: Check your blood pressure in the morning, if your systolic blood pressure (top number) is below 100 do not take your lotension. If your systolic blood pressure (top number) is between 110-120, take 1/2 of your lotensin to equal 5 mg.   Continue all other medications the same.  Labwork: NONE  Testing/Procedures: Your physician has requested that you have an echocardiogram. Echocardiography is a painless test that uses sound waves to create images of your heart. It provides your doctor with information about the size and shape of your heart and how well your heart's chambers and valves are working. This procedure takes approximately one hour. There are no restrictions for this procedure.  Follow-Up:  Your physician recommends that you schedule a follow-up appointment in: 3 weeks.  Any Other Special Instructions Will Be Listed Below (If Applicable).  If you need a refill on your cardiac medications before your next appointment, please call your pharmacy.

## 2015-12-11 ENCOUNTER — Other Ambulatory Visit: Payer: Self-pay

## 2015-12-11 ENCOUNTER — Ambulatory Visit (INDEPENDENT_AMBULATORY_CARE_PROVIDER_SITE_OTHER): Payer: Medicare Other

## 2015-12-11 DIAGNOSIS — I2510 Atherosclerotic heart disease of native coronary artery without angina pectoris: Secondary | ICD-10-CM

## 2015-12-15 ENCOUNTER — Telehealth: Payer: Self-pay | Admitting: *Deleted

## 2015-12-15 NOTE — Telephone Encounter (Signed)
Patient informed and copy sent to PCP. 

## 2015-12-15 NOTE — Telephone Encounter (Signed)
-----   Message from Jonelle SidleSamuel G McDowell, MD sent at 12/11/2015  5:21 PM EDT ----- Results reviewed. LVEF remains normal. There is moderate diastolic dysfunction as well as mild RV dysfunction with elevated pulmonary pressures. This could contribute to her leg edema. We will see how she does and determine if any additional medication adjustments need to be made. Aortic valve function looks to be preserved. [facility] of [facility] test [facility].

## 2015-12-18 ENCOUNTER — Encounter: Payer: Self-pay | Admitting: *Deleted

## 2015-12-19 ENCOUNTER — Encounter: Payer: Self-pay | Admitting: Cardiology

## 2015-12-19 ENCOUNTER — Ambulatory Visit (INDEPENDENT_AMBULATORY_CARE_PROVIDER_SITE_OTHER): Payer: Medicare Other | Admitting: Cardiology

## 2015-12-19 VITALS — BP 120/58 | HR 69 | Ht 64.0 in | Wt 145.0 lb

## 2015-12-19 DIAGNOSIS — I2510 Atherosclerotic heart disease of native coronary artery without angina pectoris: Secondary | ICD-10-CM

## 2015-12-19 DIAGNOSIS — Z953 Presence of xenogenic heart valve: Secondary | ICD-10-CM

## 2015-12-19 DIAGNOSIS — Z954 Presence of other heart-valve replacement: Secondary | ICD-10-CM

## 2015-12-19 DIAGNOSIS — I10 Essential (primary) hypertension: Secondary | ICD-10-CM

## 2015-12-19 DIAGNOSIS — I5032 Chronic diastolic (congestive) heart failure: Secondary | ICD-10-CM

## 2015-12-19 NOTE — Progress Notes (Signed)
Cardiology Office Note  Date: 12/19/2015   ID: Erin Herman, DOB [birth year], MRN [id number]  PCP: Erin Herman,ASHISH, MD  Primary Cardiologist: Erin DellSamuel Tonyia Marschall, MD   Chief Complaint  Patient presents with  . Cardiac follow-up    History of Present Illness: Erin Herman is an 82 y.o. female seen recently in July. She presents for a follow-up visit today with her husband. Overall seems to be doing better, leg edema has improved significantly. States she that she feels some puffiness over her eyes, but otherwise no increased shortness of breath. Her blood pressure looks much better with systolics in the 120s to 130s.  At the last visit we cut Lotensin back to 5 mg daily. On her own she cut Lasix back to 20 mg twice daily. We talked about changing it to 40 mg in the morning and 20 mg in the evening.  Follow-up echocardiogram is reviewed below. I went over the results with her today.  Past Medical History:  Diagnosis Date  . Anemia   . Aortic stenosis    Bioprosthetic AVR 2006  . Arthritis   . CAD (coronary artery disease), native coronary artery    Multivessel status post CABG 2006  . Cardiomyopathy (HCC)   . Depression   . Essential hypertension   . History of hiatal hernia   . Hx of migraines   . Hyperthyroidism   . LBBB (left bundle branch block)   . Mixed hyperlipidemia   . Postoperative nausea and vomiting    Morphine  . Type 2 diabetes mellitus (HCC)   . UTI (lower urinary tract infection)     Past Surgical History:  Procedure Laterality Date  . AORTIC VALVE REPLACEMENT  2006   Dr. Cornelius Herman - 23 mm [place] stentless porcine valve  . CORONARY ARTERY BYPASS GRAFT  2006    Dr. Cornelius Herman - LIMA to LAD, SVG to circumflex  . TOTAL KNEE ARTHROPLASTY    . TOTAL KNEE ARTHROPLASTY Left 05/20/2014   DR Erin Herman  . TOTAL KNEE ARTHROPLASTY Left 05/20/2014   Procedure: LEFT TOTAL KNEE ARTHROPLASTY;  Surgeon: Erin Simmerobert A Wainer, MD;  Location: [facility] OR;  Service: Orthopedics;   Laterality: Left;    Current Outpatient Prescriptions  Medication Sig Dispense Refill  . benazepril (LOTENSIN) 10 MG tablet Take 1 tablet (10 mg total) by mouth as directed. 90 tablet 3  . Cholecalciferol (VITAMIN D3) 5000 UNITS TABS Take 5,000 Units by mouth daily.    . fluticasone (FLONASE) 50 MCG/ACT nasal spray Place 2 sprays into both nostrils at bedtime.   0  . furosemide (LASIX) 40 MG tablet Take 1 tablet (40 mg total) by mouth 2 (two) times daily. 180 tablet 3  . ibuprofen (ADVIL,MOTRIN) 200 MG tablet Take 200-400 mg by mouth every 4 (four) hours as needed (pain).    Erin Herman. levothyroxine (SYNTHROID, LEVOTHROID) 100 MCG tablet Take 100 mcg by mouth daily before breakfast.   2  . metFORMIN (GLUCOPHAGE) 500 MG tablet Take 500 mg by mouth daily with breakfast.      . metoprolol succinate (TOPROL-XL) 50 MG 24 hr tablet Take 1 tablet (50 mg total) by mouth daily. 90 tablet 3  . Multiple Vitamins-Minerals (HAIR/SKIN/NAILS/BIOTIN) TABS Take 1 tablet by mouth daily.    Erin Herman. Polyethyl Glycol-Propyl Glycol (SYSTANE OP) Place 1 drop into both eyes 4 (four) times daily as needed (burning eyes).    . potassium chloride SA (K-DUR,KLOR-CON) 20 MEQ tablet Take 1 tablet (20 mEq total) by mouth daily after supper. 90  tablet 3  . Potassium Phosphate Dibasic GRAN Take 1 tablet by mouth 2 (two) times daily.    . sertraline (ZOLOFT) 100 MG tablet Take 50 mg by mouth at bedtime.     Erin Herman spironolactone (ALDACTONE) 25 MG tablet Take 0.5 tablets (12.5 mg total) by mouth daily. 45 tablet 3  . vitamin C (ASCORBIC ACID) 500 MG tablet Take 500 mg by mouth 2 (two) times daily.    Erin Herman VITAMIN E PO Take 1 tablet by mouth daily.     No current facility-administered medications for this visit.    Allergies:  Morphine and Penicillins   Social History: The patient  reports that she has never smoked. She has never used smokeless tobacco. She reports that she does not drink alcohol or use drugs.   ROS:  Please see the history of  present illness. Otherwise, complete review of systems is positive for none.  All other systems are reviewed and negative.   Physical Exam: VS:  BP (!) 120/58   Pulse 69   Ht 5\' 4"  (1.626 m)   Wt 145 lb (65.8 kg)   BMI 24.89 kg/m , BMI Body mass index is 24.89 kg/m.  Wt Readings from Last 3 Encounters:  12/19/15 145 lb (65.8 kg)  11/21/15 144 lb 3.2 oz (65.4 kg)  10/01/15 148 lb (67.1 kg)    Pleasant elderly woman, no distress. HEENT: Conjunctiva and lids normal, oropharynx clear. Neck: Supple, no elevated JVP or carotid bruits, no thyromegaly. Lungs: Clear to auscultation, nonlabored breathing at rest. Cardiac: Regular rate and rhythm with occasional ectopic beat, no S3, 2/6 basal systolic murmur, no pericardial rub. Abdomen: Nontender, bowel sounds present, no guarding or rebound. Extremities: No pitting ankle edema, distal pulses 2+.  ECG: I personally reviewed the tracing from 11/13/2015 which showed sinus rhythm with PVCs, IVCD.   Recent Labwork:  July 2017: BUN 13, creatinine 0.7, potassium 3.4, AST 37, ALT 17, troponin T negative, hemoglobin 11.2, platelets 171, urine culture positive for Enterobacter cloacae  Other Studies Reviewed Today:  Lexiscan Cardiolite 04/23/2014: FINDINGS: Stress data The patient underwent stress testing with Lexiscan Baseline EKG SR LBBB With infusion of lexiscan the EKG showed no change but was nondiagnostic due to baseline LBBB  Nuclear data: IN the initial stress images there was decreased tracer activity in the anteroseptal wall (mid, distal), There was a defect in the inferoseptal wall (base, mid) and basal septal wall.  IN the recovery images there was no significant change. Review of the raw data soft tissue (breast, diaphragm) surrounds the heart.  IMPRESSION: Lexiscan Cardiolite; Electrically xenodiagnostic for ischemia. Cardiolite scan with moderate area of fixed defect in the anteroseptal, inferoseptal and basal septal  wall (see above) consistent with scar and possible soft tissue attenuation . No ischemia. LVEF on gating calculated at 67% with septal hypokinesis.  Echocardiogram 12/11/2015: Study Conclusions  - Left ventricle: The cavity size was normal. Wall thickness was   increased in a pattern of mild LVH. Systolic function was normal.   The estimated ejection fraction was in the range of 60% to 65%.   Wall motion was normal; there were no regional wall motion   abnormalities. Features are consistent with a pseudonormal left   ventricular filling pattern, with concomitant abnormal relaxation   and increased filling pressure (grade 2 diastolic dysfunction). - Ventricular septum: The contour showed diastolic flattening. - Aortic valve: 23 mm [place] stentless porcine valve in aortic   position. Mean gradient (S): 14 mm Hg. Peak  gradient (S): 24 mm   Hg. VTI ratio of LVOT to aortic valve: 0.59. - Mitral valve: Calcified annulus. Mildly thickened leaflets .   There was moderate regurgitation. - Left atrium: The atrium was mildly dilated. - Right ventricle: The cavity size was mildly dilated. Systolic   function was mildly reduced. - Right atrium: The atrium was mildly dilated. The Eustachian valve   appeared prominant. Central venous pressure (est): 3 mm Hg. - Tricuspid valve: There was mild-moderate regurgitation. - Pulmonary arteries: PA peak pressure: 46 mm Hg (S). - Pericardium, extracardiac: There was no pericardial effusion.  Impressions:  - Mild LVH with LVEF 60-65%. Grade 2 diastolic dysfunction with   increased LV filling pressure. Diastolic septal flattening   suggesting increased RV volume. Mild left atrial enlargement.   Moderate MAC with thickened mitral leaflets and moderate mitral   regurgitation. Grossly normal bioprosthetic AVR function as   outlined above with mean gradient 14 mmHg and no significant   perivalvular regurgitation. Mild RV enlargement with mildly   reduced  contraction. Mild to moderate tricuspid regurgitation   with PASP 46 mmHg. Mild right atrial enlargement, prominent   Eustachian valve noted.  Assessment and Plan:  1. Improved weakness and fatigue, blood pressure is also better. Would continue Lotensin at 5 mg daily.  2. Diastolic dysfunction, overall moderate with associated RV dysfunction and mild to moderate pulmonary hypertension. We will keep her on diuretics, increase Lasix to 40 mg the morning and 20 mg in the evening. Her leg edema has improved.  3. Aortic stenosis status post bioprosthetic AVR 2006, grossly normal function by recent follow-up echocardiogram with mean gradient 14 mmHg.  Current medicines were reviewed with the patient today.  Disposition: Follow-up with me in 3 months.  Signed, Jonelle Sidle, MD, [facility] 12/19/2015 2:33 PM    [facility] at [facility] [address] [place], [place], [place] [address] Phone: [phone]; Fax: [phone]

## 2015-12-19 NOTE — Patient Instructions (Signed)
Your physician recommends that you schedule a follow-up appointment in: 3 MONTHS [facility]  Your physician recommends that you continue on your current medications as directed. Please refer to the Current Medication list given to you today.  WE HAVE UPDATED YOU MEDICATION LIST  Thank you for choosing [facility]!!

## 2016-04-05 NOTE — Progress Notes (Signed)
Cardiology Office Note  Date: 04/06/2016   ID: Erin Herman, DOB 29-Dec-1927, MRN [id number]  PCP: Kirstie PeriSHAH,ASHISH, MD  Primary Cardiologist: Nona DellSamuel Avrianna Smart, MD   Chief Complaint  Patient presents with  . Coronary Artery Disease  . History of AVR    History of Present Illness: Erin Herman is an 82 y.o. female last seen in August. She presents for a routine follow-up visit. Reports no palpitations or chest pain on current medical regimen. She has been having some trouble with back and knee pain, using a cane.  I reviewed her cardiac regimen which currently includes Aldactone, KCl, Toprol-XL, Lotensin and Lasix. Blood pressure is low normal today and she is asymptomatic. She does have some mild ankle edema at times.  Echocardiogram from earlier this year is outlined below showing stable AVR function.  Past Medical History:  Diagnosis Date  . Anemia   . Aortic stenosis    Bioprosthetic AVR 2006  . Arthritis   . CAD (coronary artery disease), native coronary artery    Multivessel status post CABG 2006  . Cardiomyopathy (HCC)   . Depression   . Essential hypertension   . History of hiatal hernia   . Hx of migraines   . Hyperthyroidism   . LBBB (left bundle branch block)   . Mixed hyperlipidemia   . Postoperative nausea and vomiting    Morphine  . Type 2 diabetes mellitus (HCC)   . UTI (lower urinary tract infection)     Past Surgical History:  Procedure Laterality Date  . AORTIC VALVE REPLACEMENT  2006   Dr. Cornelius Moraswen - 23 mm [place] stentless porcine valve  . CORONARY ARTERY BYPASS GRAFT  2006    Dr. Cornelius Moraswen - LIMA to LAD, SVG to circumflex  . TOTAL KNEE ARTHROPLASTY    . TOTAL KNEE ARTHROPLASTY Left 05/20/2014   DR Thurston HoleWAINER  . TOTAL KNEE ARTHROPLASTY Left 05/20/2014   Procedure: LEFT TOTAL KNEE ARTHROPLASTY;  Surgeon: Nilda Simmerobert A Wainer, MD;  Location: [facility] OR;  Service: Orthopedics;  Laterality: Left;    Current Outpatient Prescriptions  Medication Sig Dispense  Refill  . benazepril (LOTENSIN) 5 MG tablet Take 5 mg by mouth daily.    . Cholecalciferol (VITAMIN D3) 5000 UNITS TABS Take 5,000 Units by mouth daily.    . fluticasone (FLONASE) 50 MCG/ACT nasal spray Place 2 sprays into both nostrils at bedtime.   0  . furosemide (LASIX) 40 MG tablet Take 40 mg by mouth. TAKE 40 MG IN MORNING AND 20 MG IN EVENING    . ibuprofen (ADVIL,MOTRIN) 200 MG tablet Take 200-400 mg by mouth every 4 (four) hours as needed (pain).    Marland Kitchen. levothyroxine (SYNTHROID, LEVOTHROID) 100 MCG tablet Take 100 mcg by mouth daily before breakfast.   2  . metFORMIN (GLUCOPHAGE) 500 MG tablet Take 500 mg by mouth daily with breakfast.      . metoprolol succinate (TOPROL-XL) 50 MG 24 hr tablet Take 1 tablet (50 mg total) by mouth daily. 90 tablet 3  . Multiple Vitamins-Minerals (HAIR/SKIN/NAILS/BIOTIN) TABS Take 1 tablet by mouth daily.    Bertram Gala. Polyethyl Glycol-Propyl Glycol (SYSTANE OP) Place 1 drop into both eyes 4 (four) times daily as needed (burning eyes).    . potassium chloride SA (K-DUR,KLOR-CON) 20 MEQ tablet Take 1 tablet (20 mEq total) by mouth daily after supper. 90 tablet 3  . Potassium Phosphate Dibasic GRAN Take 1 tablet by mouth 2 (two) times daily.    . sertraline (ZOLOFT) 100 MG tablet  Take 50 mg by mouth at bedtime.     Marland Kitchen. spironolactone (ALDACTONE) 25 MG tablet Take 0.5 tablets (12.5 mg total) by mouth daily. 45 tablet 3  . vitamin C (ASCORBIC ACID) 500 MG tablet Take 500 mg by mouth 2 (two) times daily.    Marland Kitchen. VITAMIN E PO Take 1 tablet by mouth daily.     No current facility-administered medications for this visit.    Allergies:  Morphine and Penicillins   Social History: The patient  reports that she has never smoked. She has never used smokeless tobacco. She reports that she does not drink alcohol or use drugs.   ROS:  Please see the history of present illness. Otherwise, complete review of systems is positive for arthritic pains and stiffness.  All other systems are  reviewed and negative.   Physical Exam: VS:  BP (!) 102/58   Pulse 82   Ht 5\' 4"  (1.626 m)   Wt 141 lb (64 kg)   SpO2 98%   BMI 24.20 kg/m , BMI Body mass index is 24.2 kg/m.  Wt Readings from Last 3 Encounters:  04/06/16 141 lb (64 kg)  12/19/15 145 lb (65.8 kg)  11/21/15 144 lb 3.2 oz (65.4 kg)    Pleasant elderly woman, no distress. HEENT: Conjunctiva and lids normal, oropharynx clear. Neck: Supple, no elevated JVP or carotid bruits, no thyromegaly. Lungs: Clear to auscultation, nonlabored breathing at rest. Cardiac: Regular rate and rhythmwith occasional ectopic beat, no S3, 2/6 basal systolic murmur, no pericardial rub. Abdomen: Nontender, bowel sounds present, no guarding or rebound. Extremities: No pitting ankle edema,distal pulses 2+. Skin: Warm and dry. Musculoskeletal: No kyphosis. Neuropsychiatric: Alert and oriented 3, affect appropriate.  ECG: I personally reviewed the tracing from 11/13/2015 which showed sinus rhythm with PVCs, IVCD.  Recent Labwork:  July 2017: BUN 13, creatinine 0.7, potassium 3.4, AST 37, ALT 17, troponin T negative, hemoglobin 11.2, platelets 171, urine culture positive for Enterobacter cloacae  Other Studies Reviewed Today:  Lexiscan Cardiolite 04/23/2014: FINDINGS: Stress data The patient underwent stress testing with Lexiscan Baseline EKG SR LBBB With infusion of lexiscan the EKG showed no change but was nondiagnostic due to baseline LBBB  Nuclear data: IN the initial stress images there was decreased tracer activity in the anteroseptal wall (mid, distal), There was a defect in the inferoseptal wall (base, mid) and basal septal wall.  IN the recovery images there was no significant change. Review of the raw data soft tissue (breast, diaphragm) surrounds the heart.  IMPRESSION: Lexiscan Cardiolite; Electrically xenodiagnostic for ischemia. Cardiolite scan with moderate area of fixed defect in the anteroseptal, inferoseptal  and basal septal wall (see above) consistent with scar and possible soft tissue attenuation . No ischemia. LVEF on gating calculated at 67% with septal hypokinesis.  Echocardiogram 12/11/2015: Study Conclusions  - Left ventricle: The cavity size was normal. Wall thickness was increased in a pattern of mild LVH. Systolic function was normal. The estimated ejection fraction was in the range of 60% to 65%. Wall motion was normal; there were no regional wall motion abnormalities. Features are consistent with a pseudonormal left ventricular filling pattern, with concomitant abnormal relaxation and increased filling pressure (grade 2 diastolic dysfunction). - Ventricular septum: The contour showed diastolic flattening. - Aortic valve: 23 mm [place] stentless porcine valve in aortic position. Mean gradient (S): 14 mm Hg. Peak gradient (S): 24 mm Hg. VTI ratio of LVOT to aortic valve: 0.59. - Mitral valve: Calcified annulus. Mildly thickened leaflets .  There was moderate regurgitation. - Left atrium: The atrium was mildly dilated. - Right ventricle: The cavity size was mildly dilated. Systolic function was mildly reduced. - Right atrium: The atrium was mildly dilated. The Eustachian valve appeared prominant. Central venous pressure (est): 3 mm Hg. - Tricuspid valve: There was mild-moderate regurgitation. - Pulmonary arteries: PA peak pressure: 46 mm Hg (S). - Pericardium, extracardiac: There was no pericardial effusion.  Impressions:  - Mild LVH with LVEF 60-65%. Grade 2 diastolic dysfunction with increased LV filling pressure. Diastolic septal flattening suggesting increased RV volume. Mild left atrial enlargement. Moderate MAC with thickened mitral leaflets and moderate mitral regurgitation. Grossly normal bioprosthetic AVR function as outlined above with mean gradient 14 mmHg and no significant perivalvular regurgitation. Mild RV enlargement with  mildly reduced contraction. Mild to moderate tricuspid regurgitation with PASP 46 mmHg. Mild right atrial enlargement, prominent Eustachian valve noted.  Assessment and Plan:  1. Multivessel CAD status post CABG in 2006. She does not report any angina symptoms and had ischemic testing in 2015 as outlined above. We will plan to continue medical therapy and observation.  2. History over stenosis status post bioprosthetic AVR, function stable by echocardiogram earlier this year.  3. Essential hypertension, low-normal blood pressure today. A symptom medical current regimen.  4. History of diastolic dysfunction with RV dysfunction and mild-to-moderate pulmonary hypertension. We continue on diuretics. Leg edema has been relatively stable.  Current medicines were reviewed with the patient today.  Disposition: Follow-up in 6 months.  Signed, Jonelle Sidle, MD, [facility] 04/06/2016 12:07 PM    [facility] at [facility] [address] [place], [place], [place] [address] Phone: [phone]; Fax: [phone]

## 2016-04-06 ENCOUNTER — Ambulatory Visit (INDEPENDENT_AMBULATORY_CARE_PROVIDER_SITE_OTHER): Payer: Medicare Other | Admitting: Cardiology

## 2016-04-06 ENCOUNTER — Encounter: Payer: Self-pay | Admitting: Cardiology

## 2016-04-06 VITALS — BP 102/58 | HR 82 | Ht 64.0 in | Wt 141.0 lb

## 2016-04-06 DIAGNOSIS — I5032 Chronic diastolic (congestive) heart failure: Secondary | ICD-10-CM | POA: Diagnosis not present

## 2016-04-06 DIAGNOSIS — I2510 Atherosclerotic heart disease of native coronary artery without angina pectoris: Secondary | ICD-10-CM | POA: Diagnosis not present

## 2016-04-06 DIAGNOSIS — Z953 Presence of xenogenic heart valve: Secondary | ICD-10-CM | POA: Diagnosis not present

## 2016-04-06 DIAGNOSIS — I10 Essential (primary) hypertension: Secondary | ICD-10-CM

## 2016-04-06 NOTE — Patient Instructions (Signed)

## 2016-04-16 ENCOUNTER — Telehealth: Payer: Self-pay | Admitting: Cardiology

## 2016-04-16 MED ORDER — FUROSEMIDE 40 MG PO TABS: ORAL_TABLET | ORAL | 3 refills | Status: DC

## 2016-04-16 NOTE — Telephone Encounter (Signed)
Would have her take Lasix 80 mg in the morning and 40 mg in the evening for the next 2-3 days until weight has gone back down. She should be taking her potassium supplements during this time as well.

## 2016-04-16 NOTE — Telephone Encounter (Signed)
Pt says she has gained 2-3lbs in the last 3 weeks - swelling in knees, stomach and feet/ankles. Denies SOB but says she hasn't been feeling well. Says she is taking lasix 40 mg bid. Routed to provider

## 2016-04-16 NOTE — Telephone Encounter (Signed)
Has her weight come down at all? How long has she taken Lasix at 40 mg BID?

## 2016-04-16 NOTE — Telephone Encounter (Signed)
Pt aware and voiced understanding of plan and to take potassium - requested refills of lasix

## 2016-04-16 NOTE — Telephone Encounter (Signed)
Patient called stating that she is retaining fluid in her feet and knees.  States that this has been going on for approximately 3 weeks. She wants to know if she can increase her fluid pills.

## 2016-04-16 NOTE — Telephone Encounter (Signed)
Pt says she  Didn't know how long she has has been taking lasix 40 mg bid but knows its been a long time - weight hasn't gone down says only has gone up 2-3lbs in last 3 weeks.

## 2016-04-20 ENCOUNTER — Telehealth: Payer: Self-pay | Admitting: *Deleted

## 2016-04-20 MED ORDER — METOLAZONE 5 MG PO TABS: 5.0000 mg | ORAL_TABLET | Freq: Every day | ORAL | 0 refills | Status: DC | PRN

## 2016-04-20 NOTE — Telephone Encounter (Signed)
Patient notified

## 2016-04-20 NOTE — Telephone Encounter (Signed)
Did the extra Lasix on 12/22 thru 04/20/2016.  States swelling is still there, can not tell that it is any different.  Weight has gone up a little more, thinks may be 3 lbs since initial call on 04/16/2016.  No SOB, no chest pain, no dizziness.  States has swelling in feet, ankles, legs, & abdomen.

## 2016-04-20 NOTE — Telephone Encounter (Signed)
Then would continue Lasix 80 mg in the morning and 40 mg in the evening for a few more days, and add Zaroxolyn 5 mg daily during this time as well.

## 2016-06-15 ENCOUNTER — Other Ambulatory Visit: Payer: Self-pay | Admitting: *Deleted

## 2016-06-15 DIAGNOSIS — I5032 Chronic diastolic (congestive) heart failure: Secondary | ICD-10-CM

## 2016-06-15 MED ORDER — METOPROLOL SUCCINATE ER 50 MG PO TB24: 50.0000 mg | ORAL_TABLET | Freq: Every day | ORAL | 1 refills | Status: DC

## 2016-07-06 ENCOUNTER — Other Ambulatory Visit: Payer: Self-pay

## 2016-07-06 DIAGNOSIS — I5032 Chronic diastolic (congestive) heart failure: Secondary | ICD-10-CM

## 2016-07-06 MED ORDER — SPIRONOLACTONE 25 MG PO TABS: 12.5000 mg | ORAL_TABLET | Freq: Every day | ORAL | 3 refills | Status: AC

## 2016-08-16 ENCOUNTER — Other Ambulatory Visit: Payer: Self-pay | Admitting: *Deleted

## 2016-08-16 DIAGNOSIS — I5032 Chronic diastolic (congestive) heart failure: Secondary | ICD-10-CM

## 2016-08-16 MED ORDER — POTASSIUM CHLORIDE CRYS ER 20 MEQ PO TBCR: 20.0000 meq | EXTENDED_RELEASE_TABLET | Freq: Every day | ORAL | 1 refills | Status: DC

## 2016-10-03 NOTE — Progress Notes (Signed)
Cardiology Office Note  Date: 10/04/2016   ID: Erin Herman, DOB 29-Mar-1928, MRN [id number]  PCP: Monico Blitz, MD  Primary Cardiologist: Rozann Lesches, MD   Chief Complaint  Patient presents with  . History of AVR  . Coronary Artery Disease    History of Present Illness: Erin Herman is an 83 y.o. female last seen in December 2017. She presents for a routine follow-up visit. Complains of difficulty ambulating with chronic knee pain also knee swelling. She has lumbar disc disease and felt to be a poor surgical candidate by Dr. Carloyn Manner. She has been using a cane. No recent falls.  Since last visit we did temporarily increase Lasix and add Zaroxolyn due to weight gain. Weight has been relatively stable. I reviewed her current regimen. She continues on Lasix 40 mg twice daily and Aldactone 12.5 mg daily. She had recent lab work done as outlined below. Creatinine was 1.2 with normal potassium.  Last stress test was in 2015 as noted below. I personally reviewed her ECG today which shows sinus rhythm with rightward axis and IVCD of left bundle branch block type.  Past Medical History:  Diagnosis Date  . Anemia   . Aortic stenosis    Bioprosthetic AVR 2006  . Arthritis   . CAD (coronary artery disease), native coronary artery    Multivessel status post CABG 2006  . Cardiomyopathy ([place])   . Depression   . Essential hypertension   . History of hiatal hernia   . Hx of migraines   . Hyperthyroidism   . LBBB (left bundle branch block)   . Mixed hyperlipidemia   . Postoperative nausea and vomiting    Morphine  . Type 2 diabetes mellitus ([place])   . UTI (lower urinary tract infection)     Past Surgical History:  Procedure Laterality Date  . AORTIC VALVE REPLACEMENT  2006   Dr. Roxy Manns - 23 mm [place] stentless porcine valve  . CORONARY ARTERY BYPASS GRAFT  2006    Dr. Roxy Manns - LIMA to LAD, SVG to circumflex  . TOTAL KNEE ARTHROPLASTY    . TOTAL KNEE ARTHROPLASTY Left  05/20/2014   DR Noemi Chapel  . TOTAL KNEE ARTHROPLASTY Left 05/20/2014   Procedure: LEFT TOTAL KNEE ARTHROPLASTY;  Surgeon: Lorn Junes, MD;  Location: [facility];  Service: Orthopedics;  Laterality: Left;    Current Outpatient Prescriptions  Medication Sig Dispense Refill  . benazepril (LOTENSIN) 5 MG tablet Take 5 mg by mouth daily.    . Cholecalciferol (VITAMIN D3) 5000 UNITS TABS Take 5,000 Units by mouth daily.    . furosemide (LASIX) 40 MG tablet Take 40 mg by mouth 2 (two) times daily.    Marland Kitchen ibuprofen (ADVIL,MOTRIN) 200 MG tablet Take 200-400 mg by mouth every 4 (four) hours as needed (pain).    Marland Kitchen levothyroxine (SYNTHROID, LEVOTHROID) 100 MCG tablet Take 100 mcg by mouth daily before breakfast.   2  . metFORMIN (GLUCOPHAGE) 500 MG tablet Take 500 mg by mouth daily with breakfast.      . metoprolol succinate (TOPROL-XL) 50 MG 24 hr tablet Take 1 tablet (50 mg total) by mouth daily. 90 tablet 1  . Multiple Vitamins-Minerals (HAIR/SKIN/NAILS/BIOTIN) TABS Take 1 tablet by mouth daily.    Vladimir Faster Glycol-Propyl Glycol (SYSTANE OP) Place 1 drop into both eyes 4 (four) times daily as needed (burning eyes).    . potassium chloride SA (K-DUR,KLOR-CON) 20 MEQ tablet Take 1 tablet (20 mEq total) by mouth daily after supper.  90 tablet 1  . sertraline (ZOLOFT) 100 MG tablet Take 50 mg by mouth at bedtime.     Marland Kitchen spironolactone (ALDACTONE) 25 MG tablet Take 0.5 tablets (12.5 mg total) by mouth daily. 45 tablet 3  . vitamin C (ASCORBIC ACID) 500 MG tablet Take 500 mg by mouth 2 (two) times daily.    Marland Kitchen VITAMIN E PO Take 1 tablet by mouth daily.     No current facility-administered medications for this visit.    Allergies:  Morphine and Penicillins   Social History: The patient  reports that she has never smoked. She has never used smokeless tobacco. She reports that she does not drink alcohol or use drugs.   ROS:  Please see the history of present illness. Otherwise, complete review of systems is  positive for chronic arthritic back and knee pain.  All other systems are reviewed and negative.   Physical Exam: VS:  BP 102/62   Pulse 98   Ht _0  (1.626 m)   Wt 143 lb (64.9 kg)   LMP  (LMP Unknown)   BMI 24.55 kg/m , BMI Body mass index is 24.55 kg/m.  Wt Readings from Last 3 Encounters:  10/04/16 143 lb (64.9 kg)  04/06/16 141 lb (64 kg)  12/19/15 145 lb (65.8 kg)    Pleasant elderly woman, no distress. HEENT: Conjunctiva and lids normal, oropharynx clear. Neck: Supple, no elevated JVP or carotid bruits, no thyromegaly. Lungs: Clear to auscultation, nonlabored breathing at rest. Cardiac: Regular rate and rhythmwith occasional ectopic beat, no S3, 2/6 basal systolic murmur, no pericardial rub. Abdomen: Nontender, bowel sounds present, no guarding or rebound. Extremities: No pittingankle edema,knee swelling bilaterally, distal pulses 2+. Skin: Warm and dry. Musculoskeletal: No kyphosis. Neuropsychiatric: Alert and oriented 3, affect appropriate.  ECG: I personally reviewed the tracing from 11/13/2015 which showed sinus rhythm with PVCs, IVCD.  Recent Labwork:  June 2018: BUN 39, creatinine 1.2, potassium 4.4, AST 21, ALT 13, T4 1 0.93, TSH 2.03, cholesterol 239, triglycerides 138, HDL 46, LDL 165, hemoglobin 11.6, platelets 324  Other Studies Reviewed Today:  Lexiscan Cardiolite 04/23/2014: FINDINGS: Stress data The patient underwent stress testing with Lexiscan Baseline EKG SR LBBB With infusion of lexiscan the EKG showed no change but was nondiagnostic due to baseline LBBB  Nuclear data: IN the initial stress images there was decreased tracer activity in the anteroseptal wall (mid, distal), There was a defect in the inferoseptal wall (base, mid) and basal septal wall.  IN the recovery images there was no significant change. Review of the raw data soft tissue (breast, diaphragm) surrounds the heart.  IMPRESSION: Lexiscan Cardiolite; Electrically  nondiagnostic for ischemia. Cardiolite scan with moderate area of fixed defect in the anteroseptal, inferoseptal and basal septal wall (see above) consistent with scar and possible soft tissue attenuation . No ischemia. LVEF on gating calculated at 67% with septal hypokinesis.  Echocardiogram8/17/2017: Study Conclusions  - Left ventricle: The cavity size was normal. Wall thickness was increased in a pattern of mild LVH. Systolic function was normal. The estimated ejection fraction was in the range of 60% to 65%. Wall motion was normal; there were no regional wall motion abnormalities. Features are consistent with a pseudonormal left ventricular filling pattern, with concomitant abnormal relaxation and increased filling pressure (grade 2 diastolic dysfunction). - Ventricular septum: The contour showed diastolic flattening. - Aortic valve: 23 mm [place] stentless porcine valve in aortic position. Mean gradient (S): 14 mm Hg. Peak gradient (S): 24 mm Hg. VTI  ratio of LVOT to aortic valve: 0.59. - Mitral valve: Calcified annulus. Mildly thickened leaflets . There was moderate regurgitation. - Left atrium: The atrium was mildly dilated. - Right ventricle: The cavity size was mildly dilated. Systolic function was mildly reduced. - Right atrium: The atrium was mildly dilated. The Eustachian valve appeared prominant. Central venous pressure (est): 3 mm Hg. - Tricuspid valve: There was mild-moderate regurgitation. - Pulmonary arteries: PA peak pressure: 46 mm Hg (S). - Pericardium, extracardiac: There was no pericardial effusion.  Impressions:  - Mild LVH with LVEF 60-65%. Grade 2 diastolic dysfunction with increased LV filling pressure. Diastolic septal flattening suggesting increased RV volume. Mild left atrial enlargement. Moderate MAC with thickened mitral leaflets and moderate mitral regurgitation. Grossly normal bioprosthetic AVR function  as outlined above with mean gradient 14 mmHg and no significant perivalvular regurgitation. Mild RV enlargement with mildly reduced contraction. Mild to moderate tricuspid regurgitation with PASP 46 mmHg. Mild right atrial enlargement, prominent Eustachian valve noted.  Assessment and Plan:  1. Chronic diastolic heart failure and RV dysfunction. Plan is to continue current diuretic regimen. Knee swelling does not look to be related, her lower legs and ankles have no edema today. Her weight is also stable.  2. Aortic stenosis status post bioprosthetic AVR. Echocardiogram from last year showed stable prosthetic function with mean gradient 14 mmHg. No change on examination today.  3. Essential hypertension, blood pressure low normal today. No changes made to present regimen. She has had no dizziness or syncope.  4. Multivessel CAD status post CABG in 2006. Ischemic testing from 2015 is outlined above. She reports no angina we will continue with observation for now.  Current medicines were reviewed with the patient today.  Disposition: Follow-up in 6 months.  Signed, Satira Sark, MD, [facility] 10/04/2016 11:14 AM    [facility] at [place], [place], [place] [address] Phone: [phone]; Fax: [phone]

## 2016-10-04 ENCOUNTER — Encounter: Payer: Self-pay | Admitting: Cardiology

## 2016-10-04 ENCOUNTER — Ambulatory Visit (INDEPENDENT_AMBULATORY_CARE_PROVIDER_SITE_OTHER): Payer: Medicare Other | Admitting: Cardiology

## 2016-10-04 VITALS — BP 102/62 | HR 98 | Ht 64.0 in | Wt 143.0 lb

## 2016-10-04 DIAGNOSIS — I10 Essential (primary) hypertension: Secondary | ICD-10-CM | POA: Diagnosis not present

## 2016-10-04 DIAGNOSIS — Z953 Presence of xenogenic heart valve: Secondary | ICD-10-CM

## 2016-10-04 DIAGNOSIS — I2510 Atherosclerotic heart disease of native coronary artery without angina pectoris: Secondary | ICD-10-CM | POA: Diagnosis not present

## 2016-10-04 DIAGNOSIS — I5032 Chronic diastolic (congestive) heart failure: Secondary | ICD-10-CM | POA: Diagnosis not present

## 2016-10-04 NOTE — Patient Instructions (Signed)

## 2017-01-26 ENCOUNTER — Other Ambulatory Visit: Payer: Self-pay | Admitting: *Deleted

## 2017-01-26 DIAGNOSIS — I5032 Chronic diastolic (congestive) heart failure: Secondary | ICD-10-CM

## 2017-01-26 MED ORDER — POTASSIUM CHLORIDE CRYS ER 20 MEQ PO TBCR: 20.0000 meq | EXTENDED_RELEASE_TABLET | Freq: Every day | ORAL | 0 refills | Status: AC

## 2017-02-02 ENCOUNTER — Telehealth: Payer: Self-pay | Admitting: Cardiology

## 2017-02-02 NOTE — Telephone Encounter (Signed)
[name] (daughter) called in regards to Mrs. Nudelman needing cardiac clearance for a Hip replacement on 03-01-17. I called [facility] office requesting that they send information to our office in regards to this clearance.  Left message on voice mail of Cordelia Pen.  [phone].

## 2017-02-02 NOTE — Telephone Encounter (Signed)
Patient notified that we did receive clearance form.  Form has been placed in Dr. Ival Bible box as he will be back in this office tomorrow.  Will let patient know when completed.

## 2017-02-03 NOTE — Telephone Encounter (Signed)
Patient notified form has been faxed to [facility].

## 2017-02-16 ENCOUNTER — Other Ambulatory Visit (HOSPITAL_COMMUNITY): Payer: Self-pay | Admitting: Neurosurgery

## 2017-02-16 DIAGNOSIS — E2839 Other primary ovarian failure: Secondary | ICD-10-CM

## 2017-02-18 ENCOUNTER — Ambulatory Visit (HOSPITAL_COMMUNITY)
Admission: RE | Admit: 2017-02-18 | Discharge: 2017-02-18 | Disposition: A | Discharge status: Home / Self Care | Payer: Medicare Other | Source: Ambulatory Visit | Attending: Neurosurgery | Admitting: Neurosurgery

## 2017-02-18 DIAGNOSIS — E2839 Other primary ovarian failure: Secondary | ICD-10-CM | POA: Diagnosis present

## 2017-02-18 DIAGNOSIS — M810 Age-related osteoporosis without current pathological fracture: Secondary | ICD-10-CM | POA: Insufficient documentation

## 2017-02-18 NOTE — H&P (Signed)
TOTAL HIP ADMISSION H&P  Patient is admitted for right total hip arthroplasty, anterior approach.  Subjective:  Chief Complaint:    Right hip primary OA / pain  HPI: Erin Herman, 83 y.o. female, has a history of pain and functional disability in the right hip(s) due to arthritis and patient has failed non-surgical conservative treatments for greater than 12 weeks to include NSAID's and/or analgesics, corticosteriod injections, use of assistive devices and activity modification.  Onset of symptoms was gradual starting ~2 years ago with gradually worsening course since that time.The patient noted no past surgery on the right hip(s).  Patient currently rates pain in the right hip at 8 out of 10 with activity. Patient has night pain, worsening of pain with activity and weight bearing, trendelenberg gait, pain that interfers with activities of daily living and pain with passive range of motion. Patient has evidence of periarticular osteophytes and joint space narrowing by imaging studies. This condition presents safety issues increasing the risk of falls.  There is no current active infection.  Risks, benefits and expectations were discussed with the patient.  Risks including but not limited to the risk of anesthesia, blood clots, nerve damage, blood vessel damage, failure of the prosthesis, infection and up to and including death.  Patient understand the risks, benefits and expectations and wishes to proceed with surgery.   PCP: Kirstie Peri, MD  D/C Plans:       Home   Post-op Meds:       No Rx given  Tranexamic Acid:      To be given - IV   Decadron:      Is to be given  FYI:     ASA  Norco  Fentyl  DME:   Pt already has equipment  PT:   No PT    Patient Active Problem List   Diagnosis Date Noted  . Postoperative anemia due to acute blood loss 05/22/2014  . DJD (degenerative joint disease) of knee 05/20/2014  . Primary localized osteoarthritis of left knee 04/30/2014  .  Preoperative cardiovascular examination 04/04/2014  . Chronic diastolic heart failure (HCC) 07/03/2012  . CAROTID ARTERY DISEASE 12/09/2009  . Essential hypertension, benign 08/05/2008  . CAD, NATIVE VESSEL 08/05/2008  . Secondary cardiomyopathy (HCC) 08/05/2008  . Aortic valve disorder 08/05/2008  . Hyperlipidemia 08/03/2008   Past Medical History:  Diagnosis Date  . Anemia   . Aortic stenosis    Bioprosthetic AVR 2006  . Arthritis   . CAD (coronary artery disease), native coronary artery    Multivessel status post CABG 2006  . Cardiomyopathy (HCC)   . Depression   . Essential hypertension   . History of hiatal hernia   . Hx of migraines   . Hyperthyroidism   . LBBB (left bundle branch block)   . Mixed hyperlipidemia   . Postoperative nausea and vomiting    Morphine  . Type 2 diabetes mellitus (HCC)   . UTI (lower urinary tract infection)     Past Surgical History:  Procedure Laterality Date  . AORTIC VALVE REPLACEMENT  2006   Dr. Cornelius Moras - 23 mm [place] stentless porcine valve  . CORONARY ARTERY BYPASS GRAFT  2006    Dr. Cornelius Moras - LIMA to LAD, SVG to circumflex  . TOTAL KNEE ARTHROPLASTY    . TOTAL KNEE ARTHROPLASTY Left 05/20/2014   DR Thurston Hole  . TOTAL KNEE ARTHROPLASTY Left 05/20/2014   Procedure: LEFT TOTAL KNEE ARTHROPLASTY;  Surgeon: Nilda Simmer, MD;  Location:  [facility] OR;  Service: Orthopedics;  Laterality: Left;    No current facility-administered medications for this encounter.    Current Outpatient Prescriptions  Medication Sig Dispense Refill Last Dose  . benazepril (LOTENSIN) 5 MG tablet Take 5 mg by mouth daily.   Taking  . Cholecalciferol (VITAMIN D3) 5000 UNITS TABS Take 5,000 Units by mouth daily.   Taking  . furosemide (LASIX) 40 MG tablet Take 40 mg by mouth 2 (two) times daily.   Taking  . ibuprofen (ADVIL,MOTRIN) 200 MG tablet Take 200-400 mg by mouth every 4 (four) hours as needed (pain).   Taking  . levothyroxine (SYNTHROID, LEVOTHROID) 100 MCG tablet  Take 100 mcg by mouth daily before breakfast.   2 Taking  . metFORMIN (GLUCOPHAGE) 500 MG tablet Take 500 mg by mouth daily with breakfast.     Taking  . metoprolol succinate (TOPROL-XL) 50 MG 24 hr tablet Take 1 tablet (50 mg total) by mouth daily. 90 tablet 1 Taking  . Multiple Vitamins-Minerals (HAIR/SKIN/NAILS/BIOTIN) TABS Take 1 tablet by mouth daily.   Taking  . Polyethyl Glycol-Propyl Glycol (SYSTANE OP) Place 1 drop into both eyes 4 (four) times daily as needed (burning eyes).   Taking  . potassium chloride SA (K-DUR,KLOR-CON) 20 MEQ tablet Take 1 tablet (20 mEq total) by mouth daily after supper. 90 tablet 0   . sertraline (ZOLOFT) 100 MG tablet Take 50 mg by mouth at bedtime.    Taking  . spironolactone (ALDACTONE) 25 MG tablet Take 0.5 tablets (12.5 mg total) by mouth daily. 45 tablet 3 Taking  . vitamin C (ASCORBIC ACID) 500 MG tablet Take 500 mg by mouth 2 (two) times daily.   Taking  . VITAMIN E PO Take 1 tablet by mouth daily.   Taking   Allergies  Allergen Reactions  . Morphine Nausea And Vomiting  . Penicillins Swelling    Severe arm swelling and redness    Social History  Substance Use Topics  . Smoking status: Never Smoker  . Smokeless tobacco: Never Used  . Alcohol use No    Family History  Problem Relation Age of Onset  . Heart Problems Mother   . Diabetes Father   . Cirrhosis Father   . Diabetes Other      Review of Systems  Constitutional: Negative.   HENT: Negative.   Eyes: Negative.   Respiratory: Negative.   Cardiovascular: Negative.   Gastrointestinal: Negative.   Genitourinary: Negative.   Musculoskeletal: Positive for joint pain.  Skin: Negative.   Neurological: Negative.   Endo/Heme/Allergies: Negative.   Psychiatric/Behavioral: Negative.     Objective:  Physical Exam  Constitutional: She is oriented to person, place, and time. She appears well-developed.  HENT:  Head: Normocephalic.  Eyes: Pupils are equal, round, and reactive to  light.  Neck: Neck supple. No JVD present. No tracheal deviation present. No thyromegaly present.  Cardiovascular: Normal rate, regular rhythm and intact distal pulses.   Murmur heard. Respiratory: Effort normal and breath sounds normal. No respiratory distress. She has no wheezes.  GI: Soft. There is no tenderness. There is no guarding.  Musculoskeletal:       Right hip: She exhibits decreased range of motion, decreased strength, tenderness and bony tenderness. She exhibits no swelling, no deformity and no laceration.  Lymphadenopathy:    She has no cervical adenopathy.  Neurological: She is alert and oriented to person, place, and time.  Skin: Skin is warm and dry.  Psychiatric: She has a normal mood  and affect.      Labs:  Estimated body mass index is 24.55 kg/m as calculated from the following:   Height as of 10/04/16: 5\' 4"  (1.626 m).   Weight as of 10/04/16: 64.9 kg (143 lb).   Imaging Review Plain radiographs demonstrate severe degenerative joint disease of the right hip(s). The bone quality appears to be good for age and reported activity level.  Assessment/Plan:  End stage arthritis, right hip(s)  The patient history, physical examination, clinical judgement of the provider and imaging studies are consistent with end stage degenerative joint disease of the right hip(s) and total hip arthroplasty is deemed medically necessary. The treatment options including medical management, injection therapy, arthroscopy and arthroplasty were discussed at length. The risks and benefits of total hip arthroplasty were presented and reviewed. The risks due to aseptic loosening, infection, stiffness, dislocation/subluxation,  thromboembolic complications and other imponderables were discussed.  The patient acknowledged the explanation, agreed to proceed with the plan and consent was signed. Patient is being admitted for inpatient treatment for surgery, pain control, PT, OT, prophylactic  antibiotics, VTE prophylaxis, progressive ambulation and ADL's and discharge planning.The patient is planning to be discharged home.    Anastasio AuerbachMatthew S. Lashaunta Sicard   PA-C  02/18/2017, 10:09 AM

## 2017-02-21 ENCOUNTER — Other Ambulatory Visit (HOSPITAL_COMMUNITY): Payer: Self-pay | Admitting: *Deleted

## 2017-02-21 ENCOUNTER — Encounter (HOSPITAL_COMMUNITY): Payer: Self-pay

## 2017-02-21 NOTE — Progress Notes (Signed)
[facility]. EKG 10-04-16 ABNORMAL WILL REPEAT WITH PREOP 02-23-17.

## 2017-02-21 NOTE — Patient Instructions (Addendum)
Erin Herman  02/21/2017   Your procedure is scheduled on: Tuesday 03-01-17   [facility]  Report to admitting at 920 AM  Call this number if you have problems the morning of surgery  [phone]   Remember: ONLY 1 PERSON MAY GO WITH YOU TO SHORT STAY TO GET  READY MORNING OF YOUR SURGERY.  Do not eat food or drink liquids :After Midnight.     Take these medicines the morning of surgery with A SIP OF WATER:LEVOTHYROXINE (SYNTHROID), METOPROLOL SUCCINATE,  SYSTANE EYE DROP    DO NOT TAKE ANY DIABETIC MEDICATIONS DAY OF YOUR SURGERY     How to Manage Your Diabetes Before and After Surgery  Why is it important to control my blood sugar before and after surgery? . Improving blood sugar levels before and after surgery helps healing and can limit problems. . A way of improving blood sugar control is eating a healthy diet by: o  Eating less sugar and carbohydrates o  Increasing activity/exercise o  Talking with your doctor about reaching your blood sugar goals . High blood sugars (greater than 180 mg/dL) can raise your risk of infections and slow your recovery, so you will need to focus on controlling your diabetes during the weeks before surgery. . Make sure that the doctor who takes care of your diabetes knows about your planned surgery including the date and location.  How do I manage my blood sugar before surgery? . Check your blood sugar at least 4 times a day, starting 2 days before surgery, to make sure that the level is not too high or low. o Check your blood sugar the morning of your surgery when you wake up and every 2 hours until you get to the Short Stay unit. . If your blood sugar is less than 70 mg/dL, you will need to treat for low blood sugar: o Do not take insulin. o Treat a low blood sugar (less than 70 mg/dL) with  cup of clear juice (cranberry or apple), 4 glucose tablets, OR glucose gel. o Recheck blood sugar  in 15 minutes after treatment (to make sure it is greater than 70 mg/dL). If your blood sugar is not greater than 70 mg/dL on recheck, call [phone] for further instructions. . Report your blood sugar to the short stay nurse when you get to Short Stay.  . If you are admitted to the hospital after surgery: o Your blood sugar will be checked by the staff and you will probably be given insulin after surgery (instead of oral diabetes medicines) to make sure you have good blood sugar levels. o The goal for blood sugar control after surgery is 80-180 mg/dL.   WHAT DO I DO ABOUT MY DIABETES MEDICATION?  Marland Kitchen. Do not take oral diabetes medicines (pills) the morning of surgery.  . THE  DAY BEFORE SURGERY 02-28-17 TAKE YOUR METFORMIN AS USUAL .      Marland Kitchen. THE MORNING OF SURGERY 03-01-17  DO NOT TAKE YOUR METFORMIN.      Patient Signature:  Date:   Nurse Signature:  Date:   Reviewed and Endorsed by [facility] Patient Education Committee, August 2015                           You may not have any metal on your body including hair  pins and              piercings  Do not wear jewelry, make-up, lotions, powders or perfumes, deodorant             Do not wear nail polish.  Do not shave  48 hours prior to surgery.              Men may shave face and neck.   Do not bring valuables to the hospital. [place] IS NOT             RESPONSIBLE   FOR VALUABLES.  Contacts, dentures or bridgework may not be worn into surgery.  Leave suitcase in the car. After surgery it may be brought to your room.                   Please read over the following fact sheets you were given: _____________________________________________________________________             [facility] - Preparing for Surgery Before surgery, you can play an important role.  Because skin is not sterile, your skin needs to be as free of germs as possible.  You can reduce the number of germs on your skin by washing with CHG (chlorahexidine  gluconate) soap before surgery.  CHG is an antiseptic cleaner which kills germs and bonds with the skin to continue killing germs even after washing. Please DO NOT use if you have an allergy to CHG or antibacterial soaps.  If your skin becomes reddened/irritated stop using the CHG and inform your nurse when you arrive at Short Stay. Do not shave (including legs and underarms) for at least 48 hours prior to the first CHG shower.  You may shave your face/neck. Please follow these instructions carefully:  1.  Shower with CHG Soap the night before surgery and the  morning of Surgery.  2.  If you choose to wash your hair, wash your hair first as usual with your  normal  shampoo.  3.  After you shampoo, rinse your hair and body thoroughly to remove the  shampoo.                           4.  Use CHG as you would any other liquid soap.  You can apply chg directly  to the skin and wash                       Gently with a scrungie or clean washcloth.  5.  Apply the CHG Soap to your body ONLY FROM THE NECK DOWN.   Do not use on face/ open                           Wound or open sores. Avoid contact with eyes, ears mouth and genitals (private parts).                       Wash face,  Genitals (private parts) with your normal soap.             6.  Wash thoroughly, paying special attention to the area where your surgery  will be performed.  7.  Thoroughly rinse your body with warm water from the neck down.  8.  DO NOT shower/wash with your normal soap after using and rinsing off  the CHG Soap.  9.  Pat yourself dry with a clean towel.            10.  Wear clean pajamas.            11.  Place clean sheets on your bed the night of your first shower and do not  sleep with pets. Day of Surgery : Do not apply any lotions/deodorants the morning of surgery.  Please wear clean clothes to the hospital/surgery center.    Incentive Spirometer  An incentive spirometer is a tool that can help keep your  lungs clear and active. This tool measures how well you are filling your lungs with each breath. Taking long deep breaths may help reverse or decrease the chance of developing breathing (pulmonary) problems (especially infection) following:  A long period of time when you are unable to move or be active. BEFORE THE PROCEDURE   If the spirometer includes an indicator to show your best effort, your nurse or respiratory therapist will set it to a desired goal.  If possible, sit up straight or lean slightly forward. Try not to slouch.  Hold the incentive spirometer in an upright position. INSTRUCTIONS FOR USE  1. Sit on the edge of your bed if possible, or sit up as far as you can in bed or on a chair. 2. Hold the incentive spirometer in an upright position. 3. Breathe out normally. 4. Place the mouthpiece in your mouth and seal your lips tightly around it. 5. Breathe in slowly and as deeply as possible, raising the piston or the ball toward the top of the column. 6. Hold your breath for 3-5 seconds or for as long as possible. Allow the piston or ball to fall to the bottom of the column. 7. Remove the mouthpiece from your mouth and breathe out normally. 8. Rest for a few seconds and repeat Steps 1 through 7 at least 10 times every 1-2 hours when you are awake. Take your time and take a few normal breaths between deep breaths. 9. The spirometer may include an indicator to show your best effort. Use the indicator as a goal to work toward during each repetition. 10. After each set of 10 deep breaths, practice coughing to be sure your lungs are clear. If you have an incision (the cut made at the time of surgery), support your incision when coughing by placing a pillow or rolled up towels firmly against it. Once you are able to get out of bed, walk around indoors and cough well. You may stop using the incentive spirometer when instructed by your caregiver.  RISKS AND COMPLICATIONS  Take your time so  you do not get dizzy or light-headed.  If you are in pain, you may need to take or ask for pain medication before doing incentive spirometry. It is harder to take a deep breath if you are having pain. AFTER USE  Rest and breathe slowly and easily.  It can be helpful to keep track of a log of your progress. Your caregiver can provide you with a simple table to help with this. If you are using the spirometer at home, follow these instructions: SEEK MEDICAL CARE IF:   You are having difficultly using the spirometer.  You have trouble using the spirometer as often as instructed.  Your pain medication is not giving enough relief while using the spirometer.  You develop fever of 100.5 F (38.1 C) or higher. SEEK IMMEDIATE MEDICAL CARE IF:   You cough up bloody  sputum that had not been present before.  You develop fever of 102 F (38.9 C) or greater.  You develop worsening pain at or near the incision site. MAKE SURE YOU:   Understand these instructions.  Will watch your condition.  Will get help right away if you are not doing well or get worse. Document Released: 08/23/2006 Document Revised: 07/05/2011 Document Reviewed: 10/24/2006 Exit[employer] Patient Information 2014 ExitCare, [place].   ________________________________________________________________________  WHAT IS A BLOOD TRANSFUSION? Blood Transfusion Information  A transfusion is the replacement of blood or some of its parts. Blood is made up of multiple cells which provide different functions.  Red blood cells carry oxygen and are used for blood loss replacement.  White blood cells fight against infection.  Platelets control bleeding.  Plasma helps clot blood.  Other blood products are available for specialized needs, such as hemophilia or other clotting disorders. BEFORE THE TRANSFUSION  Who gives blood for transfusions?   Healthy volunteers who are fully evaluated to make sure their blood is safe. This is blood  bank blood. Transfusion therapy is the safest it has ever been in the practice of medicine. Before blood is taken from a donor, a complete history is taken to make sure that person has no history of diseases nor engages in risky social behavior (examples are intravenous drug use or sexual activity with multiple partners). The donor's travel history is screened to minimize risk of transmitting infections, such as malaria. The donated blood is tested for signs of infectious diseases, such as HIV and hepatitis. The blood is then tested to be sure it is compatible with you in order to minimize the chance of a transfusion reaction. If you or a relative donates blood, this is often done in anticipation of surgery and is not appropriate for emergency situations. It takes many days to process the donated blood. RISKS AND COMPLICATIONS Although transfusion therapy is very safe and saves many lives, the main dangers of transfusion include:   Getting an infectious disease.  Developing a transfusion reaction. This is an allergic reaction to something in the blood you were given. Every precaution is taken to prevent this. The decision to have a blood transfusion has been considered carefully by your caregiver before blood is given. Blood is not given unless the benefits outweigh the risks. AFTER THE TRANSFUSION  Right after receiving a blood transfusion, you will usually feel much better and more energetic. This is especially true if your red blood cells have gotten low (anemic). The transfusion raises the level of the red blood cells which carry oxygen, and this usually causes an energy increase.  The nurse administering the transfusion will monitor you carefully for complications. HOME CARE INSTRUCTIONS  No special instructions are needed after a transfusion. You may find your energy is better. Speak with your caregiver about any limitations on activity for underlying diseases you may have. SEEK MEDICAL CARE  IF:   Your condition is not improving after your transfusion.  You develop redness or irritation at the intravenous (IV) site. SEEK IMMEDIATE MEDICAL CARE IF:  Any of the following symptoms occur over the next 12 hours:  Shaking chills.  You have a temperature by mouth above 102 F (38.9 C), not controlled by medicine.  Chest, back, or muscle pain.  People around you feel you are not acting correctly or are confused.  Shortness of breath or difficulty breathing.  Dizziness and fainting.  You get a rash or develop hives.  You have a  decrease in urine output.  Your urine turns a dark color or changes to pink, red, or brown. Any of the following symptoms occur over the next 10 days:  You have a temperature by mouth above 102 F (38.9 C), not controlled by medicine.  Shortness of breath.  Weakness after normal activity.  The white part of the eye turns yellow (jaundice).  You have a decrease in the amount of urine or are urinating less often.  Your urine turns a dark color or changes to pink, red, or brown. Document Released: 04/09/2000 Document Revised: 07/05/2011 Document Reviewed: 11/27/2007 [facility] Patient Information 2014 [place], [place].  _______________________________________________________________________

## 2017-02-23 ENCOUNTER — Encounter (HOSPITAL_COMMUNITY): Payer: Self-pay | Admitting: *Deleted

## 2017-02-23 ENCOUNTER — Encounter (HOSPITAL_COMMUNITY)
Admission: RE | Admit: 2017-02-23 | Discharge: 2017-02-23 | Disposition: A | Discharge status: Home / Self Care | Payer: Medicare Other | Source: Ambulatory Visit | Attending: Orthopedic Surgery | Admitting: Orthopedic Surgery

## 2017-02-23 DIAGNOSIS — Z885 Allergy status to narcotic agent status: Secondary | ICD-10-CM | POA: Diagnosis not present

## 2017-02-23 DIAGNOSIS — Z96652 Presence of left artificial knee joint: Secondary | ICD-10-CM | POA: Diagnosis not present

## 2017-02-23 DIAGNOSIS — Z01818 Encounter for other preprocedural examination: Secondary | ICD-10-CM | POA: Insufficient documentation

## 2017-02-23 DIAGNOSIS — I359 Nonrheumatic aortic valve disorder, unspecified: Secondary | ICD-10-CM | POA: Diagnosis not present

## 2017-02-23 DIAGNOSIS — Z880 Allergy status to penicillin: Secondary | ICD-10-CM | POA: Diagnosis not present

## 2017-02-23 DIAGNOSIS — I447 Left bundle-branch block, unspecified: Secondary | ICD-10-CM | POA: Diagnosis not present

## 2017-02-23 DIAGNOSIS — I429 Cardiomyopathy, unspecified: Secondary | ICD-10-CM | POA: Diagnosis not present

## 2017-02-23 DIAGNOSIS — Z7984 Long term (current) use of oral hypoglycemic drugs: Secondary | ICD-10-CM | POA: Diagnosis not present

## 2017-02-23 DIAGNOSIS — Z01812 Encounter for preprocedural laboratory examination: Secondary | ICD-10-CM | POA: Insufficient documentation

## 2017-02-23 DIAGNOSIS — I498 Other specified cardiac arrhythmias: Secondary | ICD-10-CM | POA: Insufficient documentation

## 2017-02-23 DIAGNOSIS — Z833 Family history of diabetes mellitus: Secondary | ICD-10-CM | POA: Diagnosis not present

## 2017-02-23 DIAGNOSIS — Z8379 Family history of other diseases of the digestive system: Secondary | ICD-10-CM | POA: Insufficient documentation

## 2017-02-23 DIAGNOSIS — I5032 Chronic diastolic (congestive) heart failure: Secondary | ICD-10-CM | POA: Insufficient documentation

## 2017-02-23 DIAGNOSIS — M1611 Unilateral primary osteoarthritis, right hip: Secondary | ICD-10-CM | POA: Diagnosis not present

## 2017-02-23 DIAGNOSIS — I10 Essential (primary) hypertension: Secondary | ICD-10-CM | POA: Diagnosis not present

## 2017-02-23 DIAGNOSIS — I779 Disorder of arteries and arterioles, unspecified: Secondary | ICD-10-CM | POA: Insufficient documentation

## 2017-02-23 DIAGNOSIS — R9431 Abnormal electrocardiogram [ECG] [EKG]: Secondary | ICD-10-CM | POA: Diagnosis not present

## 2017-02-23 DIAGNOSIS — Z79899 Other long term (current) drug therapy: Secondary | ICD-10-CM | POA: Insufficient documentation

## 2017-02-23 DIAGNOSIS — I110 Hypertensive heart disease with heart failure: Secondary | ICD-10-CM | POA: Insufficient documentation

## 2017-02-23 DIAGNOSIS — E119 Type 2 diabetes mellitus without complications: Secondary | ICD-10-CM | POA: Diagnosis not present

## 2017-02-23 DIAGNOSIS — Z953 Presence of xenogenic heart valve: Secondary | ICD-10-CM | POA: Diagnosis not present

## 2017-02-23 DIAGNOSIS — I2510 Atherosclerotic heart disease of native coronary artery without angina pectoris: Secondary | ICD-10-CM | POA: Diagnosis not present

## 2017-02-23 DIAGNOSIS — E785 Hyperlipidemia, unspecified: Secondary | ICD-10-CM | POA: Insufficient documentation

## 2017-02-23 DIAGNOSIS — Z8249 Family history of ischemic heart disease and other diseases of the circulatory system: Secondary | ICD-10-CM | POA: Diagnosis not present

## 2017-02-23 HISTORY — DX: Chronic kidney disease, unspecified: N18.9

## 2017-02-23 HISTORY — DX: Anxiety disorder, unspecified: F41.9

## 2017-02-23 HISTORY — DX: Unspecified hearing loss, unspecified ear: H91.90

## 2017-02-23 HISTORY — DX: Gastro-esophageal reflux disease without esophagitis: K21.9

## 2017-02-23 HISTORY — DX: Family history of other specified conditions: Z84.89

## 2017-02-23 LAB — BASIC METABOLIC PANEL
Anion gap: 10 (ref 5–15)
BUN: 56 mg/dL — AB (ref 6–20)
CHLORIDE: 95 mmol/L — AB (ref 101–111)
CO2: 30 mmol/L (ref 22–32)
Calcium: 9.6 mg/dL (ref 8.9–10.3)
Creatinine, Ser: 1.66 mg/dL — ABNORMAL HIGH (ref 0.44–1.00)
GFR calc Af Amer: 31 mL/min — ABNORMAL LOW (ref >60)
GFR calc non Af Amer: 27 mL/min — ABNORMAL LOW (ref >60)
GLUCOSE: 125 mg/dL — AB (ref 65–99)
POTASSIUM: 5.2 mmol/L — AB (ref 3.5–5.1)
Sodium: 135 mmol/L (ref 135–145)

## 2017-02-23 LAB — CBC
HEMATOCRIT: 35.5 % — AB (ref 36.0–46.0)
HEMOGLOBIN: 12.1 g/dL (ref 12.0–15.0)
MCH: 31.9 pg (ref 26.0–34.0)
MCHC: 34.1 g/dL (ref 30.0–36.0)
MCV: 93.7 fL (ref 78.0–100.0)
Platelets: 276 10*3/uL (ref 150–400)
RBC: 3.79 MIL/uL — ABNORMAL LOW (ref 3.87–5.11)
RDW: 12.1 % (ref 11.5–15.5)
WBC: 8.1 10*3/uL (ref 4.0–10.5)

## 2017-02-23 LAB — ABO/RH: ABO/RH(D): A POS

## 2017-02-23 LAB — GLUCOSE, CAPILLARY: Glucose-Capillary: 131 mg/dL — ABNORMAL HIGH (ref 65–99)

## 2017-02-23 LAB — HEMOGLOBIN A1C
Hgb A1c MFr Bld: 6.5 % — ABNORMAL HIGH (ref 4.8–5.6)
Mean Plasma Glucose: 139.85 mg/dL

## 2017-02-23 LAB — SURGICAL PCR SCREEN
MRSA, PCR: NEGATIVE
STAPHYLOCOCCUS AUREUS: NEGATIVE

## 2017-02-23 NOTE — Progress Notes (Signed)
lov dr Sherryll Burgershah 02-09-17 on chart

## 2017-02-24 NOTE — Progress Notes (Signed)
BMET RESULTS FAXED TO DR OLIN BY EP[employer] 

## 2017-03-01 ENCOUNTER — Encounter (HOSPITAL_COMMUNITY)
Admission: RE | Disposition: A | Discharge status: Home / Self Care | Payer: Self-pay | Source: Ambulatory Visit | Attending: Orthopedic Surgery

## 2017-03-01 ENCOUNTER — Inpatient Hospital Stay (HOSPITAL_COMMUNITY): Payer: Medicare Other

## 2017-03-01 ENCOUNTER — Other Ambulatory Visit: Payer: Self-pay

## 2017-03-01 ENCOUNTER — Inpatient Hospital Stay (HOSPITAL_COMMUNITY): Payer: Medicare Other | Admitting: Anesthesiology

## 2017-03-01 ENCOUNTER — Inpatient Hospital Stay (HOSPITAL_COMMUNITY)
Admission: RE | Admit: 2017-03-01 | Discharge: 2017-03-02 | DRG: 470 | Disposition: A | Discharge status: Home / Self Care | Payer: Medicare Other | Source: Ambulatory Visit | Attending: Orthopedic Surgery | Admitting: Orthopedic Surgery

## 2017-03-01 ENCOUNTER — Encounter (HOSPITAL_COMMUNITY): Payer: Self-pay | Admitting: *Deleted

## 2017-03-01 DIAGNOSIS — Z96649 Presence of unspecified artificial hip joint: Secondary | ICD-10-CM

## 2017-03-01 DIAGNOSIS — I5032 Chronic diastolic (congestive) heart failure: Secondary | ICD-10-CM | POA: Diagnosis present

## 2017-03-01 DIAGNOSIS — Z953 Presence of xenogenic heart valve: Secondary | ICD-10-CM

## 2017-03-01 DIAGNOSIS — K219 Gastro-esophageal reflux disease without esophagitis: Secondary | ICD-10-CM | POA: Diagnosis present

## 2017-03-01 DIAGNOSIS — M1611 Unilateral primary osteoarthritis, right hip: Secondary | ICD-10-CM | POA: Diagnosis present

## 2017-03-01 DIAGNOSIS — Z7984 Long term (current) use of oral hypoglycemic drugs: Secondary | ICD-10-CM

## 2017-03-01 DIAGNOSIS — I2510 Atherosclerotic heart disease of native coronary artery without angina pectoris: Secondary | ICD-10-CM | POA: Diagnosis present

## 2017-03-01 DIAGNOSIS — I429 Cardiomyopathy, unspecified: Secondary | ICD-10-CM | POA: Diagnosis present

## 2017-03-01 DIAGNOSIS — F419 Anxiety disorder, unspecified: Secondary | ICD-10-CM | POA: Diagnosis present

## 2017-03-01 DIAGNOSIS — E782 Mixed hyperlipidemia: Secondary | ICD-10-CM | POA: Diagnosis present

## 2017-03-01 DIAGNOSIS — F329 Major depressive disorder, single episode, unspecified: Secondary | ICD-10-CM | POA: Diagnosis present

## 2017-03-01 DIAGNOSIS — H9190 Unspecified hearing loss, unspecified ear: Secondary | ICD-10-CM | POA: Diagnosis present

## 2017-03-01 DIAGNOSIS — Z96652 Presence of left artificial knee joint: Secondary | ICD-10-CM | POA: Diagnosis present

## 2017-03-01 DIAGNOSIS — N189 Chronic kidney disease, unspecified: Secondary | ICD-10-CM | POA: Diagnosis present

## 2017-03-01 DIAGNOSIS — Z79899 Other long term (current) drug therapy: Secondary | ICD-10-CM | POA: Diagnosis not present

## 2017-03-01 DIAGNOSIS — I130 Hypertensive heart and chronic kidney disease with heart failure and stage 1 through stage 4 chronic kidney disease, or unspecified chronic kidney disease: Secondary | ICD-10-CM | POA: Diagnosis present

## 2017-03-01 DIAGNOSIS — Z951 Presence of aortocoronary bypass graft: Secondary | ICD-10-CM

## 2017-03-01 DIAGNOSIS — E1122 Type 2 diabetes mellitus with diabetic chronic kidney disease: Secondary | ICD-10-CM | POA: Diagnosis present

## 2017-03-01 DIAGNOSIS — I447 Left bundle-branch block, unspecified: Secondary | ICD-10-CM | POA: Diagnosis present

## 2017-03-01 DIAGNOSIS — M25551 Pain in right hip: Secondary | ICD-10-CM | POA: Diagnosis present

## 2017-03-01 DIAGNOSIS — E039 Hypothyroidism, unspecified: Secondary | ICD-10-CM | POA: Diagnosis present

## 2017-03-01 HISTORY — PX: TOTAL HIP ARTHROPLASTY: SHX124

## 2017-03-01 LAB — TYPE AND SCREEN
ABO/RH(D): A POS
ANTIBODY SCREEN: NEGATIVE

## 2017-03-01 LAB — GLUCOSE, CAPILLARY
GLUCOSE-CAPILLARY: 124 mg/dL — AB (ref 65–99)
Glucose-Capillary: 114 mg/dL — ABNORMAL HIGH (ref 65–99)
Glucose-Capillary: 143 mg/dL — ABNORMAL HIGH (ref 65–99)

## 2017-03-01 SURGERY — ARTHROPLASTY, HIP, TOTAL, ANTERIOR APPROACH
Anesthesia: Spinal | Site: Hip | Laterality: Right

## 2017-03-01 MED ORDER — ONDANSETRON HCL 4 MG PO TABS: 4.0000 mg | ORAL_TABLET | Freq: Four times a day (QID) | ORAL | Status: DC | PRN

## 2017-03-01 MED ORDER — FUROSEMIDE 40 MG PO TABS
40.0000 mg | ORAL_TABLET | Freq: Two times a day (BID) | ORAL | Status: DC
  Administered 2017-03-01 – 2017-03-02 (×2): 40 mg via ORAL
  Filled 2017-03-01 (×2): qty 1

## 2017-03-01 MED ORDER — HYDROCODONE-ACETAMINOPHEN 7.5-325 MG PO TABS
1.0000 | ORAL_TABLET | ORAL | Status: DC | PRN
  Administered 2017-03-01 (×2): 1 via ORAL

## 2017-03-01 MED ORDER — DOCUSATE SODIUM 100 MG PO CAPS: 100.0000 mg | ORAL_CAPSULE | Freq: Two times a day (BID) | ORAL | 0 refills | Status: DC

## 2017-03-01 MED ORDER — HYDROCODONE-ACETAMINOPHEN 7.5-325 MG PO TABS
ORAL_TABLET | ORAL | Status: AC
  Filled 2017-03-01: qty 1

## 2017-03-01 MED ORDER — FENTANYL CITRATE (PF) 100 MCG/2ML IJ SOLN
INTRAMUSCULAR | Status: AC
  Filled 2017-03-01: qty 2

## 2017-03-01 MED ORDER — CHLORHEXIDINE GLUCONATE 4 % EX LIQD: 60.0000 mL | Freq: Once | CUTANEOUS | Status: DC

## 2017-03-01 MED ORDER — HYDROCODONE-ACETAMINOPHEN 7.5-325 MG PO TABS: 1.0000 | ORAL_TABLET | ORAL | 0 refills | Status: DC | PRN

## 2017-03-01 MED ORDER — INSULIN ASPART 100 UNIT/ML ~~LOC~~ SOLN
0.0000 [IU] | Freq: Three times a day (TID) | SUBCUTANEOUS | Status: DC
  Administered 2017-03-02 (×2): 2 [IU] via SUBCUTANEOUS

## 2017-03-01 MED ORDER — PHENOL 1.4 % MT LIQD
1.0000 | OROMUCOSAL | Status: DC | PRN
  Filled 2017-03-01: qty 177

## 2017-03-01 MED ORDER — POLYETHYLENE GLYCOL 3350 17 G PO PACK: 17.0000 g | PACK | Freq: Two times a day (BID) | ORAL | 0 refills | Status: DC

## 2017-03-01 MED ORDER — VANCOMYCIN HCL IN DEXTROSE 1-5 GM/200ML-% IV SOLN
INTRAVENOUS | Status: AC
  Administered 2017-03-01: 1000 mg via INTRAVENOUS
  Filled 2017-03-01: qty 200

## 2017-03-01 MED ORDER — SODIUM CHLORIDE 0.9 % IV SOLN
INTRAVENOUS | Status: DC
  Administered 2017-03-01: 19:00:00 via INTRAVENOUS

## 2017-03-01 MED ORDER — HYDROCODONE-ACETAMINOPHEN 7.5-325 MG PO TABS
2.0000 | ORAL_TABLET | ORAL | Status: DC | PRN
  Administered 2017-03-01 – 2017-03-02 (×4): 2 via ORAL
  Filled 2017-03-01 (×5): qty 2

## 2017-03-01 MED ORDER — METOCLOPRAMIDE HCL 5 MG/ML IJ SOLN: 5.0000 mg | Freq: Three times a day (TID) | INTRAMUSCULAR | Status: DC | PRN

## 2017-03-01 MED ORDER — MAGNESIUM CITRATE PO SOLN: 1.0000 | Freq: Once | ORAL | Status: DC | PRN

## 2017-03-01 MED ORDER — PROPOFOL 10 MG/ML IV BOLUS
INTRAVENOUS | Status: AC
  Filled 2017-03-01: qty 20

## 2017-03-01 MED ORDER — FENTANYL CITRATE (PF) 100 MCG/2ML IJ SOLN
25.0000 ug | INTRAMUSCULAR | Status: DC | PRN
  Administered 2017-03-01: 50 ug via INTRAVENOUS
  Administered 2017-03-01: 19:00:00 25 ug via INTRAVENOUS
  Filled 2017-03-01 (×2): qty 2

## 2017-03-01 MED ORDER — ASPIRIN 81 MG PO CHEW: 81.0000 mg | CHEWABLE_TABLET | Freq: Two times a day (BID) | ORAL | 0 refills | Status: AC

## 2017-03-01 MED ORDER — PROMETHAZINE HCL 25 MG/ML IJ SOLN: 6.2500 mg | INTRAMUSCULAR | Status: DC | PRN

## 2017-03-01 MED ORDER — BISACODYL 10 MG RE SUPP: 10.0000 mg | Freq: Every day | RECTAL | Status: DC | PRN

## 2017-03-01 MED ORDER — METHOCARBAMOL 500 MG PO TABS: 500.0000 mg | ORAL_TABLET | Freq: Four times a day (QID) | ORAL | 0 refills | Status: DC | PRN

## 2017-03-01 MED ORDER — METOCLOPRAMIDE HCL 5 MG PO TABS: 5.0000 mg | ORAL_TABLET | Freq: Three times a day (TID) | ORAL | Status: DC | PRN

## 2017-03-01 MED ORDER — DEXAMETHASONE SODIUM PHOSPHATE 10 MG/ML IJ SOLN
10.0000 mg | Freq: Once | INTRAMUSCULAR | Status: AC
  Administered 2017-03-02: 10:00:00 10 mg via INTRAVENOUS
  Filled 2017-03-01: qty 1

## 2017-03-01 MED ORDER — SERTRALINE HCL 50 MG PO TABS
50.0000 mg | ORAL_TABLET | Freq: Every day | ORAL | Status: DC
  Administered 2017-03-01: 22:00:00 50 mg via ORAL
  Filled 2017-03-01: qty 1

## 2017-03-01 MED ORDER — FENTANYL CITRATE (PF) 100 MCG/2ML IJ SOLN
INTRAMUSCULAR | Status: DC | PRN
  Administered 2017-03-01 (×2): 50 ug via INTRAVENOUS

## 2017-03-01 MED ORDER — METHOCARBAMOL 1000 MG/10ML IJ SOLN
500.0000 mg | Freq: Four times a day (QID) | INTRAVENOUS | Status: DC | PRN
  Administered 2017-03-01: 500 mg via INTRAVENOUS
  Filled 2017-03-01: qty 550

## 2017-03-01 MED ORDER — ALUM & MAG HYDROXIDE-SIMETH 200-200-20 MG/5ML PO SUSP: 15.0000 mL | ORAL | Status: DC | PRN

## 2017-03-01 MED ORDER — STERILE WATER FOR IRRIGATION IR SOLN
Status: DC | PRN
  Administered 2017-03-01: 2000 mL

## 2017-03-01 MED ORDER — METFORMIN HCL 500 MG PO TABS
500.0000 mg | ORAL_TABLET | Freq: Every day | ORAL | Status: DC
  Administered 2017-03-02: 08:00:00 500 mg via ORAL
  Filled 2017-03-01: qty 1

## 2017-03-01 MED ORDER — DOCUSATE SODIUM 100 MG PO CAPS
100.0000 mg | ORAL_CAPSULE | Freq: Two times a day (BID) | ORAL | Status: DC
  Administered 2017-03-01 – 2017-03-02 (×2): 100 mg via ORAL
  Filled 2017-03-01 (×2): qty 1

## 2017-03-01 MED ORDER — METOPROLOL SUCCINATE ER 50 MG PO TB24
50.0000 mg | ORAL_TABLET | Freq: Every day | ORAL | Status: DC
  Administered 2017-03-02: 50 mg via ORAL
  Filled 2017-03-01: qty 1

## 2017-03-01 MED ORDER — ONDANSETRON HCL 4 MG/2ML IJ SOLN
INTRAMUSCULAR | Status: DC | PRN
  Administered 2017-03-01: 4 mg via INTRAVENOUS

## 2017-03-01 MED ORDER — LEVOTHYROXINE SODIUM 88 MCG PO TABS
88.0000 ug | ORAL_TABLET | Freq: Every day | ORAL | Status: DC
  Administered 2017-03-02: 08:00:00 88 ug via ORAL
  Filled 2017-03-01: qty 1

## 2017-03-01 MED ORDER — BUPIVACAINE HCL (PF) 0.5 % IJ SOLN
INTRAMUSCULAR | Status: AC
  Filled 2017-03-01: qty 30

## 2017-03-01 MED ORDER — POLYETHYLENE GLYCOL 3350 17 G PO PACK
17.0000 g | PACK | Freq: Two times a day (BID) | ORAL | Status: DC
  Administered 2017-03-01 – 2017-03-02 (×2): 17 g via ORAL
  Filled 2017-03-01 (×2): qty 1

## 2017-03-01 MED ORDER — ONDANSETRON HCL 4 MG/2ML IJ SOLN: 4.0000 mg | Freq: Four times a day (QID) | INTRAMUSCULAR | Status: DC | PRN

## 2017-03-01 MED ORDER — MEPERIDINE HCL 50 MG/ML IJ SOLN: 6.2500 mg | INTRAMUSCULAR | Status: DC | PRN

## 2017-03-01 MED ORDER — SODIUM CHLORIDE 0.9 % IR SOLN
Status: DC | PRN
  Administered 2017-03-01: 1000 mL

## 2017-03-01 MED ORDER — PHENYLEPHRINE 40 MCG/ML (10ML) SYRINGE FOR IV PUSH (FOR BLOOD PRESSURE SUPPORT)
PREFILLED_SYRINGE | INTRAVENOUS | Status: DC | PRN
  Administered 2017-03-01 (×5): 80 ug via INTRAVENOUS

## 2017-03-01 MED ORDER — ASPIRIN 81 MG PO CHEW
81.0000 mg | CHEWABLE_TABLET | Freq: Two times a day (BID) | ORAL | Status: DC
  Administered 2017-03-01 – 2017-03-02 (×2): 81 mg via ORAL
  Filled 2017-03-01 (×2): qty 1

## 2017-03-01 MED ORDER — VANCOMYCIN HCL IN DEXTROSE 1-5 GM/200ML-% IV SOLN
1000.0000 mg | INTRAVENOUS | Status: AC
  Administered 2017-03-01: 1000 mg via INTRAVENOUS

## 2017-03-01 MED ORDER — LACTATED RINGERS IV SOLN
INTRAVENOUS | Status: DC
  Administered 2017-03-01 (×2): via INTRAVENOUS

## 2017-03-01 MED ORDER — ACETAMINOPHEN 650 MG RE SUPP: 650.0000 mg | RECTAL | Status: DC | PRN

## 2017-03-01 MED ORDER — FERROUS SULFATE 325 (65 FE) MG PO TABS
325.0000 mg | ORAL_TABLET | Freq: Three times a day (TID) | ORAL | 3 refills | Status: DC
Start: 2017-03-01 — End: 2019-02-22

## 2017-03-01 MED ORDER — BUPIVACAINE HCL (PF) 0.5 % IJ SOLN
INTRAMUSCULAR | Status: DC | PRN
  Administered 2017-03-01: 3 mL via INTRATHECAL

## 2017-03-01 MED ORDER — MENTHOL 3 MG MT LOZG: 1.0000 | LOZENGE | OROMUCOSAL | Status: DC | PRN

## 2017-03-01 MED ORDER — FERROUS SULFATE 325 (65 FE) MG PO TABS
325.0000 mg | ORAL_TABLET | Freq: Three times a day (TID) | ORAL | Status: DC
  Administered 2017-03-02 (×2): 325 mg via ORAL
  Filled 2017-03-01 (×2): qty 1

## 2017-03-01 MED ORDER — DEXAMETHASONE SODIUM PHOSPHATE 10 MG/ML IJ SOLN
10.0000 mg | Freq: Once | INTRAMUSCULAR | Status: AC
  Administered 2017-03-01: 10 mg via INTRAVENOUS

## 2017-03-01 MED ORDER — TRANEXAMIC ACID 1000 MG/10ML IV SOLN
1000.0000 mg | INTRAVENOUS | Status: AC
  Administered 2017-03-01: 1000 mg via INTRAVENOUS
  Filled 2017-03-01: qty 1100

## 2017-03-01 MED ORDER — TRANEXAMIC ACID 1000 MG/10ML IV SOLN
1000.0000 mg | Freq: Once | INTRAVENOUS | Status: DC
  Filled 2017-03-01: qty 10

## 2017-03-01 MED ORDER — FENTANYL CITRATE (PF) 100 MCG/2ML IJ SOLN: 25.0000 ug | INTRAMUSCULAR | Status: DC | PRN

## 2017-03-01 MED ORDER — ACETAMINOPHEN 325 MG PO TABS: 650.0000 mg | ORAL_TABLET | ORAL | Status: DC | PRN

## 2017-03-01 MED ORDER — POTASSIUM CHLORIDE CRYS ER 20 MEQ PO TBCR
20.0000 meq | EXTENDED_RELEASE_TABLET | Freq: Every day | ORAL | Status: DC
  Administered 2017-03-01: 19:00:00 20 meq via ORAL
  Filled 2017-03-01: qty 1

## 2017-03-01 MED ORDER — CELECOXIB 200 MG PO CAPS
200.0000 mg | ORAL_CAPSULE | Freq: Two times a day (BID) | ORAL | Status: DC
  Administered 2017-03-01 – 2017-03-02 (×2): 200 mg via ORAL
  Filled 2017-03-01 (×2): qty 1

## 2017-03-01 MED ORDER — SPIRONOLACTONE 12.5 MG HALF TABLET
12.5000 mg | ORAL_TABLET | Freq: Every day | ORAL | Status: DC
  Administered 2017-03-02: 12.5 mg via ORAL
  Filled 2017-03-01 (×2): qty 1

## 2017-03-01 MED ORDER — VANCOMYCIN HCL IN DEXTROSE 1-5 GM/200ML-% IV SOLN
1000.0000 mg | Freq: Two times a day (BID) | INTRAVENOUS | Status: AC
  Administered 2017-03-01: 1000 mg via INTRAVENOUS
  Filled 2017-03-01: qty 200

## 2017-03-01 MED ORDER — PROPOFOL 500 MG/50ML IV EMUL
INTRAVENOUS | Status: DC | PRN
Start: 2017-03-01 — End: 2017-03-01
  Administered 2017-03-01: 40 ug/kg/min via INTRAVENOUS

## 2017-03-01 MED ORDER — KETOROLAC TROMETHAMINE 30 MG/ML IJ SOLN: 30.0000 mg | Freq: Once | INTRAMUSCULAR | Status: DC | PRN

## 2017-03-01 MED ORDER — METHOCARBAMOL 500 MG PO TABS
500.0000 mg | ORAL_TABLET | Freq: Four times a day (QID) | ORAL | Status: DC | PRN
  Administered 2017-03-01: 22:00:00 500 mg via ORAL
  Filled 2017-03-01: qty 1

## 2017-03-01 MED ORDER — DEXTROSE 5 % IV SOLN
INTRAVENOUS | Status: DC | PRN
  Administered 2017-03-01: 25 ug/min via INTRAVENOUS

## 2017-03-01 SURGICAL SUPPLY — 40 items
ADH SKN CLS APL DERMABOND .7 (GAUZE/BANDAGES/DRESSINGS) ×1
BAG SPEC THK2 15X12 ZIP CLS (MISCELLANEOUS)
BAG ZIPLOCK 12X15 (MISCELLANEOUS) IMPLANT
BLADE SAG 18X100X1.27 (BLADE) ×3 IMPLANT
CAPT HIP TOTAL 2 ×2 IMPLANT
CLOTH BEACON ORANGE TIMEOUT ST (SAFETY) ×3 IMPLANT
COVER PERINEAL POST (MISCELLANEOUS) ×3 IMPLANT
COVER SURGICAL LIGHT HANDLE (MISCELLANEOUS) ×3 IMPLANT
DERMABOND ADVANCED (GAUZE/BANDAGES/DRESSINGS) ×2
DERMABOND ADVANCED .7 DNX12 (GAUZE/BANDAGES/DRESSINGS) ×1 IMPLANT
DRAPE STERI IOBAN 125X83 (DRAPES) ×3 IMPLANT
DRAPE U-SHAPE 47X51 STRL (DRAPES) ×6 IMPLANT
DRESSING AQUACEL AG SP 3.5X10 (GAUZE/BANDAGES/DRESSINGS) ×1 IMPLANT
DRSG AQUACEL AG SP 3.5X10 (GAUZE/BANDAGES/DRESSINGS) ×3
DURAPREP 26ML APPLICATOR (WOUND CARE) ×3 IMPLANT
ELECT REM PT RETURN 15FT ADLT (MISCELLANEOUS) ×3 IMPLANT
GLOVE BIOGEL M STRL SZ7.5 (GLOVE) ×4 IMPLANT
GLOVE BIOGEL PI IND STRL 6.5 (GLOVE) IMPLANT
GLOVE BIOGEL PI IND STRL 7.0 (GLOVE) IMPLANT
GLOVE BIOGEL PI IND STRL 7.5 (GLOVE) ×1 IMPLANT
GLOVE BIOGEL PI INDICATOR 6.5 (GLOVE) ×2
GLOVE BIOGEL PI INDICATOR 7.0 (GLOVE) ×2
GLOVE BIOGEL PI INDICATOR 7.5 (GLOVE) ×14
GLOVE ECLIPSE 6.5 STRL STRAW (GLOVE) ×2 IMPLANT
GLOVE ORTHO TXT STRL SZ7.5 (GLOVE) ×3 IMPLANT
GLOVE SURG SS PI 6.5 STRL IVOR (GLOVE) ×2 IMPLANT
GLOVE SURG SS PI 7.5 STRL IVOR (GLOVE) ×2 IMPLANT
GOWN STRL REUS W/ TWL XL LVL3 (GOWN DISPOSABLE) IMPLANT
GOWN STRL REUS W/TWL LRG LVL3 (GOWN DISPOSABLE) ×5 IMPLANT
GOWN STRL REUS W/TWL XL LVL3 (GOWN DISPOSABLE) ×10 IMPLANT
HOLDER FOLEY CATH W/STRAP (MISCELLANEOUS) ×3 IMPLANT
PACK ANTERIOR HIP CUSTOM (KITS) ×3 IMPLANT
SUT MNCRL AB 4-0 PS2 18 (SUTURE) ×3 IMPLANT
SUT STRATAFIX 0 PDS 27 VIOLET (SUTURE) ×3
SUT VIC AB 1 CT1 36 (SUTURE) ×9 IMPLANT
SUT VIC AB 2-0 CT1 27 (SUTURE) ×6
SUT VIC AB 2-0 CT1 TAPERPNT 27 (SUTURE) ×2 IMPLANT
SUTURE STRATFX 0 PDS 27 VIOLET (SUTURE) ×1 IMPLANT
TRAY FOLEY CATH SILVER 14FR (SET/KITS/TRAYS/PACK) ×2 IMPLANT
YANKAUER SUCT BULB TIP 10FT TU (MISCELLANEOUS) ×2 IMPLANT

## 2017-03-01 NOTE — Op Note (Signed)
NAME:  Erin Herman                ACCOUNT NO.: [id number]      MEDICAL RECORD NO.: [id number]      [facility]      PHYSICIAN:  Durene RomansLIN,Sigrid Schwebach D  DATE OF BIRTH:  1927-06-22     DATE OF PROCEDURE:  03/01/2017                                 OPERATIVE REPORT         PREOPERATIVE DIAGNOSIS: Right  hip osteoarthritis.      POSTOPERATIVE DIAGNOSIS:  Right hip osteoarthritis.      PROCEDURE:  Right total hip replacement through an anterior approach   utilizing DePuy THR system, component size 52mm pinnacle cup, a size 36+4 neutral   Altrex liner, a size 5 Standard Tri Lock stem with a 36+1.5 Articuleze metal ball.      SURGEON:  Madlyn FrankelMatthew D. Charlann Boxerlin, M.D.      ASSISTANT:  Lanney GinsMatthew Babish, PA-C     ANESTHESIA:  Spinal.      SPECIMENS:  None.      COMPLICATIONS:  None.      BLOOD LOSS:  200 cc     DRAINS:  None.      INDICATION OF THE PROCEDURE:  Erin Herman is a 83 y.o. female who had   presented to office for evaluation of right hip pain.  Radiographs revealed   progressive degenerative changes with bone-on-bone   articulation to the  hip joint.  The patient had painful limited range of   motion significantly affecting their overall quality of life.  The patient was failing to    respond to conservative measures, and at this point was ready   to proceed with more definitive measures.  The patient has noted progressive   degenerative changes in his hip, progressive problems and dysfunction   with regarding the hip prior to surgery.  Consent was obtained for   benefit of pain relief.  Specific risk of infection, DVT, component   failure, dislocation, need for revision surgery, as well discussion of   the anterior versus posterior approach were reviewed.  Consent was   obtained for benefit of anterior pain relief through an anterior   approach.      PROCEDURE IN DETAIL:  The patient was brought to operative theater.   Once adequate  anesthesia, preoperative antibiotics, 2 gm Ancef, 1 gm of Tranexamic Acid, and 10 mg of Decadron administered.   The patient was positioned supine on the OSI Hanna table.  Once adequate   padding of boney process was carried out, we had predraped out the hip, and  used fluoroscopy to confirm orientation of the pelvis and position.      The right hip was then prepped and draped from proximal iliac crest to   mid thigh with shower curtain technique.      Time-out was performed identifying the patient, planned procedure, and   extremity.     An incision was then made 2 cm distal and lateral to the   anterior superior iliac spine extending over the orientation of the   tensor fascia lata muscle and sharp dissection was carried down to the   fascia of the muscle and protractor placed in the soft tissues.      The fascia was then incised.  The  muscle belly was identified and swept   laterally and retractor placed along the superior neck.  Following   cauterization of the circumflex vessels and removing some pericapsular   fat, a second cobra retractor was placed on the inferior neck.  A third   retractor was placed on the anterior acetabulum after elevating the   anterior rectus.  A L-capsulotomy was along the line of the   superior neck to the trochanteric fossa, then extended proximally and   distally.  Tag sutures were placed and the retractors were then placed   intracapsular.  We then identified the trochanteric fossa and   orientation of my neck cut, confirmed this radiographically   and then made a neck osteotomy with the femur on traction.  The femoral   head was removed without difficulty or complication.  Traction was let   off and retractors were placed posterior and anterior around the   acetabulum.      The labrum and foveal tissue were debrided.  I began reaming with a 46mm   reamer and reamed up to 51mm reamer with good bony bed preparation and a 52mm   cup was chosen.  The  final 52mm Pinnacle cup was then impacted under fluoroscopy  to confirm the depth of penetration and orientation with respect to   abduction.  A screw was placed followed by the hole eliminator.  The final   36+4 neutral Altrex liner was impacted with good visualized rim fit.  The cup was positioned anatomically within the acetabular portion of the pelvis.      At this point, the femur was rolled at 80 degrees.  Further capsule was   released off the inferior aspect of the femoral neck.  I then   released the superior capsule proximally.  The hook was placed laterally   along the femur and elevated manually and held in position with the bed   hook.  The leg was then extended and adducted with the leg rolled to 100   degrees of external rotation.  Once the proximal femur was fully   exposed, I used a box osteotome to set orientation.  I then began   broaching with the starting chili pepper broach and passed this by hand and then broached up to 5.  With the 5 broach in place I chose a standard offset neck and did several trial reductions.  The offset was appropriate, leg lengths   appeared to be equal best matched with the +1.5 confirmed radiographically.   Given these findings, I went ahead and dislocated the hip, repositioned all   retractors and positioned the right hip in the extended and abducted position.  The final 5 standard Tri Lock stem was   chosen and it was impacted down to the level of neck cut.  Based on this   and the trial reduction, a 36+1.5 Articuleze metal head ball was chosen and   impacted onto a clean and dry trunnion, and the hip was reduced.  The   hip had been irrigated throughout the case again at this point.  I did   reapproximate the superior capsular leaflet to the anterior leaflet   using #1 Vicryl.  The fascia of the   tensor fascia lata muscle was then reapproximated using #1 Vicryl and #0 Stratafix sutures.  The   remaining wound was closed with 2-0 Vicryl and  running 4-0 Monocryl.   The hip was cleaned, dried, and dressed sterilely using Dermabond and  Aquacel dressing.  She was then brought   to recovery room in stable condition tolerating the procedure well.    Lanney GinsMatthew Babish, PA-C was present for the entirety of the case involved from   preoperative positioning, perioperative retractor management, general   facilitation of the case, as well as primary wound closure as assistant.            Madlyn FrankelMatthew D. Charlann Boxerlin, M.D.        03/01/2017 12:33 PM

## 2017-03-01 NOTE — Anesthesia Preprocedure Evaluation (Addendum)
Anesthesia Evaluation  Patient identified by MRN, date of birth, ID band Patient awake    Reviewed: Allergy & Precautions, NPO status , Patient's Chart, lab work & pertinent test results  History of Anesthesia Complications (+) PONV  Airway Mallampati: II  TM Distance: >3 FB Neck ROM: Full    Dental   Pulmonary neg pulmonary ROS,    Pulmonary exam normal        Cardiovascular hypertension, + CAD, + Cardiac Stents, + Peripheral Vascular Disease and +CHF  + dysrhythmias (LBBB)  Rhythm:Regular  S/p CABG and bioprosthetic AV replacement in 2006. 07/2013 Echo EF 50-55%, Normal functioning AV.   Neuro/Psych negative neurological ROS     GI/Hepatic Neg liver ROS, hiatal hernia, GERD  ,  Endo/Other  diabetes, Type 2Hypothyroidism   Renal/GU Renal InsufficiencyRenal diseasenegative Renal ROS     Musculoskeletal  (+) Arthritis ,   Abdominal   Peds  Hematology  (+) anemia ,   Anesthesia Other Findings   Reproductive/Obstetrics                            Anesthesia Physical  Anesthesia Plan  ASA: III  Anesthesia Plan: Spinal   Post-op Pain Management:  [facility]ional for Post-op pain   Induction: Intravenous  PONV Risk Score and Plan: 3  Airway Management Planned: Natural Airway and Simple Face Mask  Additional Equipment:   Intra-op Plan:   Post-operative Plan:   Informed Consent: I have reviewed the patients History and Physical, chart, labs and discussed the procedure including the risks, benefits and alternatives for the proposed anesthesia with the patient or authorized representative who has indicated his/her understanding and acceptance.   Dental advisory given  Plan Discussed with: CRNA  Anesthesia Plan Comments:         Anesthesia Quick Evaluation

## 2017-03-01 NOTE — Transfer of Care (Signed)
Immediate Anesthesia Transfer of Care Note  Patient: Erin Herman  Procedure(s) Performed: RIGHT TOTAL HIP ARTHROPLASTY ANTERIOR APPROACH (Right Hip)  Patient Location: PACU  Anesthesia Type:Spinal  Level of Consciousness: sedated  Airway & Oxygen Therapy: Patient Spontanous Breathing and Patient connected to face mask oxygen  Post-op Assessment: Report given to RN and Post -op Vital signs reviewed and stable  Post vital signs: Reviewed and stable  Last Vitals:  Vitals:   03/01/17 0925  BP: 127/60  Pulse: 73  Resp: 16  Temp: 36.8 C  SpO2: 100%    Last Pain:  Vitals:   03/01/17 0925  TempSrc: Oral         Complications: No apparent anesthesia complications

## 2017-03-01 NOTE — Interval H&P Note (Signed)
History and Physical Interval Note:  03/01/2017 10:11 AM  Erin Herman  has presented today for surgery, with the diagnosis of Right hip osteoarthritis  The various methods of treatment have been discussed with the patient and family. After consideration of risks, benefits and other options for treatment, the patient has consented to  Procedure(s) with comments: RIGHT TOTAL HIP ARTHROPLASTY ANTERIOR APPROACH (Right) - 70 mins as a surgical intervention .  The patient's history has been reviewed, patient examined, no change in status, stable for surgery.  I have reviewed the patient's chart and labs.  Questions were answered to the patient's satisfaction.     Shelda PalLIN,Leya Paige D

## 2017-03-01 NOTE — Discharge Instructions (Signed)

## 2017-03-01 NOTE — Anesthesia Procedure Notes (Signed)
Spinal  Patient location during procedure: OR Staffing Anesthesiologist: Nolon Nations, MD Performed: anesthesiologist  Preanesthetic Checklist Completed: patient identified, site marked, surgical consent, pre-op evaluation, timeout performed, IV checked, risks and benefits discussed and monitors and equipment checked Spinal Block Patient position: sitting Prep: Betadine, ChloraPrep and site prepped and draped Patient monitoring: heart rate, continuous pulse ox and blood pressure Approach: left paramedian Location: L2-3 Injection technique: single-shot Needle Needle type: Sprotte  Needle gauge: 24 G Needle length: 9 cm Assessment Sensory level: T8 Additional Notes Expiration date of kit checked and confirmed. Patient tolerated procedure well, without complications.

## 2017-03-01 NOTE — Anesthesia Postprocedure Evaluation (Signed)
Anesthesia Post Note  Patient: Erin Herman  Procedure(s) Performed: RIGHT TOTAL HIP ARTHROPLASTY ANTERIOR APPROACH (Right Hip)     Patient location during evaluation: PACU Anesthesia Type: Spinal Level of consciousness: awake and alert Pain management: pain level controlled Vital Signs Assessment: post-procedure vital signs reviewed and stable Respiratory status: spontaneous breathing and respiratory function stable Cardiovascular status: blood pressure returned to baseline and stable Postop Assessment: spinal receding Anesthetic complications: no    Last Vitals:  Vitals:   03/01/17 1415 03/01/17 1430  BP: 116/65 129/75  Pulse: 62 65  Resp: 11 18  Temp:    SpO2: 100% 100%    Last Pain:  Vitals:   03/01/17 0925  TempSrc: Oral    LLE Motor Response: Purposeful movement;Responds to commands (03/01/17 1430) LLE Sensation: Numbness (03/01/17 1430) RLE Motor Response: Purposeful movement;Responds to commands (03/01/17 1430) RLE Sensation: Numbness (03/01/17 1430) L Sensory Level: L3-Anterior knee, lower leg (03/01/17 1430) R Sensory Level: L2-Upper inner thigh, upper buttock (03/01/17 1430)  Lewie LoronJohn Zhoe Catania

## 2017-03-02 ENCOUNTER — Encounter (HOSPITAL_COMMUNITY): Payer: Self-pay | Admitting: Orthopedic Surgery

## 2017-03-02 LAB — BASIC METABOLIC PANEL
Anion gap: 7 (ref 5–15)
BUN: 35 mg/dL — AB (ref 6–20)
CO2: 27 mmol/L (ref 22–32)
CREATININE: 1.12 mg/dL — AB (ref 0.44–1.00)
Calcium: 7.6 mg/dL — ABNORMAL LOW (ref 8.9–10.3)
Chloride: 99 mmol/L — ABNORMAL LOW (ref 101–111)
GFR, EST AFRICAN AMERICAN: 50 mL/min — AB (ref >60)
GFR, EST NON AFRICAN AMERICAN: 43 mL/min — AB (ref >60)
Glucose, Bld: 150 mg/dL — ABNORMAL HIGH (ref 65–99)
POTASSIUM: 4.5 mmol/L (ref 3.5–5.1)
SODIUM: 133 mmol/L — AB (ref 135–145)

## 2017-03-02 LAB — CBC
HCT: 28.4 % — ABNORMAL LOW (ref 36.0–46.0)
HEMOGLOBIN: 9.5 g/dL — AB (ref 12.0–15.0)
MCH: 31.4 pg (ref 26.0–34.0)
MCHC: 33.5 g/dL (ref 30.0–36.0)
MCV: 93.7 fL (ref 78.0–100.0)
PLATELETS: 209 10*3/uL (ref 150–400)
RBC: 3.03 MIL/uL — AB (ref 3.87–5.11)
RDW: 12.1 % (ref 11.5–15.5)
WBC: 8.6 10*3/uL (ref 4.0–10.5)

## 2017-03-02 LAB — GLUCOSE, CAPILLARY
GLUCOSE-CAPILLARY: 138 mg/dL — AB (ref 65–99)
GLUCOSE-CAPILLARY: 138 mg/dL — AB (ref 65–99)

## 2017-03-02 NOTE — Progress Notes (Signed)
     Subjective: 1 Day Post-Op Procedure(s) (LRB): RIGHT TOTAL HIP ARTHROPLASTY ANTERIOR APPROACH (Right)   Seen by Dr. Charlann Boxerlin.  Patient reports pain as mild, pain controlled. No events throughout the night.  Ready to be discharged home.   Objective:   VITALS:   Vitals:   03/02/17 0131 03/02/17 0630  BP: 125/63 (!) 104/57  Pulse: 68 63  Resp: 14 16  Temp: 98.1 F (36.7 C) 97.9 F (36.6 C)  SpO2: 98% 100%    Dorsiflexion/Plantar flexion intact Incision: dressing C/D/I No cellulitis present Compartment soft  LABS Recent Labs    03/02/17 0551  HGB 9.5*  HCT 28.4*  WBC 8.6  PLT 209    Recent Labs    03/02/17 0551  NA 133*  K 4.5  BUN 35*  CREATININE 1.12*  GLUCOSE 150*     Assessment/Plan: 1 Day Post-Op Procedure(s) (LRB): RIGHT TOTAL HIP ARTHROPLASTY ANTERIOR APPROACH (Right) Foley cath d/c'ed Advance diet Up with therapy D/C IV fluids Discharge home after PT [facility]. Follow up with OLIN,Ella Guillotte D in 2 weeks.  Contact information:  [facility] [address], [address] [place] [id number] [phone]           Anastasio AuerbachMatthew S. Manolito Jurewicz   PAC  03/02/2017, 8:21 AM

## 2017-03-02 NOTE — Evaluation (Signed)
Physical Therapy Evaluation Patient Details Name: Erin Herman MRN: [id number] DOB: November 16, 1927 Today's Date: 03/02/2017   History of Present Illness  Pt s/p R THR and with hx of bilat TKR  Clinical Impression  Pt s/p R THR and presents with decreased R LE strength/ROM and post op pain limiting functional mobility.  Pt should progress to dc home with family assist.    Follow Up Recommendations DC plan and follow up therapy as arranged by surgeon    Equipment Recommendations  None recommended by PT    Recommendations for Other Services OT consult     Precautions / Restrictions Precautions Precautions: Fall Restrictions Weight Bearing Restrictions: No      Mobility  Bed Mobility Overal bed mobility: Needs Assistance Bed Mobility: Supine to Sit     Supine to sit: Min assist     General bed mobility comments: cues for sequence and use of L LE to self assist  Transfers Overall transfer level: Needs assistance Equipment used: Rolling walker (2 wheeled) Transfers: Sit to/from Stand Sit to Stand: Min assist;Min guard         General transfer comment: cues for LE management and use of UEs to self assist  Ambulation/Gait Ambulation/Gait assistance: Min assist;Min guard Ambulation Distance (Feet): 100 Feet Assistive device: Rolling walker (2 wheeled) Gait Pattern/deviations: Step-to pattern;Step-through pattern;Decreased step length - right;Decreased step length - left;Shuffle;Trunk flexed Gait velocity: decr Gait velocity interpretation: Below normal speed for age/gender General Gait Details: cues for posture, position from RW and initial sequence  Stairs            Wheelchair Mobility    Modified Rankin (Stroke Patients Only)       Balance Overall balance assessment: Needs assistance Sitting-balance support: No upper extremity supported;Feet supported Sitting balance-Leahy Scale: Good     Standing balance support: Bilateral upper extremity  supported;During functional activity Standing balance-Leahy Scale: Poor                               Pertinent Vitals/Pain Pain Assessment: 0-10 Pain Score: 4  Pain Location: R hip surgical area Pain Descriptors / Indicators: Sore;Operative site guarding Pain Intervention(s): Limited activity within patient's tolerance;Monitored during session;Premedicated before session;Ice applied    Home Living Family/patient expects to be discharged to:: Private residence Living Arrangements: Spouse/significant other Available Help at Discharge: Family;Available 24 hours/day Type of Home: House Home Access: Stairs to enter Entrance Stairs-Rails: Right;Left;Can reach both Entrance Stairs-Number of Steps: 2 Home Layout: One level Home Equipment: Walker - 2 wheels;Walker - 4 wheels;Shower seat      Prior Function Level of Independence: Independent               Hand Dominance   Dominant Hand: Right    Extremity/Trunk Assessment   Upper Extremity Assessment Upper Extremity Assessment: Overall WFL for tasks assessed    Lower Extremity Assessment Lower Extremity Assessment: RLE deficits/detail RLE Deficits / Details: Strength at hip 2/5 with AAROM at hip to 80 flex and 20 abd    Cervical / Trunk Assessment Cervical / Trunk Assessment: Normal  Communication   Communication: No difficulties  Cognition Arousal/Alertness: Awake/alert Behavior During Therapy: WFL for tasks assessed/performed Overall Cognitive Status: Within Functional Limits for tasks assessed  General Comments      Exercises Total Joint Exercises Ankle Circles/Pumps: AROM;Both;15 reps;Supine Quad Sets: AROM;Both;10 reps;Supine Heel Slides: AAROM;Right;20 reps;Supine Hip ABduction/ADduction: AAROM;Right;15 reps;Supine   Assessment/Plan    PT Assessment Patient needs continued PT services  PT Problem List Decreased strength;Decreased  range of motion;Decreased activity tolerance;Decreased mobility;Decreased knowledge of use of DME;Pain       PT Treatment Interventions DME instruction;Gait training;Stair training;Functional mobility training;Therapeutic activities;Therapeutic exercise;Patient/family education    PT Goals (Current goals can be found in the Care Plan section)  Acute Rehab PT Goals Patient Stated Goal: go home PT Goal Formulation: With patient Time For Goal Achievement: 03/04/17 Potential to Achieve Goals: Good    Frequency 7X/week   Barriers to discharge        Co-evaluation               AM-PAC PT "6 Clicks" Daily Activity  Outcome Measure Difficulty turning over in bed (including adjusting bedclothes, sheets and blankets)?: Unable Difficulty moving from lying on back to sitting on the side of the bed? : Unable Difficulty sitting down on and standing up from a chair with arms (e.g., wheelchair, bedside commode, etc,.)?: Unable Help needed moving to and from a bed to chair (including a wheelchair)?: A Little Help needed walking in hospital room?: A Little Help needed climbing 3-5 steps with a railing? : A Lot 6 Click Score: 11    End of Session Equipment Utilized During Treatment: Gait belt Activity Tolerance: Patient tolerated treatment well Patient left: in chair;with call bell/phone within reach Nurse Communication: Mobility status PT Visit Diagnosis: Difficulty in walking, not elsewhere classified (R26.2)    Time: [id number] PT Time Calculation (min) (ACUTE ONLY): 38 min   Charges:   PT Evaluation $PT Eval Low Complexity: 1 Low PT Treatments $Gait Training: 8-22 mins $Therapeutic Exercise: 8-22 mins   PT G Codes:        Pg [phone]   Merie Wulf 03/02/2017, 2:31 PM

## 2017-03-02 NOTE — Evaluation (Signed)
Occupational Therapy Evaluation Patient Details Name: Erin JoDemetris Herman MRN: [id number] DOB: May 18, 1927 Today's Date: 03/02/2017    History of Present Illness R THA, anterior approach   Clinical Impression   Pt is at min A level with LB ADLs and min guard A - sup level with ADL mobility using RW. Pt has all necessary DME and A/E at home form previous knee surgeries and will have assist from family prn. All education completed and no further acute OT is indicated at this time    Follow Up Recommendations  Supervision - Intermittent    Equipment Recommendations  None recommended by OT    Recommendations for Other Services       Precautions / Restrictions Restrictions Weight Bearing Restrictions: No      Mobility Bed Mobility               General bed mobility comments: pt up in recliner upon OT arrival  Transfers Overall transfer level: Needs assistance Equipment used: Rolling walker (2 wheeled) Transfers: Sit to/from Stand Sit to Stand: Min guard;Supervision         General transfer comment: min guard A from recliner to RW initially    Balance Overall balance assessment: Needs assistance Sitting-balance support: No upper extremity supported;Feet supported Sitting balance-Leahy Scale: Good     Standing balance support: Bilateral upper extremity supported;During functional activity Standing balance-Leahy Scale: Poor                             ADL either performed or assessed with clinical judgement   ADL Overall ADL's : Needs assistance/impaired     Grooming: Wash/dry hands;Wash/dry face;Standing;Min guard;Supervision/safety   Upper Body Bathing: Set up;Supervision/ safety;Sitting   Lower Body Bathing: Minimal assistance   Upper Body Dressing : Set up;Supervision/safety;Sitting   Lower Body Dressing: Minimal assistance   Toilet Transfer: Min guard;RW;Comfort height toilet;Grab bars;Ambulation   Toileting- Clothing Manipulation and  Hygiene: Min guard;Sit to/from stand   Tub/ Shower Transfer: Minimal assistance;Min guard;3 in 1;Grab bars;Ambulation;Rolling walker   Functional mobility during ADLs: Min guard       Vision Baseline Vision/History: Wears glasses Wears Glasses: At all times Patient Visual Report: No change from baseline       Perception     Praxis      Pertinent Vitals/Pain Pain Assessment: 0-10 Pain Score: 4  Pain Location: R hip surgical area Pain Descriptors / Indicators: Sore;Operative site guarding Pain Intervention(s): Monitored during session;Repositioned     Hand Dominance Right   Extremity/Trunk Assessment Upper Extremity Assessment Upper Extremity Assessment: Overall WFL for tasks assessed   Lower Extremity Assessment Lower Extremity Assessment: Defer to PT evaluation       Communication Communication Communication: No difficulties   Cognition Arousal/Alertness: Awake/alert Behavior During Therapy: WFL for tasks assessed/performed Overall Cognitive Status: Within Functional Limits for tasks assessed                                     General Comments   pt very pleasant and cooperative    Exercises     Shoulder Instructions      Home Living Family/patient expects to be discharged to:: Private residence Living Arrangements: Spouse/significant other Available Help at Discharge: Family;Available 24 hours/day Type of Home: House Home Access: Stairs to enter Enter[employer] CorporationEntrance Stairs-Number of Steps: 2 Entrance Stairs-Rails: None Home Layout: One level  Bathroom Shower/Tub: Chief Strategy OfficerTub/shower unit   Bathroom Toilet: Standard Bathroom Accessibility: Yes How Accessible: Accessible via walker Home Equipment: Walker - 2 wheels;Walker - 4 wheels;Shower seat          Prior Functioning/Environment Level of Independence: Independent                 OT Problem List: Decreased activity tolerance;Pain;Impaired balance (sitting and/or standing)      OT  Treatment/Interventions:      OT Goals(Current goals can be found in the care plan section) Acute Rehab OT Goals Patient Stated Goal: go home OT Goal Formulation: With patient Time For Goal Achievement: 03/09/17 Potential to Achieve Goals: Good  OT Frequency:     Barriers to D/C:    no barriers       Co-evaluation              AM-PAC PT "6 Clicks" Daily Activity     Outcome Measure Help from another person eating meals?: None Help from another person taking care of personal grooming?: A Little Help from another person toileting, which includes using toliet, bedpan, or urinal?: A Little Help from another person bathing (including washing, rinsing, drying)?: A Little Help from another person to put on and taking off regular upper body clothing?: A Little Help from another person to put on and taking off regular lower body clothing?: A Little 6 Click Score: 19   End of Session Equipment Utilized During Treatment: Gait belt;Rolling walker;Other (comment)(3 in 1)  Activity Tolerance: Patient tolerated treatment well Patient left: in chair  OT Visit Diagnosis: Unsteadiness on feet (R26.81);Muscle weakness (generalized) (M62.81);Pain Pain - Right/Left: Right Pain - part of body: Hip                Time: [id number] OT Time Calculation (min): 25 min Charges:  OT General Charges $OT Visit: 1 Visit OT Evaluation $OT Eval Low Complexity: 1 Low OT Treatments $Therapeutic Activity: 8-22 mins G-Codes: OT G-codes **NOT FOR INPATIENT CLASS** Functional Assessment Tool Used: AM-PAC 6 Clicks Daily Activity     Galen ManilaSpencer, Florena Kozma Jeanette 03/02/2017, 1:27 PM

## 2017-03-02 NOTE — Progress Notes (Signed)
Physical Therapy Treatment Patient Details Name: Erin Herman MRN: [id number] DOB: 03/03/1928 Today's Date: 03/02/2017    History of Present Illness Pt s/p R THR and with hx of bilat TKR    PT Comments    Pt motivated and progressing well with mobility.  Family present and reviewed home therex with progression, stairs and car transfers.   Follow Up Recommendations  DC plan and follow up therapy as arranged by surgeon     Equipment Recommendations  None recommended by PT    Recommendations for Other Services OT consult     Precautions / Restrictions Precautions Precautions: Fall Restrictions Weight Bearing Restrictions: No Other Position/Activity Restrictions: WBAT    Mobility  Bed Mobility Overal bed mobility: Needs Assistance Bed Mobility: Supine to Sit;Sit to Supine     Supine to sit: Min guard Sit to supine: Min guard   General bed mobility comments: cues for sequence and use of L LE to self assist  Transfers Overall transfer level: Needs assistance Equipment used: Rolling walker (2 wheeled) Transfers: Sit to/from Stand Sit to Stand: Min guard;Supervision         General transfer comment: cues for LE management and use of UEs to self assist  Ambulation/Gait Ambulation/Gait assistance: Min guard;Supervision Ambulation Distance (Feet): 150 Feet Assistive device: Rolling walker (2 wheeled) Gait Pattern/deviations: Step-to pattern;Step-through pattern;Decreased step length - right;Decreased step length - left;Shuffle;Trunk flexed Gait velocity: decr Gait velocity interpretation: Below normal speed for age/gender General Gait Details: cues for posture, position from RW and initial sequence   Stairs Stairs: Yes   Stair Management: No rails;Two rails;Step to pattern;Backwards;Forwards;With walker Number of Stairs: 3 General stair comments: 2 stairs fwd with bilat rails, single step bkwd for stool into bed.  Cues for sequence   Wheelchair  Mobility    Modified Rankin (Stroke Patients Only)       Balance Overall balance assessment: Needs assistance Sitting-balance support: No upper extremity supported;Feet supported Sitting balance-Leahy Scale: Good     Standing balance support: Bilateral upper extremity supported;During functional activity Standing balance-Leahy Scale: Poor                              Cognition Arousal/Alertness: Awake/alert Behavior During Therapy: WFL for tasks assessed/performed Overall Cognitive Status: Within Functional Limits for tasks assessed                                        Exercises Total Joint Exercises Ankle Circles/Pumps: AROM;Both;15 reps;Supine Quad Sets: AROM;Both;10 reps;Supine Heel Slides: AAROM;Right;20 reps;Supine Hip ABduction/ADduction: AAROM;Right;15 reps;Supine Long Arc Quad: AROM;Right;10 reps;Seated    General Comments        Pertinent Vitals/Pain Pain Assessment: 0-10 Pain Score: 4  Pain Location: R hip surgical area Pain Descriptors / Indicators: Sore;Operative site guarding Pain Intervention(s): Limited activity within patient's tolerance;Monitored during session;Premedicated before session    Home Living Family/patient expects to be discharged to:: Private residence Living Arrangements: Spouse/significant other Available Help at Discharge: Family;Available 24 hours/day Type of Home: House Home Access: Stairs to enter Entrance Stairs-Rails: Right;Left;Can reach both Home Layout: One level Home Equipment: Environmental consultantWalker - 2 wheels;Walker - 4 wheels;Shower seat      Prior Function Level of Independence: Independent          PT Goals (current goals can now be found in the care plan section) Acute Rehab PT  Goals Patient Stated Goal: go home PT Goal Formulation: With patient Time For Goal Achievement: 03/04/17 Potential to Achieve Goals: Good Progress towards PT goals: Progressing toward goals    Frequency     7X/week      PT Plan Current plan remains appropriate    Co-evaluation              AM-PAC PT "6 Clicks" Daily Activity  Outcome Measure  Difficulty turning over in bed (including adjusting bedclothes, sheets and blankets)?: Unable Difficulty moving from lying on back to sitting on the side of the bed? : A Lot Difficulty sitting down on and standing up from a chair with arms (e.g., wheelchair, bedside commode, etc,.)?: A Lot Help needed moving to and from a bed to chair (including a wheelchair)?: A Little Help needed walking in hospital room?: A Little Help needed climbing 3-5 steps with a railing? : A Little 6 Click Score: 14    End of Session Equipment Utilized During Treatment: Gait belt Activity Tolerance: Patient tolerated treatment well Patient left: in chair;with call bell/phone within reach Nurse Communication: Mobility status PT Visit Diagnosis: Difficulty in walking, not elsewhere classified (R26.2)     Time: [id number] PT Time Calculation (min) (ACUTE ONLY): 50 min  Charges:  $Gait Training: 8-22 mins $Therapeutic Exercise: 8-22 mins $Therapeutic Activity: 8-22 mins                    G Codes:       Pg [phone]    Aden Youngman 03/02/2017, 2:39 PM

## 2017-03-03 ENCOUNTER — Telehealth: Payer: Self-pay | Admitting: Cardiology

## 2017-03-03 MED ORDER — METOLAZONE 5 MG PO TABS: ORAL_TABLET | ORAL | 0 refills | Status: DC

## 2017-03-03 NOTE — Telephone Encounter (Signed)
Pt aware and voiced understanding. Says she has been taking same dose of lasix and aldactone since LOV this past June. Metolazone 5 mg sent to pharmacy.

## 2017-03-03 NOTE — Telephone Encounter (Signed)
Pt says she has gained around 9 lbs since Tuesday. Denies SOB/dizziness/chest pain - says swelling is in feet/face/hands/stomach. Pt says she did call surgeon (pt recently underwent hip replacement)  who suggested her symptoms sounded heart related and to contact Dr Diona BrownerMcDowell.

## 2017-03-03 NOTE — Telephone Encounter (Signed)
Patient called stating that she was discharged from hospital on 03/02/17 from surgery. She states that she is retaining fluid.

## 2017-03-03 NOTE — Telephone Encounter (Signed)
Would add Zaroxolyn 5 mg daily for 2 or 3 days.  This has been effective for her in the past.  I presume she is continuing on prior dose of Lasix and Aldactone.

## 2017-03-08 NOTE — Discharge Summary (Signed)
Physician Discharge Summary  Patient ID: Erin Herman MRN: [id number] DOB/AGE: November 15, 1927 83 y.o.  Admit date: 03/01/2017 Discharge date: 03/02/2017   Procedures:  Procedure(s) (LRB): RIGHT TOTAL HIP ARTHROPLASTY ANTERIOR APPROACH (Right)  Attending Physician:  Dr. Durene Romans   Admission Diagnoses:   Right hip primary OA / pain  Discharge Diagnoses:  Principal Problem:   S/P right THA, AA  Past Medical History:  Diagnosis Date  . Anemia   . Anxiety   . Aortic stenosis    Bioprosthetic AVR 2006  . Arthritis    oa  . CAD (coronary artery disease), native coronary artery    Multivessel status post CABG 2006  . Cardiomyopathy (HCC)   . Chronic kidney disease   . Depression   . Essential hypertension   . Family history of adverse reaction to anesthesia    daughter ponv  . GERD (gastroesophageal reflux disease)   . Headache    none recent  . History of hiatal hernia   . HOH (hard of hearing)    both ears  . LBBB (left bundle branch block)   . Mixed hyperlipidemia   . Postoperative nausea and vomiting    Morphine  . Type 2 diabetes mellitus (HCC)   . UTI (lower urinary tract infection)     HPI:    Erin Herman, 83 y.o. female, has a history of pain and functional disability in the right hip(s) due to arthritis and patient has failed non-surgical conservative treatments for greater than 12 weeks to include NSAID's and/or analgesics, corticosteriod injections, use of assistive devices and activity modification.  Onset of symptoms was gradual starting ~2 years ago with gradually worsening course since that time.The patient noted no past surgery on the right hip(s).  Patient currently rates pain in the right hip at 8 out of 10 with activity. Patient has night pain, worsening of pain with activity and weight bearing, trendelenberg gait, pain that interfers with activities of daily living and pain with passive range of motion. Patient has evidence of  periarticular osteophytes and joint space narrowing by imaging studies. This condition presents safety issues increasing the risk of falls. There is no current active infection.  Risks, benefits and expectations were discussed with the patient.  Risks including but not limited to the risk of anesthesia, blood clots, nerve damage, blood vessel damage, failure of the prosthesis, infection and up to and including death.  Patient understand the risks, benefits and expectations and wishes to proceed with surgery.  PCP: Kirstie Peri, MD   Discharged Condition: good  Hospital Course:  Patient underwent the above stated procedure on 03/01/2017. Patient tolerated the procedure well and brought to the recovery room in good condition and subsequently to the floor.  POD #1 BP: 104/57 ; Pulse: 63 ; Temp: 97.9 F (36.6 C) ; Resp: 16 Patient reports pain as mild, pain controlled. No events throughout the night.  Ready to be discharged home.  Dorsiflexion/plantar flexion intact, incision: dressing C/D/I, no cellulitis present and compartment soft.   LABS  Basename    HGB     9.5  HCT     28.4    Discharge Exam: General appearance: alert, cooperative and no distress Extremities: Homans sign is negative, no sign of DVT, no edema, redness or tenderness in the calves or thighs and no ulcers, gangrene or trophic changes  Disposition: Home with follow up in 2 weeks   Follow-up Information    Durene Romans, MD. Schedule an appointment as soon  as possible for a visit in 2 week(s).   Specialty:  Orthopedic Surgery Contact information: [address] [place] [id number] [phone]           Discharge Instructions    Call MD / Call 911   Complete by:  As directed    If you experience chest pain or shortness of breath, CALL 911 and be transported to the hospital emergency room.  If you develope a fever above 101 F, pus (white drainage) or increased drainage or redness at the wound,  or calf pain, call your surgeon's office.   Change dressing   Complete by:  As directed    Maintain surgical dressing until follow up in the clinic. If the edges start to pull up, may reinforce with tape. If the dressing is no longer working, may remove and cover with gauze and tape, but must keep the area dry and clean.  Call with any questions or concerns.   Constipation Prevention   Complete by:  As directed    Drink plenty of fluids.  Prune juice may be helpful.  You may use a stool softener, such as Colace (over the counter) 100 mg twice a day.  Use MiraLax (over the counter) for constipation as needed.   Diet - low sodium heart healthy   Complete by:  As directed    Discharge instructions   Complete by:  As directed    Maintain surgical dressing until follow up in the clinic. If the edges start to pull up, may reinforce with tape. If the dressing is no longer working, may remove and cover with gauze and tape, but must keep the area dry and clean.  [facility]. Call with any questions or concerns.   Increase activity slowly as tolerated   Complete by:  As directed    Weight bearing as tolerated with assist device (walker, cane, etc) as directed, use it as long as suggested by your surgeon or therapist, typically at least 4-6 weeks.   TED hose   Complete by:  As directed    Use stockings (TED hose) for 2 weeks on both leg(s).  You may remove them at night for sleeping.      Allergies as of 03/02/2017      Reactions   Morphine Nausea And Vomiting   Penicillins Swelling, Other (See Comments)   Severe arm swelling and redness      Medication List    STOP taking these medications   ibuprofen 200 MG tablet Commonly known as:  ADVIL,MOTRIN   traMADol 50 MG tablet Commonly known as:  ULTRAM     TAKE these medications   aspirin 81 MG chewable tablet Commonly known as:  ASPIRIN CHILDRENS Chew 1 tablet (81 mg total) 2 (two) times daily by mouth.    benazepril 10 MG tablet Commonly known as:  LOTENSIN Take 10 mg by mouth daily.   docusate sodium 100 MG capsule Commonly known as:  COLACE Take 1 capsule (100 mg total) 2 (two) times daily by mouth.   ferrous sulfate 325 (65 FE) MG tablet Commonly known as:  FERROUSUL Take 1 tablet (325 mg total) 3 (three) times daily with meals by mouth.   furosemide 40 MG tablet Commonly known as:  LASIX Take 40 mg by mouth 2 (two) times daily.   HAIR/SKIN/NAILS/BIOTIN Tabs Take 1 tablet by mouth daily.   MULTIVITAMIN PO Take 1 tablet by mouth daily.   HYDROcodone-acetaminophen  7.5-325 MG tablet Commonly known as:  NORCO Take 1-2 tablets every 4 (four) hours as needed by mouth for moderate pain or severe pain.   levothyroxine 88 MCG tablet Commonly known as:  SYNTHROID, LEVOTHROID Take 88 mcg by mouth daily before breakfast.   metFORMIN 500 MG tablet Commonly known as:  GLUCOPHAGE Take 500 mg by mouth daily with breakfast.   methocarbamol 500 MG tablet Commonly known as:  ROBAXIN Take 1 tablet (500 mg total) every 6 (six) hours as needed by mouth for muscle spasms.   metoprolol succinate 50 MG 24 hr tablet Commonly known as:  TOPROL-XL Take 1 tablet (50 mg total) by mouth daily.   polyethylene glycol packet Commonly known as:  MIRALAX / GLYCOLAX Take 17 g 2 (two) times daily by mouth.   potassium chloride SA 20 MEQ tablet Commonly known as:  K-DUR,KLOR-CON Take 1 tablet (20 mEq total) by mouth daily after supper.   sertraline 100 MG tablet Commonly known as:  ZOLOFT Take 50 mg by mouth at bedtime.   spironolactone 25 MG tablet Commonly known as:  ALDACTONE Take 0.5 tablets (12.5 mg total) by mouth daily.   SYSTANE OP Place 1 drop into both eyes 4 (four) times daily as needed (for burning eyes).   vitamin C 500 MG tablet Commonly known as:  ASCORBIC ACID Take 500 mg by mouth 2 (two) times daily.   Vitamin D3 5000 units Tabs Take 5,000 Units by mouth daily.     VITAMIN E PO Take 1 tablet by mouth daily.            Discharge Care Instructions  (From admission, onward)        Start     Ordered   03/02/17 0000  Change dressing    Comments:  Maintain surgical dressing until follow up in the clinic. If the edges start to pull up, may reinforce with tape. If the dressing is no longer working, may remove and cover with gauze and tape, but must keep the area dry and clean.  Call with any questions or concerns.   03/02/17 [id number]       Signed: Anastasio AuerbachMatthew S. Reda Gettis   PA-C  03/08/2017, 8:50 PM

## 2017-03-11 ENCOUNTER — Telehealth: Payer: Self-pay | Admitting: *Deleted

## 2017-03-11 NOTE — Telephone Encounter (Signed)
PA approved for metolazone through 03/14/18 - notice scanned into chart

## 2017-04-04 ENCOUNTER — Ambulatory Visit: Payer: Medicare Other | Admitting: Cardiology

## 2017-05-13 NOTE — Progress Notes (Signed)
Cardiology Office Note  Date: 05/16/2017   ID: Erin Herman, DOB 1927-11-15, MRN [id number]  PCP: Monico Blitz, MD  Primary Cardiologist: Rozann Lesches, MD   Chief Complaint  Patient presents with  . Diastolic heart failure    History of Present Illness: Erin Herman is an 84 y.o. female last seen in June 2018. She is here today with her daughter for a follow-up visit. She did undergo right total hip arthroplasty in November 2018. There were no obvious perioperative cardiac complications. She did have some postoperative fluid overload but this has resolved. She has been on a stable diuretic regimen as outlined below in her weight is also relatively stable.  We went over her current cardiac medications which are stable and outlined below. She also continues to follow with Dr. Manuella Ghazi.  She has been able to ambulate more easily without pain, still uses a cane. She does not report any recent falls.  I reviewed her echocardiogram from August 2017 as outlined below.  Past Medical History:  Diagnosis Date  . Anemia   . Anxiety   . Aortic stenosis    Bioprosthetic AVR 2006  . Arthritis    oa  . CAD (coronary artery disease), native coronary artery    Multivessel status post CABG 2006  . Cardiomyopathy ([place])   . Chronic kidney disease   . Depression   . Essential hypertension   . Family history of adverse reaction to anesthesia    daughter ponv  . GERD (gastroesophageal reflux disease)   . Headache    none recent  . History of hiatal hernia   . HOH (hard of hearing)    both ears  . LBBB (left bundle branch block)   . Mixed hyperlipidemia   . Postoperative nausea and vomiting    Morphine  . Type 2 diabetes mellitus ([place])   . UTI (lower urinary tract infection)     Past Surgical History:  Procedure Laterality Date  . ABDOMINAL HYSTERECTOMY     partial  . AORTIC VALVE REPLACEMENT  2006   Dr. Roxy Manns - 23 mm [place] stentless cadaver valve  . CORONARY  ARTERY BYPASS GRAFT  2006    Dr. Roxy Manns - LIMA to LAD, SVG to circumflex  . TOTAL HIP ARTHROPLASTY Right 03/01/2017   Procedure: RIGHT TOTAL HIP ARTHROPLASTY ANTERIOR APPROACH;  Surgeon: Paralee Cancel, MD;  Location: [facility];  Service: Orthopedics;  Laterality: Right;  70 mins  . TOTAL KNEE ARTHROPLASTY Right 2006  . TOTAL KNEE ARTHROPLASTY Left 05/20/2014   DR Noemi Chapel  . TOTAL KNEE ARTHROPLASTY Left 05/20/2014   Procedure: LEFT TOTAL KNEE ARTHROPLASTY;  Surgeon: Lorn Junes, MD;  Location: [facility];  Service: Orthopedics;  Laterality: Left;    Current Outpatient Medications  Medication Sig Dispense Refill  . benazepril (LOTENSIN) 10 MG tablet Take 10 mg by mouth daily.     . Cholecalciferol (VITAMIN D3) 5000 UNITS TABS Take 5,000 Units by mouth daily.    Marland Kitchen denosumab (PROLIA) 60 MG/ML SOLN injection Inject 60 mg into the skin every 6 (six) months.    . ferrous sulfate (FERROUSUL) 325 (65 FE) MG tablet Take 1 tablet (325 mg total) 3 (three) times daily with meals by mouth.  3  . furosemide (LASIX) 40 MG tablet Take 1 tablet (40 mg total) by mouth 2 (two) times daily. 180 tablet 3  . levothyroxine (SYNTHROID, LEVOTHROID) 88 MCG tablet Take 88 mcg by mouth daily before breakfast.   2  . metFORMIN (  GLUCOPHAGE) 500 MG tablet Take 500 mg by mouth daily with breakfast.      . metolazone (ZAROXOLYN) 5 MG tablet Take 1 tablet daily for 2-3 days 15 tablet 0  . metoprolol succinate (TOPROL-XL) 50 MG 24 hr tablet Take 1 tablet (50 mg total) by mouth daily. 90 tablet 1  . Multiple Vitamins-Minerals (HAIR/SKIN/NAILS/BIOTIN) TABS Take 1 tablet by mouth daily.    . Multiple Vitamins-Minerals (MULTIVITAMIN PO) Take 1 tablet by mouth daily.    Vladimir Faster Glycol-Propyl Glycol (SYSTANE OP) Place 1 drop into both eyes 4 (four) times daily as needed (for burning eyes).     . potassium chloride SA (K-DUR,KLOR-CON) 20 MEQ tablet Take 1 tablet (20 mEq total) by mouth daily after supper. 90 tablet 0  . psyllium  (METAMUCIL) 58.6 % powder Take 1 packet by mouth daily.    . sertraline (ZOLOFT) 100 MG tablet Take 50 mg by mouth at bedtime.     Marland Kitchen spironolactone (ALDACTONE) 25 MG tablet Take 0.5 tablets (12.5 mg total) by mouth daily. 45 tablet 3  . vitamin C (ASCORBIC ACID) 500 MG tablet Take 500 mg by mouth 2 (two) times daily.    Marland Kitchen VITAMIN E PO Take 1 tablet by mouth daily.     No current facility-administered medications for this visit.    Allergies:  Morphine and Penicillins   Social History: The patient  reports that  has never smoked. she has never used smokeless tobacco. She reports that she does not drink alcohol or use drugs.   ROS:  Please see the history of present illness. Otherwise, complete review of systems is positive for hearing loss.  All other systems are reviewed and negative.   Physical Exam: VS:  BP (!) 100/52   Pulse 84   Ht _0  (1.626 m)   Wt 141 lb 9.6 oz (64.2 kg)   LMP  (LMP Unknown)   SpO2 96%   BMI 24.31 kg/m , BMI Body mass index is 24.31 kg/m.  Wt Readings from Last 3 Encounters:  05/16/17 141 lb 9.6 oz (64.2 kg)  03/01/17 138 lb (62.6 kg)  02/23/17 138 lb (62.6 kg)    General: Elderly woman, appears comfortable at rest. HEENT: Conjunctiva and lids normal, oropharynx clear. Neck: Supple, no elevated JVP or carotid bruits, no thyromegaly. Lungs: Clear to auscultation, nonlabored breathing at rest. Cardiac: Regular rate and rhythm, no S3, 2/6 systolic murmur, no pericardial rub. Abdomen: Soft, nontender, bowel sounds present. Extremities: No pitting edema, distal pulses 2+. Skin: Warm and dry. Musculoskeletal: No kyphosis. Neuropsychiatric: Alert and oriented x3, affect grossly appropriate.  ECG: I personally reviewed the tracing from 02/23/2017 which showed sinus rhythm with left bundle branch block.  Recent Labwork: 03/02/2017: BUN 35; Creatinine, Ser 1.12; Hemoglobin 9.5; Platelets 209; Potassium 4.5; Sodium 133   Other Studies Reviewed  Today:  Echocardiogram 12/11/2015: Study Conclusions  - Left ventricle: The cavity size was normal. Wall thickness was   increased in a pattern of mild LVH. Systolic function was normal.   The estimated ejection fraction was in the range of 60% to 65%.   Wall motion was normal; there were no regional wall motion   abnormalities. Features are consistent with a pseudonormal left   ventricular filling pattern, with concomitant abnormal relaxation   and increased filling pressure (grade 2 diastolic dysfunction). - Ventricular septum: The contour showed diastolic flattening. - Aortic valve: 23 mm [place] stentless porcine valve in aortic   position. Mean gradient (S): 14  mm Hg. Peak gradient (S): 24 mm   Hg. VTI ratio of LVOT to aortic valve: 0.59. - Mitral valve: Calcified annulus. Mildly thickened leaflets .   There was moderate regurgitation. - Left atrium: The atrium was mildly dilated. - Right ventricle: The cavity size was mildly dilated. Systolic   function was mildly reduced. - Right atrium: The atrium was mildly dilated. The Eustachian valve   appeared prominant. Central venous pressure (est): 3 mm Hg. - Tricuspid valve: There was mild-moderate regurgitation. - Pulmonary arteries: PA peak pressure: 46 mm Hg (S). - Pericardium, extracardiac: There was no pericardial effusion.  Impressions:  - Mild LVH with LVEF 60-65%. Grade 2 diastolic dysfunction with   increased LV filling pressure. Diastolic septal flattening   suggesting increased RV volume. Mild left atrial enlargement.   Moderate MAC with thickened mitral leaflets and moderate mitral   regurgitation. Grossly normal bioprosthetic AVR function as   outlined above with mean gradient 14 mmHg and no significant   perivalvular regurgitation. Mild RV enlargement with mildly   reduced contraction. Mild to moderate tricuspid regurgitation   with PASP 46 mmHg. Mild right atrial enlargement, prominent   Eustachian valve  noted.  Assessment and Plan:  1. Chronic diastolic heart failure with right ventricular dysfunction. She continues on stable diuretic regimen with reasonable fluid status at this time. No changes were made today.  2. Aortic stenosis status post bioprosthetic AVR. Echocardiogram from 2017 as outlined above with mean gradient 14 mmHg.  3. Multivessel CAD status post CABG with overall reassuring ischemic workup from 2015. No active angina symptoms.  4. Essential hypertension, blood pressure well controlled today.  Current medicines were reviewed with the patient today.  Disposition: Follow-up in 6 months.  Signed, Satira Sark, MD, [facility] 05/16/2017 2:34 PM    [place] [facility] [place], [place],  [address] Phone: [phone]; Fax: [phone]

## 2017-05-16 ENCOUNTER — Ambulatory Visit: Payer: Medicare Other | Admitting: Cardiology

## 2017-05-16 ENCOUNTER — Other Ambulatory Visit: Payer: Self-pay | Admitting: *Deleted

## 2017-05-16 ENCOUNTER — Encounter: Payer: Self-pay | Admitting: Cardiology

## 2017-05-16 VITALS — BP 100/52 | HR 84 | Ht 64.0 in | Wt 141.6 lb

## 2017-05-16 DIAGNOSIS — I10 Essential (primary) hypertension: Secondary | ICD-10-CM

## 2017-05-16 DIAGNOSIS — I5032 Chronic diastolic (congestive) heart failure: Secondary | ICD-10-CM

## 2017-05-16 DIAGNOSIS — I2510 Atherosclerotic heart disease of native coronary artery without angina pectoris: Secondary | ICD-10-CM

## 2017-05-16 DIAGNOSIS — Z953 Presence of xenogenic heart valve: Secondary | ICD-10-CM | POA: Diagnosis not present

## 2017-05-16 MED ORDER — FUROSEMIDE 40 MG PO TABS: 40.0000 mg | ORAL_TABLET | Freq: Two times a day (BID) | ORAL | 3 refills | Status: DC

## 2017-05-16 NOTE — Patient Instructions (Signed)

## 2017-11-21 NOTE — Progress Notes (Signed)
Cardiology Office Note  Date: 11/22/2017   ID: Erin Herman, DOB [birth year], MRN [id number]  PCP: Monico Blitz, MD  Primary Cardiologist: Rozann Lesches, MD   Chief Complaint  Patient presents with  . Diastolic heart failure    History of Present Illness: Erin Herman is an 84 y.o. female last seen in January.  She is here for a routine visit.  Reports occasional feeling of swelling in her ankles and abdomen, although weight has been relatively stable.  She is actually down a few pounds compared to January.  Otherwise no chest pain or palpitations.  We went over her current medications.  She is on Aldactone, Lasix, Lotensin, Toprol-XL, potassium supplements, and also has metolazone to use as needed.  She is due for a follow-up physical with lab work per Dr. Manuella Ghazi in the next month.  Previous echocardiogram from 2017 is outlined below.  Past Medical History:  Diagnosis Date  . Anemia   . Anxiety   . Aortic stenosis    Bioprosthetic AVR 2006  . Arthritis    oa  . CAD (coronary artery disease), native coronary artery    Multivessel status post CABG 2006  . Cardiomyopathy ([place])   . Chronic kidney disease   . Depression   . Essential hypertension   . Family history of adverse reaction to anesthesia    daughter ponv  . GERD (gastroesophageal reflux disease)   . Headache    none recent  . History of hiatal hernia   . HOH (hard of hearing)    both ears  . LBBB (left bundle branch block)   . Mixed hyperlipidemia   . Postoperative nausea and vomiting    Morphine  . Type 2 diabetes mellitus ([place])   . UTI (lower urinary tract infection)     Past Surgical History:  Procedure Laterality Date  . ABDOMINAL HYSTERECTOMY     partial  . AORTIC VALVE REPLACEMENT  2006   Dr. Roxy Manns - 23 mm [place] stentless cadaver valve  . CORONARY ARTERY BYPASS GRAFT  2006    Dr. Roxy Manns - LIMA to LAD, SVG to circumflex  . TOTAL HIP ARTHROPLASTY Right 03/01/2017   Procedure:  RIGHT TOTAL HIP ARTHROPLASTY ANTERIOR APPROACH;  Surgeon: Paralee Cancel, MD;  Location: [facility];  Service: Orthopedics;  Laterality: Right;  70 mins  . TOTAL KNEE ARTHROPLASTY Right 2006  . TOTAL KNEE ARTHROPLASTY Left 05/20/2014   DR Noemi Chapel  . TOTAL KNEE ARTHROPLASTY Left 05/20/2014   Procedure: LEFT TOTAL KNEE ARTHROPLASTY;  Surgeon: Lorn Junes, MD;  Location: [facility];  Service: Orthopedics;  Laterality: Left;    Current Outpatient Medications  Medication Sig Dispense Refill  . benazepril (LOTENSIN) 10 MG tablet Take 10 mg by mouth daily.     . Cholecalciferol (VITAMIN D3) 5000 UNITS TABS Take 5,000 Units by mouth daily.    Marland Kitchen denosumab (PROLIA) 60 MG/ML SOLN injection Inject 60 mg into the skin every 6 (six) months.    . ferrous sulfate (FERROUSUL) 325 (65 FE) MG tablet Take 1 tablet (325 mg total) 3 (three) times daily with meals by mouth.  3  . furosemide (LASIX) 40 MG tablet Take 1 tablet (40 mg total) by mouth 2 (two) times daily. 180 tablet 3  . levothyroxine (SYNTHROID, LEVOTHROID) 88 MCG tablet Take 88 mcg by mouth daily before breakfast.   2  . metFORMIN (GLUCOPHAGE) 500 MG tablet Take 500 mg by mouth daily with breakfast.      . metolazone (  ZAROXOLYN) 5 MG tablet Take 1 tablet as needed for swelling 30 tablet 0  . metoprolol succinate (TOPROL-XL) 50 MG 24 hr tablet Take 1 tablet (50 mg total) by mouth daily. 90 tablet 1  . Multiple Vitamins-Minerals (HAIR/SKIN/NAILS/BIOTIN) TABS Take 1 tablet by mouth daily.    . Multiple Vitamins-Minerals (MULTIVITAMIN PO) Take 1 tablet by mouth daily.    . nitrofurantoin, macrocrystal-monohydrate, (MACROBID) 100 MG capsule Take 100 mg by mouth 2 (two) times daily.    Vladimir Faster Glycol-Propyl Glycol (SYSTANE OP) Place 1 drop into both eyes 4 (four) times daily as needed (for burning eyes).     . potassium chloride SA (K-DUR,KLOR-CON) 20 MEQ tablet Take 1 tablet (20 mEq total) by mouth daily after supper. 90 tablet 0  . psyllium (METAMUCIL) 58.6  % powder Take 1 packet by mouth daily.    . sertraline (ZOLOFT) 100 MG tablet Take 50 mg by mouth at bedtime.     Marland Kitchen spironolactone (ALDACTONE) 25 MG tablet Take 0.5 tablets (12.5 mg total) by mouth daily. 45 tablet 3  . vitamin C (ASCORBIC ACID) 500 MG tablet Take 500 mg by mouth 2 (two) times daily.    Marland Kitchen VITAMIN E PO Take 1 tablet by mouth daily.     No current facility-administered medications for this visit.    Allergies:  Morphine and Penicillins   Social History: The patient  reports that she has never smoked. She has never used smokeless tobacco. She reports that she does not drink alcohol or use drugs.   ROS:  Please see the history of present illness. Otherwise, complete review of systems is positive for arthritic pains.  All other systems are reviewed and negative.   Physical Exam: VS:  BP 118/60   Pulse 82   Ht 5' 4"  (1.626 m)   Wt 138 lb (62.6 kg)   LMP  (LMP Unknown)   SpO2 98%   BMI 23.69 kg/m , BMI Body mass index is 23.69 kg/m.  Wt Readings from Last 3 Encounters:  11/22/17 138 lb (62.6 kg)  05/16/17 141 lb 9.6 oz (64.2 kg)  03/01/17 138 lb (62.6 kg)    General: Elderly woman, appears comfortable at rest. HEENT: Conjunctiva and lids normal, oropharynx clear. Neck: Supple, no elevated JVP or carotid bruits, no thyromegaly. Lungs: Clear to auscultation, nonlabored breathing at rest. Cardiac: Regular rate and rhythm, no S3, 2/6 systolic murmur, no pericardial rub. Abdomen: Soft, nontender, bowel sounds present. Extremities: No pitting edema, distal pulses 2+. Skin: Warm and dry. Musculoskeletal: No kyphosis. Neuropsychiatric: Alert and oriented x3, affect grossly appropriate.  ECG: I personally reviewed the tracing from 02/23/2017 which showed sinus rhythm with left bundle branch block.  Recent Labwork: 03/02/2017: BUN 35; Creatinine, Ser 1.12; Hemoglobin 9.5; Platelets 209; Potassium 4.5; Sodium 133   Other Studies Reviewed Today:  Echocardiogram  12/11/2015: Study Conclusions  - Left ventricle: The cavity size was normal. Wall thickness was increased in a pattern of mild LVH. Systolic function was normal. The estimated ejection fraction was in the range of 60% to 65%. Wall motion was normal; there were no regional wall motion abnormalities. Features are consistent with a pseudonormal left ventricular filling pattern, with concomitant abnormal relaxation and increased filling pressure (grade 2 diastolic dysfunction). - Ventricular septum: The contour showed diastolic flattening. - Aortic valve: 23 mm [place] stentless porcine valve in aortic position. Mean gradient (S): 14 mm Hg. Peak gradient (S): 24 mm Hg. VTI ratio of LVOT to aortic valve: 0.59. -  Mitral valve: Calcified annulus. Mildly thickened leaflets . There was moderate regurgitation. - Left atrium: The atrium was mildly dilated. - Right ventricle: The cavity size was mildly dilated. Systolic function was mildly reduced. - Right atrium: The atrium was mildly dilated. The Eustachian valve appeared prominant. Central venous pressure (est): 3 mm Hg. - Tricuspid valve: There was mild-moderate regurgitation. - Pulmonary arteries: PA peak pressure: 46 mm Hg (S). - Pericardium, extracardiac: There was no pericardial effusion.  Impressions:  - Mild LVH with LVEF 60-65%. Grade 2 diastolic dysfunction with increased LV filling pressure. Diastolic septal flattening suggesting increased RV volume. Mild left atrial enlargement. Moderate MAC with thickened mitral leaflets and moderate mitral regurgitation. Grossly normal bioprosthetic AVR function as outlined above with mean gradient 14 mmHg and no significant perivalvular regurgitation. Mild RV enlargement with mildly reduced contraction. Mild to moderate tricuspid regurgitation with PASP 46 mmHg. Mild right atrial enlargement, prominent Eustachian valve noted.  Assessment and  Plan:  1.  Chronic diastolic heart failure and right ventricular dysfunction.  Continue stable diuretic regimen, refill provided for as needed metolazone.  Weight is overall stable.  No clear indication for follow-up echocardiogram at this time.  2.  Aortic stenosis status post bioprosthetic AVR.  No change in cardiac murmur.  No fevers or chills.  3.  Multivessel CAD status post CABG.  No active angina symptoms.  4.  Essential hypertension, blood pressure is well controlled today.  Current medicines were reviewed with the patient today.  Disposition: Follow-up in 6 months.  Signed, Satira Sark, MD, [facility] 11/22/2017 12:12 PM    [facility] [place], [place], [place] [address] Phone: [phone]; Fax: [phone]

## 2017-11-22 ENCOUNTER — Ambulatory Visit: Payer: Medicare Other | Admitting: Cardiology

## 2017-11-22 ENCOUNTER — Encounter: Payer: Self-pay | Admitting: Cardiology

## 2017-11-22 VITALS — BP 118/60 | HR 82 | Ht 64.0 in | Wt 138.0 lb

## 2017-11-22 DIAGNOSIS — I5032 Chronic diastolic (congestive) heart failure: Secondary | ICD-10-CM | POA: Diagnosis not present

## 2017-11-22 DIAGNOSIS — I10 Essential (primary) hypertension: Secondary | ICD-10-CM | POA: Diagnosis not present

## 2017-11-22 DIAGNOSIS — I2510 Atherosclerotic heart disease of native coronary artery without angina pectoris: Secondary | ICD-10-CM

## 2017-11-22 DIAGNOSIS — Z953 Presence of xenogenic heart valve: Secondary | ICD-10-CM | POA: Diagnosis not present

## 2017-11-22 MED ORDER — METOLAZONE 5 MG PO TABS: ORAL_TABLET | ORAL | 0 refills | Status: DC

## 2017-11-22 NOTE — Patient Instructions (Signed)

## 2017-11-30 ENCOUNTER — Telehealth: Payer: Self-pay | Admitting: Cardiology

## 2017-11-30 NOTE — Telephone Encounter (Signed)
[place] can tomorrow morning  Has question about how to take her medication  metolazone (ZAROXOLYN) 5 MG tablet

## 2017-12-01 NOTE — Telephone Encounter (Signed)
   Thank you for the update. It was recommended at the time of her last office visit to continue with Lasix dosing and PRN Metolazone. Please remind the patient to continue to follow daily weights. If she is having to take Metolazone more than twice weekly, she should let us know as we may need to adjust her Lasix dosing.  Signed, Ellsworth LennoxBrittany M Drayven Marchena, PA-C 12/01/2017, 4:31 PM Pager: [phone]

## 2017-12-01 NOTE — Telephone Encounter (Signed)
Patient just wanted to let Dr. Diona BrownerMcDowell know she is having to take metolazone again due to increase in swelling

## 2017-12-02 NOTE — Telephone Encounter (Signed)
Patient notified

## 2018-02-14 ENCOUNTER — Encounter: Payer: Self-pay | Admitting: Cardiology

## 2018-02-14 ENCOUNTER — Ambulatory Visit: Payer: Medicare Other | Admitting: Cardiology

## 2018-02-14 VITALS — BP 100/50 | HR 83 | Ht 64.0 in | Wt 139.8 lb

## 2018-02-14 DIAGNOSIS — I5032 Chronic diastolic (congestive) heart failure: Secondary | ICD-10-CM

## 2018-02-14 DIAGNOSIS — Z953 Presence of xenogenic heart valve: Secondary | ICD-10-CM | POA: Diagnosis not present

## 2018-02-14 DIAGNOSIS — I2510 Atherosclerotic heart disease of native coronary artery without angina pectoris: Secondary | ICD-10-CM | POA: Diagnosis not present

## 2018-02-14 DIAGNOSIS — I10 Essential (primary) hypertension: Secondary | ICD-10-CM | POA: Diagnosis not present

## 2018-02-14 MED ORDER — FUROSEMIDE 40 MG PO TABS: 60.0000 mg | ORAL_TABLET | ORAL | 3 refills | Status: DC

## 2018-02-14 MED ORDER — BENAZEPRIL HCL 5 MG PO TABS: 5.0000 mg | ORAL_TABLET | Freq: Every day | ORAL | 2 refills | Status: DC

## 2018-02-14 MED ORDER — BENAZEPRIL HCL 10 MG PO TABS: 5.0000 mg | ORAL_TABLET | Freq: Every day | ORAL | 2 refills | Status: DC

## 2018-02-14 NOTE — Patient Instructions (Addendum)
Medication Instructions:   Your physician has recommended you make the following change in your medication:   Increase furosemide to 60 mg in the morning (3 tablets) and 40 mg in the afternoon (2 tablets).  Decrease lotensin to 5 mg by mouth daily.  Continue all other medications the same.  Labwork:  Please bring a copy of your lab work to our office after its done with Dr. Margaretmary Eddy office next week.  Testing/Procedures:  NONE  Follow-Up:  Your physician recommends that you schedule a follow-up appointment in: 6 weeks.  Any Other Special Instructions Will Be Listed Below (If Applicable).  If you need a refill on your cardiac medications before your next appointment, please call your pharmacy.

## 2018-02-14 NOTE — Progress Notes (Signed)
Cardiology Office Note  Date: 02/14/2018   ID: Erin Herman, DOB [birth year], MRN [id number]  PCP: Monico Blitz, MD  Primary Cardiologist: Rozann Lesches, MD   Chief Complaint  Patient presents with  . Edema    History of Present Illness: Erin Herman is an 84 y.o. female last seen in July. She presents today to discuss concerns about fluid gain. Her weight is not substantially higher, although she says that it fluctuates. Her legs are not edematous today, but she says that they swell. She also indicates a feeling of abdominal swelling. No chest pain or palpitations. She does have moderate diastolic dysfunction and mild RV dysfunction. She reports compliance with her diuretic regimen including as needed use of metolazone.  I personally reviewed her ECG today which shows sinus rhythm with left bundle branch block.  Past Medical History:  Diagnosis Date  . Anemia   . Anxiety   . Aortic stenosis    Bioprosthetic AVR 2006  . Arthritis    oa  . CAD (coronary artery disease), native coronary artery    Multivessel status post CABG 2006  . Cardiomyopathy ([place])   . Chronic kidney disease   . Depression   . Essential hypertension   . Family history of adverse reaction to anesthesia    daughter ponv  . GERD (gastroesophageal reflux disease)   . Headache    none recent  . History of hiatal hernia   . HOH (hard of hearing)    both ears  . LBBB (left bundle branch block)   . Mixed hyperlipidemia   . Postoperative nausea and vomiting    Morphine  . Type 2 diabetes mellitus ([place])   . UTI (lower urinary tract infection)     Past Surgical History:  Procedure Laterality Date  . ABDOMINAL HYSTERECTOMY     partial  . AORTIC VALVE REPLACEMENT  2006   Dr. Roxy Manns - 23 mm [place] stentless cadaver valve  . CORONARY ARTERY BYPASS GRAFT  2006    Dr. Roxy Manns - LIMA to LAD, SVG to circumflex  . TOTAL HIP ARTHROPLASTY Right 03/01/2017   Procedure: RIGHT TOTAL HIP ARTHROPLASTY  ANTERIOR APPROACH;  Surgeon: Paralee Cancel, MD;  Location: [facility];  Service: Orthopedics;  Laterality: Right;  70 mins  . TOTAL KNEE ARTHROPLASTY Right 2006  . TOTAL KNEE ARTHROPLASTY Left 05/20/2014   DR Noemi Chapel  . TOTAL KNEE ARTHROPLASTY Left 05/20/2014   Procedure: LEFT TOTAL KNEE ARTHROPLASTY;  Surgeon: Lorn Junes, MD;  Location: [facility];  Service: Orthopedics;  Laterality: Left;    Current Outpatient Medications  Medication Sig Dispense Refill  . Cholecalciferol (VITAMIN D3) 5000 UNITS TABS Take 5,000 Units by mouth daily.    Marland Kitchen denosumab (PROLIA) 60 MG/ML SOLN injection Inject 60 mg into the skin every 6 (six) months.    . ferrous sulfate (FERROUSUL) 325 (65 FE) MG tablet Take 1 tablet (325 mg total) 3 (three) times daily with meals by mouth.  3  . furosemide (LASIX) 40 MG tablet Take 1.5 tablets (60 mg total) by mouth every morning. & 40 mg in the afternoon 210 tablet 3  . levothyroxine (SYNTHROID, LEVOTHROID) 88 MCG tablet Take 88 mcg by mouth daily before breakfast.   2  . metFORMIN (GLUCOPHAGE) 500 MG tablet Take 500 mg by mouth daily with breakfast.      . metolazone (ZAROXOLYN) 5 MG tablet Take 1 tablet as needed for swelling 30 tablet 0  . metoprolol succinate (TOPROL-XL) 50 MG 24 hr  tablet Take 1 tablet (50 mg total) by mouth daily. 90 tablet 1  . Multiple Vitamins-Minerals (HAIR/SKIN/NAILS/BIOTIN) TABS Take 1 tablet by mouth daily.    . Multiple Vitamins-Minerals (MULTIVITAMIN PO) Take 1 tablet by mouth daily.    . nitrofurantoin (MACRODANTIN) 100 MG capsule Take 1 capsule by mouth 2 (two) times daily.    . nitrofurantoin, macrocrystal-monohydrate, (MACROBID) 100 MG capsule Take 100 mg by mouth 2 (two) times daily.    Vladimir Faster Glycol-Propyl Glycol (SYSTANE OP) Place 1 drop into both eyes 4 (four) times daily as needed (for burning eyes).     . potassium chloride SA (K-DUR,KLOR-CON) 20 MEQ tablet Take 1 tablet (20 mEq total) by mouth daily after supper. 90 tablet 0  .  psyllium (METAMUCIL) 58.6 % powder Take 1 packet by mouth daily.    . sertraline (ZOLOFT) 100 MG tablet Take 50 mg by mouth at bedtime.     Marland Kitchen spironolactone (ALDACTONE) 25 MG tablet Take 0.5 tablets (12.5 mg total) by mouth daily. 45 tablet 3  . vitamin C (ASCORBIC ACID) 500 MG tablet Take 500 mg by mouth 2 (two) times daily.    Marland Kitchen VITAMIN E PO Take 1 tablet by mouth daily.    . benazepril (LOTENSIN) 5 MG tablet Take 1 tablet (5 mg total) by mouth daily. 90 tablet 2   No current facility-administered medications for this visit.    Allergies:  Morphine and Penicillins   Social History: The patient  reports that she has never smoked. She has never used smokeless tobacco. She reports that she does not drink alcohol or use drugs.   ROS:  Please see the history of present illness. Otherwise, complete review of systems is positive for decreased hearing.  All other systems are reviewed and negative.   Physical Exam: VS:  BP (!) 100/50   Pulse 83   Ht _0  (1.626 m)   Wt 139 lb 12.8 oz (63.4 kg)   LMP  (LMP Unknown)   SpO2 95%   BMI 24.00 kg/m , BMI Body mass index is 24 kg/m.  Wt Readings from Last 3 Encounters:  02/14/18 139 lb 12.8 oz (63.4 kg)  11/22/17 138 lb (62.6 kg)  05/16/17 141 lb 9.6 oz (64.2 kg)    General: Elderly woman, appears comfortable at rest. HEENT: Conjunctiva and lids normal, oropharynx clear. Neck: Supple, no elevated JVP or carotid bruits, no thyromegaly. Lungs: Clear to auscultation, nonlabored breathing at rest. Cardiac: Regular rate and rhythm, no S3, 2/6 systolic murmur. Abdomen: Soft, nontender, bowel sounds present. Extremities: No pitting edema, distal pulses 2+. Skin: Warm and dry. Musculoskeletal: No kyphosis. Neuropsychiatric: Alert and oriented x3, affect grossly appropriate.  ECG: I personally reviewed the tracing from 02/23/2017 which showed sinus rhythm with left bundle branch block.  Recent Labwork: 03/02/2017: BUN 35; Creatinine, Ser 1.12;  Hemoglobin 9.5; Platelets 209; Potassium 4.5; Sodium 133   Other Studies Reviewed Today:  Echocardiogram  12/11/2015: Study Conclusions  - Left ventricle: The cavity size was normal. Wall thickness was   increased in a pattern of mild LVH. Systolic function was normal.   The estimated ejection fraction was in the range of 60% to 65%.   Wall motion was normal; there were no regional wall motion   abnormalities. Features are consistent with a pseudonormal left   ventricular filling pattern, with concomitant abnormal relaxation   and increased filling pressure (grade 2 diastolic dysfunction). - Ventricular septum: The contour showed diastolic flattening. - Aortic valve: 23  mm [place] stentless porcine valve in aortic   position. Mean gradient (S): 14 mm Hg. Peak gradient (S): 24 mm   Hg. VTI ratio of LVOT to aortic valve: 0.59. - Mitral valve: Calcified annulus. Mildly thickened leaflets .   There was moderate regurgitation. - Left atrium: The atrium was mildly dilated. - Right ventricle: The cavity size was mildly dilated. Systolic   function was mildly reduced. - Right atrium: The atrium was mildly dilated. The Eustachian valve   appeared prominant. Central venous pressure (est): 3 mm Hg. - Tricuspid valve: There was mild-moderate regurgitation. - Pulmonary arteries: PA peak pressure: 46 mm Hg (S). - Pericardium, extracardiac: There was no pericardial effusion.  Impressions:  - Mild LVH with LVEF 60-65%. Grade 2 diastolic dysfunction with   increased LV filling pressure. Diastolic septal flattening   suggesting increased RV volume. Mild left atrial enlargement.   Moderate MAC with thickened mitral leaflets and moderate mitral   regurgitation. Grossly normal bioprosthetic AVR function as   outlined above with mean gradient 14 mmHg and no significant   perivalvular regurgitation. Mild RV enlargement with mildly   reduced contraction. Mild to moderate tricuspid regurgitation    with PASP 46 mmHg. Mild right atrial enlargement, prominent   Eustachian valve noted.  Assessment and Plan:  1. Acute on chronic diastolic heart failure and mild RV dysfunction. She feels increased abdominal swelling, no edema of legs today. No change in weight. Increase Lasix to 60 mg morning and 40 mg afternoon, no change in aldactone or as needed metolazone. Check BMET in 1-2 weeks.  2. Aortic stenosis status post bioprosthetic AVR, no change in murmur.  3. CAD status post CABG. No angina on medical therapy.  4. History of hypertension, blood pressures low normal. Decrease Lotensin to 5 mg daily for now.  Current medicines were reviewed with the patient today.   Orders Placed This Encounter  Procedures  . EKG 12-Lead    Disposition: Follow-up in 6 weeks.  Signed, Satira Sark, MD, [facility] 02/14/2018 7:11 PM    [facility] [place], [place], [place] [address] Phone: [phone]; Fax: [phone]

## 2018-02-21 ENCOUNTER — Encounter: Payer: Self-pay | Admitting: Cardiology

## 2018-02-27 ENCOUNTER — Encounter (INDEPENDENT_AMBULATORY_CARE_PROVIDER_SITE_OTHER): Payer: Self-pay | Admitting: Internal Medicine

## 2018-02-27 ENCOUNTER — Encounter (INDEPENDENT_AMBULATORY_CARE_PROVIDER_SITE_OTHER): Payer: Self-pay | Admitting: *Deleted

## 2018-02-27 ENCOUNTER — Ambulatory Visit (INDEPENDENT_AMBULATORY_CARE_PROVIDER_SITE_OTHER): Payer: Medicare Other | Admitting: Internal Medicine

## 2018-02-27 VITALS — BP 138/64 | HR 72 | Temp 97.7°F | Ht 63.0 in | Wt 136.5 lb

## 2018-02-27 DIAGNOSIS — R1319 Other dysphagia: Secondary | ICD-10-CM

## 2018-02-27 DIAGNOSIS — R1310 Dysphagia, unspecified: Secondary | ICD-10-CM

## 2018-02-27 DIAGNOSIS — E119 Type 2 diabetes mellitus without complications: Secondary | ICD-10-CM | POA: Insufficient documentation

## 2018-02-27 NOTE — Patient Instructions (Signed)
DG esophagram.   

## 2018-02-27 NOTE — Progress Notes (Signed)
Subjective:    Patient ID: Erin Herman, female    DOB: December 19, 1927, 84 y.o.   MRN: [id number]  HPI Referred by Dr. Sherryll Burger for dysphagia. She says her throat has been sore. Some of her pills are sticking. She also has a terrible cough. She has had symptoms (cough) for a couple of months.  Saw Dr. Sherryll Burger last week and given Protonix, Carafate, and Azithromycin. She tells me foods do not lodge in her esophagus.  Since starting the Antibiotic she feels better. She also received a ? shot in Dr. Margaretmary Eddy office. Her appetite is good. Has lost about 5-6 pounds over the last few months.  She has a BM daily. No melena or BRRB.   Hx of cardiomyopathy, CHF.  CABG years ago. Aortic valve replacement years ago. Retired from [employer].  Review of Systems Past Medical History:  Diagnosis Date  . Anemia   . Anxiety   . Aortic stenosis    Bioprosthetic AVR 2006  . Arthritis    oa  . CAD (coronary artery disease), native coronary artery    Multivessel status post CABG 2006  . Cardiomyopathy (HCC)   . Chronic kidney disease   . Depression   . Diabetes (HCC) 02/27/2018  . Essential hypertension   . Family history of adverse reaction to anesthesia    daughter ponv  . GERD (gastroesophageal reflux disease)   . Headache    none recent  . History of hiatal hernia   . HOH (hard of hearing)    both ears  . LBBB (left bundle branch block)   . Mixed hyperlipidemia   . Postoperative nausea and vomiting    Morphine  . Type 2 diabetes mellitus (HCC)   . UTI (lower urinary tract infection)     Past Surgical History:  Procedure Laterality Date  . ABDOMINAL HYSTERECTOMY     partial  . AORTIC VALVE REPLACEMENT  2006   Dr. Cornelius Moras - 23 mm [place] stentless cadaver valve  . CORONARY ARTERY BYPASS GRAFT  2006    Dr. Cornelius Moras - LIMA to LAD, SVG to circumflex  . TOTAL HIP ARTHROPLASTY Right 03/01/2017   Procedure: RIGHT TOTAL HIP ARTHROPLASTY ANTERIOR APPROACH;  Surgeon: Durene Romans, MD;  Location: [facility];   Service: Orthopedics;  Laterality: Right;  70 mins  . TOTAL KNEE ARTHROPLASTY Right 2006  . TOTAL KNEE ARTHROPLASTY Left 05/20/2014   DR Thurston Hole  . TOTAL KNEE ARTHROPLASTY Left 05/20/2014   Procedure: LEFT TOTAL KNEE ARTHROPLASTY;  Surgeon: Nilda Simmer, MD;  [facility];  Service: Orthopedics;  Laterality: Left;    Allergies  Allergen Reactions  . Morphine Nausea And Vomiting  . Penicillins Swelling and Other (See Comments)    Severe arm swelling and redness  . Reclast [Zoledronic Acid]     "nausea and vomiting. Could not get out of bed.    Current Outpatient Medications on File Prior to Visit  Medication Sig Dispense Refill  . benazepril (LOTENSIN) 5 MG tablet Take 1 tablet (5 mg total) by mouth daily. 90 tablet 2  . denosumab (PROLIA) 60 MG/ML SOLN injection Inject 60 mg into the skin every 6 (six) months.    . Dexamethasone (ZEMA-PAK 6 DAY PO) Take by mouth.    . ezetimibe (ZETIA) 10 MG tablet Take 10 mg by mouth daily.    . ferrous sulfate (FERROUSUL) 325 (65 FE) MG tablet Take 1 tablet (325 mg total) 3 (three) times daily with meals by mouth.  3  .  furosemide (LASIX) 40 MG tablet Take 1.5 tablets (60 mg total) by mouth every morning. & 40 mg in the afternoon 210 tablet 3  . levothyroxine (SYNTHROID, LEVOTHROID) 88 MCG tablet Take 88 mcg by mouth daily before breakfast.   2  . metFORMIN (GLUCOPHAGE) 500 MG tablet Take 500 mg by mouth daily with breakfast.      . metolazone (ZAROXOLYN) 5 MG tablet Take 1 tablet as needed for swelling 30 tablet 0  . metoprolol succinate (TOPROL-XL) 50 MG 24 hr tablet Take 1 tablet (50 mg total) by mouth daily. 90 tablet 1  . Multiple Vitamins-Minerals (HAIR/SKIN/NAILS/BIOTIN) TABS Take 1 tablet by mouth daily.    . Multiple Vitamins-Minerals (MULTIVITAMIN PO) Take 1 tablet by mouth daily.    . nitrofurantoin, macrocrystal-monohydrate, (MACROBID) 100 MG capsule Take 100 mg by mouth 2 (two) times daily.    . pantoprazole (PROTONIX) 40 MG tablet  Take 40 mg by mouth daily.    Bertram Gala Glycol-Propyl Glycol (SYSTANE OP) Place 1 drop into both eyes 4 (four) times daily as needed (for burning eyes).     . potassium chloride SA (K-DUR,KLOR-CON) 20 MEQ tablet Take 1 tablet (20 mEq total) by mouth daily after supper. 90 tablet 0  . psyllium (METAMUCIL) 58.6 % powder Take 1 packet by mouth daily.    . sertraline (ZOLOFT) 100 MG tablet Take 50 mg by mouth at bedtime.     Marland Kitchen spironolactone (ALDACTONE) 25 MG tablet Take 0.5 tablets (12.5 mg total) by mouth daily. 45 tablet 3  . vitamin C (ASCORBIC ACID) 500 MG tablet Take 500 mg by mouth 2 (two) times daily.    Marland Kitchen VITAMIN E PO Take 1 tablet by mouth daily.    . Cholecalciferol (VITAMIN D3) 5000 UNITS TABS Take 5,000 Units by mouth daily.     No current facility-administered medications on file prior to visit.         Objective:   Physical Exam Blood pressure 138/64, pulse 72, temperature 97.7 F (36.5 C), height 5\' 3"  (1.6 m), weight 136 lb 8 oz (61.9 kg). Alert and oriented. Skin warm and dry. Oral mucosa is moist.   . Sclera anicteric, conjunctivae is pink. Thyroid not enlarged. No cervical lymphadenopathy. Lungs clear. Heart regular rate and rhythm.  Abdomen is soft. Bowel sounds are positive. No hepatomegaly. No abdominal masses felt. No tenderness.  No edema to lower extremities.           Assessment & Plan:  Dysphagia to pills. Also now has a cough.  Will get an DG esophagram.

## 2018-03-02 ENCOUNTER — Ambulatory Visit (HOSPITAL_COMMUNITY): Payer: Medicare Other

## 2018-03-03 ENCOUNTER — Ambulatory Visit (HOSPITAL_COMMUNITY)
Admission: RE | Admit: 2018-03-03 | Discharge: 2018-03-03 | Disposition: A | Discharge status: Home / Self Care | Payer: Medicare Other | Source: Ambulatory Visit | Attending: Internal Medicine | Admitting: Internal Medicine

## 2018-03-03 DIAGNOSIS — K224 Dyskinesia of esophagus: Secondary | ICD-10-CM | POA: Insufficient documentation

## 2018-03-03 DIAGNOSIS — R1310 Dysphagia, unspecified: Secondary | ICD-10-CM | POA: Insufficient documentation

## 2018-03-03 DIAGNOSIS — R1319 Other dysphagia: Secondary | ICD-10-CM

## 2018-03-09 ENCOUNTER — Telehealth (INDEPENDENT_AMBULATORY_CARE_PROVIDER_SITE_OTHER): Payer: Self-pay | Admitting: Internal Medicine

## 2018-03-09 ENCOUNTER — Other Ambulatory Visit (INDEPENDENT_AMBULATORY_CARE_PROVIDER_SITE_OTHER): Payer: Self-pay | Admitting: Internal Medicine

## 2018-03-09 DIAGNOSIS — R1310 Dysphagia, unspecified: Secondary | ICD-10-CM

## 2018-03-09 NOTE — Progress Notes (Signed)
Dg: swallowing

## 2018-03-09 NOTE — Telephone Encounter (Signed)
Referral placed, they will contact patient.  °

## 2018-03-09 NOTE — Telephone Encounter (Signed)
Erin Herman, Needs Modified Barium Swallowing with speech pathology

## 2018-03-10 ENCOUNTER — Other Ambulatory Visit (HOSPITAL_COMMUNITY): Payer: Self-pay | Admitting: Specialist

## 2018-03-10 DIAGNOSIS — R1319 Other dysphagia: Secondary | ICD-10-CM

## 2018-03-13 ENCOUNTER — Telehealth (INDEPENDENT_AMBULATORY_CARE_PROVIDER_SITE_OTHER): Payer: Self-pay | Admitting: Internal Medicine

## 2018-03-13 NOTE — Telephone Encounter (Signed)
No answer

## 2018-03-20 ENCOUNTER — Encounter (HOSPITAL_COMMUNITY): Payer: Self-pay | Admitting: Speech Pathology

## 2018-03-20 ENCOUNTER — Ambulatory Visit (HOSPITAL_COMMUNITY)
Admission: RE | Admit: 2018-03-20 | Discharge: 2018-03-20 | Disposition: A | Discharge status: Home / Self Care | Payer: Medicare Other | Source: Ambulatory Visit | Attending: Internal Medicine | Admitting: Internal Medicine

## 2018-03-20 ENCOUNTER — Other Ambulatory Visit: Payer: Self-pay

## 2018-03-20 ENCOUNTER — Ambulatory Visit (HOSPITAL_COMMUNITY): Payer: Medicare Other | Attending: Internal Medicine | Admitting: Speech Pathology

## 2018-03-20 DIAGNOSIS — R1319 Other dysphagia: Secondary | ICD-10-CM | POA: Insufficient documentation

## 2018-03-20 NOTE — Therapy (Signed)
[facility] [address] [place], [place], [address] Phone: [phone]   Fax:  [phone]  Modified Barium Swallow  Patient Details  Name: Erin Herman MRN: [id number] Date of Birth: [birth year] No data recorded  Encounter Date: 03/20/2018  End of Session - 03/20/18 1626    Visit Number  1    Number of Visits  1    Authorization Type  [employer] Medicare    SLP Start Time  1320    SLP Stop Time   1350    SLP Time Calculation (min)  30 min    Activity Tolerance  Patient tolerated treatment well       Past Medical History:  Diagnosis Date  . Anemia   . Anxiety   . Aortic stenosis    Bioprosthetic AVR 2006  . Arthritis    oa  . CAD (coronary artery disease), native coronary artery    Multivessel status post CABG 2006  . Cardiomyopathy (HCC)   . Chronic kidney disease   . Depression   . Diabetes (HCC) 02/27/2018  . Essential hypertension   . Family history of adverse reaction to anesthesia    daughter ponv  . GERD (gastroesophageal reflux disease)   . Headache    none recent  . History of hiatal hernia   . HOH (hard of hearing)    both ears  . LBBB (left bundle branch block)   . Mixed hyperlipidemia   . Postoperative nausea and vomiting    Morphine  . Type 2 diabetes mellitus (HCC)   . UTI (lower urinary tract infection)     Past Surgical History:  Procedure Laterality Date  . ABDOMINAL HYSTERECTOMY     partial  . AORTIC VALVE REPLACEMENT  2006   Dr. Cornelius Moras - 23 mm [place] stentless cadaver valve  . CORONARY ARTERY BYPASS GRAFT  2006    Dr. Cornelius Moras - LIMA to LAD, SVG to circumflex  . TOTAL HIP ARTHROPLASTY Right 03/01/2017   Procedure: RIGHT TOTAL HIP ARTHROPLASTY ANTERIOR APPROACH;  Surgeon: Durene Romans, MD;  [facility];  Service: Orthopedics;  Laterality: Right;  70 mins  . TOTAL KNEE ARTHROPLASTY Right 2006  . TOTAL KNEE ARTHROPLASTY Left 05/20/2014   DR Thurston Hole  . TOTAL KNEE ARTHROPLASTY Left 05/20/2014    Procedure: LEFT TOTAL KNEE ARTHROPLASTY;  Surgeon: Nilda Simmer, MD;  Location: [facility];  Service: Orthopedics;  Laterality: Left;    There were no vitals filed for this visit.  Subjective Assessment - 03/20/18 1504    Subjective  "I cough all the time."    Patient is accompained by:  Family member   daughter   Currently in Pain?  No/denies        General - 03/20/18 1615      General Information   Date of Onset  03/03/18    HPI Erin Herman is an 84 yo female who was referred by Dorene Ar for MBSS due to aspiration on recent barium swallow and Pt c/o coughing throughout the day. Pt had a barium swallow on 03/03/18 which showed: Moderate age-related esophageal dysmotility. Laryngeal penetration and silent aspiration of a small amount of contrast material below the level of the vocal cords. Mild vallecular and piriform sinus residuals as well as early spillover of contrast from the oral cavity into the hypopharynx.     Type of Study  MBS-Modified Barium Swallow Study    Diet Prior to this Study  Regular;Thin liquids  Temperature Spikes Noted  No    Respiratory Status  Room air    History of Recent Intubation  No    Behavior/Cognition  Alert;Cooperative;Pleasant mood    Oral Cavity Assessment  Within Functional Limits    Oral Care Completed by SLP  No    Oral Cavity - Dentition  Adequate natural dentition    Self-Feeding Abilities  Able to feed self    Patient Positioning  Upright in chair    Baseline Vocal Quality  Normal;Hoarse    Volitional Cough  Strong    Volitional Swallow  Able to elicit    Anatomy  Within functional limits    Pharyngeal Secretions  Not observed secondary MBS         Oral Preparation/Oral Phase - 03/20/18 1619      Oral Preparation/Oral Phase   Oral Phase  Impaired      Oral - Solids   Oral - Regular  Delayed A-P transit    Oral - Pill  Piecemeal swallowing      Electrical stimulation - Oral Phase   Was Electrical Stimulation Used   No       Pharyngeal Phase - 03/20/18 1621      Pharyngeal Phase   Pharyngeal Phase  Impaired      Pharyngeal - Thin   Pharyngeal- Thin Teaspoon  Penetration/Aspiration during swallow    Pharyngeal  Material does not enter airway;Material enters airway, remains ABOVE vocal cords then ejected out    Pharyngeal- Thin Cup  Swallow initiation at vallecula;Within functional limits    Pharyngeal- Thin Straw  Swallow initiation at vallecula;Swallow initiation at pyriform sinus;Penetration/Aspiration during swallow    Pharyngeal  Material does not enter airway;Material enters airway, remains ABOVE vocal cords and not ejected out   removed after throat clear and repeat swallow     Pharyngeal - Solids   Pharyngeal- Puree  Within functional limits;Delayed swallow initiation    Pharyngeal- Regular  Within functional limits;Delayed swallow initiation-vallecula    Pharyngeal- Pill  Delayed swallow initiation      Electrical Stimulation - Pharyngeal Phase   Was Electrical Stimulation Used  No       Cricopharyngeal Phase - 03/20/18 1624      Cervical Esophageal Phase   Cervical Esophageal Phase  Impaired      Cervical Esophageal Phase - Comment   Other Esophageal Phase Observations  Delayed emptying in esophagus and confirmed dysmotility by radiologist: Diffuse thoracic esophageal dysmotility was noted with slow clearance of contrast from the thoracic esophagus with patient sitting upright.      Plan - 03/20/18 1626    Clinical Impression Statement Pt presents with min oropharyngeal dysphagia characterized by mildly prolonged oral phase with solids and reduced bolus cohesiveness with barium tablet and pill resulting in Pt swallowing the liquid first and then transferring pill to valleculae. The pill was transiently delayed in the valleculae, but cleared with a repeat swallow. Swallow trigger was generally at the level of the valleculae across textures and consistencies. Pt with flash  penetration during teaspoon presentation of thin, which was immediately cleared. During straw sips, Pt penetrated trace amount of thin on the second swallow with head slightly tipped back. Swallow physiology is WNL for age. She does report xerostomia and trace residuals of barium/puree noted on soft palate, which triggered throat clearing.   SLP reviewed the MBSS imaging with Pt and daughter and provided education regarding increasing moisture in foods, avoiding use of straws, adhere to reflux precautions  due to dysmotility noted with esophageal sweep, replace constant throat clearing with a dry swallow and sipping on water, and remain upright for at least 30 minutes after meals. No further SLP services indicated at this time.    Potential to Achieve Goals  Good    Consulted and Agree with Plan of Care  Patient;Family member/caregiver    Family Member Consulted  daughter       Patient will benefit from skilled therapeutic intervention in order to improve the following deficits and impairments:   Other dysphagia    Recommendations/Treatment - 03/20/18 1625      Swallow Evaluation Recommendations   SLP Diet Recommendations  Age appropriate regular;Thin    Liquid Administration via  Cup    Medication Administration  Whole meds with liquid    Supervision  Patient able to self feed    Postural Changes  Seated upright at 90 degrees;Remain upright for at least 30 minutes after feeds/meals       Prognosis - 03/20/18 1625      Prognosis   Prognosis for Safe Diet Advancement  Good      Individuals Consulted   Consulted and Agree with Results and Recommendations  Patient;Family member/caregiver    Report Sent to   Referring physician       Problem List Patient Active Problem List   Diagnosis Date Noted  . Diabetes (HCC) 02/27/2018  . S/P right THA, AA 03/01/2017  . Postoperative anemia due to acute blood loss 05/22/2014  . DJD (degenerative joint disease) of knee 05/20/2014  . Primary  localized osteoarthritis of left knee 04/30/2014  . Preoperative cardiovascular examination 04/04/2014  . Chronic diastolic heart failure (HCC) 07/03/2012  . CAROTID ARTERY DISEASE 12/09/2009  . Essential hypertension, benign 08/05/2008  . CAD, NATIVE VESSEL 08/05/2008  . Secondary cardiomyopathy (HCC) 08/05/2008  . Aortic valve disorder 08/05/2008  . Hyperlipidemia 08/03/2008   Thank you,  Havery MorosDabney Porter, CCC-SLP [phone]  [facility] PaORTER,DABNEY 03/20/2018, 4:29 PM  [facility] [address] [place], [place], [address] Phone: [phone]   Fax:  [phone]  Name: Erin Herman MRN: [id number] Date of Birth: Nov 08, 1927

## 2018-03-30 ENCOUNTER — Telehealth (INDEPENDENT_AMBULATORY_CARE_PROVIDER_SITE_OTHER): Payer: Self-pay | Admitting: Internal Medicine

## 2018-03-30 NOTE — Telephone Encounter (Signed)
Patient would like for you to call her at ph# [phone]

## 2018-03-30 NOTE — Telephone Encounter (Signed)
err

## 2018-03-30 NOTE — Telephone Encounter (Signed)
I have spoken with patient and reveiwed the recommendations of Speech pathology.

## 2018-04-06 NOTE — Progress Notes (Signed)
Cardiology Office Note  Date: 04/07/2018   ID: Erin Herman, DOB May 14, 1927, MRN [id number]  PCP: Monico Blitz, MD  Primary Cardiologist: Rozann Lesches, MD   Chief Complaint  Patient presents with  . Cardiac follow-up    History of Present Illness: Erin Herman is an 84 y.o. female last seen in October.  She is here today with her daughter for a follow-up visit.  Overall reports a better sense of fluid gain since Lasix was increased.  She still has had trouble with recurring intermittent cough.  I did receive communication from a home health encounter with question regarding whether she could come off of ACE inhibitor as a potential etiology of her cough.  We did talk about stopping Lotensin today which I think is reasonable.  With blood pressure in current range would wait to replace this medication unless needed.   Past Medical History:  Diagnosis Date  . Anemia   . Anxiety   . Aortic stenosis    Bioprosthetic AVR 2006  . Arthritis    oa  . CAD (coronary artery disease), native coronary artery    Multivessel status post CABG 2006  . Cardiomyopathy ([place])   . Chronic kidney disease   . Depression   . Diabetes ([place]) 02/27/2018  . Essential hypertension   . Family history of adverse reaction to anesthesia    daughter ponv  . GERD (gastroesophageal reflux disease)   . Headache    none recent  . History of hiatal hernia   . HOH (hard of hearing)    both ears  . LBBB (left bundle branch block)   . Mixed hyperlipidemia   . Postoperative nausea and vomiting    Morphine  . Type 2 diabetes mellitus ([place])   . UTI (lower urinary tract infection)     Past Surgical History:  Procedure Laterality Date  . ABDOMINAL HYSTERECTOMY     partial  . AORTIC VALVE REPLACEMENT  2006   Dr. Roxy Manns - 23 mm [place] stentless cadaver valve  . CORONARY ARTERY BYPASS GRAFT  2006    Dr. Roxy Manns - LIMA to LAD, SVG to circumflex  . TOTAL HIP ARTHROPLASTY Right 03/01/2017   Procedure: RIGHT TOTAL HIP ARTHROPLASTY ANTERIOR APPROACH;  Surgeon: Paralee Cancel, MD;  Location: [facility];  Service: Orthopedics;  Laterality: Right;  70 mins  . TOTAL KNEE ARTHROPLASTY Right 2006  . TOTAL KNEE ARTHROPLASTY Left 05/20/2014   DR Noemi Chapel  . TOTAL KNEE ARTHROPLASTY Left 05/20/2014   Procedure: LEFT TOTAL KNEE ARTHROPLASTY;  Surgeon: Lorn Junes, MD;  Location: [facility];  Service: Orthopedics;  Laterality: Left;    Current Outpatient Medications  Medication Sig Dispense Refill  . Cholecalciferol (VITAMIN D3) 5000 UNITS TABS Take 5,000 Units by mouth daily.    Marland Kitchen denosumab (PROLIA) 60 MG/ML SOLN injection Inject 60 mg into the skin every 6 (six) months.    . Dexamethasone (ZEMA-PAK 6 DAY PO) Take by mouth.    . ezetimibe (ZETIA) 10 MG tablet Take 10 mg by mouth daily.    . ferrous sulfate (FERROUSUL) 325 (65 FE) MG tablet Take 1 tablet (325 mg total) 3 (three) times daily with meals by mouth.  3  . furosemide (LASIX) 40 MG tablet Take 1.5 tablets (60 mg total) by mouth every morning. & 40 mg in the afternoon 210 tablet 3  . levothyroxine (SYNTHROID, LEVOTHROID) 88 MCG tablet Take 88 mcg by mouth daily before breakfast.   2  . metFORMIN (GLUCOPHAGE) 500 MG  tablet Take 500 mg by mouth daily with breakfast.      . metolazone (ZAROXOLYN) 5 MG tablet Take 1 tablet as needed for swelling 30 tablet 0  . metoprolol succinate (TOPROL-XL) 50 MG 24 hr tablet Take 1 tablet (50 mg total) by mouth daily. 90 tablet 1  . Multiple Vitamins-Minerals (HAIR/SKIN/NAILS/BIOTIN) TABS Take 1 tablet by mouth daily.    . Multiple Vitamins-Minerals (MULTIVITAMIN PO) Take 1 tablet by mouth daily.    . nitrofurantoin, macrocrystal-monohydrate, (MACROBID) 100 MG capsule Take 100 mg by mouth 2 (two) times daily.    . pantoprazole (PROTONIX) 40 MG tablet Take 40 mg by mouth daily.    Vladimir Faster Glycol-Propyl Glycol (SYSTANE OP) Place 1 drop into both eyes 4 (four) times daily as needed (for burning eyes).     .  potassium chloride SA (K-DUR,KLOR-CON) 20 MEQ tablet Take 1 tablet (20 mEq total) by mouth daily after supper. 90 tablet 0  . psyllium (METAMUCIL) 58.6 % powder Take 1 packet by mouth daily.    . sertraline (ZOLOFT) 100 MG tablet Take 50 mg by mouth at bedtime.     Marland Kitchen spironolactone (ALDACTONE) 25 MG tablet Take 0.5 tablets (12.5 mg total) by mouth daily. 45 tablet 3  . sucralfate (CARAFATE) 1 g tablet Take 1 g by mouth 4 (four) times daily -  with meals and at bedtime.    . vitamin C (ASCORBIC ACID) 500 MG tablet Take 500 mg by mouth 2 (two) times daily.    Marland Kitchen VITAMIN E PO Take 1 tablet by mouth daily.     No current facility-administered medications for this visit.    Allergies:  Morphine; Penicillins; and Reclast [zoledronic acid]   Social History: The patient  reports that she has never smoked. She has never used smokeless tobacco. She reports that she does not drink alcohol or use drugs.   ROS:  Please see the history of present illness. Otherwise, complete review of systems is positive for hearing loss.  All other systems are reviewed and negative.   Physical Exam: VS:  BP (!) 92/46   Pulse 78   Ht _0  (1.626 m)   Wt 136 lb 6.4 oz (61.9 kg)   LMP  (LMP Unknown)   SpO2 95%   BMI 23.41 kg/m , BMI Body mass index is 23.41 kg/m.  Wt Readings from Last 3 Encounters:  04/07/18 136 lb 6.4 oz (61.9 kg)  02/27/18 136 lb 8 oz (61.9 kg)  02/14/18 139 lb 12.8 oz (63.4 kg)    General: Elderly woman, appears comfortable at rest. HEENT: Conjunctiva and lids normal, oropharynx clear. Neck: Supple, no elevated JVP or carotid bruits, no thyromegaly. Lungs: Clear to auscultation, nonlabored breathing at rest. Cardiac: Regular rate and rhythm, no S3, 2/6 systolic murmur. Abdomen: Soft, nontender, bowel sounds present. Extremities: No pitting edema, distal pulses 2+.  ECG: I personally reviewed the tracing from 02/14/2018 which showed sinus rhythm with left bundle branch block.  Recent  Labwork:  October 2019: BUN 52, creatinine 1.12, potassium 3.6, AST 34, ALT 18, cholesterol 261, triglycerides 163, HDL 44, LDL 184, hemoglobin 11.3, platelets 300, TSH 1.26  Other Studies Reviewed Today:  Echocardiogram  12/11/2015: Study Conclusions  - Left ventricle: The cavity size was normal. Wall thickness was increased in a pattern of mild LVH. Systolic function was normal. The estimated ejection fraction was in the range of 60% to 65%. Wall motion was normal; there were no regional wall motion abnormalities. Features are  consistent with a pseudonormal left ventricular filling pattern, with concomitant abnormal relaxation and increased filling pressure (grade 2 diastolic dysfunction). - Ventricular septum: The contour showed diastolic flattening. - Aortic valve: 23 mm [place] stentless porcine valve in aortic position. Mean gradient (S): 14 mm Hg. Peak gradient (S): 24 mm Hg. VTI ratio of LVOT to aortic valve: 0.59. - Mitral valve: Calcified annulus. Mildly thickened leaflets . There was moderate regurgitation. - Left atrium: The atrium was mildly dilated. - Right ventricle: The cavity size was mildly dilated. Systolic function was mildly reduced. - Right atrium: The atrium was mildly dilated. The Eustachian valve appeared prominant. Central venous pressure (est): 3 mm Hg. - Tricuspid valve: There was mild-moderate regurgitation. - Pulmonary arteries: PA peak pressure: 46 mm Hg (S). - Pericardium, extracardiac: There was no pericardial effusion.  Impressions:  - Mild LVH with LVEF 60-65%. Grade 2 diastolic dysfunction with increased LV filling pressure. Diastolic septal flattening suggesting increased RV volume. Mild left atrial enlargement. Moderate MAC with thickened mitral leaflets and moderate mitral regurgitation. Grossly normal bioprosthetic AVR function as outlined above with mean gradient 14 mmHg and no significant perivalvular  regurgitation. Mild RV enlargement with mildly reduced contraction. Mild to moderate tricuspid regurgitation with PASP 46 mmHg. Mild right atrial enlargement, prominent Eustachian valve noted.  Assessment and Plan:  1.  Chronic diastolic heart failure.  Weight is stable and she is tolerating current dose of Lasix.  I did review her interval lab work.  2.  Chronic intermittent cough.  Plan to stop Lotensin in case this is a side effect.  Would watch blood pressure prior to adding a replacement such as ARB.  Blood pressure low normal today.  3.  CAD status post CABG, no active angina symptoms at this time.  Current medicines were reviewed with the patient today.  Disposition: Follow-up in 6 months.  Signed, Satira Sark, MD, [facility] 04/07/2018 2:53 PM    [facility] [place], [place], [place] [address] Phone: [phone]; Fax: [phone]

## 2018-04-07 ENCOUNTER — Ambulatory Visit: Payer: Medicare Other | Admitting: Cardiology

## 2018-04-07 ENCOUNTER — Encounter: Payer: Self-pay | Admitting: Cardiology

## 2018-04-07 VITALS — BP 92/46 | HR 78 | Ht 64.0 in | Wt 136.4 lb

## 2018-04-07 DIAGNOSIS — I5032 Chronic diastolic (congestive) heart failure: Secondary | ICD-10-CM

## 2018-04-07 DIAGNOSIS — R05 Cough: Secondary | ICD-10-CM

## 2018-04-07 DIAGNOSIS — I2510 Atherosclerotic heart disease of native coronary artery without angina pectoris: Secondary | ICD-10-CM

## 2018-04-07 DIAGNOSIS — T464X5A Adverse effect of angiotensin-converting-enzyme inhibitors, initial encounter: Secondary | ICD-10-CM | POA: Diagnosis not present

## 2018-04-07 NOTE — Patient Instructions (Addendum)
Medication Instructions:   Your physician has recommended you make the following change in your medication:   Stop benazepril (lotensin)  Continue all other medications the same.  Labwork:  NONE  Testing/Procedures:  NONE  Follow-Up:  Your physician recommends that you schedule a follow-up appointment in: 6 months. You will receive a reminder letter in the mail in about 4 months reminding you to call and schedule your appointment. If you don't receive this letter, please contact our office.  Any Other Special Instructions Will Be Listed Below (If Applicable). Please monitor your blood pressures at home.  If you need a refill on your cardiac medications before your next appointment, please call your pharmacy.

## 2018-04-26 HISTORY — PX: CARDIAC CATHETERIZATION: SHX172

## 2018-05-10 ENCOUNTER — Ambulatory Visit (INDEPENDENT_AMBULATORY_CARE_PROVIDER_SITE_OTHER): Payer: Medicare Other | Admitting: Internal Medicine

## 2018-05-10 ENCOUNTER — Encounter (INDEPENDENT_AMBULATORY_CARE_PROVIDER_SITE_OTHER): Payer: Self-pay | Admitting: Internal Medicine

## 2018-05-10 VITALS — BP 105/61 | HR 86 | Temp 97.9°F | Ht 64.0 in | Wt 136.6 lb

## 2018-05-10 DIAGNOSIS — R1310 Dysphagia, unspecified: Secondary | ICD-10-CM | POA: Diagnosis not present

## 2018-05-10 DIAGNOSIS — R1319 Other dysphagia: Secondary | ICD-10-CM

## 2018-05-10 HISTORY — DX: Dysphagia, unspecified: R13.10

## 2018-05-10 NOTE — Progress Notes (Signed)
Subjective:    Patient ID: Erin Herman, female    DOB: Oct 30, 1927, 85 y.o.   MRN: [id number]  HPI Here today for f/u. Hx of chronic cough. Off the Protonix. Taking Zantac now. Has not coughed since being in office today.  The benazpril was d/c by Dr. Diona Browner.  She tells me she feels better. She verbalizes understanding of Speech Pathology's instructions. She is staying active as much as she can. She cleans her own house.    Review of Systems Past Medical History:  Diagnosis Date  . Anemia   . Anxiety   . Aortic stenosis    Bioprosthetic AVR 2006  . Arthritis    oa  . CAD (coronary artery disease), native coronary artery    Multivessel status post CABG 2006  . Cardiomyopathy (HCC)   . Chronic kidney disease   . Depression   . Diabetes (HCC) 02/27/2018  . Essential hypertension   . Family history of adverse reaction to anesthesia    daughter ponv  . GERD (gastroesophageal reflux disease)   . Headache    none recent  . History of hiatal hernia   . HOH (hard of hearing)    both ears  . LBBB (left bundle branch block)   . Mixed hyperlipidemia   . Postoperative nausea and vomiting    Morphine  . Type 2 diabetes mellitus (HCC)   . UTI (lower urinary tract infection)     Past Surgical History:  Procedure Laterality Date  . ABDOMINAL HYSTERECTOMY     partial  . AORTIC VALVE REPLACEMENT  2006   Dr. Cornelius Moras - 23 mm [place] stentless cadaver valve  . CORONARY ARTERY BYPASS GRAFT  2006    Dr. Cornelius Moras - LIMA to LAD, SVG to circumflex  . TOTAL HIP ARTHROPLASTY Right 03/01/2017   Procedure: RIGHT TOTAL HIP ARTHROPLASTY ANTERIOR APPROACH;  Surgeon: Durene Romans, MD;  Location: [facility];  Service: Orthopedics;  Laterality: Right;  70 mins  . TOTAL KNEE ARTHROPLASTY Right 2006  . TOTAL KNEE ARTHROPLASTY Left 05/20/2014   DR Thurston Hole  . TOTAL KNEE ARTHROPLASTY Left 05/20/2014   Procedure: LEFT TOTAL KNEE ARTHROPLASTY;  Surgeon: Nilda Simmer, MD;  Location: [facility];  Service:  Orthopedics;  Laterality: Left;    Allergies  Allergen Reactions  . Morphine Nausea And Vomiting  . Penicillins Swelling and Other (See Comments)    Severe arm swelling and redness  . Reclast [Zoledronic Acid]     "nausea and vomiting. Could not get out of bed.    Current Outpatient Medications on File Prior to Visit  Medication Sig Dispense Refill  . Cholecalciferol (VITAMIN D3) 5000 UNITS TABS Take 5,000 Units by mouth daily.    Marland Kitchen denosumab (PROLIA) 60 MG/ML SOLN injection Inject 60 mg into the skin every 6 (six) months.    . ezetimibe (ZETIA) 10 MG tablet Take 10 mg by mouth daily.    . ferrous sulfate (FERROUSUL) 325 (65 FE) MG tablet Take 1 tablet (325 mg total) 3 (three) times daily with meals by mouth.  3  . furosemide (LASIX) 40 MG tablet Take 1.5 tablets (60 mg total) by mouth every morning. & 40 mg in the afternoon (Patient taking differently: Take 80 mg by mouth every morning. 40mg  x 2 a day) 210 tablet 3  . levothyroxine (SYNTHROID, LEVOTHROID) 88 MCG tablet Take 88 mcg by mouth daily before breakfast.   2  . metFORMIN (GLUCOPHAGE) 500 MG tablet Take 500 mg by mouth daily with  breakfast.      . metolazone (ZAROXOLYN) 5 MG tablet Take 1 tablet as needed for swelling 30 tablet 0  . metoprolol succinate (TOPROL-XL) 50 MG 24 hr tablet Take 1 tablet (50 mg total) by mouth daily. 90 tablet 1  . Multiple Vitamins-Minerals (HAIR/SKIN/NAILS/BIOTIN) TABS Take 1 tablet by mouth daily.    . Multiple Vitamins-Minerals (MULTIVITAMIN PO) Take 1 tablet by mouth daily.    . nitrofurantoin, macrocrystal-monohydrate, (MACROBID) 100 MG capsule Take 100 mg by mouth 2 (two) times daily.    Bertram Gala. Polyethyl Glycol-Propyl Glycol (SYSTANE OP) Place 1 drop into both eyes 4 (four) times daily as needed (for burning eyes).     . potassium chloride SA (K-DUR,KLOR-CON) 20 MEQ tablet Take 1 tablet (20 mEq total) by mouth daily after supper. 90 tablet 0  . psyllium (METAMUCIL) 58.6 % powder Take 1 packet by mouth  daily.    . sertraline (ZOLOFT) 100 MG tablet Take 50 mg by mouth at bedtime.     Marland Kitchen. spironolactone (ALDACTONE) 25 MG tablet Take 0.5 tablets (12.5 mg total) by mouth daily. 45 tablet 3  . vitamin C (ASCORBIC ACID) 500 MG tablet Take 500 mg by mouth 2 (two) times daily.    Marland Kitchen. VITAMIN E PO Take 1 tablet by mouth daily.     No current facility-administered medications on file prior to visit.         Objective:   Physical Exam Blood pressure 105/61, pulse 86, temperature 97.9 F (36.6 C), height 5\' 4"  (1.626 m), weight 136 lb 9.6 oz (62 kg). Alert and oriented. Skin warm and dry. Oral mucosa is moist.   . Sclera anicteric, conjunctivae is pink. Thyroid not enlarged. No cervical lymphadenopathy. Lungs clear. Heart regular rate and rhythm.  Abdomen is soft. Bowel sounds are positive. No hepatomegaly. No abdominal masses felt. No tenderness.  No edema to lower extremities          Assessment & Plan:  Chronic cough. She will try Prilosec OTC. (They have taken Zantac off the market). OV as needed or 1 year.

## 2018-05-10 NOTE — Patient Instructions (Signed)
OV in 1 year or as needed

## 2018-06-09 ENCOUNTER — Ambulatory Visit: Payer: Medicare Other | Admitting: Cardiology

## 2018-06-14 ENCOUNTER — Other Ambulatory Visit: Payer: Self-pay | Admitting: Cardiology

## 2018-06-27 ENCOUNTER — Telehealth: Payer: Self-pay | Admitting: *Deleted

## 2018-06-28 ENCOUNTER — Telehealth: Payer: Self-pay | Admitting: *Deleted

## 2018-06-28 DIAGNOSIS — I359 Nonrheumatic aortic valve disorder, unspecified: Secondary | ICD-10-CM

## 2018-06-28 NOTE — Telephone Encounter (Signed)
Pt returned call regarding clearance that was faxed to [facility] 2/24 - per note pt need echo prior to provider completing clearance, pt agreeable - will place orders and forward to schedulers

## 2018-06-29 NOTE — Telephone Encounter (Signed)
Patient returned call and was notified by [facility]. See phone note encounter following this one.

## 2018-07-12 ENCOUNTER — Ambulatory Visit (INDEPENDENT_AMBULATORY_CARE_PROVIDER_SITE_OTHER): Payer: Medicare Other

## 2018-07-12 ENCOUNTER — Other Ambulatory Visit: Payer: Self-pay

## 2018-07-12 DIAGNOSIS — I359 Nonrheumatic aortic valve disorder, unspecified: Secondary | ICD-10-CM

## 2018-07-13 ENCOUNTER — Other Ambulatory Visit: Payer: Medicare Other

## 2018-07-13 ENCOUNTER — Telehealth: Payer: Self-pay | Admitting: *Deleted

## 2018-07-13 NOTE — Telephone Encounter (Signed)
Patient informed and verbalized understanding of plan. Copy sent to PCP 

## 2018-07-13 NOTE — Telephone Encounter (Signed)
-----   Message from Ellsworth Lennox, [place] sent at 07/13/2018 11:37 AM EDT ----- Covering for Dr. Diona Browner - Please let the patient know the pumping function of her heart has declined slightly since her prior study. Normal EF is 55-65% (hers was 60-65% by prior echo in 2017) and is now at 45-50%. RV function was mildly reduced which is similar to her prior study. Bioprosthetic aortic valve is functioning normally. It appears by review of notes this was ordered for cardiac clearance in regards to an upcoming urologic procedure and this had already been signed by Dr. Diona Browner for her to proceed. If she has not experienced any new dyspnea or chest pain, I do not think her echocardiogram results would delay her procedure but will leave for Dr. Diona Browner to review as well upon his return on Monday to make the final decision regarding this. Please forward a copy of results to Kirstie Peri, MD.

## 2018-07-14 ENCOUNTER — Telehealth: Payer: Self-pay | Admitting: Cardiology

## 2018-07-14 DIAGNOSIS — I5032 Chronic diastolic (congestive) heart failure: Secondary | ICD-10-CM

## 2018-07-14 MED ORDER — METOPROLOL SUCCINATE ER 50 MG PO TB24: 50.0000 mg | ORAL_TABLET | Freq: Every day | ORAL | 3 refills | Status: DC

## 2018-07-14 NOTE — Telephone Encounter (Signed)
°*  STAT* If patient is at the pharmacy, call can be transferred to refill team.   1. Which medications need to be refilled?  metoprolol succinate (TOPROL-XL) 50 MG 24 hr tablet    2. Which pharmacy/location (including street and city if local pharmacy) is medication to be sent to? Mail order pharmacy   [facility] [phone] - [address], [place] - [address] AT [address]  3. Do they need a 30 day or 90 day supply?

## 2018-07-14 NOTE — Telephone Encounter (Signed)
Done

## 2018-07-17 ENCOUNTER — Telehealth: Payer: Self-pay | Admitting: Cardiology

## 2018-07-17 NOTE — Telephone Encounter (Signed)
Preoperative cardiac clearance form received on patient from [facility] in [place].  Patient being considered for elective urological surgery under general anesthesia in April or May.  She has a history of aortic stenosis status post bioprosthetic AVR in 2006 as well as multivessel CAD status post CABG.  He was last seen in December 2019 at which point she was clinically stable.  Follow-up echocardiogram revealed LVEF 45 to 50% range which does represent a change from previous assessment in 2017 at which point it was 60 to 65%.  Mild RV dysfunction was otherwise stable and bioprosthetic AVR was functioning normally.  RCRI cardiac risk calculator is class III at 6.6% risk of major adverse cardiac event.  I do not anticipate further cardiac testing prior to her elective surgery.  She may proceed at overall intermediate risk.  She is not on any antiplatelet or anticoagulant medications at this time.

## 2018-10-03 IMAGING — DX DG PORTABLE PELVIS
1 series · 1 of 1 positions shown · non-contrast
Comparison: None.

CLINICAL DATA: Status post total hip replacement on the right

EXAM:
PORTABLE PELVIS 1-2 VIEWS

[pelvis ap]
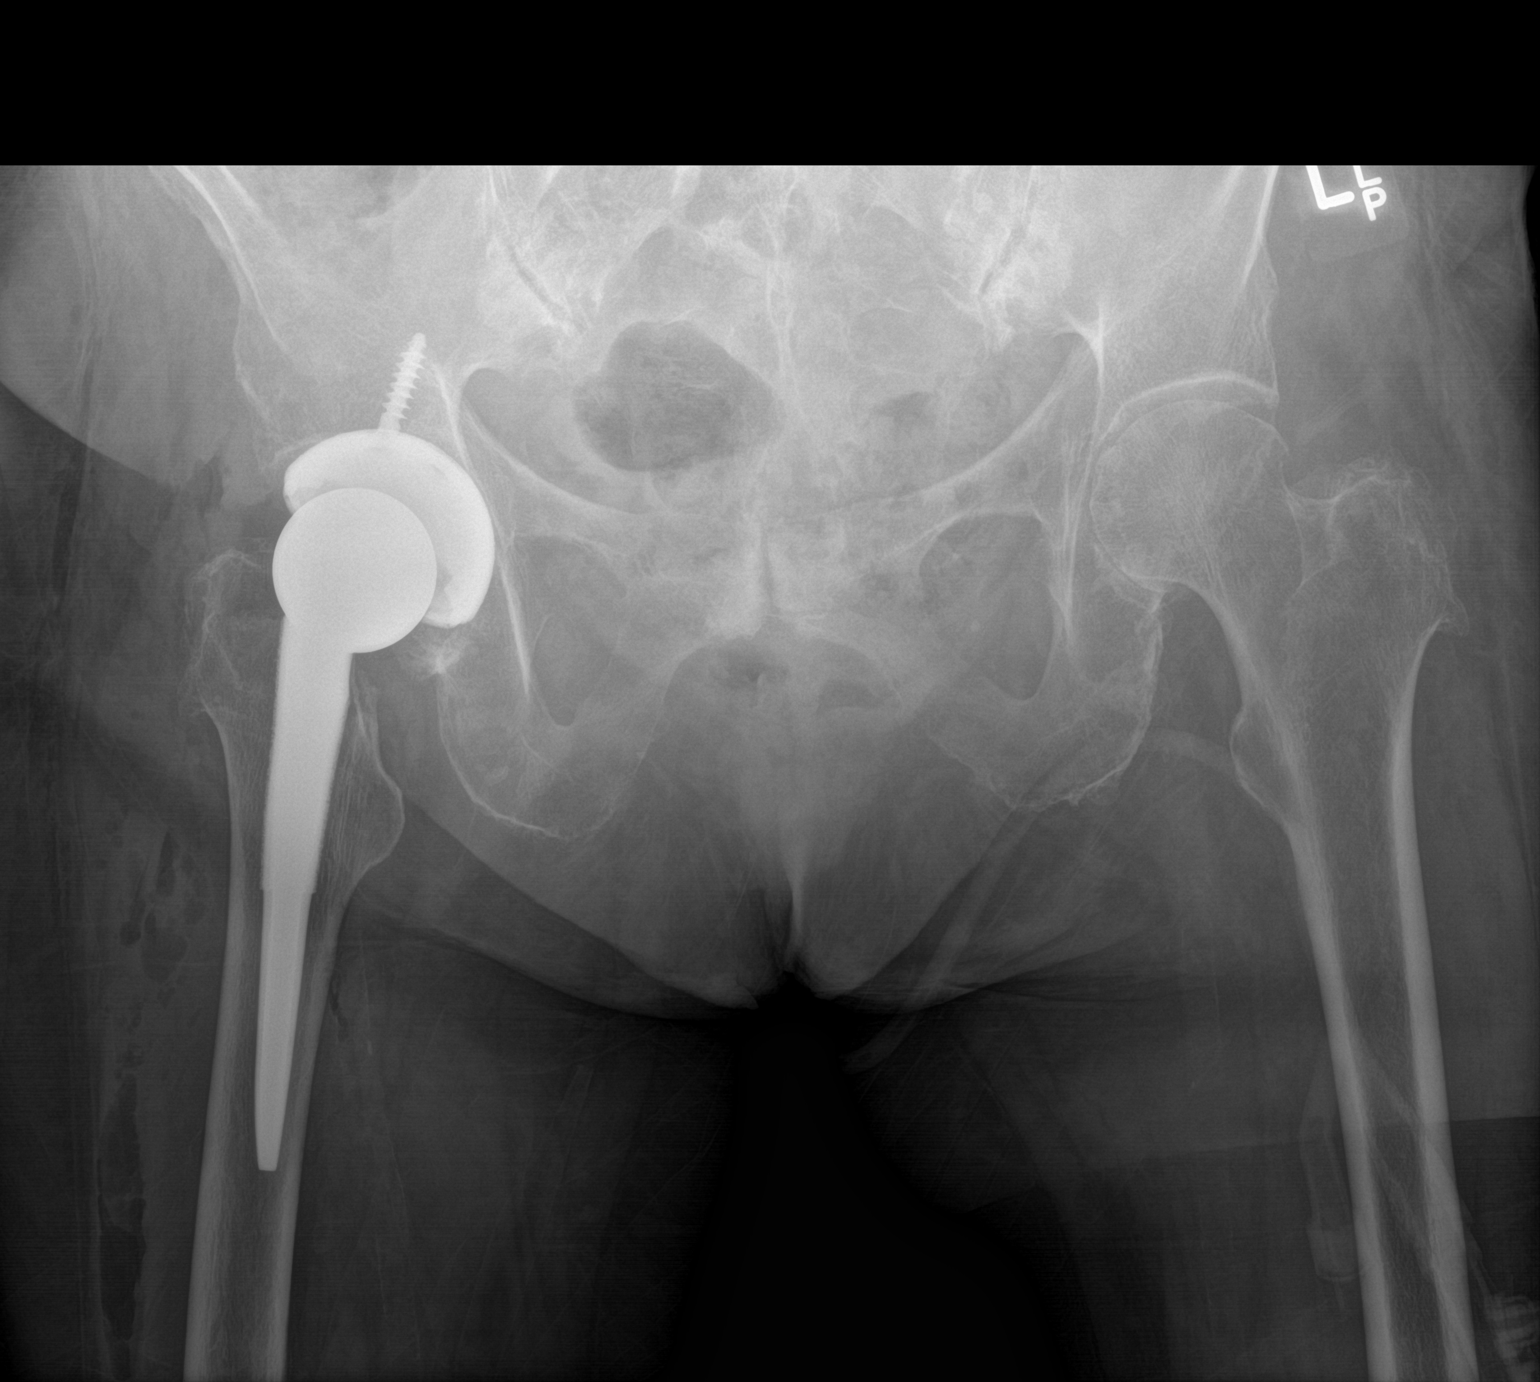

[1 of 1 positions shown; findings below may reference images not displayed]

FINDINGS: Frontal pelvis obtained. There is a total hip replacement right with
prosthetic components appearing well-seated on frontal view. No
fracture or dislocation evident. There is moderate narrowing of the
left hip joint. There is mild degenerative change in each sacroiliac
joint. Air in the soft tissues on the right is an expected
postoperative finding.
IMPRESSION: Total hip replacement right with prosthetic components appearing
well-seated on frontal view. No fracture or dislocation. Moderate
narrowing left hip joint. Mild osteoarthritic change noted in each
sacroiliac joint, essentially symmetric.

## 2018-10-11 ENCOUNTER — Ambulatory Visit: Payer: Medicare Other | Admitting: Cardiology

## 2018-11-04 ENCOUNTER — Inpatient Hospital Stay (HOSPITAL_COMMUNITY)
Admission: AD | Admit: 2018-11-04 | Discharge: 2018-11-07 | DRG: 247 | Disposition: A | Discharge status: Home / Self Care | Payer: Medicare Other | Source: Other Acute Inpatient Hospital | Attending: Internal Medicine | Admitting: Internal Medicine

## 2018-11-04 DIAGNOSIS — E039 Hypothyroidism, unspecified: Secondary | ICD-10-CM | POA: Diagnosis present

## 2018-11-04 DIAGNOSIS — I255 Ischemic cardiomyopathy: Secondary | ICD-10-CM | POA: Diagnosis present

## 2018-11-04 DIAGNOSIS — Z951 Presence of aortocoronary bypass graft: Secondary | ICD-10-CM

## 2018-11-04 DIAGNOSIS — I214 Non-ST elevation (NSTEMI) myocardial infarction: Principal | ICD-10-CM | POA: Diagnosis present

## 2018-11-04 DIAGNOSIS — Z20828 Contact with and (suspected) exposure to other viral communicable diseases: Secondary | ICD-10-CM | POA: Diagnosis present

## 2018-11-04 DIAGNOSIS — I110 Hypertensive heart disease with heart failure: Secondary | ICD-10-CM | POA: Diagnosis present

## 2018-11-04 DIAGNOSIS — Z96641 Presence of right artificial hip joint: Secondary | ICD-10-CM | POA: Diagnosis present

## 2018-11-04 DIAGNOSIS — Z79890 Hormone replacement therapy (postmenopausal): Secondary | ICD-10-CM

## 2018-11-04 DIAGNOSIS — I440 Atrioventricular block, first degree: Secondary | ICD-10-CM | POA: Diagnosis present

## 2018-11-04 DIAGNOSIS — E782 Mixed hyperlipidemia: Secondary | ICD-10-CM | POA: Diagnosis present

## 2018-11-04 DIAGNOSIS — I447 Left bundle-branch block, unspecified: Secondary | ICD-10-CM | POA: Diagnosis present

## 2018-11-04 DIAGNOSIS — I10 Essential (primary) hypertension: Secondary | ICD-10-CM | POA: Diagnosis not present

## 2018-11-04 DIAGNOSIS — Z90710 Acquired absence of both cervix and uterus: Secondary | ICD-10-CM

## 2018-11-04 DIAGNOSIS — F419 Anxiety disorder, unspecified: Secondary | ICD-10-CM | POA: Diagnosis present

## 2018-11-04 DIAGNOSIS — I2510 Atherosclerotic heart disease of native coronary artery without angina pectoris: Secondary | ICD-10-CM | POA: Diagnosis not present

## 2018-11-04 DIAGNOSIS — Z880 Allergy status to penicillin: Secondary | ICD-10-CM

## 2018-11-04 DIAGNOSIS — K219 Gastro-esophageal reflux disease without esophagitis: Secondary | ICD-10-CM | POA: Diagnosis present

## 2018-11-04 DIAGNOSIS — Z96653 Presence of artificial knee joint, bilateral: Secondary | ICD-10-CM | POA: Diagnosis present

## 2018-11-04 DIAGNOSIS — Z833 Family history of diabetes mellitus: Secondary | ICD-10-CM

## 2018-11-04 DIAGNOSIS — Z955 Presence of coronary angioplasty implant and graft: Secondary | ICD-10-CM

## 2018-11-04 DIAGNOSIS — E876 Hypokalemia: Secondary | ICD-10-CM | POA: Diagnosis present

## 2018-11-04 DIAGNOSIS — Z888 Allergy status to other drugs, medicaments and biological substances status: Secondary | ICD-10-CM | POA: Diagnosis not present

## 2018-11-04 DIAGNOSIS — I25119 Atherosclerotic heart disease of native coronary artery with unspecified angina pectoris: Secondary | ICD-10-CM | POA: Diagnosis present

## 2018-11-04 DIAGNOSIS — Z952 Presence of prosthetic heart valve: Secondary | ICD-10-CM

## 2018-11-04 DIAGNOSIS — Z7984 Long term (current) use of oral hypoglycemic drugs: Secondary | ICD-10-CM

## 2018-11-04 DIAGNOSIS — H9190 Unspecified hearing loss, unspecified ear: Secondary | ICD-10-CM | POA: Diagnosis present

## 2018-11-04 DIAGNOSIS — K449 Diaphragmatic hernia without obstruction or gangrene: Secondary | ICD-10-CM | POA: Diagnosis present

## 2018-11-04 DIAGNOSIS — E119 Type 2 diabetes mellitus without complications: Secondary | ICD-10-CM | POA: Diagnosis present

## 2018-11-04 DIAGNOSIS — I5032 Chronic diastolic (congestive) heart failure: Secondary | ICD-10-CM | POA: Diagnosis not present

## 2018-11-04 DIAGNOSIS — Z885 Allergy status to narcotic agent status: Secondary | ICD-10-CM

## 2018-11-04 DIAGNOSIS — I5042 Chronic combined systolic (congestive) and diastolic (congestive) heart failure: Secondary | ICD-10-CM | POA: Diagnosis present

## 2018-11-04 DIAGNOSIS — E785 Hyperlipidemia, unspecified: Secondary | ICD-10-CM | POA: Diagnosis present

## 2018-11-04 DIAGNOSIS — I340 Nonrheumatic mitral (valve) insufficiency: Secondary | ICD-10-CM | POA: Diagnosis not present

## 2018-11-04 DIAGNOSIS — Z79899 Other long term (current) drug therapy: Secondary | ICD-10-CM

## 2018-11-04 LAB — LIPID PANEL
Cholesterol: 210 mg/dL — ABNORMAL HIGH (ref 0–200)
HDL: 45 mg/dL (ref >40)
LDL Cholesterol: 142 mg/dL — ABNORMAL HIGH (ref 0–99)
Total CHOL/HDL Ratio: 4.7 RATIO
Triglycerides: 117 mg/dL (ref <150)
VLDL: 23 mg/dL (ref 0–40)

## 2018-11-04 LAB — TROPONIN I (HIGH SENSITIVITY): Troponin I (High Sensitivity): 518 ng/L (ref <18)

## 2018-11-04 LAB — SARS CORONAVIRUS 2 BY RT PCR (HOSPITAL ORDER, PERFORMED IN ~~LOC~~ HOSPITAL LAB): SARS Coronavirus 2: NEGATIVE

## 2018-11-04 MED ORDER — ACETAMINOPHEN 325 MG PO TABS: 650.0000 mg | ORAL_TABLET | ORAL | Status: DC | PRN

## 2018-11-04 MED ORDER — HEPARIN BOLUS VIA INFUSION
3500.0000 [IU] | Freq: Once | INTRAVENOUS | Status: AC
  Administered 2018-11-04: 3500 [IU] via INTRAVENOUS
  Filled 2018-11-04: qty 3500

## 2018-11-04 MED ORDER — HEPARIN (PORCINE) 25000 UT/250ML-% IV SOLN
850.0000 [IU]/h | INTRAVENOUS | Status: DC
  Administered 2018-11-04 – 2018-11-06 (×2): 750 [IU]/h via INTRAVENOUS
  Filled 2018-11-04 (×2): qty 250

## 2018-11-04 MED ORDER — ONDANSETRON HCL 4 MG/2ML IJ SOLN: 4.0000 mg | Freq: Four times a day (QID) | INTRAMUSCULAR | Status: DC | PRN

## 2018-11-04 MED ORDER — SODIUM CHLORIDE 0.9 % IV SOLN
INTRAVENOUS | Status: DC
  Administered 2018-11-04: 22:00:00 via INTRAVENOUS

## 2018-11-04 MED ORDER — NITROGLYCERIN IN D5W 200-5 MCG/ML-% IV SOLN
0.0000 ug/min | INTRAVENOUS | Status: DC
  Administered 2018-11-04 (×2): 10 ug/min via INTRAVENOUS
  Filled 2018-11-04: qty 250

## 2018-11-04 NOTE — Progress Notes (Signed)
ANTICOAGULATION CONSULT NOTE - Initial Consult  Pharmacy Consult for Heparin Indication: chest pain/ACS  Allergies  Allergen Reactions  . Morphine Nausea And Vomiting  . Penicillins Swelling and Other (See Comments)    Severe arm swelling and redness  . Reclast [Zoledronic Acid]     "nausea and vomiting. Could not get out of bed.    Patient Measurements:    Estimated Weight (05/10/18): 62 kg IBW: 54.7 kg Heparin Dosing Weight: 62 kg   Vital Signs:    Labs: No results for input(s): HGB, HCT, PLT, APTT, LABPROT, INR, HEPARINUNFRC, HEPRLOWMOCWT, CREATININE, CKTOTAL, CKMB, TROPONINIHS in the last 72 hours.  CrCl cannot be calculated (Patient's most recent lab result is older than the maximum 21 days allowed.).   Medical History: Past Medical History:  Diagnosis Date  . Anemia   . Anxiety   . Aortic stenosis    Bioprosthetic AVR 2006  . Arthritis    oa  . CAD (coronary artery disease), native coronary artery    Multivessel status post CABG 2006  . Cardiomyopathy ([place])   . Chronic kidney disease   . Depression   . Diabetes ([place]) 02/27/2018  . Dysphagia 05/10/2018  . Essential hypertension   . Family history of adverse reaction to anesthesia    daughter ponv  . GERD (gastroesophageal reflux disease)   . Headache    none recent  . History of hiatal hernia   . HOH (hard of hearing)    both ears  . LBBB (left bundle branch block)   . Mixed hyperlipidemia   . Postoperative nausea and vomiting    Morphine  . Type 2 diabetes mellitus ([place])   . UTI (lower urinary tract infection)      Assessment: 85 yo f presenting with ACS/NSTEMI. Pharmacy consulted to dose heparin. No anticoagulation noted PTA. Labs pending.  Goal of Therapy:  Heparin level 0.3-0.7 units/ml Monitor platelets by anticoagulation protocol: Yes   Plan:  Give 3500 units bolus x 1 Start heparin infusion at 750 units/hr Check anti-Xa level in 8 hours and daily while on heparin Continue to monitor  H&H and platelets  Richardine Service, PharmD PGY1 Pharmacy Resident Direct Line: [phone] 11/04/2018,10:03 PM

## 2018-11-04 NOTE — H&P (Addendum)
History and Physical   Erin Herman ZOX:[id number] DOB: [birth year] DOA: 11/04/2018  Referring MD/NP/PA: From [facility]  PCP: Kirstie Peri, MD   Outpatient Specialists: Dr. Harlin Heys, cardiology  Patient coming from: [facility]  Chief Complaint: Chest pain  HPI: Erin Herman is a 85 y.o. female with medical history significant of coronary artery disease, ischemic cardiomyopathy with EF of 45 to 50%, diabetes hypertension, hyperlipidemia, aortic valve disease with prosthetic valve in place,, diastolic dysfunction and diabetes who was transferred from [facility] after admission with chest pain.  Pain was rated as 7 out of 10.  More pressure.  There was diffuse pain around the chest with some radiation in different directions.  Has EKG with new findings of left bundle branch block and positive troponins to 0.07 with the cardiac point as less than 0.01.  Patient has had exertional chest pain just for 2 days.  Patient initiated on nitroglycerin as well as heparin drip and transferred over after discussion with cardiology..  ED Course: Work-up at outside hospital showed initial troponin of 0.02 then 0.04 and then 0.07.  She has a proBNP of 640, potassium 2.8 and EKG showing first-degree AV block and left bundle branch block which appears to be chronic.  Review of Systems: As per HPI otherwise 10 point review of systems negative.    Past Medical History:  Diagnosis Date  . Anemia   . Anxiety   . Aortic stenosis    Bioprosthetic AVR 2006  . Arthritis    oa  . CAD (coronary artery disease), native coronary artery    Multivessel status post CABG 2006  . Cardiomyopathy (HCC)   . Chronic kidney disease   . Depression   . Diabetes (HCC) 02/27/2018  . Dysphagia 05/10/2018  . Essential hypertension   . Family history of adverse reaction to anesthesia    daughter ponv  . GERD (gastroesophageal reflux disease)   . Headache    none recent  . History of  hiatal hernia   . HOH (hard of hearing)    both ears  . LBBB (left bundle branch block)   . Mixed hyperlipidemia   . Postoperative nausea and vomiting    Morphine  . Type 2 diabetes mellitus (HCC)   . UTI (lower urinary tract infection)     Past Surgical History:  Procedure Laterality Date  . ABDOMINAL HYSTERECTOMY     partial  . AORTIC VALVE REPLACEMENT  2006   Dr. Cornelius Moras - 23 mm [place] stentless cadaver valve  . CORONARY ARTERY BYPASS GRAFT  2006    Dr. Cornelius Moras - LIMA to LAD, SVG to circumflex  . TOTAL HIP ARTHROPLASTY Right 03/01/2017   Procedure: RIGHT TOTAL HIP ARTHROPLASTY ANTERIOR APPROACH;  Surgeon: Durene Romans, MD;  Location: [facility] ORS;  Service: Orthopedics;  Laterality: Right;  70 mins  . TOTAL KNEE ARTHROPLASTY Right 2006  . TOTAL KNEE ARTHROPLASTY Left 05/20/2014   DR Thurston Hole  . TOTAL KNEE ARTHROPLASTY Left 05/20/2014   Procedure: LEFT TOTAL KNEE ARTHROPLASTY;  Surgeon: Nilda Simmer, MD;  Location: [facility] OR;  Service: Orthopedics;  Laterality: Left;     reports that she has never smoked. She has never used smokeless tobacco. She reports that she does not drink alcohol or use drugs.  Allergies  Allergen Reactions  . Morphine Nausea And Vomiting  . Penicillins Swelling and Other (See Comments)    Severe arm swelling and redness  . Reclast [Zoledronic Acid]     "nausea and vomiting.  Could not get out of bed.    Family History  Problem Relation Age of Onset  . Heart Problems Mother   . Diabetes Father   . Cirrhosis Father   . Diabetes Other      Prior to Admission medications   Medication Sig Start Date End Date Taking? Authorizing Provider  Cholecalciferol (VITAMIN D3) 5000 UNITS TABS Take 5,000 Units by mouth daily.    [provider]  denosumab (PROLIA) 60 MG/ML SOLN injection Inject 60 mg into the skin every 6 (six) months. 05/03/17   [provider]  ezetimibe (ZETIA) 10 MG tablet Take 10 mg by mouth daily.    [provider]   ferrous sulfate (FERROUSUL) 325 (65 FE) MG tablet Take 1 tablet (325 mg total) 3 (three) times daily with meals by mouth. 03/01/17   Lanney GinsBabish, Matthew, PA-C  furosemide (LASIX) 40 MG tablet Take 1.5 tablets (60 mg total) by mouth every morning. & 40 mg in the afternoon Patient taking differently: Take 80 mg by mouth every morning. 40mg  x 2 a day 02/14/18   Jonelle SidleMcDowell, Samuel G, MD  levothyroxine (SYNTHROID, LEVOTHROID) 88 MCG tablet Take 88 mcg by mouth daily before breakfast.  02/27/14   [provider]  metFORMIN (GLUCOPHAGE) 500 MG tablet Take 500 mg by mouth daily with breakfast.      [provider]  metolazone (ZAROXOLYN) 5 MG tablet Take 1 tablet as needed for swelling 11/22/17   Jonelle SidleMcDowell, Samuel G, MD  metoprolol succinate (TOPROL-XL) 50 MG 24 hr tablet Take 1 tablet (50 mg total) by mouth daily. 07/14/18   Jonelle SidleMcDowell, Samuel G, MD  Multiple Vitamins-Minerals (HAIR/SKIN/NAILS/BIOTIN) TABS Take 1 tablet by mouth daily.    [provider]  Multiple Vitamins-Minerals (MULTIVITAMIN PO) Take 1 tablet by mouth daily.    [provider]  nitrofurantoin, macrocrystal-monohydrate, (MACROBID) 100 MG capsule Take 100 mg by mouth 2 (two) times daily.    [provider]  Polyethyl Glycol-Propyl Glycol (SYSTANE OP) Place 1 drop into both eyes 4 (four) times daily as needed (for burning eyes).     [provider]  potassium chloride SA (K-DUR,KLOR-CON) 20 MEQ tablet Take 1 tablet (20 mEq total) by mouth daily after supper. 01/26/17   Jonelle SidleMcDowell, Samuel G, MD  psyllium (METAMUCIL) 58.6 % powder Take 1 packet by mouth daily.    [provider]  ranitidine (ZANTAC) 150 MG capsule Take 150 mg by mouth 2 (two) times daily.    [provider]  sertraline (ZOLOFT) 100 MG tablet Take 50 mg by mouth at bedtime.     [provider]  spironolactone (ALDACTONE) 25 MG tablet Take 0.5 tablets (12.5 mg total) by mouth daily. 07/06/16   Laqueta LindenKoneswaran, Suresh  A, MD  vitamin C (ASCORBIC ACID) 500 MG tablet Take 500 mg by mouth 2 (two) times daily.    [provider]  VITAMIN E PO Take 1 tablet by mouth daily.    [provider]    Physical Exam: Vitals:   11/05/18 0353 11/05/18 0408 11/05/18 0423 11/05/18 0438  BP: 121/68 110/66 102/63 (!) 88/56  Pulse: (!) 101 98 98 100  Resp: (!) 8 10 11  (!) 22  Temp:      TempSrc:      SpO2: 96% 93% 94% 92%  Weight:      Height:          Constitutional: NAD, mildly uncomfortable due to pain Vitals:   11/05/18 0353 11/05/18 0408 11/05/18 0423  11/05/18 0438  BP: 121/68 110/66 102/63 (!) 88/56  Pulse: (!) 101 98 98 100  Resp: (!) 8 10 11  (!) 22  Temp:      TempSrc:      SpO2: 96% 93% 94% 92%  Weight:      Height:       Eyes: PERRL, lids and conjunctivae normal ENMT: Mucous membranes are moist. Posterior pharynx clear of any exudate or lesions.Normal dentition.  Neck: normal, supple, no masses, no thyromegaly Respiratory: clear to auscultation bilaterally, no wheezing, mild bilateral crackles. Normal respiratory effort. No accessory muscle use.  Cardiovascular: Regular rate and rhythm, grade 3 out of 5 systolic murmur/ rubs / gallops. No extremity edema. 2+ pedal pulses. No carotid bruits.  Abdomen: no tenderness, no masses palpated. No hepatosplenomegaly. Bowel sounds positive.  Musculoskeletal: no clubbing / cyanosis. No joint deformity upper and lower extremities. Good ROM, no contractures. Normal muscle tone.  Skin: no rashes, lesions, ulcers. No induration Neurologic: CN 2-12 grossly intact. Sensation intact, DTR normal. Strength 5/5 in all 4.  Psychiatric: Normal judgment and insight. Alert and oriented x 3. Normal mood.     Labs on Admission: I have personally reviewed following labs and imaging studies  CBC: Recent Labs  Lab 11/05/18 0419  WBC 9.4  HGB 11.9*  HCT 34.7*  MCV 92.8  PLT 161   Basic Metabolic Panel: No results for input(s): NA, K, CL, CO2,  GLUCOSE, BUN, CREATININE, CALCIUM, MG, PHOS in the last 168 hours. GFR: CrCl cannot be calculated (Patient's most recent lab result is older than the maximum 21 days allowed.). Liver Function Tests: No results for input(s): AST, ALT, ALKPHOS, BILITOT, PROT, ALBUMIN in the last 168 hours. No results for input(s): LIPASE, AMYLASE in the last 168 hours. No results for input(s): AMMONIA in the last 168 hours. Coagulation Profile: No results for input(s): INR, PROTIME in the last 168 hours. Cardiac Enzymes: No results for input(s): CKTOTAL, CKMB, CKMBINDEX, TROPONINI in the last 168 hours. BNP (last 3 results) No results for input(s): PROBNP in the last 8760 hours. HbA1C: Recent Labs    11/05/18 0421  HGBA1C 7.1*   CBG: Recent Labs  Lab 11/05/18 0016  GLUCAP 126*   Lipid Profile: Recent Labs    11/04/18 2142  CHOL 210*  HDL 45  LDLCALC 142*  TRIG 117  CHOLHDL 4.7   Thyroid Function Tests: No results for input(s): TSH, T4TOTAL, FREET4, T3FREE, THYROIDAB in the last 72 hours. Anemia Panel: No results for input(s): VITAMINB12, FOLATE, FERRITIN, TIBC, IRON, RETICCTPCT in the last 72 hours. Urine analysis:    Component Value Date/Time   COLORURINE YELLOW 05/20/2014 0802   APPEARANCEUR CLEAR 05/20/2014 0802   LABSPEC 1.009 05/20/2014 0802   PHURINE 7.5 05/20/2014 0802   GLUCOSEU NEGATIVE 05/20/2014 0802   HGBUR NEGATIVE 05/20/2014 0802   BILIRUBINUR NEGATIVE 05/20/2014 0802   KETONESUR NEGATIVE 05/20/2014 0802   PROTEINUR NEGATIVE 05/20/2014 0802   UROBILINOGEN 0.2 05/20/2014 0802   NITRITE NEGATIVE 05/20/2014 0802   LEUKOCYTESUR NEGATIVE 05/20/2014 0802   Sepsis Labs: @LABRCNTIP (procalcitonin:4,lacticidven:4) ) Recent Results (from the past 240 hour(s))  SARS Coronavirus 2 (CEPHEID - Performed in [place] hospital lab), Hosp Order     Status: None   Collection Time: 11/04/18  9:29 PM   Specimen: Nasopharyngeal Swab  Result Value Ref Range Status   SARS  Coronavirus 2 NEGATIVE NEGATIVE Final    Comment: (NOTE) If result is NEGATIVE SARS-CoV-2 target nucleic acids are NOT DETECTED. The SARS-CoV-2  RNA is generally detectable in upper and lower  respiratory specimens during the acute phase of infection. The lowest  concentration of SARS-CoV-2 viral copies this assay can detect is 250  copies / mL. A negative result does not preclude SARS-CoV-2 infection  and should not be used as the sole basis for treatment or other  patient management decisions.  A negative result may occur with  improper specimen collection / handling, submission of specimen other  than nasopharyngeal swab, presence of viral mutation(s) within the  areas targeted by this assay, and inadequate number of viral copies  (<250 copies / mL). A negative result must be combined with clinical  observations, patient history, and epidemiological information. If result is POSITIVE SARS-CoV-2 target nucleic acids are DETECTED. The SARS-CoV-2 RNA is generally detectable in upper and lower  respiratory specimens dur ing the acute phase of infection.  Positive  results are indicative of active infection with SARS-CoV-2.  Clinical  correlation with patient history and other diagnostic information is  necessary to determine patient infection status.  Positive results do  not rule out bacterial infection or co-infection with other viruses. If result is PRESUMPTIVE POSTIVE SARS-CoV-2 nucleic acids MAY BE PRESENT.   A presumptive positive result was obtained on the submitted specimen  and confirmed on repeat testing.  While 2019 novel coronavirus  (SARS-CoV-2) nucleic acids may be present in the submitted sample  additional confirmatory testing may be necessary for epidemiological  and / or clinical management purposes  to differentiate between  SARS-CoV-2 and other Sarbecovirus currently known to infect humans.  If clinically indicated additional testing with an alternate test   methodology [phone]([id number]) is advised. The SARS-CoV-2 RNA is generally  detectable in upper and lower respiratory sp ecimens during the acute  phase of infection. The expected result is Negative. Fact Sheet for Patients:  BoilerBrush.com.cyhttps://www.fda.gov/media/136312/download Fact Sheet for Healthcare Providers: https://pope.com/https://www.fda.gov/media/136313/download This test is not yet approved or cleared by the [place] FDA and has been authorized for detection and/or diagnosis of SARS-CoV-2 by FDA under an Emergency Use Authorization (EUA).  This EUA will remain in effect (meaning this test can be used) for the duration of the COVID-19 declaration under Section 564(b)(1) of the Act, 21 U.S.C. section 360bbb-3(b)(1), unless the authorization is terminated or revoked sooner. Performed at [facility], [address] [place], [place] [address]      Radiological Exams on Admission: No results found.  EKG: Independently reviewed.  Normal sinus rhythm with first-degree AV block and left bundle branch block  Assessment/Plan Principal Problem:   NSTEMI (non-ST elevated myocardial infarction) (HCC) Active Problems:   Hyperlipidemia   Essential hypertension, benign   CAD, NATIVE VESSEL   Chronic diastolic heart failure (HCC)     #1 non-ST elevation MI: Patient initiated on IV heparin and nitroglycerin.  Cardiology consulted.  Will follow the recommendations.  She is chest pain-free at this point.  #2 hypertension: Continue home regimen and monitor.  #3 hyperlipidemia: Continue with statin if tolerated.  She has not tolerated it in the past..  #4 CHF: Combined diastolic and systolic with borderline EF.  Continue home regimen with gentle diuresis.  #5 non-insulin-dependent diabetes: Hold metformin.  Sliding scale insulin   DVT prophylaxis: Heparin drip Code Status: Full code Family Communication: Discussed care with patient Disposition Plan: Home Consults called: Cardiology Admission status:  Inpatient  Severity of Illness: The appropriate patient status for this patient is INPATIENT. Inpatient status is judged to be reasonable and necessary  in order to provide the required intensity of service to ensure the patient's safety. The patient's presenting symptoms, physical exam findings, and initial radiographic and laboratory data in the context of their chronic comorbidities is felt to place them at high risk for further clinical deterioration. Furthermore, it is not anticipated that the patient will be medically stable for discharge from the hospital within 2 midnights of admission. The following factors support the patient status of inpatient.   " The patient's presenting symptoms include chest pain. " The worrisome physical exam findings include mild basal crackles. " The initial radiographic and laboratory data are worrisome because of mildly elevated troponin. " The chronic co-morbidities include coronary artery disease.   * I certify that at the point of admission it is my clinical judgment that the patient will require inpatient hospital care spanning beyond 2 midnights from the point of admission due to high intensity of service, high risk for further deterioration and high frequency of surveillance required.Lonia Blood*    Kole Hilyard,LAWAL MD [facility] Pager [phone]  If 7PM-7AM, please contact night-coverage [email or web address] Password [email or web address]  11/05/2018, 5:29 AM

## 2018-11-05 DIAGNOSIS — I214 Non-ST elevation (NSTEMI) myocardial infarction: Principal | ICD-10-CM

## 2018-11-05 DIAGNOSIS — I10 Essential (primary) hypertension: Secondary | ICD-10-CM

## 2018-11-05 DIAGNOSIS — E782 Mixed hyperlipidemia: Secondary | ICD-10-CM

## 2018-11-05 LAB — HEMOGLOBIN A1C
Hgb A1c MFr Bld: 7.1 % — ABNORMAL HIGH (ref 4.8–5.6)
Mean Plasma Glucose: 157.07 mg/dL

## 2018-11-05 LAB — CBC
HCT: 34.7 % — ABNORMAL LOW (ref 36.0–46.0)
Hemoglobin: 11.9 g/dL — ABNORMAL LOW (ref 12.0–15.0)
MCH: 31.8 pg (ref 26.0–34.0)
MCHC: 34.3 g/dL (ref 30.0–36.0)
MCV: 92.8 fL (ref 80.0–100.0)
Platelets: 256 10*3/uL (ref 150–400)
RBC: 3.74 MIL/uL — ABNORMAL LOW (ref 3.87–5.11)
RDW: 11.9 % (ref 11.5–15.5)
WBC: 9.4 10*3/uL (ref 4.0–10.5)
nRBC: 0 % (ref 0.0–0.2)

## 2018-11-05 LAB — BASIC METABOLIC PANEL
Anion gap: 14 (ref 5–15)
BUN: 25 mg/dL — ABNORMAL HIGH (ref 8–23)
CO2: 28 mmol/L (ref 22–32)
Calcium: 9.5 mg/dL (ref 8.9–10.3)
Chloride: 95 mmol/L — ABNORMAL LOW (ref 98–111)
Creatinine, Ser: 0.99 mg/dL (ref 0.44–1.00)
GFR calc Af Amer: 59 mL/min — ABNORMAL LOW (ref >60)
GFR calc non Af Amer: 51 mL/min — ABNORMAL LOW (ref >60)
Glucose, Bld: 135 mg/dL — ABNORMAL HIGH (ref 70–99)
Potassium: 3.1 mmol/L — ABNORMAL LOW (ref 3.5–5.1)
Sodium: 137 mmol/L (ref 135–145)

## 2018-11-05 LAB — MAGNESIUM: Magnesium: 1.7 mg/dL (ref 1.7–2.4)

## 2018-11-05 LAB — HEPARIN LEVEL (UNFRACTIONATED)
Heparin Unfractionated: 0.58 IU/mL (ref 0.30–0.70)
Heparin Unfractionated: 0.33 IU/mL (ref 0.30–0.70)

## 2018-11-05 LAB — GLUCOSE, CAPILLARY
Glucose-Capillary: 121 mg/dL — ABNORMAL HIGH (ref 70–99)
Glucose-Capillary: 126 mg/dL — ABNORMAL HIGH (ref 70–99)
Glucose-Capillary: 140 mg/dL — ABNORMAL HIGH (ref 70–99)
Glucose-Capillary: 219 mg/dL — ABNORMAL HIGH (ref 70–99)

## 2018-11-05 LAB — TROPONIN I (HIGH SENSITIVITY)
Troponin I (High Sensitivity): 577 ng/L (ref <18)
Troponin I (High Sensitivity): 361 ng/L (ref <18)
Troponin I (High Sensitivity): 397 ng/L (ref <18)

## 2018-11-05 LAB — BRAIN NATRIURETIC PEPTIDE: B Natriuretic Peptide: 272.5 pg/mL — ABNORMAL HIGH (ref 0.0–100.0)

## 2018-11-05 MED ORDER — LEVOTHYROXINE SODIUM 88 MCG PO TABS
88.0000 ug | ORAL_TABLET | Freq: Every day | ORAL | Status: DC
  Administered 2018-11-05 – 2018-11-06 (×2): 88 ug via ORAL
  Filled 2018-11-05 (×2): qty 1

## 2018-11-05 MED ORDER — FAMOTIDINE 20 MG PO TABS
20.0000 mg | ORAL_TABLET | Freq: Two times a day (BID) | ORAL | Status: DC
  Administered 2018-11-05 – 2018-11-07 (×5): 20 mg via ORAL
  Filled 2018-11-05 (×5): qty 1

## 2018-11-05 MED ORDER — EZETIMIBE 10 MG PO TABS
10.0000 mg | ORAL_TABLET | Freq: Every day | ORAL | Status: DC
  Administered 2018-11-05 – 2018-11-07 (×3): 10 mg via ORAL
  Filled 2018-11-05 (×3): qty 1

## 2018-11-05 MED ORDER — ADULT MULTIVITAMIN W/MINERALS CH
1.0000 | ORAL_TABLET | Freq: Every day | ORAL | Status: DC
  Administered 2018-11-05 – 2018-11-07 (×3): 1 via ORAL
  Filled 2018-11-05 (×3): qty 1

## 2018-11-05 MED ORDER — METOPROLOL SUCCINATE ER 50 MG PO TB24
50.0000 mg | ORAL_TABLET | Freq: Every day | ORAL | Status: DC
  Administered 2018-11-05 – 2018-11-07 (×3): 50 mg via ORAL
  Filled 2018-11-05 (×3): qty 1

## 2018-11-05 MED ORDER — SODIUM CHLORIDE 0.9 % IV SOLN
INTRAVENOUS | Status: DC
  Administered 2018-11-05: 01:00:00 via INTRAVENOUS

## 2018-11-05 MED ORDER — ASPIRIN EC 81 MG PO TBEC
81.0000 mg | DELAYED_RELEASE_TABLET | Freq: Every day | ORAL | Status: DC
  Administered 2018-11-05 – 2018-11-07 (×2): 81 mg via ORAL
  Filled 2018-11-05 (×2): qty 1

## 2018-11-05 MED ORDER — METOPROLOL TARTRATE 12.5 MG HALF TABLET
12.5000 mg | ORAL_TABLET | Freq: Once | ORAL | Status: AC
  Administered 2018-11-05: 12.5 mg via ORAL
  Filled 2018-11-05: qty 1

## 2018-11-05 MED ORDER — ASPIRIN 81 MG PO CHEW
81.0000 mg | CHEWABLE_TABLET | ORAL | Status: AC
  Administered 2018-11-06: 81 mg via ORAL
  Filled 2018-11-05: qty 1

## 2018-11-05 MED ORDER — MAGNESIUM SULFATE 2 GM/50ML IV SOLN
2.0000 g | Freq: Once | INTRAVENOUS | Status: AC
  Administered 2018-11-05: 2 g via INTRAVENOUS
  Filled 2018-11-05: qty 50

## 2018-11-05 MED ORDER — SODIUM CHLORIDE 0.9 % IV SOLN: 250.0000 mL | INTRAVENOUS | Status: DC | PRN

## 2018-11-05 MED ORDER — SODIUM CHLORIDE 0.9% FLUSH
3.0000 mL | Freq: Two times a day (BID) | INTRAVENOUS | Status: DC
  Administered 2018-11-05 – 2018-11-07 (×2): 3 mL via INTRAVENOUS

## 2018-11-05 MED ORDER — SERTRALINE HCL 50 MG PO TABS
50.0000 mg | ORAL_TABLET | Freq: Every day | ORAL | Status: DC
  Administered 2018-11-05 – 2018-11-06 (×2): 50 mg via ORAL
  Filled 2018-11-05 (×2): qty 1

## 2018-11-05 MED ORDER — FUROSEMIDE 40 MG PO TABS
40.0000 mg | ORAL_TABLET | Freq: Two times a day (BID) | ORAL | Status: DC
  Administered 2018-11-05 – 2018-11-06 (×3): 40 mg via ORAL
  Filled 2018-11-05 (×3): qty 1

## 2018-11-05 MED ORDER — FERROUS SULFATE 325 (65 FE) MG PO TABS
325.0000 mg | ORAL_TABLET | Freq: Three times a day (TID) | ORAL | Status: DC
  Administered 2018-11-05 – 2018-11-07 (×9): 325 mg via ORAL
  Filled 2018-11-05 (×9): qty 1

## 2018-11-05 MED ORDER — SODIUM CHLORIDE 0.9 % IV SOLN
INTRAVENOUS | Status: DC
  Administered 2018-11-05 – 2018-11-06 (×2): via INTRAVENOUS

## 2018-11-05 MED ORDER — SODIUM CHLORIDE 0.9% FLUSH: 3.0000 mL | INTRAVENOUS | Status: DC | PRN

## 2018-11-05 MED ORDER — ATORVASTATIN CALCIUM 40 MG PO TABS
40.0000 mg | ORAL_TABLET | Freq: Every day | ORAL | Status: DC
  Administered 2018-11-05 – 2018-11-07 (×3): 40 mg via ORAL
  Filled 2018-11-05 (×3): qty 1

## 2018-11-05 MED ORDER — SPIRONOLACTONE 12.5 MG HALF TABLET
12.5000 mg | ORAL_TABLET | Freq: Every day | ORAL | Status: DC
  Administered 2018-11-05 – 2018-11-07 (×3): 12.5 mg via ORAL
  Filled 2018-11-05 (×4): qty 1

## 2018-11-05 MED ORDER — INSULIN ASPART 100 UNIT/ML ~~LOC~~ SOLN
0.0000 [IU] | Freq: Three times a day (TID) | SUBCUTANEOUS | Status: DC
  Administered 2018-11-05 (×2): 1 [IU] via SUBCUTANEOUS
  Administered 2018-11-05: 13:00:00 3 [IU] via SUBCUTANEOUS
  Administered 2018-11-06 – 2018-11-07 (×3): 1 [IU] via SUBCUTANEOUS

## 2018-11-05 MED ORDER — VITAMIN D 25 MCG (1000 UNIT) PO TABS
5000.0000 [IU] | ORAL_TABLET | Freq: Every day | ORAL | Status: DC
  Administered 2018-11-05 – 2018-11-07 (×3): 5000 [IU] via ORAL
  Filled 2018-11-05 (×4): qty 5

## 2018-11-05 NOTE — Consult Note (Addendum)
Cardiology Consultation:   Patient ID: Erin JoDemetris Herman MRN: [id number]; DOB: 1927/05/06  Admit date: 11/04/2018 Date of Consult: 11/05/2018  Primary Care Provider: Kirstie PeriShah, Ashish, MD Primary Cardiologist: Nona DellSamuel McDowell, MD   Primary Electrophysiologist:  None     Patient Profile:   Erin Herman is a 85 y.o. female with a hx of CAD and AS s/p CABG/TAVR (2006), HFrEF, HTN, DM, CKD, depression, HTN, who was transferred [facility] with NSTEMI who is being seen today for the evaluation of chest pain at the request of Dr. Mikeal HawthorneGarba.  History of Present Illness:   Erin Herman is a 85 y.o. female with a hx of CAD and AS s/p CABG/TAVR (2006), HFrEF, HTN, DM, CKD, depression, HTN, who was [facility] with NSTEMI who is being seen today for the evaluation of chest pain.   The has multiple cardiac comorbidities including HFpEF and CAD and prosthetic AV, for which she follows with Dr. Diona BrownerMcDowell. She was last seen in clinic in December 2019, at which time she was doing well. Subsequent TTE in 06/2018 showed normal prosthetic AV function, although with mild decrease in LVEF from normal to 45-50% with mild RV dysfunction.  [facility] to [facility] yesterday with an episode of chest pain. Workup was notable for K 2.8, troponin 0.02, which rose to 0.07 on recheck (ULN 0.01), pro-BNP 640.6 (ULN 300). ECGs showed NSR with first degree AV block and known LBBB (note- one ECG described AF, although suspect this was due due to small P waves with PACs). She was started on a heparin gtt and transferred [facility] for [facility] management.  On [facility], SBP 100-110s and HR 90s. ECG showed NSR with first degree AV block and LBBB (similar to prior). HS-Tn 518 which subsequently rose to 577. She had some mild ongoing chest pain on arrival and was started on NTG gtt with complete resolution of her pain (per nurse). She was admitted to the hospitalist service,  and cardiology was consulted for further recommendations. On my evaluation, the patient reports essentially complete resolution of her pain. She states that her chest pain began after eating breakfast. She described the pain to be a pressure sensation that radiated down her arms. She denied associated nausea, dyspnea, palpitations, LE edema. She notes that she occasionally has some fullness and swelling in her abdomen due to her HF for which she takes metolazone once every 1-2 weeks. She reports that she has been experiencing similar pain intermittently since her last cardiology appointment.   Heart Pathway Score:     Past Medical History:  Diagnosis Date   Anemia    Anxiety    Aortic stenosis    Bioprosthetic AVR 2006   Arthritis    oa   CAD (coronary artery disease), native coronary artery    Multivessel status post CABG 2006   Cardiomyopathy [facility](HCC)    Chronic kidney disease    Depression    Diabetes (HCC) 02/27/2018   Dysphagia 05/10/2018   Essential hypertension    Family history of adverse reaction to anesthesia    daughter ponv   GERD (gastroesophageal reflux disease)    Headache    none recent   History of hiatal hernia    HOH (hard of hearing)    both ears   LBBB (left bundle branch block)    Mixed hyperlipidemia    Postoperative nausea and vomiting    Morphine   Type 2 diabetes mellitus (HCC)    UTI (  lower urinary tract infection)     Past Surgical History:  Procedure Laterality Date   ABDOMINAL HYSTERECTOMY     partial   AORTIC VALVE REPLACEMENT  2006   Dr. Cornelius Moras - 23 mm Toronto stentless cadaver valve   CORONARY ARTERY BYPASS GRAFT  2006    Dr. Cornelius Moras - LIMA to LAD, SVG to circumflex   TOTAL HIP ARTHROPLASTY Right 03/01/2017   Procedure: RIGHT TOTAL HIP ARTHROPLASTY ANTERIOR APPROACH;  Surgeon: Durene Romans, MD;  Location: [facility] ORS;  Service: Orthopedics;  Laterality: Right;  70 mins   TOTAL KNEE ARTHROPLASTY Right 2006   TOTAL KNEE  ARTHROPLASTY Left 05/20/2014   DR Thurston Hole   TOTAL KNEE ARTHROPLASTY Left 05/20/2014   Procedure: LEFT TOTAL KNEE ARTHROPLASTY;  Surgeon: Nilda Simmer, MD;  Location: [facility] OR;  Service: Orthopedics;  Laterality: Left;     Home Medications:  Prior to Admission medications   Medication Sig Start Date End Date Taking? Authorizing Provider  Cholecalciferol (VITAMIN D3) 5000 UNITS TABS Take 5,000 Units by mouth daily.    [provider]  denosumab (PROLIA) 60 MG/ML SOLN injection Inject 60 mg into the skin every 6 (six) months. 05/03/17   [provider]  ezetimibe (ZETIA) 10 MG tablet Take 10 mg by mouth daily.    [provider]  ferrous sulfate (FERROUSUL) 325 (65 FE) MG tablet Take 1 tablet (325 mg total) 3 (three) times daily with meals by mouth. 03/01/17   Lanney Gins, PA-C  furosemide (LASIX) 40 MG tablet Take 1.5 tablets (60 mg total) by mouth every morning. & 40 mg in the afternoon Patient taking differently: Take 80 mg by mouth every morning.  x 2 a day 02/14/18   Jonelle Sidle, MD  levothyroxine (SYNTHROID, LEVOTHROID) 88 MCG tablet Take 88 mcg by mouth daily before breakfast.  02/27/14   [provider]  metFORMIN (GLUCOPHAGE) 500 MG tablet Take 500 mg by mouth daily with breakfast.      [provider]  metolazone (ZAROXOLYN) 5 MG tablet Take 1 tablet as needed for swelling 11/22/17   Jonelle Sidle, MD  metoprolol succinate (TOPROL-XL) 50 MG 24 hr tablet Take 1 tablet (50 mg total) by mouth daily. 07/14/18   Jonelle Sidle, MD  Multiple Vitamins-Minerals (HAIR/SKIN/NAILS/BIOTIN) TABS Take 1 tablet by mouth daily.    [provider]  Multiple Vitamins-Minerals (MULTIVITAMIN PO) Take 1 tablet by mouth daily.    [provider]  nitrofurantoin, macrocrystal-monohydrate, (MACROBID) 100 MG capsule Take 100 mg by mouth 2 (two) times daily.    [provider]  Polyethyl Glycol-Propyl Glycol (SYSTANE OP)  Place 1 drop into both eyes 4 (four) times daily as needed (for burning eyes).     [provider]  potassium chloride SA (K-DUR,KLOR-CON) 20 MEQ tablet Take 1 tablet (20 mEq total) by mouth daily after supper. 01/26/17   Jonelle Sidle, MD  psyllium (METAMUCIL) 58.6 % powder Take 1 packet by mouth daily.    [provider]  ranitidine (ZANTAC) 150 MG capsule Take 150 mg by mouth 2 (two) times daily.    [provider]  sertraline (ZOLOFT) 100 MG tablet Take 50 mg by mouth at bedtime.     [provider]  spironolactone (ALDACTONE) 25 MG tablet Take 0.5 tablets (12.5 mg total) by mouth daily. 07/06/16   Laqueta Linden, MD  vitamin C (ASCORBIC ACID) 500 MG tablet Take 500 mg by mouth 2 (two) times daily.  [provider]  VITAMIN E PO Take 1 tablet by mouth daily.    [provider]    Inpatient Medications: Scheduled Meds:  atorvastatin  40 mg Oral q1800   ezetimibe  10 mg Oral Daily   furosemide  40 mg Oral BID   metoprolol succinate  50 mg Oral Daily   spironolactone  12.5 mg Oral Daily   Continuous Infusions:  sodium chloride 100 mL/hr at 11/04/18 2209   heparin 750 Units/hr (11/04/18 2229)   nitroGLYCERIN 10 mcg/min (11/04/18 2229)   PRN Meds: acetaminophen, ondansetron (ZOFRAN) IV  Allergies:    Allergies  Allergen Reactions   Morphine Nausea And Vomiting   Penicillins Swelling and Other (See Comments)    Severe arm swelling and redness   Reclast [Zoledronic Acid]     "nausea and vomiting. Could not get out of bed.    Social History:   Social History   Socioeconomic History   Marital status: Married    Spouse name: Not on file   Number of children: Not on file   Years of education: Not on file   Highest education level: Not on file  Occupational History   Not on file  Social Needs   Financial resource strain: Not on file   Food insecurity    Worry: Not on file    Inability: Not on  file   Transportation needs    Medical: Not on file    Non-medical: Not on file  Tobacco Use   Smoking status: Never Smoker   Smokeless tobacco: Never Used  Substance and Sexual Activity   Alcohol use: No    Alcohol/week: 0.0 standard drinks   Drug use: No   Sexual activity: Not Currently  Lifestyle   Physical activity    Days per week: Not on file    Minutes per session: Not on file   Stress: Not on file  Relationships   Social connections    Talks on phone: Not on file    Gets together: Not on file    Attends religious service: Not on file    Active member of club or organization: Not on file    Attends meetings of clubs or organizations: Not on file    Relationship status: Not on file   Intimate partner violence    Fear of current or ex partner: Not on file    Emotionally abused: Not on file    Physically abused: Not on file    Forced sexual activity: Not on file  Other Topics Concern   Not on file  Social History Narrative   Not on file    Family History:    Family History  Problem Relation Age of Onset   Heart Problems Mother    Diabetes Father    Cirrhosis Father    Diabetes Other      ROS:  Please see the history of present illness.  All other ROS reviewed and negative.     Physical Exam/Data:   Vitals:   11/04/18 2305 11/04/18 2320 11/04/18 2335 11/05/18 0018  BP: 113/61 (!) 107/59 113/62   Pulse: 93 89 88   Resp: 15 17 11    Temp:    97.8 F (36.6 C)  TempSrc:    Oral  SpO2: 91% 91% 95%    No intake or output data in the 24 hours ending 11/05/18 0111 Last 3 Weights 05/10/2018 04/07/2018 02/27/2018  Weight (lbs) 136 lb 9.6 oz 136 lb 6.4 oz 136  lb 8 oz  Weight (kg) 61.961 kg 61.871 kg 61.916 kg     There is no height or weight on file to calculate BMI.  General:  Well nourished, well developed, in no acute distress HEENT: normal Neck: pulsation at mid-neck Cardiac:  normal S1, S2; RRR; II/VI systolic murmur Lungs:  clear to  auscultation bilaterally, no wheezing, rhonchi or rales  Abd: soft, nontender,  Ext: no edema Musculoskeletal:  No deformities, BUE and BLE strength normal and equal Skin: warm and dry  Neuro:  No focal abnormalities noted Psych:  Normal affect   EKG:  The EKG was personally reviewed and demonstrates:  NSR with first degree AV block and LBBB Telemetry:  Telemetry was personally reviewed and demonstrates:  NSR with LBBB and occasional PVCs  Relevant CV Studies:  TTE 06/2018:  1. The left ventricle has mildly reduced systolic function, with an ejection fraction of 45-50%. The cavity size was normal. Left ventricular diastolic Doppler parameters are consistent with impaired relaxation. Elevated mean left atrial pressure.  2. The right ventricle has mildly reduced systolic function. The cavity was mildly enlarged. There is no increase in right ventricular wall thickness.  3. Left atrial size was moderately dilated.  4. Right atrial size was mildly dilated.  5. Mild thickening of the aortic valve Mild calcification of the aortic valve. no stenosis of the aortic valve. Mild aortic annular calcification noted.  6. The mitral valve is abnormal. Mild thickening of the mitral valve leaflet. Mild calcification of the mitral valve leaflet. There is mild mitral annular calcification present. No evidence of mitral valve stenosis.  7. A 65mm Toronto stentless porcine bioprosthesis valve is present in the aortic position.  8. The interatrial septum was not assessed. Laboratory Data:  High Sensitivity Troponin:   Recent Labs  Lab 11/04/18 2142 11/04/18 2328  TROPONINIHS 518* 577*     Cardiac EnzymesNo results for input(s): TROPONINI in the last 168 hours. No results for input(s): TROPIPOC in the last 168 hours.  ChemistryNo results for input(s): NA, K, CL, CO2, GLUCOSE, BUN, CREATININE, CALCIUM, GFRNONAA, GFRAA, ANIONGAP in the last 168 hours.  No results for input(s): PROT, ALBUMIN, AST, ALT,  ALKPHOS, BILITOT in the last 168 hours. HematologyNo results for input(s): WBC, RBC, HGB, HCT, MCV, MCH, MCHC, RDW, PLT in the last 168 hours. BNPNo results for input(s): BNP, PROBNP in the last 168 hours.  DDimer No results for input(s): DDIMER in the last 168 hours.   Radiology/Studies:  No results found.  Assessment and Plan:   CAD with prior CABG NSTEMI The patient has a known history of CAD. She presents with chest pain that is typical for angina by description. Troponin is elevated and rising. ECG shows chronic LBBB. Her presentation is consistent with NSTEMI. She is essentially chest pain free while on NTG infusion. She would likely benefit from repeat LHC, which was discussed with her. -Continue NTG infusion  -Continue heparin gtt -Continue home metoprolol -Has experienced muscle aches in the past with statins. Will re-challenge with atorvastatin (discussed with her tonight). -Continue zetia -HgA1c, lipid panel ordered  HFrEF Echocardiogram in 06/2018 showed new mild decrease in LVEF. Pt reports mild fullness in abdomen, and pro-BNP mildly elevated at OSH. -Continue home furosemide (taking 40 mg BID). May benefit from a dose of IV furosemide -Continue home metoprolol -Continue home spironolactone -Was previously on ACEI, which was stopped for cough. Would consider ARB when stable given reduced LVEF  AS s/p AVR -As above, TTE in  06/2018 showed normal functioning prosthesis  HLD -Lipid panel ordered -Zetia ordered -As above, will re-challenge with statin  For questions or updates, please contact [facility] Please consult [email or web address] for contact info under     Signed, Ernest Mallickaylor Rai Severns, MD  11/05/2018 1:11 AM

## 2018-11-05 NOTE — Progress Notes (Addendum)
PROGRESS NOTE    Erin Herman  WUJ:[id number]  DOB: Jan 15, 1928  DOA: 11/04/2018 PCP: Kirstie PeriShah, Ashish, MD  Brief Narrative: 85 year old female with history of CAD status post drug-eluting stent to LAD in 2017, CHF low EF 45-50%, diabetes, hypertension, hyperlipidemia, bioprosthetic aortic valve was [facility] For evaluation of chest pain which he describes as retrosternal tightness with radiation to bilateral extremities and elbows.  Her troponins rose to 577 and are declining.  Her EKG showed chronic left bundle branch block.    Subjective: She is currently chest pain-free on IV heparin and nitroglycerin.  Seen by cardiology and planned coronary angiography tomorrow.  Objective: Vitals:   11/05/18 0438 11/05/18 0500 11/05/18 0803 11/05/18 1120  BP: (!) 88/56  (!) 98/57 104/65  Pulse: 100  76 76  Resp: (!) 22  15 11   Temp:  97.6 F (36.4 C)  97.8 F (36.6 C)  TempSrc:  Oral  Oral  SpO2: 92%  95% 100%  Weight:  59.3 kg    Height:        Intake/Output Summary (Last 24 hours) at 11/05/2018 1147 Last data filed at 11/05/2018 1100 Gross per 24 hour  Intake 950.64 ml  Output 1300 ml  Net -349.36 ml   Filed Weights   11/04/18 2027 11/05/18 0500  Weight: 59.1 kg 59.3 kg    Physical Examination:  General exam: Appears calm and comfortable  Respiratory system: Clear to auscultation. Respiratory effort normal. Cardiovascular system: S1 & S2 heard, RRR. No JVD, murmurs, rubs, gallops or clicks. No pedal edema. Gastrointestinal system: Abdomen is nondistended, soft and nontender. No organomegaly or masses felt. Normal bowel sounds heard. Central nervous system: Alert and oriented. No focal neurological deficits. Extremities: Symmetric 5 x 5 power. Skin: No rashes, lesions or ulcers Psychiatry: Judgement and insight appear normal. Mood & affect appropriate.     Data Reviewed: I have personally reviewed following labs and  imaging studies  CBC: Recent Labs  Lab 11/05/18 0419  WBC 9.4  HGB 11.9*  HCT 34.7*  MCV 92.8  PLT 256   Basic Metabolic Panel: Recent Labs  Lab 11/05/18 0419  NA 137  K 3.1*  CL 95*  CO2 28  GLUCOSE 135*  BUN 25*  CREATININE 0.99  CALCIUM 9.5  MG 1.7   GFR: Estimated Creatinine Clearance: 33.3 mL/min (by C-G formula based on SCr of 0.99 mg/dL). Liver Function Tests: No results for input(s): AST, ALT, ALKPHOS, BILITOT, PROT, ALBUMIN in the last 168 hours. No results for input(s): LIPASE, AMYLASE in the last 168 hours. No results for input(s): AMMONIA in the last 168 hours. Coagulation Profile: No results for input(s): INR, PROTIME in the last 168 hours. Cardiac Enzymes: No results for input(s): CKTOTAL, CKMB, CKMBINDEX, TROPONINI in the last 168 hours. BNP (last 3 results) No results for input(s): PROBNP in the last 8760 hours. HbA1C: Recent Labs    11/05/18 0421  HGBA1C 7.1*   CBG: Recent Labs  Lab 11/05/18 0016 11/05/18 0835 11/05/18 1116  GLUCAP 126* 140* 219*   Lipid Profile: Recent Labs    11/04/18 2142  CHOL 210*  HDL 45  LDLCALC 142*  TRIG 117  CHOLHDL 4.7   Thyroid Function Tests: No results for input(s): TSH, T4TOTAL, FREET4, T3FREE, THYROIDAB in the last 72 hours. Anemia Panel: No results for input(s): VITAMINB12, FOLATE, FERRITIN, TIBC, IRON, RETICCTPCT in the last 72 hours. Sepsis Labs: No results for input(s): PROCALCITON, LATICACIDVEN in the last  168 hours.  Recent Results (from the past 240 hour(s))  SARS Coronavirus 2 (CEPHEID - Performed in [facility] hospital lab), Hosp Order     Status: None   Collection Time: 11/04/18  9:29 PM   Specimen: Nasopharyngeal Swab  Result Value Ref Range Status   SARS Coronavirus 2 NEGATIVE NEGATIVE Final    Comment: (NOTE) If result is NEGATIVE SARS-CoV-2 target nucleic acids are NOT DETECTED. The SARS-CoV-2 RNA is generally detectable in upper and lower  respiratory specimens during the  acute phase of infection. The lowest  concentration of SARS-CoV-2 viral copies this assay can detect is 250  copies / mL. A negative result does not preclude SARS-CoV-2 infection  and should not be used as the sole basis for treatment or other  patient management decisions.  A negative result may occur with  improper specimen collection / handling, submission of specimen other  than nasopharyngeal swab, presence of viral mutation(s) within the  areas targeted by this assay, and inadequate number of viral copies  (<250 copies / mL). A negative result must be combined with clinical  observations, patient history, and epidemiological information. If result is POSITIVE SARS-CoV-2 target nucleic acids are DETECTED. The SARS-CoV-2 RNA is generally detectable in upper and lower  respiratory specimens dur ing the acute phase of infection.  Positive  results are indicative of active infection with SARS-CoV-2.  Clinical  correlation with patient history and other diagnostic information is  necessary to determine patient infection status.  Positive results do  not rule out bacterial infection or co-infection with other viruses. If result is PRESUMPTIVE POSTIVE SARS-CoV-2 nucleic acids MAY BE PRESENT.   A presumptive positive result was obtained on the submitted specimen  and confirmed on repeat testing.  While 2019 novel coronavirus  (SARS-CoV-2) nucleic acids may be present in the submitted sample  additional confirmatory testing may be necessary for epidemiological  and / or clinical management purposes  to differentiate between  SARS-CoV-2 and other Sarbecovirus currently known to infect humans.  If clinically indicated additional testing with an alternate test  methodology [phone] is advised. The SARS-CoV-2 RNA is generally  detectable in upper and lower respiratory sp ecimens during the acute  phase of infection. The expected result is Negative. Fact Sheet for Patients:   BoilerBrush.com.cyhttps://www.fda.gov/media/136312/download Fact Sheet for Healthcare Providers: https://pope.com/https://www.fda.gov/media/136313/download This test is not yet approved or cleared by the [place] FDA and has been authorized for detection and/or diagnosis of SARS-CoV-2 by FDA under an Emergency Use Authorization (EUA).  This EUA will remain in effect (meaning this test can be used) for the duration of the COVID-19 declaration under Section 564(b)(1) of the Act, 21 U.S.C. section 360bbb-3(b)(1), unless the authorization is terminated or revoked sooner. Performed at [facility], [address] [place], [place] [address]       Radiology Studies: No results found.      Scheduled Meds: . [START ON 11/06/2018] aspirin  81 mg Oral Pre-Cath  . aspirin EC  81 mg Oral Daily  . atorvastatin  40 mg Oral q1800  . cholecalciferol  5,000 Units Oral Daily  . ezetimibe  10 mg Oral Daily  . famotidine  20 mg Oral BID  . ferrous sulfate  325 mg Oral TID WC  . furosemide  40 mg Oral BID  . insulin aspart  0-9 Units Subcutaneous TID WC  . levothyroxine  88 mcg Oral Q0600  . metoprolol succinate  50 mg Oral Daily  . multivitamin with  minerals  1 tablet Oral Daily  . sertraline  50 mg Oral QHS  . sodium chloride flush  3 mL Intravenous Q12H  . spironolactone  12.5 mg Oral Daily   Continuous Infusions: . sodium chloride 10 mL/hr at 11/05/18 0115  . sodium chloride    . sodium chloride    . heparin 750 Units/hr (11/05/18 0600)  . nitroGLYCERIN 10 mcg/min (11/05/18 0600)    Assessment & Plan:    1.  Non-STEMI/ACS: Patient does have risk factors of diabetes, hypertension, hyperlipidemia prior history of CAD status post CABG. She denies being on aspirin at baseline but also denies any intolerance or history of GI bleeds.  Continue heparin and nitroglycerin drip.  Added aspirin to beta-blockers and cardiology re-challenging patient with statins.  Seen by cardiology, plan for cardiac cath in a.m.  2.   Chronic combined systolic/diastolic CHF: Last echo showed EF 45 to 50% with stage I diastolic dysfunction.  Patient appears mildly euvolemic although BNP mildly elevated (640).  Resume home medications including Aldactone/Lasix and beta-blockers.  Patient apparently also takes metolazone 1-2 times a week.H/O ACEI intolerance with cough.  3.  History of aortic stenosis: Status post bioprosthetic aortic wall replacement.  4.  Hypokalemia: In the setting of diuretics as outpatient.  Potassium at 2.8.  Replace and recheck levels.  5.  Hypothyroidism: Synthroid  6.  Hyperlipidemia: Prior history of statin intolerance per cardiology rechallenging.  On Zetia at home  7. Diabetes mellitus: Hemoglobin A1c at 7.Hold metformin in anticipation of cath.  Will monitor blood glucose with accu-checks daily/SSI  DVT prophylaxis: On heparin drip Code Status: Full code Family / Patient Communication: No family bedside, discussed with patient was oriented x3 Disposition Plan: Home when medically cleared     LOS: 1 day    Time spent: 35 minutes    [place] Shi, MD [facility] Pager [phone]  If 7PM-7AM, please contact night-coverage [email or web address] Password [facility] 11/05/2018, 11:47 AM

## 2018-11-05 NOTE — Progress Notes (Signed)
Patient still having some chest heaviness with pressure, will start a NTG drip and continue to monitor, VSS at this time, will continue to monitor.

## 2018-11-05 NOTE — Progress Notes (Addendum)
ANTICOAGULATION CONSULT NOTE - Follow Up Consult  Pharmacy Consult for heparin Indication: chest pain/ACS  Allergies  Allergen Reactions  . Morphine Nausea And Vomiting  . Penicillins Swelling and Other (See Comments)    Severe arm swelling and redness  . Reclast [Zoledronic Acid]     "nausea and vomiting. Could not get out of bed.    Patient Measurements: Height: 5\' 4"  (162.6 cm) Weight: 130 lb 12.8 oz (59.3 kg) IBW/kg (Calculated) : 54.7 Heparin Dosing Weight: 59.1 kg  Vital Signs: Temp: 97.6 F (36.4 C) (07/12 0500) Temp Source: Oral (07/12 0500) BP: 98/57 (07/12 0803) Pulse Rate: 76 (07/12 0803)  Labs: Recent Labs    11/04/18 2142 11/04/18 2328 11/05/18 0419  HGB  --   --  11.9*  HCT  --   --  34.7*  PLT  --   --  256  HEPARINUNFRC  --   --  0.58  CREATININE  --   --  0.99  TROPONINIHS 518* 577* 361*    Estimated Creatinine Clearance: 33.3 mL/min (by C-G formula based on SCr of 0.99 mg/dL).  Assessment: 46 yoF presenting with ACS/NSTEMI. Pharmacy consulted for heparin dosing. Patient not on anticoagulation PTA.  Patients heparin level therapeutic this AM at 0.58 (goal 0.3 - 0.7). Patient's Hgb 11.9/HCT 34.7 and pltc stable. No issues with heparin infusion or s/sx of bleeding per RN this AM.   Goal of Therapy:  Heparin level 0.3-0.7 units/ml Monitor platelets by anticoagulation protocol: Yes   Plan:  Continue heparin gtt at 750 units/hr Check anti-Xa level in 8 hours and daily while on heparin Monitor daily CBC Continue to monitor H&H and platelets  Thank you for allowing pharmacy to be a part of this patient's care.  Leron Croak, PharmD PGY2 Oncology/Hematology Pharmacy Resident 11/05/2018 9:20 AM

## 2018-11-05 NOTE — Progress Notes (Signed)
ANTICOAGULATION CONSULT NOTE - Follow Up Consult  Pharmacy Consult for heparin Indication: chest pain/ACS  Allergies  Allergen Reactions  . Morphine Nausea And Vomiting  . Penicillins Swelling and Other (See Comments)    Severe arm swelling and redness Did it involve swelling of the face/tongue/throat, SOB, or low BP? No Did it involve sudden or severe rash/hives, skin peeling, or any reaction on the inside of your mouth or nose? No Did you need to seek medical attention at a hospital or doctor's office? No When did it last happen?30 years If all above answers are "NO", may proceed with cephalosporin use.  Marland Kitchen Reclast [Zoledronic Acid]     "nausea and vomiting. Could not get out of bed.    Patient Measurements: Height: 5\' 4"  (162.6 cm) Weight: 130 lb 12.8 oz (59.3 kg) IBW/kg (Calculated) : 54.7 Heparin Dosing Weight: 59.1 kg  Vital Signs: Temp: 98 F (36.7 C) (07/12 1714) Temp Source: Oral (07/12 1714) BP: 110/67 (07/12 1714) Pulse Rate: 79 (07/12 1714)  Labs: Recent Labs    11/04/18 2328 11/05/18 0419 11/05/18 1041 11/05/18 1702  HGB  --  11.9*  --   --   HCT  --  34.7*  --   --   PLT  --  256  --   --   HEPARINUNFRC  --  0.58  --  0.33  CREATININE  --  0.99  --   --   TROPONINIHS 577* 361* 397*  --     Estimated Creatinine Clearance: 33.3 mL/min (by C-G formula based on SCr of 0.99 mg/dL).  Assessment: 81 yoF presenting with ACS/NSTEMI. Pharmacy consulted for heparin dosing. Patient not on anticoagulation PTA. Planning for coronary angiography 7/13.  Patient's heparin level therapeutic at 0.33 (goal 0.3 - 0.7) on drip rate 750 units/hr.  Goal of Therapy:  Heparin level 0.3-0.7 units/ml Monitor platelets by anticoagulation protocol: Yes   Plan:  Continue heparin gtt at 750 units/hr Check anti-Xa level daily while on heparin Continue to monitor H&H and platelets  Richardine Service, PharmD PGY1 Pharmacy Resident Direct Line: [phone] 11/05/2018 6:10  PM

## 2018-11-05 NOTE — Progress Notes (Signed)
Progress Note  Patient Name: Shamari Bouvier Date of Encounter: 11/05/2018  Primary Cardiologist: Rozann Lesches, MD   Subjective   No chest pain this morning on IV heparin and nitroglycerin  Inpatient Medications    Scheduled Meds: . aspirin EC  81 mg Oral Daily  . atorvastatin  40 mg Oral q1800  . cholecalciferol  5,000 Units Oral Daily  . ezetimibe  10 mg Oral Daily  . famotidine  20 mg Oral BID  . ferrous sulfate  325 mg Oral TID WC  . furosemide  40 mg Oral BID  . insulin aspart  0-9 Units Subcutaneous TID WC  . levothyroxine  88 mcg Oral Q0600  . metoprolol succinate  50 mg Oral Daily  . multivitamin with minerals  1 tablet Oral Daily  . sertraline  50 mg Oral QHS  . spironolactone  12.5 mg Oral Daily   Continuous Infusions: . sodium chloride 10 mL/hr at 11/05/18 0115  . heparin 750 Units/hr (11/05/18 0600)  . magnesium sulfate bolus IVPB 2 g (11/05/18 0936)  . nitroGLYCERIN 10 mcg/min (11/05/18 0600)   PRN Meds: acetaminophen, ondansetron (ZOFRAN) IV   Vital Signs    Vitals:   11/05/18 0423 11/05/18 0438 11/05/18 0500 11/05/18 0803  BP: 102/63 (!) 88/56  (!) 98/57  Pulse: 98 100  76  Resp: 11 (!) 22  15  Temp:   97.6 F (36.4 C)   TempSrc:   Oral   SpO2: 94% 92%  95%  Weight:   59.3 kg   Height:        Intake/Output Summary (Last 24 hours) at 11/05/2018 1025 Last data filed at 11/05/2018 0600 Gross per 24 hour  Intake 490.64 ml  Output 500 ml  Net -9.36 ml   Last 3 Weights 11/05/2018 11/04/2018 05/10/2018  Weight (lbs) 130 lb 12.8 oz 130 lb 4.7 oz 136 lb 9.6 oz  Weight (kg) 59.33 kg 59.1 kg 61.961 kg      Telemetry    Sinus rhythm/sinus tachycardia- Personally Reviewed  ECG    Sinus tachycardia at 105 with left bundle branch block- Personally Reviewed  Physical Exam   GEN: No acute distress.   Neck: No JVD Cardiac: RRR, no murmurs, rubs, or gallops.  Respiratory: Clear to auscultation bilaterally. GI: Soft, nontender,  non-distended  MS: No edema; No deformity. Neuro:  Nonfocal  Psych: Normal affect   Labs    High Sensitivity Troponin:   Recent Labs  Lab 11/04/18 2142 11/04/18 2328 11/05/18 0419  TROPONINIHS 518* 577* 361*      Cardiac EnzymesNo results for input(s): TROPONINI in the last 168 hours. No results for input(s): TROPIPOC in the last 168 hours.   Chemistry Recent Labs  Lab 11/05/18 0419  NA 137  K 3.1*  CL 95*  CO2 28  GLUCOSE 135*  BUN 25*  CREATININE 0.99  CALCIUM 9.5  GFRNONAA 51*  GFRAA 59*  ANIONGAP 14     Hematology Recent Labs  Lab 11/05/18 0419  WBC 9.4  RBC 3.74*  HGB 11.9*  HCT 34.7*  MCV 92.8  MCH 31.8  MCHC 34.3  RDW 11.9  PLT 256    BNP Recent Labs  Lab 11/05/18 0419  BNP 272.5*     DDimer No results for input(s): DDIMER in the last 168 hours.   Radiology    No results found.  Cardiac Studies   None  Patient Profile     Nakeda Rekowski is a 85 y.o. female with a  hx of CAD and AS s/p CABG/TAVR (2006), HFrEF, HTN, DM, CKD, depression, HTN, who was [facility] from [facility] [place] with NSTEMI who is being seen today for the evaluation of chest pain at the request of Dr. Mikeal HawthorneGarba.  Assessment & Plan    1: Non-STEMI- history of CAD status post CABG/TAVR in 2006.  She developed chest pain yesterday and was [facility] [place]  [facility].  Her troponins rose to 577 and are declining.  Her EKG shows chronic left bundle branch block.  She is currently pain-free on IV heparin and nitroglycerin.  Plan coronary angiography tomorrow.The patient understands that risks included but are not limited to stroke (1 in 1000), death (1 in 1000), kidney failure [usually temporary] (1 in 500), bleeding (1 in 200), allergic reaction [possibly serious] (1 in 200). The patient understands and agrees to proceed  2: Heart failure with preserved EF- BNP was mildly elevated although she denies shortness of breath.  Her ejection  fraction by recent 2D echo showed mild decrease in the 45 to 50% range.  Continue spironolactone and metoprolol.  3: Aortic stenosis status post AVR- intact by recent 2D echo  4: Hyperlipidemia- apparently statin intolerant, Zetia ordered.        For questions or updates, please contact [facility] Please consult [email or web address] for contact info under        Signed, Nanetta BattyJonathan Jaythen Hamme, MD  11/05/2018, 10:25 AM

## 2018-11-05 NOTE — Progress Notes (Signed)
Dr Rhae Hammock up to see patient and was informed that she has a Hx of CHF and her BNP was 640.6 in [place] and she has NSS running at 100/hr, will run with her Heparin and NTG drip at 10cc/hr.

## 2018-11-05 NOTE — Progress Notes (Signed)
PAtient's HR went up into the 130;s and was sustaining at 120's, patient was sleeping and her other VS were stable but EKG was obtained because she looked like she went into A-Fib with RVR which was confirmed on the EKG. Dr Rhae Hammock was called and ordered Metoprolol 12.5 mg po, will give and continue to monitor.

## 2018-11-06 ENCOUNTER — Other Ambulatory Visit (HOSPITAL_COMMUNITY): Payer: Medicare Other

## 2018-11-06 ENCOUNTER — Encounter (HOSPITAL_COMMUNITY)
Admission: AD | Disposition: A | Discharge status: Home / Self Care | Payer: Self-pay | Source: Other Acute Inpatient Hospital | Attending: Internal Medicine

## 2018-11-06 DIAGNOSIS — I2510 Atherosclerotic heart disease of native coronary artery without angina pectoris: Secondary | ICD-10-CM

## 2018-11-06 HISTORY — PX: CORONARY STENT INTERVENTION: CATH118234

## 2018-11-06 HISTORY — PX: CORONARY/GRAFT ANGIOGRAPHY: CATH118237

## 2018-11-06 LAB — GLUCOSE, CAPILLARY
Glucose-Capillary: 127 mg/dL — ABNORMAL HIGH (ref 70–99)
Glucose-Capillary: 93 mg/dL (ref 70–99)
Glucose-Capillary: 93 mg/dL (ref 70–99)
Glucose-Capillary: 121 mg/dL — ABNORMAL HIGH (ref 70–99)

## 2018-11-06 LAB — CBC
HCT: 34.1 % — ABNORMAL LOW (ref 36.0–46.0)
Hemoglobin: 11.3 g/dL — ABNORMAL LOW (ref 12.0–15.0)
MCH: 31.7 pg (ref 26.0–34.0)
MCHC: 33.1 g/dL (ref 30.0–36.0)
MCV: 95.8 fL (ref 80.0–100.0)
Platelets: 252 10*3/uL (ref 150–400)
RBC: 3.56 MIL/uL — ABNORMAL LOW (ref 3.87–5.11)
RDW: 12 % (ref 11.5–15.5)
WBC: 6.3 10*3/uL (ref 4.0–10.5)
nRBC: 0 % (ref 0.0–0.2)

## 2018-11-06 LAB — BASIC METABOLIC PANEL
Anion gap: 11 (ref 5–15)
BUN: 20 mg/dL (ref 8–23)
CO2: 28 mmol/L (ref 22–32)
Calcium: 8.9 mg/dL (ref 8.9–10.3)
Chloride: 99 mmol/L (ref 98–111)
Creatinine, Ser: 1.02 mg/dL — ABNORMAL HIGH (ref 0.44–1.00)
GFR calc Af Amer: 56 mL/min — ABNORMAL LOW (ref >60)
GFR calc non Af Amer: 49 mL/min — ABNORMAL LOW (ref >60)
Glucose, Bld: 125 mg/dL — ABNORMAL HIGH (ref 70–99)
Potassium: 3.4 mmol/L — ABNORMAL LOW (ref 3.5–5.1)
Sodium: 138 mmol/L (ref 135–145)

## 2018-11-06 LAB — HEPARIN LEVEL (UNFRACTIONATED): Heparin Unfractionated: 0.22 IU/mL — ABNORMAL LOW (ref 0.30–0.70)

## 2018-11-06 LAB — POCT ACTIVATED CLOTTING TIME
Activated Clotting Time: 252 seconds
Activated Clotting Time: 279 seconds

## 2018-11-06 SURGERY — CORONARY STENT INTERVENTION
Anesthesia: LOCAL

## 2018-11-06 MED ORDER — ANGIOPLASTY BOOK
Freq: Once | Status: AC
  Administered 2018-11-06: 1
  Filled 2018-11-06: qty 1

## 2018-11-06 MED ORDER — LIDOCAINE HCL (PF) 1 % IJ SOLN
INTRAMUSCULAR | Status: DC | PRN
  Administered 2018-11-06: 2 mL

## 2018-11-06 MED ORDER — VERAPAMIL HCL 2.5 MG/ML IV SOLN
INTRAVENOUS | Status: DC | PRN
  Administered 2018-11-06: 10 mL via INTRA_ARTERIAL

## 2018-11-06 MED ORDER — SODIUM CHLORIDE 0.9 % IV SOLN
INTRAVENOUS | Status: AC | PRN
  Administered 2018-11-06: 50 mL/h via INTRAVENOUS

## 2018-11-06 MED ORDER — HEPARIN (PORCINE) IN NACL 1000-0.9 UT/500ML-% IV SOLN
INTRAVENOUS | Status: DC | PRN
  Administered 2018-11-06 (×2): 500 mL

## 2018-11-06 MED ORDER — NITROGLYCERIN 1 MG/10 ML FOR IR/CATH LAB
INTRA_ARTERIAL | Status: DC | PRN
  Administered 2018-11-06: 200 ug

## 2018-11-06 MED ORDER — FENTANYL CITRATE (PF) 100 MCG/2ML IJ SOLN
INTRAMUSCULAR | Status: DC | PRN
  Administered 2018-11-06: 12.5 ug via INTRAVENOUS

## 2018-11-06 MED ORDER — IOHEXOL 350 MG/ML SOLN
INTRAVENOUS | Status: DC | PRN
  Administered 2018-11-06: 15:00:00 110 mL via INTRACARDIAC

## 2018-11-06 MED ORDER — TICAGRELOR 90 MG PO TABS
90.0000 mg | ORAL_TABLET | Freq: Two times a day (BID) | ORAL | Status: DC
  Administered 2018-11-06 – 2018-11-07 (×2): 90 mg via ORAL
  Filled 2018-11-06 (×2): qty 1

## 2018-11-06 MED ORDER — SODIUM CHLORIDE 0.9 % IV SOLN: 250.0000 mL | INTRAVENOUS | Status: DC | PRN

## 2018-11-06 MED ORDER — LABETALOL HCL 5 MG/ML IV SOLN: 10.0000 mg | INTRAVENOUS | Status: AC | PRN

## 2018-11-06 MED ORDER — HEART ATTACK BOUNCING BOOK
Freq: Once | Status: AC
  Administered 2018-11-06: 1
  Filled 2018-11-06: qty 1

## 2018-11-06 MED ORDER — HEPARIN SODIUM (PORCINE) 5000 UNIT/ML IJ SOLN
5000.0000 [IU] | Freq: Three times a day (TID) | INTRAMUSCULAR | Status: DC
  Administered 2018-11-07 (×2): 5000 [IU] via SUBCUTANEOUS
  Filled 2018-11-06 (×2): qty 1

## 2018-11-06 MED ORDER — MIDAZOLAM HCL 2 MG/2ML IJ SOLN
INTRAMUSCULAR | Status: AC
  Filled 2018-11-06: qty 2

## 2018-11-06 MED ORDER — HYDRALAZINE HCL 20 MG/ML IJ SOLN: 10.0000 mg | INTRAMUSCULAR | Status: AC | PRN

## 2018-11-06 MED ORDER — FENTANYL CITRATE (PF) 100 MCG/2ML IJ SOLN
INTRAMUSCULAR | Status: AC
  Filled 2018-11-06: qty 2

## 2018-11-06 MED ORDER — TICAGRELOR 90 MG PO TABS
ORAL_TABLET | ORAL | Status: DC | PRN
  Administered 2018-11-06: 180 mg via ORAL

## 2018-11-06 MED ORDER — HEPARIN SODIUM (PORCINE) 1000 UNIT/ML IJ SOLN
INTRAMUSCULAR | Status: DC | PRN
  Administered 2018-11-06: 2000 [IU] via INTRAVENOUS
  Administered 2018-11-06 (×2): 3000 [IU] via INTRAVENOUS

## 2018-11-06 MED ORDER — MIDAZOLAM HCL 2 MG/2ML IJ SOLN
INTRAMUSCULAR | Status: DC | PRN
  Administered 2018-11-06: 0.5 mg via INTRAVENOUS

## 2018-11-06 MED ORDER — SODIUM CHLORIDE 0.9% FLUSH
3.0000 mL | Freq: Two times a day (BID) | INTRAVENOUS | Status: DC
  Administered 2018-11-07: 3 mL via INTRAVENOUS

## 2018-11-06 MED ORDER — SODIUM CHLORIDE 0.9 % IV SOLN: INTRAVENOUS | Status: AC

## 2018-11-06 MED ORDER — LEVOTHYROXINE SODIUM 100 MCG PO TABS
100.0000 ug | ORAL_TABLET | Freq: Every day | ORAL | Status: DC
  Administered 2018-11-07: 100 ug via ORAL
  Filled 2018-11-06: qty 1

## 2018-11-06 MED ORDER — LIDOCAINE HCL (PF) 1 % IJ SOLN
INTRAMUSCULAR | Status: AC
  Filled 2018-11-06: qty 30

## 2018-11-06 MED ORDER — HEPARIN (PORCINE) IN NACL 1000-0.9 UT/500ML-% IV SOLN
INTRAVENOUS | Status: AC
  Filled 2018-11-06: qty 1000

## 2018-11-06 MED ORDER — NITROGLYCERIN 1 MG/10 ML FOR IR/CATH LAB
INTRA_ARTERIAL | Status: AC
  Filled 2018-11-06: qty 10

## 2018-11-06 MED ORDER — VERAPAMIL HCL 2.5 MG/ML IV SOLN
INTRAVENOUS | Status: AC
  Filled 2018-11-06: qty 2

## 2018-11-06 MED ORDER — SODIUM CHLORIDE 0.9% FLUSH: 3.0000 mL | INTRAVENOUS | Status: DC | PRN

## 2018-11-06 MED ORDER — TICAGRELOR 90 MG PO TABS
ORAL_TABLET | ORAL | Status: AC
  Filled 2018-11-06: qty 2

## 2018-11-06 SURGICAL SUPPLY — 24 items
BAG SNAP BAND KOVER 36X36 (MISCELLANEOUS) ×1 IMPLANT
BALLN EMERGE MR 2.25X8 (BALLOONS) ×2
BALLN SAPPHIRE ~~LOC~~ 2.75X8 (BALLOONS) ×1 IMPLANT
BALLN WOLVERINE 3.00X10 (BALLOONS) ×2
BALLN ~~LOC~~ EMERGE MR 3.75X8 (BALLOONS) ×2
BALLOON EMERGE MR 2.25X8 (BALLOONS) IMPLANT
BALLOON WOLVERINE 3.00X10 (BALLOONS) IMPLANT
BALLOON ~~LOC~~ EMERGE MR 3.75X8 (BALLOONS) IMPLANT
BAND CMPR LRG ZPHR (HEMOSTASIS) ×1
BAND ZEPHYR COMPRESS 30 LONG (HEMOSTASIS) ×1 IMPLANT
CATH INFINITI 5 FR IM (CATHETERS) ×1 IMPLANT
CATH INFINITI 5FR MULTPACK ANG (CATHETERS) ×1 IMPLANT
CATH LAUNCHER 6FR EBU3.5 (CATHETERS) ×1 IMPLANT
DEVICE RAD TR BAND REGULAR (VASCULAR PRODUCTS) ×1 IMPLANT
GLIDESHEATH SLEND SS 6F .021 (SHEATH) ×1 IMPLANT
GUIDEWIRE INQWIRE 1.5J.035X260 (WIRE) IMPLANT
INQWIRE 1.5J .035X260CM (WIRE) ×2
KIT ENCORE 26 ADVANTAGE (KITS) ×1 IMPLANT
KIT HEART LEFT (KITS) ×2 IMPLANT
PACK CARDIAC CATHETERIZATION (CUSTOM PROCEDURE TRAY) ×2 IMPLANT
STENT RESOLUTE ONYX 3.5X12 (Permanent Stent) ×1 IMPLANT
TRANSDUCER W/STOPCOCK (MISCELLANEOUS) ×2 IMPLANT
TUBING CIL FLEX 10 FLL-RA (TUBING) ×2 IMPLANT
WIRE RUNTHROUGH .014X180CM (WIRE) ×1 IMPLANT

## 2018-11-06 NOTE — Progress Notes (Signed)
PROGRESS NOTE    Erin Herman  WUJ:[id number]  DOB: Jul 04, 1927  DOA: 11/04/2018 PCP: Kirstie PeriShah, Ashish, MD  Brief Narrative: 85 year old female with history of CAD status post drug-eluting stent to LAD in 2017, CHF low EF 45-50%, diabetes, hypertension, hyperlipidemia, bioprosthetic aortic valve was [facility] [place]  [facility] For evaluation of chest pain which he describes as retrosternal tightness with radiation to bilateral extremities and elbows.  Her troponins rose to 577 and are declining.  Her EKG showed chronic left bundle branch block.    Subjective: She is currently chest pain-free and off IV heparin.  Seen by cardiology and  underwent coronary angiography/PTCA  Objective: Vitals:   11/06/18 1614 11/06/18 1625 11/06/18 1639 11/06/18 1654  BP: (!) 127/103 (!) 133/50 123/71 136/75  Pulse:  78 78 77  Resp:  14 15 15   Temp: 98 F (36.7 C)     TempSrc:      SpO2:  99% 100% 99%  Weight:      Height:        Intake/Output Summary (Last 24 hours) at 11/06/2018 1728 Last data filed at 11/06/2018 1524 Gross per 24 hour  Intake 987.03 ml  Output 1750 ml  Net -762.97 ml   Filed Weights   11/04/18 2027 11/05/18 0500 11/06/18 0550  Weight: 59.1 kg 59.3 kg 59.8 kg    Physical Examination:  General exam: Appears calm and comfortable  Respiratory system: Clear to auscultation. Respiratory effort normal. Cardiovascular system: S1 & S2 heard, RRR. No JVD, murmurs, rubs, gallops or clicks. No pedal edema. Gastrointestinal system: Abdomen is nondistended, soft and nontender. No organomegaly or masses felt. Normal bowel sounds heard. Central nervous system: Alert and oriented. No focal neurological deficits. Extremities: Symmetric 5 x 5 power. Skin: No rashes, lesions or ulcers Psychiatry: Judgement and insight appear normal. Mood & affect appropriate.     Data Reviewed: I have personally reviewed following labs and imaging studies  CBC:  Recent Labs  Lab 11/05/18 0419 11/06/18 0609  WBC 9.4 6.3  HGB 11.9* 11.3*  HCT 34.7* 34.1*  MCV 92.8 95.8  PLT 256 252   Basic Metabolic Panel: Recent Labs  Lab 11/05/18 0419 11/06/18 1052  NA 137 138  K 3.1* 3.4*  CL 95* 99  CO2 28 28  GLUCOSE 135* 125*  BUN 25* 20  CREATININE 0.99 1.02*  CALCIUM 9.5 8.9  MG 1.7  --    GFR: Estimated Creatinine Clearance: 32.3 mL/min (A) (by C-G formula based on SCr of 1.02 mg/dL (H)). Liver Function Tests: No results for input(s): AST, ALT, ALKPHOS, BILITOT, PROT, ALBUMIN in the last 168 hours. No results for input(s): LIPASE, AMYLASE in the last 168 hours. No results for input(s): AMMONIA in the last 168 hours. Coagulation Profile: No results for input(s): INR, PROTIME in the last 168 hours. Cardiac Enzymes: No results for input(s): CKTOTAL, CKMB, CKMBINDEX, TROPONINI in the last 168 hours. BNP (last 3 results) No results for input(s): PROBNP in the last 8760 hours. HbA1C: Recent Labs    11/05/18 0421  HGBA1C 7.1*   CBG: Recent Labs  Lab 11/05/18 1116 11/05/18 1606 11/06/18 0723 11/06/18 1141 11/06/18 1611  GLUCAP 219* 121* 127* 93 93   Lipid Profile: Recent Labs    11/04/18 2142  CHOL 210*  HDL 45  LDLCALC 142*  TRIG 117  CHOLHDL 4.7   Thyroid Function Tests: No results for input(s): TSH, T4TOTAL, FREET4, T3FREE, THYROIDAB in the last 72 hours. Anemia Panel:  No results for input(s): VITAMINB12, FOLATE, FERRITIN, TIBC, IRON, RETICCTPCT in the last 72 hours. Sepsis Labs: No results for input(s): PROCALCITON, LATICACIDVEN in the last 168 hours.  Recent Results (from the past 240 hour(s))  SARS Coronavirus 2 (CEPHEID - Performed in [facility] hospital lab), Hosp Order     Status: None   Collection Time: 11/04/18  9:29 PM   Specimen: Nasopharyngeal Swab  Result Value Ref Range Status   SARS Coronavirus 2 NEGATIVE NEGATIVE Final    Comment: (NOTE) If result is NEGATIVE SARS-CoV-2 target nucleic acids are  NOT DETECTED. The SARS-CoV-2 RNA is generally detectable in upper and lower  respiratory specimens during the acute phase of infection. The lowest  concentration of SARS-CoV-2 viral copies this assay can detect is 250  copies / mL. A negative result does not preclude SARS-CoV-2 infection  and should not be used as the sole basis for treatment or other  patient management decisions.  A negative result may occur with  improper specimen collection / handling, submission of specimen other  than nasopharyngeal swab, presence of viral mutation(s) within the  areas targeted by this assay, and inadequate number of viral copies  (<250 copies / mL). A negative result must be combined with clinical  observations, patient history, and epidemiological information. If result is POSITIVE SARS-CoV-2 target nucleic acids are DETECTED. The SARS-CoV-2 RNA is generally detectable in upper and lower  respiratory specimens dur ing the acute phase of infection.  Positive  results are indicative of active infection with SARS-CoV-2.  Clinical  correlation with patient history and other diagnostic information is  necessary to determine patient infection status.  Positive results do  not rule out bacterial infection or co-infection with other viruses. If result is PRESUMPTIVE POSTIVE SARS-CoV-2 nucleic acids MAY BE PRESENT.   A presumptive positive result was obtained on the submitted specimen  and confirmed on repeat testing.  While 2019 novel coronavirus  (SARS-CoV-2) nucleic acids may be present in the submitted sample  additional confirmatory testing may be necessary for epidemiological  and / or clinical management purposes  to differentiate between  SARS-CoV-2 and other Sarbecovirus currently known to infect humans.  If clinically indicated additional testing with an alternate test  methodology [phone]([id number]) is advised. The SARS-CoV-2 RNA is generally  detectable in upper and lower respiratory sp ecimens  during the acute  phase of infection. The expected result is Negative. Fact Sheet for Patients:  BoilerBrush.com.cyhttps://www.fda.gov/media/136312/download Fact Sheet for Healthcare Providers: https://pope.com/https://www.fda.gov/media/136313/download This test is not yet approved or cleared by the [place] FDA and has been authorized for detection and/or diagnosis of SARS-CoV-2 by FDA under an Emergency Use Authorization (EUA).  This EUA will remain in effect (meaning this test can be used) for the duration of the COVID-19 declaration under Section 564(b)(1) of the Act, 21 U.S.C. section 360bbb-3(b)(1), unless the authorization is terminated or revoked sooner. Performed at [facility], [address] [place], [place] [address]       Radiology Studies: No results found.      Scheduled Meds: . aspirin EC  81 mg Oral Daily  . atorvastatin  40 mg Oral q1800  . cholecalciferol  5,000 Units Oral Daily  . ezetimibe  10 mg Oral Daily  . famotidine  20 mg Oral BID  . ferrous sulfate  325 mg Oral TID WC  . heparin  5,000 Units Subcutaneous Q8H  . insulin aspart  0-9 Units Subcutaneous TID WC  . [START ON 11/07/2018] levothyroxine  100 mcg Oral Q0600  . metoprolol succinate  50 mg Oral Daily  . multivitamin with minerals  1 tablet Oral Daily  . sertraline  50 mg Oral QHS  . sodium chloride flush  3 mL Intravenous Q12H  . sodium chloride flush  3 mL Intravenous Q12H  . spironolactone  12.5 mg Oral Daily  . [START ON 11/07/2018] ticagrelor  90 mg Oral BID   Continuous Infusions: . sodium chloride Stopped (11/05/18 1915)  . sodium chloride 75 mL/hr at 11/06/18 1512  . sodium chloride      Assessment & Plan:    1.  Non-STEMI/ACS: Patient does have risk factors of diabetes, hypertension, hyperlipidemia prior history of CAD status post CABG. She denies being on aspirin at baseline but also denies any intolerance or history of GI bleeds.  Added aspirin to beta-blockers and cardiology re-challenging  patient with statins.   Patient underwent cardiac cath today and apparently had one stent placed (official report pending) and now started on Brilinta by cardiology.  Off heparin drip.  2.  Chronic combined systolic/diastolic CHF: Last echo showed EF 45 to 50% with stage I diastolic dysfunction.  Patient appears mildly euvolemic although BNP mildly elevated (640).  Resumed home medications including Aldactone/Lasix and beta-blockers.  Patient apparently also takes metolazone 1-2 times a week.H/O ACEI intolerance with cough.  3.  History of aortic stenosis: Status post bioprosthetic aortic wall replacement.  4.  Hypokalemia: In the setting of diuretics as outpatient.  Potassium at 2.8.  Replace and recheck levels.  5.  Hypothyroidism: Synthroid  6.  Hyperlipidemia: Prior history of statin intolerance, cardiology rechallenging.  On Zetia at home  7. Diabetes mellitus: Hemoglobin A1c at 7.Held metformin in anticipation of cath.  Will monitor blood glucose with accu-checks daily/SSI  DVT prophylaxis: Heparin Code Status: Full code Family / Patient Communication: No family bedside, discussed with patient was oriented x3 Disposition Plan: Home likely in a.m. if cleared by cardiology.  Downgrade to telemetry     LOS: 2 days    Time spent: 35 minutes    Guilford Shi, MD [facility] Pager [phone]  If 7PM-7AM, please contact night-coverage [email or web address] Password [facility] 11/06/2018, 5:28 PM

## 2018-11-06 NOTE — Interval H&P Note (Signed)
History and Physical Interval Note:  11/06/2018 1:19 PM  Erin Herman  has presented today for cardiac catheterization, with the diagnosis of NSTEMI.  The various methods of treatment have been discussed with the patient and family. After consideration of risks, benefits and other options for treatment, the patient has consented to  Procedure(s): LEFT HEART CATH AND CORONARY ANGIOGRAPHY (N/A) as a surgical intervention.  The patient's history has been reviewed, patient examined, no change in status, stable for surgery.  I have reviewed the patient's chart and labs.  Questions were answered to the patient's satisfaction.    Cath Lab Visit (complete for each Cath Lab visit)  Clinical Evaluation Leading to the Procedure:   ACS: Yes.    Non-ACS:  N/A  Kellen Hover

## 2018-11-06 NOTE — Progress Notes (Signed)
TR BAND REMOVAL  LOCATION:    left radial  DEFLATED PER PROTOCOL:    Yes.    TIME BAND OFF / DRESSING APPLIED:    21:15   SITE UPON ARRIVAL:    Level 0  SITE AFTER BAND REMOVAL:    Level 1(Bruise)  CIRCULATION SENSATION AND MOVEMENT:    Within Normal Limits   Yes.    COMMENTS:   Post TR band instructions given. Pt tolerated well.

## 2018-11-06 NOTE — H&P (View-Only) (Signed)
Progress Note  Patient Name: Seraphine Gottschall Date of Encounter: 11/06/2018  Primary Cardiologist: Nona DellSamuel McDowell, MD   Subjective   No CP or dyspnea  Inpatient Medications    Scheduled Meds: . aspirin EC  81 mg Oral Daily  . atorvastatin  40 mg Oral q1800  . cholecalciferol  5,000 Units Oral Daily  . ezetimibe  10 mg Oral Daily  . famotidine  20 mg Oral BID  . ferrous sulfate  325 mg Oral TID WC  . furosemide  40 mg Oral BID  . insulin aspart  0-9 Units Subcutaneous TID WC  . levothyroxine  88 mcg Oral Q0600  . metoprolol succinate  50 mg Oral Daily  . multivitamin with minerals  1 tablet Oral Daily  . sertraline  50 mg Oral QHS  . sodium chloride flush  3 mL Intravenous Q12H  . spironolactone  12.5 mg Oral Daily   Continuous Infusions: . sodium chloride Stopped (11/05/18 1915)  . sodium chloride    . sodium chloride 50 mL/hr at 11/06/18 0654  . heparin 850 Units/hr (11/06/18 0655)  . nitroGLYCERIN 5 mcg/min (11/06/18 0600)   PRN Meds: sodium chloride, acetaminophen, ondansetron (ZOFRAN) IV, sodium chloride flush   Vital Signs    Vitals:   11/06/18 0350 11/06/18 0450 11/06/18 0550 11/06/18 0724  BP: 115/71 107/67 (!) 110/59 114/69  Pulse: 73 73 74 79  Resp: 17 16 16 12   Temp:   (!) 97.1 F (36.2 C) (!) 97.4 F (36.3 C)  TempSrc:   Oral Oral  SpO2: 95% 95% 94% 97%  Weight:   59.8 kg   Height:        Intake/Output Summary (Last 24 hours) at 11/06/2018 0845 Last data filed at 11/06/2018 0727 Gross per 24 hour  Intake 1787.04 ml  Output 1850 ml  Net -62.96 ml   Last 3 Weights 11/06/2018 11/05/2018 11/04/2018  Weight (lbs) 131 lb 14.4 oz 130 lb 12.8 oz 130 lb 4.7 oz  Weight (kg) 59.829 kg 59.33 kg 59.1 kg      Telemetry    Sinus with couplet - Personally Reviewed   Physical Exam   GEN: No acute distress.   Neck: No JVD Cardiac: RRR, no murmurs, rubs, or gallops.  Respiratory: Clear to auscultation bilaterally. GI: Soft, nontender,  non-distended  MS: No edema Neuro:  Nonfocal  Psych: Normal affect   Labs    High Sensitivity Troponin:   Recent Labs  Lab 11/04/18 2142 11/04/18 2328 11/05/18 0419 11/05/18 1041  TROPONINIHS 518* 577* 361* 397*      Chemistry Recent Labs  Lab 11/05/18 0419  NA 137  K 3.1*  CL 95*  CO2 28  GLUCOSE 135*  BUN 25*  CREATININE 0.99  CALCIUM 9.5  GFRNONAA 51*  GFRAA 59*  ANIONGAP 14     Hematology Recent Labs  Lab 11/05/18 0419 11/06/18 0609  WBC 9.4 6.3  RBC 3.74* 3.56*  HGB 11.9* 11.3*  HCT 34.7* 34.1*  MCV 92.8 95.8  MCH 31.8 31.7  MCHC 34.3 33.1  RDW 11.9 12.0  PLT 256 252    BNP Recent Labs  Lab 11/05/18 0419  BNP 272.5*    Patient Profile     Milinda Hutchersonis a 85 y.o.femalewith a hx of CAD and AS s/p CABG/TAVR (2006), HFrEF, HTN, DM, CKD, depression, HTN, who [facility] [place] [facility]  Assessment & Plan    1 non-ST elevation myocardial infarction-patient denies recurrent chest pain.  Plan is  for cardiac catheterization today.  The risks and benefits including myocardial infarction, CVA and death discussed and she agrees to proceed.  Hold Lasix prior to procedure.  Continue aspirin, heparin, beta-blocker and statin.  Continue IV nitroglycerin.  2 chronic combined systolic/diastolic congestive heart failure-she appears to be euvolemic today.  We will hold Lasix today in anticipation of cardiac catheterization.  Will resume tomorrow at home dose.  3 history of aortic valve replacement-echocardiogram March 2020 showed ejection fraction 45 to 50% and normally functioning aortic valve with mean gradient 10 mmHg.  4 hyperlipidemia-continue present medications.  For questions or updates, please contact [place] Please consult [email or web address] for contact info under        Signed, Kirk Ruths, MD  11/06/2018, 8:45 AM

## 2018-11-06 NOTE — Progress Notes (Signed)
ANTICOAGULATION CONSULT NOTE - Follow Up Consult  Pharmacy Consult for heparin Indication: NSTEMI  Labs: Recent Labs    11/04/18 2328 11/05/18 0419 11/05/18 1041 11/05/18 1702 11/06/18 0609  HGB  --  11.9*  --   --  11.3*  HCT  --  34.7*  --   --  34.1*  PLT  --  256  --   --  252  HEPARINUNFRC  --  0.58  --  0.33 0.22*  CREATININE  --  0.99  --   --   --   TROPONINIHS 577* 361* 397*  --   --     Assessment: 85yo female subtherapeutic on heparin after two levels at goal though had been trending down; no gtt issues or signs of bleeding per RN.  Goal of Therapy:  Heparin level 0.3-0.7 units/ml   Plan:  Will increase heparin gtt by 2 units/kg/hr to 850 units/hr and check level in 8 hours vs f/u after angiogram.    Wynona Neat, PharmD, BCPS  11/06/2018,6:52 AM

## 2018-11-06 NOTE — Progress Notes (Signed)
 Progress Note  Patient Name: Erin Herman Date of Encounter: 11/06/2018  Primary Cardiologist: Samuel McDowell, MD   Subjective   No CP or dyspnea  Inpatient Medications    Scheduled Meds: . aspirin EC  81 mg Oral Daily  . atorvastatin  40 mg Oral q1800  . cholecalciferol  5,000 Units Oral Daily  . ezetimibe  10 mg Oral Daily  . famotidine  20 mg Oral BID  . ferrous sulfate  325 mg Oral TID WC  . furosemide  40 mg Oral BID  . insulin aspart  0-9 Units Subcutaneous TID WC  . levothyroxine  88 mcg Oral Q0600  . metoprolol succinate  50 mg Oral Daily  . multivitamin with minerals  1 tablet Oral Daily  . sertraline  50 mg Oral QHS  . sodium chloride flush  3 mL Intravenous Q12H  . spironolactone  12.5 mg Oral Daily   Continuous Infusions: . sodium chloride Stopped (11/05/18 1915)  . sodium chloride    . sodium chloride 50 mL/hr at 11/06/18 0654  . heparin 850 Units/hr (11/06/18 0655)  . nitroGLYCERIN 5 mcg/min (11/06/18 0600)   PRN Meds: sodium chloride, acetaminophen, ondansetron (ZOFRAN) IV, sodium chloride flush   Vital Signs    Vitals:   11/06/18 0350 11/06/18 0450 11/06/18 0550 11/06/18 0724  BP: 115/71 107/67 (!) 110/59 114/69  Pulse: 73 73 74 79  Resp: 17 16 16 12  Temp:   (!) 97.1 F (36.2 C) (!) 97.4 F (36.3 C)  TempSrc:   Oral Oral  SpO2: 95% 95% 94% 97%  Weight:   59.8 kg   Height:        Intake/Output Summary (Last 24 hours) at 11/06/2018 0845 Last data filed at 11/06/2018 0727 Gross per 24 hour  Intake 1787.04 ml  Output 1850 ml  Net -62.96 ml   Last 3 Weights 11/06/2018 11/05/2018 11/04/2018  Weight (lbs) 131 lb 14.4 oz 130 lb 12.8 oz 130 lb 4.7 oz  Weight (kg) 59.829 kg 59.33 kg 59.1 kg      Telemetry    Sinus with couplet - Personally Reviewed   Physical Exam   GEN: No acute distress.   Neck: No JVD Cardiac: RRR, no murmurs, rubs, or gallops.  Respiratory: Clear to auscultation bilaterally. GI: Soft, nontender,  non-distended  MS: No edema Neuro:  Nonfocal  Psych: Normal affect   Labs    High Sensitivity Troponin:   Recent Labs  Lab 11/04/18 2142 11/04/18 2328 11/05/18 0419 11/05/18 1041  TROPONINIHS 518* 577* 361* 397*      Chemistry Recent Labs  Lab 11/05/18 0419  NA 137  K 3.1*  CL 95*  CO2 28  GLUCOSE 135*  BUN 25*  CREATININE 0.99  CALCIUM 9.5  GFRNONAA 51*  GFRAA 59*  ANIONGAP 14     Hematology Recent Labs  Lab 11/05/18 0419 11/06/18 0609  WBC 9.4 6.3  RBC 3.74* 3.56*  HGB 11.9* 11.3*  HCT 34.7* 34.1*  MCV 92.8 95.8  MCH 31.8 31.7  MCHC 34.3 33.1  RDW 11.9 12.0  PLT 256 252    BNP Recent Labs  Lab 11/05/18 0419  BNP 272.5*    Patient Profile     Areya Chohanis a 85 y.o.femalewith a hx of CAD and AS s/p CABG/TAVR (2006), HFrEF, HTN, DM, CKD, depression, HTN, who was transferred from [facility] with NSTEMI  Assessment & Plan    1 non-ST elevation myocardial infarction-patient denies recurrent chest pain.  Plan is   for cardiac catheterization today.  The risks and benefits including myocardial infarction, CVA and death discussed and she agrees to proceed.  Hold Lasix prior to procedure.  Continue aspirin, heparin, beta-blocker and statin.  Continue IV nitroglycerin.  2 chronic combined systolic/diastolic congestive heart failure-she appears to be euvolemic today.  We will hold Lasix today in anticipation of cardiac catheterization.  Will resume tomorrow at home dose.  3 history of aortic valve replacement-echocardiogram March 2020 showed ejection fraction 45 to 50% and normally functioning aortic valve with mean gradient 10 mmHg.  4 hyperlipidemia-continue present medications.  For questions or updates, please contact [place] Please consult [email or web address] for contact info under        Signed, Kirk Ruths, MD  11/06/2018, 8:45 AM

## 2018-11-07 ENCOUNTER — Other Ambulatory Visit: Payer: Self-pay | Admitting: Cardiology

## 2018-11-07 ENCOUNTER — Inpatient Hospital Stay (HOSPITAL_COMMUNITY): Payer: Medicare Other

## 2018-11-07 ENCOUNTER — Encounter (HOSPITAL_COMMUNITY): Payer: Self-pay | Admitting: Internal Medicine

## 2018-11-07 ENCOUNTER — Other Ambulatory Visit: Payer: Self-pay | Admitting: *Deleted

## 2018-11-07 DIAGNOSIS — I340 Nonrheumatic mitral (valve) insufficiency: Secondary | ICD-10-CM

## 2018-11-07 DIAGNOSIS — I5032 Chronic diastolic (congestive) heart failure: Secondary | ICD-10-CM

## 2018-11-07 DIAGNOSIS — I10 Essential (primary) hypertension: Secondary | ICD-10-CM

## 2018-11-07 LAB — BASIC METABOLIC PANEL
Anion gap: 13 (ref 5–15)
BUN: 17 mg/dL (ref 8–23)
CO2: 26 mmol/L (ref 22–32)
Calcium: 8.4 mg/dL — ABNORMAL LOW (ref 8.9–10.3)
Chloride: 99 mmol/L (ref 98–111)
Creatinine, Ser: 0.98 mg/dL (ref 0.44–1.00)
GFR calc Af Amer: 59 mL/min — ABNORMAL LOW (ref >60)
GFR calc non Af Amer: 51 mL/min — ABNORMAL LOW (ref >60)
Glucose, Bld: 132 mg/dL — ABNORMAL HIGH (ref 70–99)
Potassium: 2.7 mmol/L — CL (ref 3.5–5.1)
Sodium: 138 mmol/L (ref 135–145)

## 2018-11-07 LAB — CBC
HCT: 33 % — ABNORMAL LOW (ref 36.0–46.0)
Hemoglobin: 11.2 g/dL — ABNORMAL LOW (ref 12.0–15.0)
MCH: 31.7 pg (ref 26.0–34.0)
MCHC: 33.9 g/dL (ref 30.0–36.0)
MCV: 93.5 fL (ref 80.0–100.0)
Platelets: 242 10*3/uL (ref 150–400)
RBC: 3.53 MIL/uL — ABNORMAL LOW (ref 3.87–5.11)
RDW: 12.1 % (ref 11.5–15.5)
WBC: 6.8 10*3/uL (ref 4.0–10.5)
nRBC: 0 % (ref 0.0–0.2)

## 2018-11-07 LAB — GLUCOSE, CAPILLARY
Glucose-Capillary: 129 mg/dL — ABNORMAL HIGH (ref 70–99)
Glucose-Capillary: 103 mg/dL — ABNORMAL HIGH (ref 70–99)
Glucose-Capillary: 132 mg/dL — ABNORMAL HIGH (ref 70–99)

## 2018-11-07 LAB — POTASSIUM: Potassium: 4.1 mmol/L (ref 3.5–5.1)

## 2018-11-07 LAB — MAGNESIUM: Magnesium: 1.9 mg/dL (ref 1.7–2.4)

## 2018-11-07 LAB — ECHOCARDIOGRAM LIMITED
Height: 64 in
Weight: 2095.25 oz

## 2018-11-07 MED ORDER — POTASSIUM CHLORIDE CRYS ER 20 MEQ PO TBCR
40.0000 meq | EXTENDED_RELEASE_TABLET | Freq: Once | ORAL | Status: AC
  Administered 2018-11-07: 40 meq via ORAL
  Filled 2018-11-07: qty 2

## 2018-11-07 MED ORDER — METFORMIN HCL 500 MG PO TABS: 500.0000 mg | ORAL_TABLET | Freq: Two times a day (BID) | ORAL | Status: AC

## 2018-11-07 MED ORDER — ATORVASTATIN CALCIUM 40 MG PO TABS: 40.0000 mg | ORAL_TABLET | Freq: Every day | ORAL | 1 refills | Status: DC

## 2018-11-07 MED ORDER — FAMOTIDINE 20 MG PO TABS: 20.0000 mg | ORAL_TABLET | Freq: Two times a day (BID) | ORAL | 1 refills | Status: DC

## 2018-11-07 MED ORDER — TICAGRELOR 90 MG PO TABS: 90.0000 mg | ORAL_TABLET | Freq: Two times a day (BID) | ORAL | 1 refills | Status: DC

## 2018-11-07 MED ORDER — FUROSEMIDE 40 MG PO TABS: 40.0000 mg | ORAL_TABLET | ORAL | Status: DC

## 2018-11-07 MED ORDER — POTASSIUM CHLORIDE CRYS ER 20 MEQ PO TBCR
40.0000 meq | EXTENDED_RELEASE_TABLET | ORAL | Status: AC
  Administered 2018-11-07 (×2): 40 meq via ORAL
  Filled 2018-11-07 (×2): qty 2

## 2018-11-07 MED ORDER — ASPIRIN 81 MG PO TBEC: 81.0000 mg | DELAYED_RELEASE_TABLET | Freq: Every day | ORAL | 2 refills | Status: DC

## 2018-11-07 MED ORDER — LOSARTAN POTASSIUM 25 MG PO TABS: 25.0000 mg | ORAL_TABLET | Freq: Every day | ORAL | 0 refills | Status: DC

## 2018-11-07 NOTE — Progress Notes (Signed)
Per Daune Perch, BMET in one week. Lab order faxed to [facility]

## 2018-11-07 NOTE — Discharge Instructions (Signed)
Acute Coronary Syndrome °Acute coronary syndrome (ACS) is a serious problem in which there is suddenly not enough blood and oxygen reaching the heart. ACS can result in chest pain or a heart attack. °This condition is a medical emergency. If you have any symptoms of this condition, get help right away. °What are the causes? °This condition may be caused by: °· A buildup of fat and cholesterol inside the arteries (atherosclerosis). This is the most common cause. The buildup (plaque) can cause blood vessels in the heart (coronary arteries) to become narrow or blocked, which reduces blood flow to the heart. Plaque can also break off and lead to a clot, which can block an artery and cause a heart attack or stroke. °· Sudden tightening of the muscles around the coronary arteries (coronary spasm). °· Tearing of a coronary artery (spontaneous coronary artery dissection). °· Very low blood pressure (hypotension). °· An abnormal heartbeat (arrhythmia). °· Other medical conditions that cause a decrease of oxygen to the heart, such as anemiaorrespiratory failure. °· Using cocaine or methamphetamine. °What increases the risk? °The following factors may make you more likely to develop this condition: °· Age. The risk for ACS increases as you get older. °· History of chest pain, heart attack, peripheral artery disease, or stroke. °· Having taken chemotherapy or immune-suppressing medicines. °· Being female. °· Family history of chest pain, heart disease, or stroke. °· Smoking. °· Not exercising enough. °· Being overweight. °· High cholesterol. °· High blood pressure (hypertension). °· Diabetes. °· Excessive alcohol use. °What are the signs or symptoms? °Common symptoms of this condition include: °· Chest pain. The pain may last a long time, or it may stop and come back (recur). It may feel like: °? Crushing or squeezing. °? Tightness, pressure, fullness, or heaviness. °· Arm, neck, jaw, or back pain. °· Heartburn or  indigestion. °· Shortness of breath. °· Nausea. °· Sudden cold sweats. °· Light-headedness. °· Dizziness or passing out. °· Tiredness (fatigue). °Sometimes there are no symptoms. °How is this diagnosed? °This condition may be diagnosed based on: °· Your medical history and symptoms. °· Imaging tests, such as: °? An electrocardiogram (ECG). This measures the heart's electrical activity. °? X-rays. °? CT scan. °? A coronary angiogram. For this test, dye is injected into the heart arteries and then X-rays are taken. °? Myocardial perfusion imaging. This test shows how well blood flows through your heart muscle. °· Blood tests. These may be repeated at certain time intervals. °· Exercise stress testing. °· Echocardiogram. This is a test that uses sound waves to produce detailed images of the heart. °How is this treated? °Treatment for this condition may include: °· Oxygen therapy. °· Medicines, such as: °? Antiplatelet medicines and blood-thinning medicines, such as aspirin. These help prevent blood clots. °? Medicine that dissolves any blood clots (fibrinolytic therapy). °? Blood pressure medicines. °? Nitroglycerin. This helps widen blood vessels to improve blood flow. °? Pain medicine. °? Cholesterol-lowering medicine. °· Surgery, such as: °? Coronary angioplasty with stent placement. This involves placing a small piece of metal that looks like mesh or a spring into a narrow coronary artery. This widens the artery and keeps it open. °? Coronary artery bypass surgery. This involves taking a section of a blood vessel from a different part of your body and placing it on the blocked coronary artery to allow blood to flow around the blockage. °· Cardiac rehabilitation. This is a program that includes exercise training, education, and counseling to help you recover. °  Follow these instructions at home: °Eating and drinking °· Eat a heart-healthy diet that includes whole grains, fruits and vegetables, lean proteins, and  low-fat or nonfat dairy products. °· Limit how much salt (sodium) you eat as told by your health care provider. Follow instructions from your health care provider about any other eating or drinking restrictions, such as limiting foods that are high in fat and processed sugars. °· Use healthy cooking methods such as roasting, grilling, broiling, baking, poaching, steaming, or stir-frying. °· Work with a dietitian to follow a heart-healthy eating plan. °Medicines °· Take over-the-counter and prescription medicines only as told by your health care provider. °· Do not take these medicines unless your health care provider approves: °? Vitamin supplements that contain vitamin A or vitamin E. °? NSAIDs, such as ibuprofen, naproxen, or celecoxib. °? Hormone replacement therapy that contains estrogen. °· If you are taking blood thinners: °? Talk with your health care provider before you take any medicines that contain aspirin or NSAIDs. These medicines increase your risk for dangerous bleeding. °? Take your medicine exactly as told, at the same time every day. °? Avoid activities that could cause injury or bruising, and follow instructions about how to prevent falls. °? Wear a medical alert bracelet, and carry a card that lists what medicines you take. °Activity °· Follow your cardiac rehabilitation program. Do exercises as told by your physical therapist. °· Ask your health care provider what activities and exercises are safe for you. Follow his or her instructions about lifting, driving, or climbing stairs. °Lifestyle °· Do not use any products that contain nicotine or tobacco, such as cigarettes, e-cigarettes, and chewing tobacco. If you need help quitting, ask your health care provider. °· Do not drink alcohol if your health care provider tells you not to drink. °· If you drink alcohol: °? Limit how much you have to 0-1 drink a day. °? Be aware of how much alcohol is in your drink. In the [place] one drink equals one 12 oz  bottle of beer (355 mL), one 5 oz glass of wine (148 mL), or one 1½ oz glass of hard liquor (44 mL). °· Maintain a healthy weight. If you need to lose weight, work with your health care provider to do so safely. °General instructions °· Tell all the health care providers who provide care for you about your heart condition, including your dentist. This may affect the medicines or treatment you receive. °· Manage any other health conditions you have, such as hypertension or diabetes. These conditions affect your heart. °· Pay attention to your mental health. You may be at higher risk for depression. °? Find ways to manage stress. °? Talk to your health care provider about depression screening and treatment. °· Keep your vaccinations up to date. °? Get the flu shot (influenza vaccine) every year. °? Get the pneumococcal vaccine if you are age 65 or older. °· If directed, monitor your blood pressure at home. °· Keep all follow-up visits as told by your health care provider. This is important. °Contact a health care provider if you: °· Feel overwhelmed or sad. °· Have trouble doing your daily activities. °Get help right away if you: °· Have pain in your chest, neck, arm, jaw, stomach, or back that recurs, and: °? It lasts for more than a few minutes. °? It is not relieved by taking the medicineyour health care provider prescribed. °· Have unexplained: °? Heavy sweating. °? Heartburn or indigestion. °? Nausea or vomiting. °?   Shortness of breath. °? Difficulty breathing. °? Fatigue. °? Nervousness or anxiety. °? Weakness. °? Diarrhea. °? Dark stools or blood in your stool. °· Have sudden light-headedness or dizziness. °· Have blood pressure that is higher than 180/120. °· Faint. °· Have thoughts about hurting yourself. °These symptoms may represent a serious problem that is an emergency. Do not wait to see if the symptoms will go away. Get medical help right away. Call your local emergency services (911 in the [place] Do  not drive yourself to the hospital.  °Summary °· Acute coronary syndrome (ACS) is when there is not enough blood and oxygen being supplied to the heart. ACS can result in chest pain or a heart attack. °· Acute coronary syndrome is a medical emergency. If you have any symptoms of this condition, get help right away. °· Treatment includes medicines and procedures to open the blocked arteries and restore blood flow. °This information is not intended to replace advice given to you by your health care provider. Make sure you discuss any questions you have with your health care provider. °Document Released: 04/12/2005 Document Revised: 04/24/2018 Document Reviewed: 04/24/2018 °Elsevier Patient Education © 2020 [employer] ° °

## 2018-11-07 NOTE — Progress Notes (Signed)
  Echocardiogram 2D Echocardiogram has been performed.  Erin Herman 11/07/2018, 10:57 AM

## 2018-11-07 NOTE — Discharge Summary (Signed)
Physician Discharge Summary  Erin Herman HBZ:[id number] DOB: 1927-05-12 DOA: 11/04/2018  PCP: Monico Blitz, MD  Admit date: 11/04/2018 Discharge date: 11/07/2018  Time spent: 45 minutes  Recommendations for Outpatient Follow-up:  Patient will be discharged to home.  Patient will need to follow up with primary care provider within one week of discharge, repeat CBC and BMP.  Follow up with cardiology. Patient should continue medications as prescribed.  Patient should follow a heart healthy/carb modified diet.   Discharge Diagnoses:  NSTEMI/ACS Chronic combined systolic/diastolic CHF History of aortic stenosis Hypokalemia Hypothyroidism Hyperlipidemia Diabetes mellitus, type II  Discharge Condition: Stable  Diet recommendation: heart healthy/carb modified   Filed Weights   11/05/18 0500 11/06/18 0550 11/07/18 0454  Weight: 59.3 kg 59.8 kg 59.4 kg    History of present illness:  On 11/04/2018 by Dr. Gala Romney Chantrell Herman is a 85 y.o. female with medical history significant of coronary artery disease, ischemic cardiomyopathy with EF of 45 to 50%, diabetes hypertension, hyperlipidemia, aortic valve disease with prosthetic valve in place,, diastolic dysfunction and diabetes who was transferred from [facility] after admission with chest pain.  Pain was rated as 7 out of 10.  More pressure.  There was diffuse pain around the chest with some radiation in different directions.  Has EKG with new findings of left bundle branch block and positive troponins to 0.07 with the cardiac point as less than 0.01.  Patient has had exertional chest pain just for 2 days.  Patient initiated on nitroglycerin as well as heparin drip and transferred over after discussion with cardiology.Erin Herman  Hospital Course:  NSTEMI/ACS -Risk factors include diabetes, hypertension, hyperlipidemia, history of CAD status post CABG -Has not been on aspirin as an outpatient given the history of intolerance  and GI bleeds -Cardiology consulted and appreciated, status post catheterization with PCI of the left circumflex -Continue aspirin, Brilinta, statin, metoprolol -Cardiology recommended dual antiplatelet therapy with aspirin anticoagulant for at least 12 months.  Discontinuation of aspirin after 3 months could be considered to reduce bleeding risk. -Echocardiogram post cath: EF 30-35%, G3DD -Discussed with Dr. Stanford Breed- given he ACEi intolerance, will start on low dose ARB- losartan 25mg  daily with BMP in one week.   Chronic combined systolic/diastolic CHF -Appears to be compensated and euvolemic -Echocardiogram as above -Patient to restart Lasix and potassium on 11/08/2018 per cardiology  History of aortic stenosis -Status post bioprosthetic aortic valve  Hypokalemia -Replaced, K4.1 -Repeat BMP in 1 week  Hypothyroidism -Continue synthroid  Hyperlipidemia -Continue statin, Zetia  Diabetes mellitus, type II -Hemoglobin A1c 7 -metformin held  Procedures: Left heart catheterization and coronary angiography Echocardiogram  Consultations: Cardiology  Discharge Exam: Vitals:   11/07/18 1508 11/07/18 1511  BP: (!) 99/56 113/64  Pulse: 81   Resp: 20   Temp:    SpO2: 97%      General: Well developed, well nourished, NAD, appears stated age  HEENT: NCAT,mucous membranes moist.  Neck: Supple  Cardiovascular: S1 S2 auscultated, RRR, no murmur  Respiratory: Clear to auscultation bilaterally   Abdomen: Soft, nontender, nondistended, + bowel sounds  Extremities: warm dry without cyanosis clubbing or edema  Neuro: AAOx3, nonfocal  Psych: Normal affect and demeanor  Discharge Instructions Discharge Instructions    Amb Referral to Cardiac Rehabilitation   Complete by: As directed    Diagnosis:  Coronary Stents NSTEMI     After initial evaluation and assessments completed: Virtual Based Care may be provided alone or in conjunction with Phase 2 Cardiac Rehab  based  on patient barriers.: Yes   Discharge instructions   Complete by: As directed    Patient will be discharged to home.  Patient will need to follow up with primary care provider within one week of discharge, repeat CBC and BMP.  Follow up with cardiology. Patient should continue medications as prescribed.  Patient should follow a heart healthy/carb modified diet.     Allergies as of 11/07/2018      Reactions   Morphine Nausea And Vomiting   Penicillins Swelling, Other (See Comments)   Severe arm swelling and redness Did it involve swelling of the face/tongue/throat, SOB, or low BP? No Did it involve sudden or severe rash/hives, skin peeling, or any reaction on the inside of your mouth or nose? No Did you need to seek medical attention at a hospital or doctor's office? No When did it last happen?30 years If all above answers are "NO", may proceed with cephalosporin use.   Reclast [zoledronic Acid]    "nausea and vomiting. Could not get out of bed.      Medication List    STOP taking these medications   ranitidine 150 MG capsule Commonly known as: ZANTAC Replaced by: famotidine 20 MG tablet     TAKE these medications   aspirin 81 MG EC tablet Take 1 tablet (81 mg total) by mouth daily. Start taking on: November 08, 2018   atorvastatin 40 MG tablet Commonly known as: LIPITOR Take 1 tablet (40 mg total) by mouth daily at 6 PM.   diclofenac sodium 1 % Gel Commonly known as: VOLTAREN Apply 1 application topically at bedtime.   estradiol 0.1 MG/GM vaginal cream Commonly known as: ESTRACE Place 1 Applicatorful vaginally See admin instructions. Every other night   ezetimibe 10 MG tablet Commonly known as: ZETIA Take 10 mg by mouth daily.   famotidine 20 MG tablet Commonly known as: PEPCID Take 1 tablet (20 mg total) by mouth 2 (two) times daily. Replaces: ranitidine 150 MG capsule   ferrous sulfate 325 (65 FE) MG tablet Commonly known as: FerrouSul Take 1 tablet (325 mg  total) 3 (three) times daily with meals by mouth. What changed: when to take this   furosemide 40 MG tablet Commonly known as: LASIX Take 1 tablet (40 mg total) by mouth every morning. & 40 mg in the afternoon Start taking on: November 08, 2018 What changed: how much to take   Hair/Skin/Nails/Biotin Tabs Take 1 tablet by mouth daily.   levothyroxine 100 MCG tablet Commonly known as: SYNTHROID Take 100 mcg by mouth daily before breakfast.   losartan 25 MG tablet Commonly known as: COZAAR Take 1 tablet (25 mg total) by mouth daily.   metFORMIN 500 MG tablet Commonly known as: GLUCOPHAGE Take 1 tablet (500 mg total) by mouth 2 (two) times daily with a meal. Start taking on: November 10, 2018 What changed: These instructions start on November 10, 2018. If you are unsure what to do until then, ask your doctor or other care provider.   metolazone 5 MG tablet Commonly known as: ZAROXOLYN Take 1 tablet as needed for swelling   metoprolol succinate 50 MG 24 hr tablet Commonly known as: TOPROL-XL Take 1 tablet (50 mg total) by mouth daily.   MULTIVITAMIN PO Take 1 tablet by mouth daily.   nitrofurantoin (macrocrystal-monohydrate) 100 MG capsule Commonly known as: MACROBID Take 100 mg by mouth at bedtime.   potassium chloride SA 20 MEQ tablet Commonly known as: K-DUR Take 1 tablet (20  mEq total) by mouth daily after supper.   psyllium 58.6 % powder Commonly known as: METAMUCIL Take 1 packet by mouth at bedtime.   sertraline 100 MG tablet Commonly known as: ZOLOFT Take 50 mg by mouth at bedtime.   spironolactone 25 MG tablet Commonly known as: ALDACTONE Take 0.5 tablets (12.5 mg total) by mouth daily.   SYSTANE OP Place 1 drop into both eyes 4 (four) times daily as needed (for burning eyes).   ticagrelor 90 MG Tabs tablet Commonly known as: BRILINTA Take 1 tablet (90 mg total) by mouth 2 (two) times daily.   vitamin C 500 MG tablet Commonly known as: ASCORBIC ACID Take 500  mg by mouth 2 (two) times daily.   Vitamin D3 125 MCG (5000 UT) Tabs Take 5,000 Units by mouth daily.   VITAMIN E PO Take 1 tablet by mouth daily.      Allergies  Allergen Reactions  . Morphine Nausea And Vomiting  . Penicillins Swelling and Other (See Comments)    Severe arm swelling and redness Did it involve swelling of the face/tongue/throat, SOB, or low BP? No Did it involve sudden or severe rash/hives, skin peeling, or any reaction on the inside of your mouth or nose? No Did you need to seek medical attention at a hospital or doctor's office? No When did it last happen?30 years If all above answers are "NO", may proceed with cephalosporin use.  Erin Herman. Reclast [Zoledronic Acid]     "nausea and vomiting. Could not get out of bed.   Follow-up Information    Berton BonHammond, Janine, NP Follow up.   Specialty: Cardiology Why: Cardiology hospital follow up on 12/05/2018 at 1:00. Please arrive 15 minutes early for check in.  [facility] in 1 week. Between 7:30 and 4 pm.  Contact information: [address] [place] [address] [phone]        Kirstie PeriShah, Ashish, MD. Schedule an appointment as soon as possible for a visit in 1 week(s).   Specialty: Internal Medicine Why: Hospital follow up Contact information: [address]  [place] [address] [phone]        Jonelle SidleMcDowell, Samuel G, MD .   Specialty: Cardiology Contact information: [address] [place] [address] [phone]            The results of significant diagnostics from this hospitalization (including imaging, microbiology, ancillary and laboratory) are listed below for reference.    Significant Diagnostic Studies: No results found.  Microbiology: Recent Results (from the past 240 hour(s))  SARS Coronavirus 2 (CEPHEID - Performed in [facility] lab), Hosp Order     Status: None   Collection Time: 11/04/18  9:29 PM   Specimen: Nasopharyngeal  Swab  Result Value Ref Range Status   SARS Coronavirus 2 NEGATIVE NEGATIVE Final    Comment: (NOTE) If result is NEGATIVE SARS-CoV-2 target nucleic acids are NOT DETECTED. The SARS-CoV-2 RNA is generally detectable in upper and lower  respiratory specimens during the acute phase of infection. The lowest  concentration of SARS-CoV-2 viral copies this assay can detect is 250  copies / mL. A negative result does not preclude SARS-CoV-2 infection  and should not be used as the sole basis for treatment or other  patient management decisions.  A negative result may occur with  improper specimen collection / handling, submission of specimen other  than nasopharyngeal swab, presence of viral mutation(s) within the  areas  targeted by this assay, and inadequate number of viral copies  (<250 copies / mL). A negative result must be combined with clinical  observations, patient history, and epidemiological information. If result is POSITIVE SARS-CoV-2 target nucleic acids are DETECTED. The SARS-CoV-2 RNA is generally detectable in upper and lower  respiratory specimens dur ing the acute phase of infection.  Positive  results are indicative of active infection with SARS-CoV-2.  Clinical  correlation with patient history and other diagnostic information is  necessary to determine patient infection status.  Positive results do  not rule out bacterial infection or co-infection with other viruses. If result is PRESUMPTIVE POSTIVE SARS-CoV-2 nucleic acids MAY BE PRESENT.   A presumptive positive result was obtained on the submitted specimen  and confirmed on repeat testing.  While 2019 novel coronavirus  (SARS-CoV-2) nucleic acids may be present in the submitted sample  additional confirmatory testing may be necessary for epidemiological  and / or clinical management purposes  to differentiate between  SARS-CoV-2 and other Sarbecovirus currently known to infect humans.  If clinically indicated  additional testing with an alternate test  methodology [phone]([id number]) is advised. The SARS-CoV-2 RNA is generally  detectable in upper and lower respiratory sp ecimens during the acute  phase of infection. The expected result is Negative. Fact Sheet for Patients:  BoilerBrush.com.cyhttps://www.fda.gov/media/136312/download Fact Sheet for Healthcare Providers: https://pope.com/https://www.fda.gov/media/136313/download This test is not yet approved or cleared by the [place] FDA and has been authorized for detection and/or diagnosis of SARS-CoV-2 by FDA under an Emergency Use Authorization (EUA).  This EUA will remain in effect (meaning this test can be used) for the duration of the COVID-19 declaration under Section 564(b)(1) of the Act, 21 U.S.C. section 360bbb-3(b)(1), unless the authorization is terminated or revoked sooner. Performed at [facility], [address] [place], [place] [address]      Labs: Basic Metabolic Panel: Recent Labs  Lab 11/05/18 0419 11/06/18 1052 11/07/18 0620 11/07/18 1500  NA 137 138 138  --   K 3.1* 3.4* 2.7* 4.1  CL 95* 99 99  --   CO2 28 28 26   --   GLUCOSE 135* 125* 132*  --   BUN 25* 20 17  --   CREATININE 0.99 1.02* 0.98  --   CALCIUM 9.5 8.9 8.4*  --   MG 1.7  --  1.9  --    Liver Function Tests: No results for input(s): AST, ALT, ALKPHOS, BILITOT, PROT, ALBUMIN in the last 168 hours. No results for input(s): LIPASE, AMYLASE in the last 168 hours. No results for input(s): AMMONIA in the last 168 hours. CBC: Recent Labs  Lab 11/05/18 0419 11/06/18 0609 11/07/18 0620  WBC 9.4 6.3 6.8  HGB 11.9* 11.3* 11.2*  HCT 34.7* 34.1* 33.0*  MCV 92.8 95.8 93.5  PLT 256 252 242   Cardiac Enzymes: No results for input(s): CKTOTAL, CKMB, CKMBINDEX, TROPONINI in the last 168 hours. BNP: BNP (last 3 results) Recent Labs    11/05/18 0419  BNP 272.5*    ProBNP (last 3 results) No results for input(s): PROBNP in the last 8760 hours.  CBG: Recent Labs  Lab  11/06/18 1141 11/06/18 1611 11/06/18 2152 11/07/18 0732 11/07/18 1129  GLUCAP 93 93 121* 129* 103*       Signed:  Aveer Bartow  [facility] 11/07/2018, 4:52 PM

## 2018-11-07 NOTE — TOC Benefit Eligibility Note (Signed)
Transition of Care [facility]) Benefit Eligibility Note    Patient Details  Name: Erin Herman MRN: [id number] Date of Birth: 1927/11/20   Medication/Dose: Brlinta 90 mg Route: Oral Frequency: 2 times daily  Covered?: Yes  Tier: 2 Drug  Prescription Coverage Preferred Pharmacy: [facility]  Spoke with Person/Company/Phone Number:: Jinny Sanders  Co-Pay: 45.00 for retail or Mail Order 60 tab 30 days  Prior Approval: No  Deductible: (No deductible)       Orbie Pyo Phone Number: 11/07/2018, 12:35 PM

## 2018-11-07 NOTE — Progress Notes (Signed)
CARDIAC REHAB PHASE I   PRE:  Rate/Rhythm: 81 SR  BP:  Supine: 133/71  Sitting:   Standing:    SaO2: 99%RA  MODE:  Ambulation: 204 ft   POST:  Rate/Rhythm: 120 ST  BP:  Supine:   Sitting: 134/71  Standing:    SaO2: 100%Ra 0905-1022 Pt was wet when I entered room. Purewick had dislodged and bed wet also. Assisted pt to bathroom and helped her get cleaned up . NT changed her bed. Pt walked 204 ft on RA with gait belt use, rolling walker and asst x1. Encouraged pt to stay close to walker. Uses cane at home but has walker if needed. Pt a little wobbly and stated she has fallen twice and hit face within the past year. PT to see pt before discharge and can make home recommendations. Encouraged pt to walk short distances in house and level area because did not want any falls. Now on brilinta.  Stressed importance of brilinta with stent. Reviewed NTG use, MI restrictions, heart healthy food choices and low carb, and CRP 2. Attended CRP 2 in past. Referred to [place]. Did not discuss virtual App as pt not appropriate.   Graylon Good, RN BSN  11/07/2018 10:18 AM

## 2018-11-07 NOTE — Evaluation (Signed)
Physical Therapy Evaluation Patient Details Name: Erin Herman MRN: [id number] DOB: [birth year] Today's Date: 11/07/2018   History of Present Illness  Patient is a 85 y/o female presenting with chest pain. Past medical history significant of coronary artery disease, ischemic cardiomyopathy with EF of 45 to 50%, diabetes hypertension, hyperlipidemia, aortic valve disease with prosthetic valve in place, diastolic dysfunction and diabetes. Patient admitted with NSTEMI with further work-up.    Clinical Impression  Erin Herman is a very pleasant 85 y/o female admitted with the above listed diagnosis. Patient reports Mod I with mobility and ADLs prior to admission. Patient today performing functional mobility at general min guard level for safety - no LOB or need for physical assist with mobility. Patient feels as if she is at her baseline level of functioning. No further acute PT needs identified. PT to sign off.      Follow Up Recommendations No PT follow up    Equipment Recommendations  None recommended by PT    Recommendations for Other Services       Precautions / Restrictions Precautions Precautions: Fall Restrictions Weight Bearing Restrictions: No      Mobility  Bed Mobility Overal bed mobility: Modified Independent                Transfers Overall transfer level: Needs assistance   Transfers: Sit to/from Stand Sit to Stand: Min guard;Supervision         General transfer comment: for safety and immediate standing balance  Ambulation/Gait Ambulation/Gait assistance: Min guard Gait Distance (Feet): 250 Feet Assistive device: (patient holding onto PT forearm) Gait Pattern/deviations: Step-through pattern;Decreased stride length;Trunk flexed Gait velocity: decreased   General Gait Details: steady pace of gait without LOB; use of PT forearm for stability - patient typically does this with daughter and husband  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)       Balance Overall balance assessment: Mild deficits observed, not formally tested                                           Pertinent Vitals/Pain Pain Assessment: No/denies pain    Home Living Family/patient expects to be discharged to:: Private residence Living Arrangements: Spouse/significant other;Children Available Help at Discharge: Available 24 hours/day;Available PRN/intermittently Type of Home: House Home Access: Stairs to enter Entrance Stairs-Rails: Right Entrance Stairs-Number of Steps: 2 Home Layout: One level Home Equipment: Cane - single point      Prior Function Level of Independence: Independent with assistive device(s)         Comments: daughter is able to provide groceries and drive to MD appts     Hand Dominance        Extremity/Trunk Assessment   Upper Extremity Assessment Upper Extremity Assessment: Defer to OT evaluation    Lower Extremity Assessment Lower Extremity Assessment: Overall WFL for tasks assessed    Cervical / Trunk Assessment Cervical / Trunk Assessment: Kyphotic  Communication   Communication: No difficulties  Cognition Arousal/Alertness: Awake/alert Behavior During Therapy: WFL for tasks assessed/performed Overall Cognitive Status: Within Functional Limits for tasks assessed  General Comments      Exercises     Assessment/Plan    PT Assessment Patent does not need any further PT services  PT Problem List         PT Treatment Interventions      PT Goals (Current goals can be found in the Care Plan section)  Acute Rehab PT Goals Patient Stated Goal: return home today PT Goal Formulation: All assessment and education complete, DC therapy    Frequency     Barriers to discharge        Co-evaluation               AM-PAC PT "6 Clicks" Mobility  Outcome Measure Help  needed turning from your back to your side while in a flat bed without using bedrails?: None Help needed moving from lying on your back to sitting on the side of a flat bed without using bedrails?: None Help needed moving to and from a bed to a chair (including a wheelchair)?: A Little Help needed standing up from a chair using your arms (e.g., wheelchair or bedside chair)?: A Little Help needed to walk in hospital room?: A Little Help needed climbing 3-5 steps with a railing? : A Little 6 Click Score: 20    End of Session Equipment Utilized During Treatment: Gait belt Activity Tolerance: Patient tolerated treatment well Patient left: in bed;with call bell/phone within reach Nurse Communication: Mobility status PT Visit Diagnosis: Unsteadiness on feet (R26.81)    Time: [id number] PT Time Calculation (min) (ACUTE ONLY): 16 min   Charges:   PT Evaluation $PT Eval Moderate Complexity: 1 Mod          Erin Herman, PT, DPT Supplemental Physical Therapist 11/07/18 2:01 PM Pager: [phone] Office: [phone]

## 2018-11-07 NOTE — Progress Notes (Signed)
Progress Note  Patient Name: Desirre Cindric Date of Encounter: 11/07/2018  Primary Cardiologist: Nona DellSamuel McDowell, MD   Subjective   Pt denies CP or dyspnea  Inpatient Medications    Scheduled Meds: . aspirin EC  81 mg Oral Daily  . atorvastatin  40 mg Oral q1800  . cholecalciferol  5,000 Units Oral Daily  . ezetimibe  10 mg Oral Daily  . famotidine  20 mg Oral BID  . ferrous sulfate  325 mg Oral TID WC  . heparin  5,000 Units Subcutaneous Q8H  . insulin aspart  0-9 Units Subcutaneous TID WC  . levothyroxine  100 mcg Oral Q0600  . metoprolol succinate  50 mg Oral Daily  . multivitamin with minerals  1 tablet Oral Daily  . sertraline  50 mg Oral QHS  . sodium chloride flush  3 mL Intravenous Q12H  . sodium chloride flush  3 mL Intravenous Q12H  . spironolactone  12.5 mg Oral Daily  . ticagrelor  90 mg Oral BID   Continuous Infusions: . sodium chloride Stopped (11/05/18 1915)  . sodium chloride     PRN Meds: sodium chloride, acetaminophen, ondansetron (ZOFRAN) IV, sodium chloride flush   Vital Signs    Vitals:   11/06/18 2054 11/06/18 2154 11/07/18 0454 11/07/18 0554  BP: 120/65 124/70 127/61 122/67  Pulse: 84 92 76 76  Resp: 13 13 18 14   Temp:   98.2 F (36.8 C)   TempSrc:   Oral   SpO2: 97% 97% 97% 97%  Weight:   59.4 kg   Height:        Intake/Output Summary (Last 24 hours) at 11/07/2018 0814 Last data filed at 11/07/2018 0600 Gross per 24 hour  Intake 653.75 ml  Output 2600 ml  Net -1946.25 ml   Last 3 Weights 11/07/2018 11/06/2018 11/05/2018  Weight (lbs) 130 lb 15.3 oz 131 lb 14.4 oz 130 lb 12.8 oz  Weight (kg) 59.4 kg 59.829 kg 59.33 kg      Telemetry    Sinus with PVCs- Personally Reviewed   Physical Exam   GEN: No acute distress.  WD WN Neck: supple Cardiac: RRR Respiratory: CTA GI: Soft, NT/ND MS: No edema; radial cath site with mild ecchymosis. Neuro:  Grossly intact Psych: Normal affect   Labs    High Sensitivity Troponin:    Recent Labs  Lab 11/04/18 2142 11/04/18 2328 11/05/18 0419 11/05/18 1041  TROPONINIHS 518* 577* 361* 397*      Chemistry Recent Labs  Lab 11/05/18 0419 11/06/18 1052 11/07/18 0620  NA 137 138 138  K 3.1* 3.4* 2.7*  CL 95* 99 99  CO2 28 28 26   GLUCOSE 135* 125* 132*  BUN 25* 20 17  CREATININE 0.99 1.02* 0.98  CALCIUM 9.5 8.9 8.4*  GFRNONAA 51* 49* 51*  GFRAA 59* 56* 59*  ANIONGAP 14 11 13      Hematology Recent Labs  Lab 11/05/18 0419 11/06/18 0609 11/07/18 0620  WBC 9.4 6.3 6.8  RBC 3.74* 3.56* 3.53*  HGB 11.9* 11.3* 11.2*  HCT 34.7* 34.1* 33.0*  MCV 92.8 95.8 93.5  MCH 31.8 31.7 31.7  MCHC 34.3 33.1 33.9  RDW 11.9 12.0 12.1  PLT 256 252 242    BNP Recent Labs  Lab 11/05/18 0419  BNP 272.5*    Patient Profile     Brenya Hutchersonis a 85 y.o.femalewith a hx of CAD and AS s/p CABG/TAVR (2006), HFrEF, HTN, DM, CKD, depression, HTN, who [facility]  [facility]  Assessment & Plan    1 non-ST elevation myocardial infarction-patient now status post PCI of left circumflex.  Continue aspirin, Brilinta and statin.  Continue beta-blocker.  Repeat echocardiogram to assess LV function status post myocardial infarction.    2 chronic combined systolic/diastolic congestive heart failure-patient remains euvolemic on examination.  We will hold Lasix today and resume home dose tomorrow morning.  3 history of aortic valve replacement-echocardiogram March 2020 showed ejection fraction 45 to 50% and normally functioning aortic valve with mean gradient 10 mmHg.  4 hyperlipidemia-continue lipitor.  5 hypokalemia-supplement.  Resume home dose of KCl tomorrow.  Ambulate this afternoon.  If patient feels well she can be discharged from a cardiac standpoint.  Follow-up with Dr. Domenic Polite 2 to 4 weeks in [place].  Check bmet 1 week following discharge.  As outlined above resume Lasix and potassium at home dose starting tomorrow.  For questions or  updates, please contact [place] Please consult [email or web address] for contact info under        Signed, Kirk Ruths, MD  11/07/2018, 8:14 AM

## 2018-11-09 ENCOUNTER — Telehealth: Payer: Self-pay | Admitting: Cardiology

## 2018-11-09 DIAGNOSIS — I5032 Chronic diastolic (congestive) heart failure: Secondary | ICD-10-CM

## 2018-11-09 MED ORDER — FUROSEMIDE 40 MG PO TABS: ORAL_TABLET | ORAL | 11 refills | Status: DC

## 2018-11-09 NOTE — Telephone Encounter (Signed)
Discharge summary has lasix 40 mg BID

## 2018-11-09 NOTE — Telephone Encounter (Signed)
I spoke with daughter, they will make medication changes.I mailed lab slip

## 2018-11-09 NOTE — Telephone Encounter (Signed)
Stint place on Monday and discharge instructions told them to contact doctor if patient had weight gain.  November 08, 2018 127 lbs  November 09, 2018 134 lbs  They changed the way that she takes her lasix at discharge

## 2018-11-09 NOTE — Telephone Encounter (Signed)
    Covering for Dr. Domenic Polite - By review of records the cardiology service recommended "resuming her home dose at discharge" and by her last office note with Dr. Domenic Polite she was taking 60mg  in AM/40mg  in PM. Would recommend increasing from 40mg  BID to 60mg  in AM/40mg  in PM. Continue to follow daily weights and report back if this does not improve. Would go ahead and take an extra 20mg  today. She should be having repeat labs next week as these were ordered at the time of discharge so please remind her to have these drawn.   Signed, Erma Heritage, PA-C 11/09/2018, 12:21 PM Pager: [phone]

## 2018-11-10 ENCOUNTER — Telehealth: Payer: Self-pay | Admitting: *Deleted

## 2018-11-10 NOTE — Telephone Encounter (Signed)
Pt daughter had stent placement on 7/11 - woke up this morning with blue "knot/bubble" on the inside of her L leg that is sore to the touch and this size of a quarter. Doesn't remember hitting it on anything - denies any other symptoms - did call yesterday regarding retaining fluid and lasix was increased.

## 2018-11-10 NOTE — Telephone Encounter (Signed)
    By review of her cardiac catheterization report, this was performed along her wrist. If this was documented incorrectly and was performed along her groin site, would recommend coming to the office and assessing for a hematoma. If cath was performed along her wrist and did not involve her left leg, would recommend that she mark out the borders along the site to make sure this does not continue to enlarge. If increasing in size or worsening pain, would recommend evaluation with her PCP as her scheduled follow-up is not until 11/2018.  Signed, Erma Heritage, PA-C 11/10/2018, 11:16 AM Pager: [phone]

## 2018-11-10 NOTE — Telephone Encounter (Signed)
Pt daughter took pt to pcp who said it was superficial blood clot and to continue to monitor - also says that cath site was in the wrist and not leg.

## 2018-11-16 ENCOUNTER — Telehealth: Payer: Self-pay | Admitting: *Deleted

## 2018-11-16 NOTE — Telephone Encounter (Signed)
-----   Message from Satira Sark, MD sent at 11/16/2018 11:21 AM EDT ----- Results reviewed.  Lab work compared to recent check approximately 10 days ago.  Potassium has normalized.  Creatinine has bumped up to 1.19 although her diuretics have increased as well.  I reviewed the chart.  Keep planned follow-up.

## 2018-11-16 NOTE — Telephone Encounter (Signed)
Patient informed. Copy sent to PCP °

## 2018-12-05 ENCOUNTER — Other Ambulatory Visit: Payer: Self-pay

## 2018-12-05 ENCOUNTER — Encounter: Payer: Self-pay | Admitting: Cardiology

## 2018-12-05 ENCOUNTER — Ambulatory Visit: Payer: Medicare Other | Admitting: Cardiology

## 2018-12-05 ENCOUNTER — Other Ambulatory Visit: Payer: Self-pay | Admitting: Cardiology

## 2018-12-05 VITALS — BP 94/48 | HR 77 | Temp 98.3°F | Ht 64.0 in | Wt 134.0 lb

## 2018-12-05 DIAGNOSIS — Z953 Presence of xenogenic heart valve: Secondary | ICD-10-CM | POA: Diagnosis not present

## 2018-12-05 DIAGNOSIS — N183 Chronic kidney disease, stage 3 unspecified: Secondary | ICD-10-CM

## 2018-12-05 DIAGNOSIS — E785 Hyperlipidemia, unspecified: Secondary | ICD-10-CM | POA: Diagnosis not present

## 2018-12-05 DIAGNOSIS — I252 Old myocardial infarction: Secondary | ICD-10-CM | POA: Diagnosis not present

## 2018-12-05 DIAGNOSIS — I5042 Chronic combined systolic (congestive) and diastolic (congestive) heart failure: Secondary | ICD-10-CM | POA: Diagnosis not present

## 2018-12-05 DIAGNOSIS — E1122 Type 2 diabetes mellitus with diabetic chronic kidney disease: Secondary | ICD-10-CM

## 2018-12-05 DIAGNOSIS — I10 Essential (primary) hypertension: Secondary | ICD-10-CM

## 2018-12-05 MED ORDER — LOSARTAN POTASSIUM 25 MG PO TABS: 25.0000 mg | ORAL_TABLET | Freq: Every day | ORAL | 3 refills | Status: DC

## 2018-12-05 NOTE — Telephone Encounter (Signed)
Erin Herman (daughter) called stating that she went to [facility] to pick up losartan (COZAAR) 25 MG tablet  [facility] told her that they were waiting on approval from our office.  Please call daughter [phone] to let her know if medication can be refilled or discontinued.  [phone] (she is going out of town tomorrow)

## 2018-12-05 NOTE — Telephone Encounter (Signed)
Notified daughter [name]) - sent to [facility] now.    No request sent to [place] from [facility] previously today.

## 2018-12-05 NOTE — Progress Notes (Signed)
Cardiology Office Note:    Date:  12/05/2018   ID:  Erin Herman, DOB 05-10-1927, MRN [id number]  PCP:  Kirstie PeriShah, Ashish, MD  Cardiologist:  Nona DellSamuel McDowell, MD  Referring MD: Kirstie PeriShah, Ashish, MD   Chief Complaint  Patient presents with  . Hospitalization Follow-up    MI and PCI    History of Present Illness:    Erin Herman is a 85 y.o. female with a past medical history significant for CAD and AS status post CABG/TAVR (2006), chronic systolic heart failure, hypertension, diabetes type 2, CKD, depression, hypertension.  The patient was recently admitted to the hospital 11/04/2018 with non-STEMI.  [facility] cardiac catheter[facility] which found a widely patent LIMA-LAD graft and chronically occluded SVG to left circumflex.  She underwent successful PCI with DES to the circumflex.  She was placed on aspirin and ticagrelor for at least 12 months.  Recommendation for discontinuation of aspirin after 3 months could be considered to reduce bleeding risk.  She has residual OM1 disease which will be treated medically.  PCI could be considered if she develops refractory angina despite maximal tolerated antianginal therapy.  Limited echo revealed EF decreased to 30-35% from prior 45-50%.  Per review of notes, there appears to have been some confusion about her Lasix dosing.  Cardiology recommended to resume her home dosing which was 60 mg in the morning and 40 in the evening, however the patient was discharged on 40 mg twice a day.  She called the office to report increased weight.  She was instructed to go back to her 60 mg in the morning and 40 mg in the evening on 7/16.  Today she says that she has been taking the 60/40 all along.   Follow-up labs done on 7/22 showed mildly increased serum creatinine at 1.19, potassium 3.7.  The patient is here today for hospital follow-up with her daughter. She is having no chest pian/pressure,    She is mildly short of breath when she moves around too fast, long standing. Her breathing is better since stent placement.  No orthopnea, PND, She has trace puffiness of bil ankles.   Her BP is on the low side but she says is normal for her. She denies lightheadedness or dizziness. Overall she is feeling better since the stent.   She lives with her 83 years old husband. She works in her rose garden every morning while her husband works in his garden.   Left wrist cath site well healed. She complains of easy bruising and has a few superficial tiny hematomas on her legs.     Past Medical History:  Diagnosis Date  . Anemia   . Anxiety   . Aortic stenosis    Bioprosthetic AVR 2006  . Arthritis    oa  . CAD (coronary artery disease), native coronary artery    Multivessel status post CABG 2006  . Cardiomyopathy (HCC)   . Chronic kidney disease   . Depression   . Diabetes (HCC) 02/27/2018  . Dysphagia 05/10/2018  . Essential hypertension   . Family history of adverse reaction to anesthesia    daughter ponv  . GERD (gastroesophageal reflux disease)   . Headache    none recent  . History of hiatal hernia   . HOH (hard of hearing)    both ears  . LBBB (left bundle branch block)   . Mixed hyperlipidemia   . Postoperative nausea and vomiting  Morphine  . Type 2 diabetes mellitus (HCC)   . UTI (lower urinary tract infection)     Past Surgical History:  Procedure Laterality Date  . ABDOMINAL HYSTERECTOMY     partial  . AORTIC VALVE REPLACEMENT  2006   Dr. Cornelius Moras - 23 mm Toronto stentless cadaver valve  . CORONARY ARTERY BYPASS GRAFT  2006    Dr. Cornelius Moras - LIMA to LAD, SVG to circumflex  . CORONARY STENT INTERVENTION N/A 11/06/2018   Procedure: CORONARY STENT INTERVENTION;  Surgeon: Yvonne Kendall, MD;  Location: [facility] INVASIVE CV LAB;  Service: Cardiovascular;  Laterality: N/A;  . CORONARY/GRAFT ANGIOGRAPHY N/A 11/06/2018   Procedure: CORONARY/GRAFT ANGIOGRAPHY;  Surgeon: Yvonne Kendall, MD;  Location: [facility] INVASIVE CV LAB;  Service: Cardiovascular;  Laterality: N/A;  . TOTAL HIP ARTHROPLASTY Right 03/01/2017   Procedure: RIGHT TOTAL HIP ARTHROPLASTY ANTERIOR APPROACH;  Surgeon: Durene Romans, MD;  Location: [facility] ORS;  Service: Orthopedics;  Laterality: Right;  70 mins  . TOTAL KNEE ARTHROPLASTY Right 2006  . TOTAL KNEE ARTHROPLASTY Left 05/20/2014   DR Thurston Hole  . TOTAL KNEE ARTHROPLASTY Left 05/20/2014   Procedure: LEFT TOTAL KNEE ARTHROPLASTY;  Surgeon: Nilda Simmer, MD;  Location: [facility] OR;  Service: Orthopedics;  Laterality: Left;    Current Medications: Current Meds  Medication Sig  . aspirin EC 81 MG EC tablet Take 1 tablet (81 mg total) by mouth daily.  Marland Kitchen atorvastatin (LIPITOR) 40 MG tablet Take 1 tablet (40 mg total) by mouth daily at 6 PM.  . Cholecalciferol (VITAMIN D3) 5000 UNITS TABS Take 5,000 Units by mouth daily.  . diclofenac sodium (VOLTAREN) 1 % GEL Apply 1 application topically at bedtime.  Marland Kitchen estradiol (ESTRACE) 0.1 MG/GM vaginal cream Place 1 Applicatorful vaginally See admin instructions. Every other night  . famotidine (PEPCID) 20 MG tablet Take 1 tablet (20 mg total) by mouth 2 (two) times daily.  . ferrous sulfate (FERROUSUL) 325 (65 FE) MG tablet Take 1 tablet (325 mg total) 3 (three) times daily with meals by mouth. (Patient taking differently: Take 325 mg by mouth daily with breakfast. )  . furosemide (LASIX) 40 MG tablet Take 60 mg am and 40 pm late afternoon  . levothyroxine (SYNTHROID) 100 MCG tablet Take 100 mcg by mouth daily before breakfast.   . metFORMIN (GLUCOPHAGE) 500 MG tablet Take 1 tablet (500 mg total) by mouth 2 (two) times daily with a meal.  . metolazone (ZAROXOLYN) 5 MG tablet Take 1 tablet as needed for swelling  . metoprolol succinate (TOPROL-XL) 50 MG 24 hr tablet Take 1 tablet (50 mg total) by mouth daily.  . Multiple Vitamins-Minerals (HAIR/SKIN/NAILS/BIOTIN) TABS Take 1 tablet by mouth daily.  . Multiple  Vitamins-Minerals (MULTIVITAMIN PO) Take 1 tablet by mouth daily.  . nitrofurantoin, macrocrystal-monohydrate, (MACROBID) 100 MG capsule Take 100 mg by mouth at bedtime.   Bertram Gala Glycol-Propyl Glycol (SYSTANE OP) Place 1 drop into both eyes 4 (four) times daily as needed (for burning eyes).   . potassium chloride SA (K-DUR,KLOR-CON) 20 MEQ tablet Take 1 tablet (20 mEq total) by mouth daily after supper.  . psyllium (METAMUCIL) 58.6 % powder Take 1 packet by mouth at bedtime.   . sertraline (ZOLOFT) 100 MG tablet Take 50 mg by mouth at bedtime.   Marland Kitchen spironolactone (ALDACTONE) 25 MG tablet Take 0.5 tablets (12.5 mg total) by mouth daily.  . ticagrelor (BRILINTA) 90 MG TABS tablet Take 1 tablet (90 mg total) by mouth 2 (two) times daily.  Marland Kitchen  vitamin C (ASCORBIC ACID) 500 MG tablet Take 500 mg by mouth 2 (two) times daily.  Marland Kitchen. VITAMIN E PO Take 1 tablet by mouth daily.  . [DISCONTINUED] losartan (COZAAR) 25 MG tablet Take 1 tablet (25 mg total) by mouth daily.     Allergies:   Morphine, Penicillins, and Reclast [zoledronic acid]   Social History   Socioeconomic History  . Marital status: Married    Spouse name: Not on file  . Number of children: Not on file  . Years of education: Not on file  . Highest education level: Not on file  Occupational History  . Not on file  Social Needs  . Financial resource strain: Not on file  . Food insecurity    Worry: Not on file    Inability: Not on file  . Transportation needs    Medical: Not on file    Non-medical: Not on file  Tobacco Use  . Smoking status: Never Smoker  . Smokeless tobacco: Never Used  Substance and Sexual Activity  . Alcohol use: No    Alcohol/week: 0.0 standard drinks  . Drug use: No  . Sexual activity: Not Currently  Lifestyle  . Physical activity    Days per week: Not on file    Minutes per session: Not on file  . Stress: Not on file  Relationships  . Social Musicianconnections    Talks on phone: Not on file    Gets  together: Not on file    Attends religious service: Not on file    Active member of club or organization: Not on file    Attends meetings of clubs or organizations: Not on file    Relationship status: Not on file  Other Topics Concern  . Not on file  Social History Narrative  . Not on file     Family History: The patient's family history includes Cirrhosis in her father; Diabetes in her father and another family member; Heart Problems in her mother. ROS:   Please see the history of present illness.     All other systems reviewed and are negative.  EKGs/Labs/Other Studies Reviewed:    The following studies were reviewed today:  CORONARY STENT INTERVENTION  CORONARY/GRAFT ANGIOGRAPHY 11/06/2018   Conclusions: 1. Severe three-vessel coronary artery disease, including 90% proximal/mid LAD disease, sequential 95% and 50% proximal/mid LCx lesions, long 80% OM1 stenosis, and severe diffuse disease involving non-dominant RCA. 2. Widely patent LIMA-LAD. 3. Chronically occluded SVG-LCx (most likely grafted to OM1). 4. Successful PCI to 95% proximal LCx stenosis using Resolute Onyx 3.5 x 12 mm drug-eluting stent with 0% residual stenosis and TIMI-3 flow.  Recommendations: 1. Dual antiplatelet therapy with aspirin and ticagrelor for at least 12 months.  Discontinuation of aspirin after 3 months could be considered to reduce bleeding risk. 2. Obtain limited echo to evaluate LVEF in the setting of NSTEMI. 3. Aggressive secondary prevention. 4. Medical therapy for OM1 disease.  If the patient has refractory angina despite maximal tolerated antianginal therapy, PCI could be considered. 5. Gentle post-catheterization hydration given chronic kidney disease.  Yvonne Kendallhristopher End, MD   Echocardiogram (limited) 11/07/2018  1. The left ventricle has moderate-severely reduced systolic function, with an ejection fraction of 30-35%. The cavity size was normal. Left ventricular diastolic Doppler parameters  are consistent with restrictive filling. Elevated left atrial and left  ventricular end-diastolic pressures The E/e' is >20. Left ventricular diffuse hypokinesis.  2. The mitral valve is abnormal. Moderate thickening of the mitral valve leaflet. There  is moderate mitral annular calcification present. Mitral valve regurgitation is mild to moderate by color flow Doppler.  3. The tricuspid valve was grossly normal.  4. Aortic valve regurgitation is trivial by color flow Doppler. Moderate stenosis of the aortic valve.  5. A Toronto bioprosthesis valve is present in the aortic position.  6. When compared to the prior study: 07/12/2018: LVEF 45-50%.   EKG:  EKG is not ordered today.    Recent Labs: 11/05/2018: B Natriuretic Peptide 272.5 11/07/2018: BUN 17; Creatinine, Ser 0.98; Hemoglobin 11.2; Magnesium 1.9; Platelets 242; Potassium 4.1; Sodium 138   Recent Lipid Panel    Component Value Date/Time   CHOL 210 (H) 11/04/2018 2142   TRIG 117 11/04/2018 2142   HDL 45 11/04/2018 2142   CHOLHDL 4.7 11/04/2018 2142   VLDL 23 11/04/2018 2142   LDLCALC 142 (H) 11/04/2018 2142    Physical Exam:    VS:  BP (!) 94/48   Pulse 77   Temp 98.3 F (36.8 C)   Ht 5\' 4"  (1.626 m)   Wt 134 lb (60.8 kg)   LMP  (LMP Unknown)   SpO2 98%   BMI 23.00 kg/m     Wt Readings from Last 3 Encounters:  12/05/18 134 lb (60.8 kg)  11/07/18 130 lb 15.3 oz (59.4 kg)  05/10/18 136 lb 9.6 oz (62 kg)     Physical Exam  Constitutional: She is oriented to person, place, and time. She appears well-developed and well-nourished. No distress.  HENT:  Head: Normocephalic and atraumatic.  Neck: Normal range of motion. Neck supple. No JVD present.  Cardiovascular: Normal rate, regular rhythm, normal heart sounds and intact distal pulses. Exam reveals no gallop and no friction rub.  No murmur heard. Pulmonary/Chest: Effort normal and breath sounds normal. No respiratory distress. She has no wheezes. She has no rales.   Abdominal: Soft. Bowel sounds are normal.  Musculoskeletal: Normal range of motion.     Comments: Trace bilateral ankle puffiness  Neurological: She is alert and oriented to person, place, and time.  Skin: Skin is warm and dry.  Psychiatric: She has a normal mood and affect. Her behavior is normal. Judgment and thought content normal.  Vitals reviewed.    ASSESSMENT:    1. Status post non-ST elevation myocardial infarction (NSTEMI)   2. Chronic combined systolic and diastolic heart failure (HCC)   3. Status post aortic valve replacement with bioprosthetic valve   4. Hyperlipidemia, unspecified hyperlipidemia type   5. Essential (primary) hypertension   6. Type 2 diabetes mellitus with stage 3 chronic kidney disease, without long-term current use of insulin (HCC)    PLAN:    In order of problems listed above:  CAD s/p non-STEMI and PCI of the circumflex -Patient has a history of CABG and her LIMA to LAD was widely patent on cath 11/06/18.  SVG to circumflex was chronically occluded and the native artery was stented. -Patient is now on aspirin 81 mg and Brilinta.  Notes indicate that aspirin could be stopped after 3 months to reduce bleeding risk if needed.  Her daughter is anxious for the aspirin to be stopped due to patient having a lot of bruising on her arms and legs.  I will have her follow-up with Dr. Diona BrownerMcDowell in 2 months to make that determination. -Patient is feeling better since stent placement with improved breathing and activity tolerance. -Continue current therapy.  Chronic combined systolic/diastolic heart failure -Limited echo in the hospital post MI showed a  decrease in LV systolic function to 00-86% from 45-50%. -Patient was euvolemic in the hospital but developed increased weight at home.  She is on her prehospital usual dosing of Lasix 60 mg in the morning and 40 mg in the evening. -On metoprolol succinate, Spironolactone and losartan. -Volume looks good today. Pt  asymptomatic. She has some mild ankle puffiness, otherwise good.   History of aortic valve replacement -Echocardiogram in March 2020 showed normally functioning aortic valve with mean gradient of 10 mmHg  Hyperlipidemia -On atorvastatin 40 mg, zetia recently stopped by her PCP -Lipid profile on 11/04/2018 showed TC 210, HDL 45, LDL 142, triglycerides 117 on zetia alone.  -Patient now taking atorvastatin 40 mg.  -Will update lipid panel in 8 weeks prior to follow up.   Hypertension -Soft BP, normal for pt. She tolerates it well.  -Continue current medication -I reviewed symptoms of low blood pressure with patient and daughter and they will notify our office if she develops lightheadedness or intolerance.  Diabetes type 2 -A1c 7.1 on 11/05/2018.  Good control for her age. -Management per PCP  CKD stage III -Creatinine had been up to 1.66 in the hospital.  Follow-up labs 1 week post hospitalization showed serum creatinine of 1.19.  Medication Adjustments/Labs and Tests Ordered: Current medicines are reviewed at length with the patient today.  Concerns regarding medicines are outlined above. Labs and tests ordered and medication changes are outlined in the patient instructions below:  Patient Instructions  Medication Instructions:  Continue all current medications.  Labwork:  FLP, LFT - orders given today.   Please do approximately 1 week prior to next office visit.   Reminder:  Nothing to eat or drink after 12 midnight prior to labs.  Testing/Procedures: none  Follow-Up: 2 months   Any Other Special Instructions Will Be Listed Below (If Applicable).  If you need a refill on your cardiac medications before your next appointment, please call your pharmacy.   ++++++++++++++++++++++++++++++++++++++++++   Lifestyle Modifications to Prevent and Treat Heart Disease -Recommend heart healthy/Mediterranean diet, with whole grains, fruits, vegetables, fish, lean meats, nuts, olive  oil and avocado oil.  -Limit salt intake to less than 1500 mg per day.  -Recommend moderate walking, starting slowly with a few minutes and working up to 3-5 times/week for 30-50 minutes each session. Aim for at least 150 minutes.week. Goal should be pace of 3 miles/hours, or walking 1.5 miles in 30 minutes -Recommend avoidance of tobacco products. Avoid excess alcohol. -Keep blood pressure well controlled, ideally less than 130/80.      Signed, Daune Perch, NP  12/05/2018 5:01 PM    [facility]

## 2018-12-05 NOTE — Patient Instructions (Addendum)
Medication Instructions:  Continue all current medications.  Labwork:  FLP, LFT - orders given today.   Please do approximately 1 week prior to next office visit.   Reminder:  Nothing to eat or drink after 12 midnight prior to labs.  Testing/Procedures: none  Follow-Up: 2 months   Any Other Special Instructions Will Be Listed Below (If Applicable).  If you need a refill on your cardiac medications before your next appointment, please call your pharmacy.   ++++++++++++++++++++++++++++++++++++++++++   Lifestyle Modifications to Prevent and Treat Heart Disease -Recommend heart healthy/Mediterranean diet, with whole grains, fruits, vegetables, fish, lean meats, nuts, olive oil and avocado oil.  -Limit salt intake to less than 1500 mg per day.  -Recommend moderate walking, starting slowly with a few minutes and working up to 3-5 times/week for 30-50 minutes each session. Aim for at least 150 minutes.week. Goal should be pace of 3 miles/hours, or walking 1.5 miles in 30 minutes -Recommend avoidance of tobacco products. Avoid excess alcohol. -Keep blood pressure well controlled, ideally less than 130/80.

## 2018-12-11 ENCOUNTER — Other Ambulatory Visit: Payer: Self-pay | Admitting: *Deleted

## 2018-12-11 MED ORDER — ATORVASTATIN CALCIUM 40 MG PO TABS: 40.0000 mg | ORAL_TABLET | Freq: Every day | ORAL | 2 refills | Status: DC

## 2018-12-22 ENCOUNTER — Ambulatory Visit: Payer: Medicare Other | Admitting: Cardiology

## 2019-01-04 ENCOUNTER — Other Ambulatory Visit: Payer: Self-pay | Admitting: Cardiology

## 2019-01-04 MED ORDER — TICAGRELOR 90 MG PO TABS: 90.0000 mg | ORAL_TABLET | Freq: Two times a day (BID) | ORAL | 6 refills | Status: DC

## 2019-01-04 NOTE — Telephone Encounter (Signed)
°*  STAT* If patient is at the pharmacy, call can be transferred to refill team.   1. Which medications need to be refilled? (please list name of each medication and dose if known) ticagrelor (BRILINTA) 90 MG TABS tablet    2. Which pharmacy/location (including street and city if local pharmacy) is medication to be sent to? [place]   3. Do they need a 30 day or 90 day supply? [place]

## 2019-01-04 NOTE — Telephone Encounter (Signed)
Refilled brilinta to [facility]

## 2019-01-05 ENCOUNTER — Telehealth: Payer: Self-pay | Admitting: Cardiology

## 2019-01-05 NOTE — Telephone Encounter (Signed)
Pt daughter says pt c/o leg and general weakness since starting Lipitor - has been on Lipitor since mid July - was previously on Zetia and no problems with this

## 2019-01-05 NOTE — Telephone Encounter (Signed)
Daughter called stating that since she started taking Lipitor she is feeling very fatigue. Please call Daughter [phone]

## 2019-01-05 NOTE — Telephone Encounter (Signed)
Left message to return call 

## 2019-01-05 NOTE — Telephone Encounter (Signed)
Wanted to add that today she has had 2 episodes of blurred vision today

## 2019-01-06 NOTE — Telephone Encounter (Signed)
I last saw her in December 2019. Chart reviewed. She was stared on Lipitor during hospitalization with NSTEMI and cardiac catheterization in July. At that time her LDL was 142 on Zetia (goal less than 70). Would she consider cutting back to Lipitor 10 mg daily and resume Zetia? That might get her her goal LDL and help reduce potential side effects. If she does not want to do that, then would resume Zetia alone and we can discuss other options at follow-up.

## 2019-01-09 MED ORDER — EZETIMIBE 10 MG PO TABS: 10.0000 mg | ORAL_TABLET | Freq: Every day | ORAL | 1 refills | Status: DC

## 2019-01-09 NOTE — Telephone Encounter (Signed)
Pt prefers to stop Lipitor - will resume Zetia 10 mg daily - f/u as scheduled in October

## 2019-01-31 ENCOUNTER — Telehealth: Payer: Self-pay | Admitting: *Deleted

## 2019-01-31 MED ORDER — ROSUVASTATIN CALCIUM 10 MG PO TABS: 10.0000 mg | ORAL_TABLET | Freq: Every day | ORAL | 3 refills | Status: DC

## 2019-01-31 NOTE — Telephone Encounter (Signed)
Patient informed and agrees to starting crestor 10 mg daily. Rx sent to [facility].

## 2019-01-31 NOTE — Telephone Encounter (Signed)
-----   Message from Daune Perch, NP sent at 01/31/2019  2:27 PM EDT ----- LDL down to 123 from 142, but would like to get it to at least less than 100 (ideally less than 70). She apparently stopped lipitor due to muscle aches and restarted zetia. Since cholesterol still much above goal, would she be willing to try a different statin, low dose of rosuvastatin 10 mg daily? Continue zetia with it. Otherwise she can follow up with Dr. Domenic Polite at 10/29 office visit for further recommendations.   Daune Perch, NP

## 2019-02-06 ENCOUNTER — Other Ambulatory Visit: Payer: Self-pay | Admitting: *Deleted

## 2019-02-06 MED ORDER — ROSUVASTATIN CALCIUM 10 MG PO TABS: 10.0000 mg | ORAL_TABLET | Freq: Every day | ORAL | 3 refills | Status: DC

## 2019-02-22 ENCOUNTER — Ambulatory Visit (INDEPENDENT_AMBULATORY_CARE_PROVIDER_SITE_OTHER): Payer: Medicare Other | Admitting: Cardiology

## 2019-02-22 ENCOUNTER — Other Ambulatory Visit: Payer: Self-pay

## 2019-02-22 ENCOUNTER — Encounter: Payer: Self-pay | Admitting: Cardiology

## 2019-02-22 VITALS — BP 91/49 | HR 74 | Ht 64.0 in | Wt 137.2 lb

## 2019-02-22 DIAGNOSIS — Z953 Presence of xenogenic heart valve: Secondary | ICD-10-CM

## 2019-02-22 DIAGNOSIS — I10 Essential (primary) hypertension: Secondary | ICD-10-CM

## 2019-02-22 DIAGNOSIS — E782 Mixed hyperlipidemia: Secondary | ICD-10-CM

## 2019-02-22 DIAGNOSIS — I25119 Atherosclerotic heart disease of native coronary artery with unspecified angina pectoris: Secondary | ICD-10-CM | POA: Diagnosis not present

## 2019-02-22 MED ORDER — LOSARTAN POTASSIUM 25 MG PO TABS: 12.5000 mg | ORAL_TABLET | Freq: Every day | ORAL | 3 refills | Status: DC

## 2019-02-22 MED ORDER — ROSUVASTATIN CALCIUM 5 MG PO TABS: 5.0000 mg | ORAL_TABLET | Freq: Every day | ORAL | 3 refills | Status: DC

## 2019-02-22 NOTE — Patient Instructions (Addendum)
Medication Instructions:   Your physician has recommended you make the following change in your medication:   Stop aspirin  Decrease crestor to 5 mg daily  Decrease losartan to 12.5 mg daily  Continue all other medications the same  Labwork:  Your physician recommends that you return for a FASTING lipid profile: in 3 months just before your next visit. Please do not eat or drink for at least 8 hours when you have this done. You may take your medications that morning with a sip of water.  Testing/Procedures:  NONE  Follow-Up:  Your physician recommends that you schedule a follow-up appointment in: 3 months.  Any Other Special Instructions Will Be Listed Below (If Applicable).  If you need a refill on your cardiac medications before your next appointment, please call your pharmacy.

## 2019-02-22 NOTE — Progress Notes (Signed)
Cardiology Office Note  Date: 02/22/2019   ID: Erin Herman, DOB 12/05/1927, MRN [id number]  PCP:  Monico Blitz, MD  Cardiologist:  Rozann Lesches, MD Electrophysiologist:  None   Chief Complaint  Patient presents with  . Cardiac follow-up    History of Present Illness: Erin Herman is an 85 y.o. female last seen in August by Ms. Phylliss Bob NP.  She is here today with her daughter.  She does not report any obvious angina symptoms at this point on medical therapy.  She has been concerned about having easy bruising and asked about potentially stopping aspirin at this time.  She also has a superficial hematoma on her leg, possibly related to a fall.  She feels a general lack of energy.  Since last encounter she came off Lipitor reporting side effects and went back to Zetia, although this did not provide optimal LDL control.  She subsequently agreed to start Crestor.  Cardiac catheterization and PCI note from Dr. Saunders Revel indicated potential discontinuation of aspirin after 3 months of procedure.  I reviewed her cardiac medications.  We also discussed cutting Cozaar down to 12.5 mg daily given relatively low blood pressures at baseline.  Her weight has not fluctuated significantly more than just a few pounds.  No ankle edema.  Past Medical History:  Diagnosis Date  . Anemia   . Anxiety   . Aortic stenosis    Bioprosthetic AVR 2006  . CAD (coronary artery disease), native coronary artery    Multivessel status post CABG 2006, occluded SVG to circumflex, DES to circumflex July 2020  . Cardiomyopathy ([place])   . Chronic kidney disease   . Depression   . Dysphagia   . Essential hypertension   . GERD (gastroesophageal reflux disease)   . Headache   . History of hiatal hernia   . HOH (hard of hearing)   . LBBB (left bundle branch block)   . Mixed hyperlipidemia   . NSTEMI (non-ST elevated myocardial infarction) [facility])    July 2020  . Osteoarthritis   . Postoperative nausea  and vomiting    Morphine  . Type 2 diabetes mellitus ([place])   . UTI (lower urinary tract infection)     Past Surgical History:  Procedure Laterality Date  . ABDOMINAL HYSTERECTOMY     partial  . AORTIC VALVE REPLACEMENT  2006   Dr. Roxy Manns - 23 mm Toronto stentless cadaver valve  . CORONARY ARTERY BYPASS GRAFT  2006    Dr. Roxy Manns - LIMA to LAD, SVG to circumflex  . CORONARY STENT INTERVENTION N/A 11/06/2018   Procedure: CORONARY STENT INTERVENTION;  Surgeon: Nelva Bush, MD;  Location: [place] [facility];  Service: Cardiovascular;  Laterality: N/A;  . CORONARY/GRAFT ANGIOGRAPHY N/A 11/06/2018   Procedure: CORONARY/GRAFT ANGIOGRAPHY;  Surgeon: Nelva Bush, MD;  Location: [place] [facility];  Service: Cardiovascular;  Laterality: N/A;  . TOTAL HIP ARTHROPLASTY Right 03/01/2017   Procedure: RIGHT TOTAL HIP ARTHROPLASTY ANTERIOR APPROACH;  Surgeon: Paralee Cancel, MD;  Location: [facility];  Service: Orthopedics;  Laterality: Right;  70 mins  . TOTAL KNEE ARTHROPLASTY Right 2006  . TOTAL KNEE ARTHROPLASTY Left 05/20/2014   DR Noemi Chapel  . TOTAL KNEE ARTHROPLASTY Left 05/20/2014   Procedure: LEFT TOTAL KNEE ARTHROPLASTY;  Surgeon: Lorn Junes, MD;  Location: [place];  Service: Orthopedics;  Laterality: Left;    Current Outpatient Medications  Medication Sig Dispense Refill  . diclofenac sodium (VOLTAREN) 1 % GEL Apply 1 application topically at bedtime.    Marland Kitchen  estradiol (ESTRACE) 0.1 MG/GM vaginal cream Place 1 Applicatorful vaginally See admin instructions. Every other night    . famotidine (PEPCID) 20 MG tablet Take 1 tablet (20 mg total) by mouth 2 (two) times daily. 60 tablet 1  . furosemide (LASIX) 40 MG tablet Take 60 mg am and 40 pm late afternoon 30 tablet 11  . levothyroxine (SYNTHROID) 88 MCG tablet Take 88 mcg by mouth daily before breakfast.    . losartan (COZAAR) 25 MG tablet Take 0.5 tablets (12.5 mg total) by mouth daily. 45 tablet 3  . metFORMIN (GLUCOPHAGE) 500 MG tablet Take 1  tablet (500 mg total) by mouth 2 (two) times daily with a meal.    . metolazone (ZAROXOLYN) 5 MG tablet Take 1 tablet as needed for swelling 30 tablet 0  . metoprolol succinate (TOPROL-XL) 50 MG 24 hr tablet Take 1 tablet (50 mg total) by mouth daily. 90 tablet 3  . Multiple Vitamins-Minerals (HAIR/SKIN/NAILS/BIOTIN) TABS Take 1 tablet by mouth daily.    . Multiple Vitamins-Minerals (MULTIVITAMIN PO) Take 1 tablet by mouth daily.    . nitrofurantoin, macrocrystal-monohydrate, (MACROBID) 100 MG capsule Take 100 mg by mouth at bedtime.     Bertram Gala. Polyethyl Glycol-Propyl Glycol (SYSTANE OP) Place 1 drop into both eyes 4 (four) times daily as needed (for burning eyes).     . potassium chloride SA (K-DUR,KLOR-CON) 20 MEQ tablet Take 1 tablet (20 mEq total) by mouth daily after supper. 90 tablet 0  . sertraline (ZOLOFT) 100 MG tablet Take 50 mg by mouth at bedtime.     Marland Kitchen. spironolactone (ALDACTONE) 25 MG tablet Take 0.5 tablets (12.5 mg total) by mouth daily. 45 tablet 3  . ticagrelor (BRILINTA) 90 MG TABS tablet Take 1 tablet (90 mg total) by mouth 2 (two) times daily. 60 tablet 6  . vitamin C (ASCORBIC ACID) 500 MG tablet Take 500 mg by mouth 2 (two) times daily.    Marland Kitchen. VITAMIN E PO Take 1 tablet by mouth daily.    . rosuvastatin (CRESTOR) 5 MG tablet Take 1 tablet (5 mg total) by mouth daily. 90 tablet 3   No current facility-administered medications for this visit.    Allergies:  Lipitor [atorvastatin], Morphine, Penicillins, and Reclast [zoledronic acid]   Social History: The patient  reports that she has never smoked. She has never used smokeless tobacco. She reports that she does not drink alcohol or use drugs.   ROS:  Please see the history of present illness. Otherwise, complete review of systems is positive for hearing loss, knee swelling.  All other systems are reviewed and negative.   Physical Exam: VS:  BP (!) 91/49   Pulse 74   Ht 5\' 4"  (1.626 m)   Wt 137 lb 3.2 oz (62.2 kg)   LMP  (LMP  Unknown)   SpO2 96%   BMI 23.55 kg/m , BMI Body mass index is 23.55 kg/m.  Wt Readings from Last 3 Encounters:  02/22/19 137 lb 3.2 oz (62.2 kg)  12/05/18 134 lb (60.8 kg)  11/07/18 130 lb 15.3 oz (59.4 kg)    General: Elderly woman, appears comfortable at rest. HEENT: Conjunctiva and lids normal, wearing a mask. Neck: Supple, no elevated JVP or carotid bruits, no thyromegaly. Lungs: Clear to auscultation, nonlabored breathing at rest. Cardiac: Regular rate and rhythm, no S3, soft systolic murmur. Abdomen: Soft, nontender, bowel sounds present, no guarding or rebound. Extremities: No pitting edema, small hematoma with ecchymosis left leg, distal pulses 2+. Skin: Warm  and dry. Musculoskeletal: Mild kyphosis. Neuropsychiatric: Alert and oriented x3, affect grossly appropriate.  ECG:  An ECG dated 11/06/2018 was personally reviewed today and demonstrated:  Sinus rhythm with prolonged PR interval and left bundle branch block.  Recent Labwork: 11/05/2018: B Natriuretic Peptide 272.5 11/07/2018: BUN 17; Creatinine, Ser 0.98; Hemoglobin 11.2; Magnesium 1.9; Platelets 242; Potassium 4.1; Sodium 138     Component Value Date/Time   CHOL 210 (H) 11/04/2018 2142   TRIG 117 11/04/2018 2142   HDL 45 11/04/2018 2142   CHOLHDL 4.7 11/04/2018 2142   VLDL 23 11/04/2018 2142   LDLCALC 142 (H) 11/04/2018 2142    Other Studies Reviewed Today:  Echocardiogram 11/07/2018:  1. The left ventricle has moderate-severely reduced systolic function, with an ejection fraction of 30-35%. The cavity size was normal. Left ventricular diastolic Doppler parameters are consistent with restrictive filling. Elevated left atrial and left  ventricular end-diastolic pressures The E/e' is >20. Left ventricular diffuse hypokinesis.  2. The mitral valve is abnormal. Moderate thickening of the mitral valve leaflet. There is moderate mitral annular calcification present. Mitral valve regurgitation is mild to moderate by  color flow Doppler.  3. The tricuspid valve was grossly normal.  4. Aortic valve regurgitation is trivial by color flow Doppler. Moderate stenosis of the aortic valve.  5. A Toronto bioprosthesis valve is present in the aortic position.  6. When compared to the prior study: 07/12/2018: LVEF 45-50%.  Cardiac catheterization 11/06/2018: Conclusions: 1. Severe three-vessel coronary artery disease, including 90% proximal/mid LAD disease, sequential 95% and 50% proximal/mid LCx lesions, long 80% OM1 stenosis, and severe diffuse disease involving non-dominant RCA. 2. Widely patent LIMA-LAD. 3. Chronically occluded SVG-LCx (most likely grafted to OM1). 4. Successful PCI to 95% proximal LCx stenosis using Resolute Onyx 3.5 x 12 mm drug-eluting stent with 0% residual stenosis and TIMI-3 flow.  Recommendations: 1. Dual antiplatelet therapy with aspirin and ticagrelor for at least 12 months.  Discontinuation of aspirin after 3 months could be considered to reduce bleeding risk. 2. Obtain limited echo to evaluate LVEF in the setting of NSTEMI. 3. Aggressive secondary prevention. 4. Medical therapy for OM1 disease.  If the patient has refractory angina despite maximal tolerated antianginal therapy, PCI could be considered. 5. Gentle post-catheterization hydration given chronic kidney disease.  Assessment and Plan:  1.  Multivessel CAD status post CABG with patent LIMA to LAD but chronically occluded SVG to circumflex as of angiography in July.  She underwent placement of DES to the proximal circumflex in the setting of NSTEMI at that point.  As per Dr. Serita Kyle note, discontinuation of aspirin after 3 months of intervention could be considered, patient is reporting easy bruising and we will stop aspirin for now but continue Brilinta.  2.  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  Continuing medical therapy, do not plan to pursue ICD at this point.  Cozaar will be reduced to 12.5 mg daily in light of low  blood pressures, otherwise continue Lasix, Aldactone, and Toprol-XL as tolerated.  Weight has been relatively stable.  We will eventually plan to follow-up with repeat echocardiogram.  3.  Mixed hyperlipidemia with statin intolerance.  Lipitor has been stopped.  We are going to attempt to transition from Zetia to Crestor 5 mg daily.  If tolerated, recheck FLP in 3 months with office visit.  4.  Aortic valve disease status post bioprosthetic AVR.  Medication Adjustments/Labs and Tests Ordered: Current medicines are reviewed at length with the patient today.  Concerns  regarding medicines are outlined above.   Tests Ordered: Orders Placed This Encounter  Procedures  . Lipid panel    Medication Changes: Meds ordered this encounter  Medications  . losartan (COZAAR) 25 MG tablet    Sig: Take 0.5 tablets (12.5 mg total) by mouth daily.    Dispense:  45 tablet    Refill:  3    02/22/2019 dose decrease  . rosuvastatin (CRESTOR) 5 MG tablet    Sig: Take 1 tablet (5 mg total) by mouth daily.    Dispense:  90 tablet    Refill:  3    02/22/2019 dose decrease    Disposition:  Follow up 3 months in the [place] office.  Signed, Jonelle Sidle, MD, [facility] 02/22/2019 1:33 PM    [facility] [address] [place], [place], [place] [address] Phone: [phone]; Fax: [phone]

## 2019-03-19 ENCOUNTER — Other Ambulatory Visit: Payer: Self-pay | Admitting: Cardiology

## 2019-03-19 MED ORDER — TICAGRELOR 90 MG PO TABS: 90.0000 mg | ORAL_TABLET | Freq: Two times a day (BID) | ORAL | 2 refills | Status: DC

## 2019-03-19 NOTE — Telephone Encounter (Signed)
°*  STAT* If patient is at the pharmacy, call can be transferred to refill team.   1. Which medications need to be refilled? ticagrelor (BRILINTA) 90 MG TABS tablet   2. Which pharmacy/location (including street and city if Management consultant)  [place] mail in pharmacy   3. Do they need a 30 day or 90 day supply?

## 2019-05-08 ENCOUNTER — Ambulatory Visit: Payer: Medicare Other | Admitting: Cardiology

## 2019-05-10 ENCOUNTER — Other Ambulatory Visit: Payer: Self-pay | Admitting: Cardiology

## 2019-05-10 DIAGNOSIS — I5032 Chronic diastolic (congestive) heart failure: Secondary | ICD-10-CM

## 2019-05-10 MED ORDER — LOSARTAN POTASSIUM 25 MG PO TABS: 12.5000 mg | ORAL_TABLET | Freq: Every day | ORAL | 3 refills | Status: DC

## 2019-05-10 MED ORDER — FUROSEMIDE 40 MG PO TABS: ORAL_TABLET | ORAL | 3 refills | Status: DC

## 2019-05-10 NOTE — Telephone Encounter (Signed)
furosemide (LASIX) 40 MG tablet  Please add this prescription to for refill

## 2019-05-10 NOTE — Telephone Encounter (Signed)
*  STAT* If patient is at the pharmacy, call can be transferred to refill team.   1. Which medications need to be refilled?  losartan (COZAAR) 25 MG tablet  Needs cardiac medications are sent there   2. Which pharmacy/location (including street and city if local pharmacy) is medication to be sent to? Mail order pharmacy for [facility] she thinks it is called [facility]  - not sure   3. Do they need a 30 day or 90 day supply?

## 2019-05-10 NOTE — Telephone Encounter (Signed)
Done. Patient notified.

## 2019-05-17 NOTE — Progress Notes (Signed)
Cardiology Office Note  Date: 05/18/2019   ID: Erin Herman, DOB 1927-12-26, MRN [id number]  PCP:  Kirstie Peri, MD  Cardiologist:  Nona Dell, MD Electrophysiologist:  None   Chief Complaint  Patient presents with  . Cardiac follow-up    History of Present Illness: Erin Herman is an 86 y.o. female last seen in October 2020.  She is here today with her daughter.  Feels somewhat better in terms of energy, no active angina symptoms or worsening shortness of breath.  No orthopnea or PND.  At the last visit aspirin was discontinued.  We also reduced Cozaar to 12.5 mg daily.  Trial of Crestor was initiated.  She has not had a follow-up lipid panel as yet.  She states that she has been able to tolerate the Crestor so far.  We discussed getting her set up to have a follow-up echocardiogram for reassessment of LVEF on current medical regimen.  Echocardiogram from July 2020 revealed LVEF 30 to 35%.  Past Medical History:  Diagnosis Date  . Anemia   . Anxiety   . Aortic stenosis    Bioprosthetic AVR 2006  . CAD (coronary artery disease), native coronary artery    Multivessel status post CABG 2006, occluded SVG to circumflex, DES to circumflex July 2020  . Cardiomyopathy (HCC)   . Chronic kidney disease   . Depression   . Dysphagia   . Essential hypertension   . GERD (gastroesophageal reflux disease)   . Headache   . History of hiatal hernia   . HOH (hard of hearing)   . LBBB (left bundle branch block)   . Mixed hyperlipidemia   . NSTEMI (non-ST elevated myocardial infarction) [facility])    July 2020  . Osteoarthritis   . Postoperative nausea and vomiting    Morphine  . Type 2 diabetes mellitus (HCC)   . UTI (lower urinary tract infection)     Past Surgical History:  Procedure Laterality Date  . ABDOMINAL HYSTERECTOMY     partial  . AORTIC VALVE REPLACEMENT  2006   Dr. Cornelius Moras - 23 mm [place] stentless cadaver valve  . CORONARY ARTERY BYPASS GRAFT  2006    Dr. Cornelius Moras - LIMA to LAD, SVG to circumflex  . CORONARY STENT INTERVENTION N/A 11/06/2018   Procedure: CORONARY STENT INTERVENTION;  Surgeon: Yvonne Kendall, MD;  [facility];  Service: Cardiovascular;  Laterality: N/A;  . CORONARY/GRAFT ANGIOGRAPHY N/A 11/06/2018   Procedure: CORONARY/GRAFT ANGIOGRAPHY;  Surgeon: Yvonne Kendall, MD;  [facility]: [facility] IN[facility];  Service: Cardiovascular;  Laterality: N/A;  . TOTAL HIP ARTHROPLASTY Right 03/01/2017   Procedure: RIGHT TOTAL HIP ARTHROPLASTY ANTERIOR APPROACH;  Surgeon: Durene Romans, MD;  Location: [facility];  Service: Orthopedics;  Laterality: Right;  70 mins  . TOTAL KNEE ARTHROPLASTY Right 2006  . TOTAL KNEE ARTHROPLASTY Left 05/20/2014   DR Thurston Hole  . TOTAL KNEE ARTHROPLASTY Left 05/20/2014   Procedure: LEFT TOTAL KNEE ARTHROPLASTY;  Surgeon: Nilda Simmer, MD;  Location: [facility] OR;  Service: Orthopedics;  Laterality: Left;    Current Outpatient Medications  Medication Sig Dispense Refill  . APPLE CIDER VINEGAR PO Take 1 tablet by mouth every morning.    Marland Kitchen CINNAMON PO Take 1 tablet by mouth 2 (two) times daily.    . Coenzyme Q10 (COQ10 PO) Take 1 tablet by mouth daily.    . diclofenac sodium (VOLTAREN) 1 % GEL Apply 1 application topically at bedtime.    Marland Kitchen estradiol (ESTRACE) 0.1  MG/GM vaginal cream Place 1 Applicatorful vaginally See admin instructions. Every other night    . famotidine (PEPCID) 20 MG tablet Take 1 tablet (20 mg total) by mouth 2 (two) times daily. 60 tablet 1  . furosemide (LASIX) 40 MG tablet Take 60 mg am and 40 pm late afternoon 225 tablet 3  . levothyroxine (SYNTHROID) 88 MCG tablet Take 88 mcg by mouth daily before breakfast.    . losartan (COZAAR) 25 MG tablet Take 0.5 tablets (12.5 mg total) by mouth daily. 45 tablet 3  . metFORMIN (GLUCOPHAGE) 500 MG tablet Take 1 tablet (500 mg total) by mouth 2 (two) times daily with a meal.    . metolazone (ZAROXOLYN) 5 MG tablet Take 1 tablet as needed for  swelling 30 tablet 0  . metoprolol succinate (TOPROL-XL) 50 MG 24 hr tablet Take 1 tablet (50 mg total) by mouth daily. 90 tablet 3  . Multiple Vitamins-Minerals (HAIR/SKIN/NAILS/BIOTIN) TABS Take 1 tablet by mouth daily.    . Multiple Vitamins-Minerals (MULTIVITAMIN PO) Take 1 tablet by mouth daily.    . nitrofurantoin, macrocrystal-monohydrate, (MACROBID) 100 MG capsule Take 100 mg by mouth at bedtime.     Bertram Gala Glycol-Propyl Glycol (SYSTANE OP) Place 1 drop into both eyes 4 (four) times daily as needed (for burning eyes).     . potassium chloride SA (K-DUR,KLOR-CON) 20 MEQ tablet Take 1 tablet (20 mEq total) by mouth daily after supper. 90 tablet 0  . rosuvastatin (CRESTOR) 5 MG tablet Take 1 tablet (5 mg total) by mouth daily. 90 tablet 3  . sertraline (ZOLOFT) 100 MG tablet Take 50 mg by mouth at bedtime.     Marland Kitchen spironolactone (ALDACTONE) 25 MG tablet Take 0.5 tablets (12.5 mg total) by mouth daily. 45 tablet 3  . ticagrelor (BRILINTA) 90 MG TABS tablet Take 1 tablet (90 mg total) by mouth 2 (two) times daily. 180 tablet 2  . vitamin C (ASCORBIC ACID) 500 MG tablet Take 500 mg by mouth 2 (two) times daily.    Marland Kitchen VITAMIN E PO Take 1 tablet by mouth daily.     No current facility-administered medications for this visit.   Allergies:  Lipitor [atorvastatin], Morphine, Penicillins, and Reclast [zoledronic acid]   Social History: The patient  reports that she has never smoked. She has never used smokeless tobacco. She reports that she does not drink alcohol or use drugs.   ROS:  Please see the history of present illness. Otherwise, complete review of systems is positive for hearing loss.  All other systems are reviewed and negative.   Physical Exam: VS:  BP 112/61   Pulse 65   Ht 5\' 4"  (1.626 m)   Wt 135 lb 12.8 oz (61.6 kg)   LMP  (LMP Unknown)   SpO2 99%   BMI 23.31 kg/m , BMI Body mass index is 23.31 kg/m.  Wt Readings from Last 3 Encounters:  05/18/19 135 lb 12.8 oz (61.6  kg)  02/22/19 137 lb 3.2 oz (62.2 kg)  12/05/18 134 lb (60.8 kg)    General: Elderly woman, appears comfortable at rest. HEENT: Conjunctiva and lids normal, wearing a mask. Neck: Supple, no elevated JVP or carotid bruits, no thyromegaly. Lungs: Clear to auscultation, nonlabored breathing at rest. Cardiac: Regular rate and rhythm, no S3, soft systolic murmur, no pericardial rub. Abdomen: Soft, nontender, bowel sounds present. Extremities: No pitting edema, distal pulses 2+. Skin: Warm and dry. Musculoskeletal: Kyphosis. Neuropsychiatric: Alert and oriented x3, affect grossly appropriate.  ECG:  An ECG dated 11/06/2018 was personally reviewed today and demonstrated:  Sinus rhythm with prolonged PR interval and left bundle branch block.  Recent Labwork: 11/05/2018: B Natriuretic Peptide 272.5 11/07/2018: BUN 17; Creatinine, Ser 0.98; Hemoglobin 11.2; Magnesium 1.9; Platelets 242; Potassium 4.1; Sodium 138     Component Value Date/Time   CHOL 210 (H) 11/04/2018 2142   TRIG 117 11/04/2018 2142   HDL 45 11/04/2018 2142   CHOLHDL 4.7 11/04/2018 2142   VLDL 23 11/04/2018 2142   LDLCALC 142 (H) 11/04/2018 2142    Other Studies Reviewed Today:  Echocardiogram 11/07/2018: 1. The left ventricle has moderate-severely reduced systolic function, with an ejection fraction of 30-35%. The cavity size was normal. Left ventricular diastolic Doppler parameters are consistent with restrictive filling. Elevated left atrial and left  ventricular end-diastolic pressures The E/e' is >20. Left ventricular diffuse hypokinesis. 2. The mitral valve is abnormal. Moderate thickening of the mitral valve leaflet. There is moderate mitral annular calcification present. Mitral valve regurgitation is mild to moderate by color flow Doppler. 3. The tricuspid valve was grossly normal. 4. Aortic valve regurgitation is trivial by color flow Doppler. Moderate stenosis of the aortic valve. 5. A [place] bioprosthesis  valve is present in the aortic position. 6. When compared to the prior study: 07/12/2018: LVEF 45-50%.  Cardiac catheterization 11/06/2018: Conclusions: 1. Severe three-vessel coronary artery disease, including 90% proximal/mid LAD disease, sequential 95% and 50% proximal/mid LCx lesions, long 80% OM1 stenosis, and severe diffuse disease involving non-dominant RCA. 2. Widely patent LIMA-LAD. 3. Chronically occluded SVG-LCx (most likely grafted to OM1). 4. Successful PCI to 95% proximal LCx stenosis using Resolute Onyx 3.5 x 12 mm drug-eluting stent with 0% residual stenosis and TIMI-3 flow.  Recommendations: 1. Dual antiplatelet therapy with aspirin and ticagrelor for at least 12 months. Discontinuation of aspirin after 3 months could be considered to reduce bleeding risk. 2. Obtain limited echo to evaluate LVEF in the setting of NSTEMI. 3. Aggressive secondary prevention. 4. Medical therapy for OM1 disease. If the patient has refractory angina despite maximal tolerated antianginal therapy, PCI could be considered. 5. Gentle post-catheterization hydration given chronic kidney disease.  Assessment and Plan:  1.  Multivessel CAD status post CABG with graft disease documented at cardiac catheterization July 2020 and status post DES to proximal circumflex.  She reports no active angina at this time.  She continues on Brilinta, aspirin was already discontinued in light of significant bruising.  She is also on Toprol-XL, Cozaar, and Crestor.  Check CBC and BMET.  2.  Secondary cardiomyopathy, LVEF 30 to 35% as of July 2020.  We will obtain a follow-up echocardiogram for reassessment.  Continue Toprol-XL, Cozaar, Aldactone, and Lasix.  She is not a candidate for ICD at age 45.  76.  Mixed hyperlipidemia with history of statin intolerance.  She has been able to take Crestor so far without difficulty.  Follow-up FLP.  4.  Status post bioprosthetic AVR.  Medication Adjustments/Labs and Tests  Ordered: Current medicines are reviewed at length with the patient today.  Concerns regarding medicines are outlined above.   Tests Ordered: Orders Placed This Encounter  Procedures  . Basic metabolic panel  . CBC  . ECHOCARDIOGRAM COMPLETE    Medication Changes: No orders of the defined types were placed in this encounter.   Disposition:  Follow up 3 months in the [place] office.  Signed, Satira Sark, MD, [facility] 05/18/2019 11:37 AM    [place] at [facility]  [place], [place], [place] [address] Phone: [phone]; Fax: [phone]

## 2019-05-18 ENCOUNTER — Other Ambulatory Visit: Payer: Self-pay

## 2019-05-18 ENCOUNTER — Ambulatory Visit: Payer: Medicare Other | Admitting: Cardiology

## 2019-05-18 ENCOUNTER — Encounter: Payer: Self-pay | Admitting: Cardiology

## 2019-05-18 VITALS — BP 112/61 | HR 65 | Ht 64.0 in | Wt 135.8 lb

## 2019-05-18 DIAGNOSIS — I25119 Atherosclerotic heart disease of native coronary artery with unspecified angina pectoris: Secondary | ICD-10-CM | POA: Diagnosis not present

## 2019-05-18 DIAGNOSIS — I429 Cardiomyopathy, unspecified: Secondary | ICD-10-CM

## 2019-05-18 DIAGNOSIS — Z79899 Other long term (current) drug therapy: Secondary | ICD-10-CM

## 2019-05-18 DIAGNOSIS — E782 Mixed hyperlipidemia: Secondary | ICD-10-CM | POA: Diagnosis not present

## 2019-05-18 DIAGNOSIS — I10 Essential (primary) hypertension: Secondary | ICD-10-CM | POA: Diagnosis not present

## 2019-05-18 NOTE — Patient Instructions (Addendum)
Medication Instructions:   Your physician recommends that you continue on your current medications as directed. Please refer to the Current Medication list given to you today.  Labwork: Your physician recommends that you return for a FASTING lipid profile, BMET & CBC as soon as possible. Please do not eat or drink for at least 8 hours when you have this done. You may take your medications that morning with a sip of water. You may have this done at [facility].  Testing/Procedures: Your physician has requested that you have an echocardiogram. Echocardiography is a painless test that uses sound waves to create images of your heart. It provides your doctor with information about the size and shape of your heart and how well your heart's chambers and valves are working. This procedure takes approximately one hour. There are no restrictions for this procedure.  Follow-Up:  Your physician recommends that you schedule a follow-up appointment in: 3 months (office).  Any Other Special Instructions Will Be Listed Below (If Applicable).  If you need a refill on your cardiac medications before your next appointment, please call your pharmacy.

## 2019-05-22 ENCOUNTER — Telehealth: Payer: Self-pay | Admitting: *Deleted

## 2019-05-22 NOTE — Telephone Encounter (Signed)
Patient informed. 

## 2019-05-22 NOTE — Telephone Encounter (Signed)
-----   Message from Jonelle Sidle, MD sent at 05/22/2019  1:39 PM EST ----- Results reviewed.  Nice result on Crestor with LDL down to 62.  Would continue same.

## 2019-05-23 ENCOUNTER — Encounter: Payer: Self-pay | Admitting: *Deleted

## 2019-06-07 ENCOUNTER — Ambulatory Visit (INDEPENDENT_AMBULATORY_CARE_PROVIDER_SITE_OTHER): Payer: Medicare Other

## 2019-06-07 ENCOUNTER — Other Ambulatory Visit: Payer: Self-pay

## 2019-06-07 DIAGNOSIS — I429 Cardiomyopathy, unspecified: Secondary | ICD-10-CM | POA: Diagnosis not present

## 2019-06-13 ENCOUNTER — Telehealth: Payer: Self-pay | Admitting: *Deleted

## 2019-06-13 NOTE — Telephone Encounter (Signed)
-----   Message from Jonelle Sidle, MD sent at 06/08/2019  3:43 PM EST ----- Results reviewed.  LVEF 35 to 40% at this time, mildly reduced RV contraction.  Bioprosthetic AVR function is stable as well.  She was clinically stable at recent office follow-up.  Do not anticipate medication changes at this time.  Keep regular follow-up.

## 2019-06-13 NOTE — Telephone Encounter (Signed)
Patient informed. Copy sent to PCP °

## 2019-08-17 ENCOUNTER — Encounter: Payer: Self-pay | Admitting: Cardiology

## 2019-08-17 ENCOUNTER — Other Ambulatory Visit: Payer: Self-pay

## 2019-08-17 ENCOUNTER — Ambulatory Visit: Payer: Medicare Other | Admitting: Cardiology

## 2019-08-17 VITALS — BP 108/56 | HR 76 | Ht 63.0 in | Wt 135.0 lb

## 2019-08-17 DIAGNOSIS — I429 Cardiomyopathy, unspecified: Secondary | ICD-10-CM

## 2019-08-17 DIAGNOSIS — Z953 Presence of xenogenic heart valve: Secondary | ICD-10-CM

## 2019-08-17 DIAGNOSIS — I25119 Atherosclerotic heart disease of native coronary artery with unspecified angina pectoris: Secondary | ICD-10-CM

## 2019-08-17 DIAGNOSIS — E782 Mixed hyperlipidemia: Secondary | ICD-10-CM

## 2019-08-17 NOTE — Progress Notes (Signed)
Cardiology Office Note  Date: 08/17/2019   ID: Erin Herman, DOB [birth year], MRN [id number]  PCP:  Kirstie Peri, MD  Cardiologist:  Nona Dell, MD Electrophysiologist:  None   Chief Complaint  Patient presents with  . Cardiac follow-up    History of Present Illness: Erin Herman is a 86 y.o. female last seen in January.  She is here today for a follow-up visit.  Overall, stable from a cardiac perspective.  No progressive angina symptoms, stable NYHA class II dyspnea with low-level activity.  She is using a cane, denies any recent falls.  She has had no syncope.  I reviewed her medications which are stable from a cardiac perspective and outlined below.  She continues to tolerate Crestor. Follow-up lipid panel in January showed LDL 62.  Past Medical History:  Diagnosis Date  . Anemia   . Anxiety   . Aortic stenosis    Bioprosthetic AVR 2006  . CAD (coronary artery disease), native coronary artery    Multivessel status post CABG 2006, occluded SVG to circumflex, DES to circumflex July 2020  . Cardiomyopathy (HCC)   . Chronic kidney disease   . Depression   . Dysphagia   . Essential hypertension   . GERD (gastroesophageal reflux disease)   . Headache   . History of hiatal hernia   . HOH (hard of hearing)   . LBBB (left bundle branch block)   . Mixed hyperlipidemia   . NSTEMI (non-ST elevated myocardial infarction) [facility])    July 2020  . Osteoarthritis   . Postoperative nausea and vomiting    Morphine  . Type 2 diabetes mellitus (HCC)   . UTI (lower urinary tract infection)     Past Surgical History:  Procedure Laterality Date  . ABDOMINAL HYSTERECTOMY     partial  . AORTIC VALVE REPLACEMENT  2006   Dr. Cornelius Moras - 23 mm [place] stentless cadaver valve  . CORONARY ARTERY BYPASS GRAFT  2006    Dr. Cornelius Moras - LIMA to LAD, SVG to circumflex  . CORONARY STENT INTERVENTION N/A 11/06/2018   Procedure: CORONARY STENT INTERVENTION;  Surgeon: Yvonne Kendall,  MD;  [facility] INV[facility]IVE CV LAB;  Service: Cardiovascular;  Laterality: N/A;  . CORONARY/GRAFT ANGIOGRAPHY N/A 11/06/2018   Procedure: CORONARY/GRAFT ANGIOGRAPHY;  Surgeon: Yvonne Kendall, MD;  [facility]: [facility] INV[facility] LAB;  Service: Cardiovascular;  Laterality: N/A;  . TOTAL HIP ARTHROPLASTY Right 03/01/2017   Procedure: RIGHT TOTAL HIP ARTHROPLASTY ANTERIOR APPROACH;  Surgeon: Durene Romans, MD;  Location: [facility];  Service: Orthopedics;  Laterality: Right;  70 mins  . TOTAL KNEE ARTHROPLASTY Right 2006  . TOTAL KNEE ARTHROPLASTY Left 05/20/2014   DR Thurston Hole  . TOTAL KNEE ARTHROPLASTY Left 05/20/2014   Procedure: LEFT TOTAL KNEE ARTHROPLASTY;  Surgeon: Nilda Simmer, MD;  Location: [facility] OR;  Service: Orthopedics;  Laterality: Left;    Current Outpatient Medications  Medication Sig Dispense Refill  . APPLE CIDER VINEGAR PO Take 1 tablet by mouth every morning.    Marland Kitchen CINNAMON PO Take 1 tablet by mouth 2 (two) times daily.    . Coenzyme Q10 (COQ10 PO) Take 1 tablet by mouth daily.    . diclofenac sodium (VOLTAREN) 1 % GEL Apply 1 application topically at bedtime.    Marland Kitchen estradiol (ESTRACE) 0.1 MG/GM vaginal cream Place 1 Applicatorful vaginally See admin instructions. Every other night    . famotidine (PEPCID) 20 MG tablet Take 1 tablet (20 mg total) by mouth 2 (two) times daily.  60 tablet 1  . furosemide (LASIX) 40 MG tablet Take 60 mg am and 40 pm late afternoon 225 tablet 3  . levothyroxine (SYNTHROID) 88 MCG tablet Take 88 mcg by mouth daily before breakfast.    . losartan (COZAAR) 25 MG tablet Take 0.5 tablets (12.5 mg total) by mouth daily. 45 tablet 3  . metFORMIN (GLUCOPHAGE) 500 MG tablet Take 1 tablet (500 mg total) by mouth 2 (two) times daily with a meal.    . metolazone (ZAROXOLYN) 5 MG tablet Take 1 tablet as needed for swelling 30 tablet 0  . metoprolol succinate (TOPROL-XL) 50 MG 24 hr tablet Take 1 tablet (50 mg total) by mouth daily. 90 tablet 3  . Multiple Vitamins-Minerals  (HAIR/SKIN/NAILS/BIOTIN) TABS Take 1 tablet by mouth daily.    . Multiple Vitamins-Minerals (MULTIVITAMIN PO) Take 1 tablet by mouth daily.    Bertram Gala Glycol-Propyl Glycol (SYSTANE OP) Place 1 drop into both eyes 4 (four) times daily as needed (for burning eyes).     . potassium chloride SA (K-DUR,KLOR-CON) 20 MEQ tablet Take 1 tablet (20 mEq total) by mouth daily after supper. 90 tablet 0  . rosuvastatin (CRESTOR) 5 MG tablet Take 1 tablet (5 mg total) by mouth daily. 90 tablet 3  . sertraline (ZOLOFT) 100 MG tablet Take 50 mg by mouth at bedtime.     Marland Kitchen spironolactone (ALDACTONE) 25 MG tablet Take 0.5 tablets (12.5 mg total) by mouth daily. 45 tablet 3  . ticagrelor (BRILINTA) 90 MG TABS tablet Take 1 tablet (90 mg total) by mouth 2 (two) times daily. 180 tablet 2  . trimethoprim (TRIMPEX) 100 MG tablet Take 100 mg by mouth daily.    . vitamin C (ASCORBIC ACID) 500 MG tablet Take 500 mg by mouth 2 (two) times daily.    Marland Kitchen VITAMIN E PO Take 1 tablet by mouth daily.     No current facility-administered medications for this visit.   Allergies:  Lipitor [atorvastatin], Morphine, Penicillins, and Reclast [zoledronic acid]   ROS:  Hearing loss.  Physical Exam: VS:  BP (!) 108/56   Pulse 76   Ht 5\' 3"  (1.6 m)   Wt 135 lb (61.2 kg)   LMP  (LMP Unknown)   SpO2 98%   BMI 23.91 kg/m , BMI Body mass index is 23.91 kg/m.  Wt Readings from Last 3 Encounters:  08/17/19 135 lb (61.2 kg)  05/18/19 135 lb 12.8 oz (61.6 kg)  02/22/19 137 lb 3.2 oz (62.2 kg)    General: Patient appears comfortable at rest.  Using a cane. HEENT: Conjunctiva and lids normal, wearing a mask. Neck: Supple, no elevated JVP or carotid bruits, no thyromegaly. Lungs: Clear to auscultation, nonlabored breathing at rest. Cardiac: Regular rate and rhythm, no S3, soft systolic murmur, no pericardial rub. Extremities: No pitting edema, distal pulses 2+.  ECG:  An ECG dated 11/06/2018 was personally reviewed today and  demonstrated:  Sinus rhythm with prolonged PR interval and left bundle branch block.  Recent Labwork: 11/05/2018: B Natriuretic Peptide 272.5 11/07/2018: BUN 17; Creatinine, Ser 0.98; Hemoglobin 11.2; Magnesium 1.9; Platelets 242; Potassium 4.1; Sodium 138  January 2021: Cholesterol 124, triglycerides 102, HDL 42, LDL 62  Other Studies Reviewed Today:  Cardiac catheterization 11/06/2018: Conclusions: 1. Severe three-vessel coronary artery disease, including 90% proximal/mid LAD disease, sequential 95% and 50% proximal/mid LCx lesions, long 80% OM1 stenosis, and severe diffuse disease involving non-dominant RCA. 2. Widely patent LIMA-LAD. 3. Chronically occluded SVG-LCx (most likely grafted to  OM1). 4. Successful PCI to 95% proximal LCx stenosis using Resolute Onyx 3.5 x 12 mm drug-eluting stent with 0% residual stenosis and TIMI-3 flow.  Echocardiogram 06/07/2019: 1. Left ventricular ejection fraction, by estimation, is 35 to 40%. The  left ventricle has moderately decreased function. The left ventrical  demonstrates regional wall motion abnormalities (see scoring  diagram/findings for description). There is mildly  increased asymmetric left ventricular hypertrophy of the posterior  segment. Left ventricular diastolic parameters are consistent with Grade  II diastolic dysfunction (pseudonormalization). Elevated left ventricular  end-diastolic pressure.  2. Right ventricular systolic function is mildly reduced. The right  ventricular size is moderately enlarged. There is mildly elevated  pulmonary artery systolic pressure.  3. Left atrial size was moderately dilated.  4. Right atrial size was mildly dilated.  5. The mitral valve is degenerative. Moderate mitral valve regurgitation.  6. Tricuspid valve regurgitation is mild to moderate.  7. The aortic valve has been repaired/replaced. Aortic valve  regurgitation is not visualized. No aortic stenosis is present. 71mm  [place]  bioprosthetic valve valve is present in the aortic position. Echo  findings show normal structure and function of  the aortic prosthesis.  8. The inferior vena cava is normal in size with greater than 50%  respiratory variability, suggesting right atrial pressure of 3 mmHg.  Assessment and Plan:  1.  Multivessel CAD status post CABG with documented graft disease and status post DES to the proximal circumflex in July 2020.  She reports no active angina at this time on medical therapy.  Continue Brilinta, Crestor, Toprol-XL, and losartan.  2.  Secondary cardiomyopathy, LVEF 35 to 40% by echocardiogram in February.  Continue medical therapy including Toprol-XL, losartan, Aldactone, and Lasix.  She is not a candidate for ICD.  3.  Mixed hyperlipidemia, lipids much better controlled on Crestor with last LDL 62.  4.  Bioprosthetic AVR, stable by follow-up echocardiogram in February.  Medication Adjustments/Labs and Tests Ordered: Current medicines are reviewed at length with the patient today.  Concerns regarding medicines are outlined above.   Tests Ordered: Orders Placed This Encounter  Procedures  . Basic metabolic panel    Medication Changes: No orders of the defined types were placed in this encounter.   Disposition:  Follow up 4 months in the [place] office.  Signed, Satira Sark, MD, [facility] 08/17/2019 2:57 PM    [place] at [place], [place], [place] [address] Phone: [phone]; Fax: [phone]

## 2019-08-17 NOTE — Patient Instructions (Addendum)
Medication Instructions:   Your physician recommends that you continue on your current medications as directed. Please refer to the Current Medication list given to you today.  Labwork:  Your physician recommends that you return for non-fasting lab work in: 4 months just before your next visit to check your BMET. You may have this done at [facility] any day Monday - Friday from 7:30 am - 4:00 pm.   Testing/Procedures:  NONE  Follow-Up:  Your physician recommends that you schedule a follow-up appointment in: 4 months (office).  Any Other Special Instructions Will Be Listed Below (If Applicable).  If you need a refill on your cardiac medications before your next appointment, please call your pharmacy.

## 2019-10-07 ENCOUNTER — Other Ambulatory Visit: Payer: Self-pay | Admitting: Cardiology

## 2019-12-12 ENCOUNTER — Telehealth: Payer: Self-pay | Admitting: *Deleted

## 2019-12-12 NOTE — Telephone Encounter (Signed)
-----   Message from Jonelle Sidle, MD sent at 12/12/2019  8:09 AM EDT ----- Results reviewed.  Please check with her about control of edema on current diuretic dose.  BUN and creatinine have bumped up to 52 at 1.61 respectively, potassium mildly low at 3.3.  I may need to cut back her standing Lasix dose.

## 2019-12-13 NOTE — Telephone Encounter (Signed)
Pt says she is still swelling taking lasix 60 in the morning and 40 mg in the evening - no additional weight gain or symptoms

## 2019-12-13 NOTE — Telephone Encounter (Signed)
Let's see if she might do better by switching from Lasix to Demadex at 40 mg twice daily.  Continue potassium supplements.  Recheck BMET in 7 to 10 days.

## 2019-12-17 ENCOUNTER — Other Ambulatory Visit: Payer: Self-pay

## 2019-12-17 ENCOUNTER — Ambulatory Visit: Payer: Medicare Other | Admitting: Cardiology

## 2019-12-17 ENCOUNTER — Encounter: Payer: Self-pay | Admitting: Cardiology

## 2019-12-17 VITALS — BP 100/58 | HR 72 | Ht 63.0 in | Wt 131.0 lb

## 2019-12-17 DIAGNOSIS — I25119 Atherosclerotic heart disease of native coronary artery with unspecified angina pectoris: Secondary | ICD-10-CM | POA: Diagnosis not present

## 2019-12-17 DIAGNOSIS — I5032 Chronic diastolic (congestive) heart failure: Secondary | ICD-10-CM

## 2019-12-17 DIAGNOSIS — I429 Cardiomyopathy, unspecified: Secondary | ICD-10-CM | POA: Diagnosis not present

## 2019-12-17 MED ORDER — FUROSEMIDE 40 MG PO TABS: 40.0000 mg | ORAL_TABLET | Freq: Two times a day (BID) | ORAL | 6 refills | Status: DC

## 2019-12-17 MED ORDER — TICAGRELOR 90 MG PO TABS: 90.0000 mg | ORAL_TABLET | Freq: Two times a day (BID) | ORAL | 2 refills | Status: DC

## 2019-12-17 NOTE — Progress Notes (Signed)
Cardiology Office Note  Date: 12/17/2019   ID: Erin Herman, DOB [birth year], MRN [id number]  PCP:  Kirstie Peri, MD  Cardiologist:  Nona Dell, MD Electrophysiologist:  None   Chief Complaint  Patient presents with  . Cardiac follow-up    History of Present Illness: Erin Herman is a 86 y.o. female last seen in April.  She is here today with her daughter for a follow-up visit.  From a cardiac perspective, she reports no active angina symptoms and states that her sense of swelling has been stable.  No substantial weight gain noted.  Telephone notes reviewed, my impression was that her edema had not improved with recommendation to switch to [facility], however per discussion with her today, she seems to be satisfied with Lasix, and we will actually cut this down to 40 mg twice daily in light of her renal insufficiency.  I personally reviewed her ECG today which shows sinus rhythm with prolonged PR interval and left bundle branch block, PVC.  She reports problems with left hip pain, plans to see her orthopedist.  Also has had a tooth abscess with plan for root canal.  Past Medical History:  Diagnosis Date  . Anemia   . Anxiety   . Aortic stenosis    Bioprosthetic AVR 2006  . CAD (coronary artery disease), native coronary artery    Multivessel status post CABG 2006, occluded SVG to circumflex, DES to circumflex July 2020  . Cardiomyopathy (HCC)   . Chronic kidney disease   . Depression   . Dysphagia   . Essential hypertension   . GERD (gastroesophageal reflux disease)   . Headache   . History of hiatal hernia   . HOH (hard of hearing)   . LBBB (left bundle branch block)   . Mixed hyperlipidemia   . NSTEMI (non-ST elevated myocardial infarction) [facility])    July 2020  . Osteoarthritis   . Postoperative nausea and vomiting    Morphine  . Type 2 diabetes mellitus (HCC)   . UTI (lower urinary tract infection)     Past Surgical History:  Procedure  Laterality Date  . ABDOMINAL HYSTERECTOMY     partial  . AORTIC VALVE REPLACEMENT  2006   Dr. Cornelius Moras - 23 mm Toronto stentless cadaver valve  . CORONARY ARTERY BYPASS GRAFT  2006    Dr. Cornelius Moras - LIMA to LAD, SVG to circumflex  . CORONARY STENT INTERVENTION N/A 11/06/2018   Procedure: CORONARY STENT INTERVENTION;  Surgeon: Yvonne Kendall, MD;  Location: [facility] INVASIVE CV LAB;  Service: Cardiovascular;  Laterality: N/A;  . CORONARY/GRAFT ANGIOGRAPHY N/A 11/06/2018   Procedure: CORONARY/GRAFT ANGIOGRAPHY;  Surgeon: Yvonne Kendall, MD;  Location: [facility] INVASIVE CV LAB;  Service: Cardiovascular;  Laterality: N/A;  . TOTAL HIP ARTHROPLASTY Right 03/01/2017   Procedure: RIGHT TOTAL HIP ARTHROPLASTY ANTERIOR APPROACH;  Surgeon: Durene Romans, MD;  Location: [facility] ORS;  Service: Orthopedics;  Laterality: Right;  70 mins  . TOTAL KNEE ARTHROPLASTY Right 2006  . TOTAL KNEE ARTHROPLASTY Left 05/20/2014   DR Thurston Hole  . TOTAL KNEE ARTHROPLASTY Left 05/20/2014   Procedure: LEFT TOTAL KNEE ARTHROPLASTY;  Surgeon: Nilda Simmer, MD;  Location: [facility] OR;  Service: Orthopedics;  Laterality: Left;    Current Outpatient Medications  Medication Sig Dispense Refill  . APPLE CIDER VINEGAR PO Take 1 tablet by mouth every morning.    Marland Kitchen CINNAMON PO Take 1 tablet by mouth 2 (two) times daily.    . clindamycin (CLEOCIN) 150 MG capsule Take  150 mg by mouth 4 (four) times daily.    . Coenzyme Q10 (COQ10 PO) Take 1 tablet by mouth daily.    . diclofenac sodium (VOLTAREN) 1 % GEL Apply 1 application topically at bedtime.    Marland Kitchen estradiol (ESTRACE) 0.1 MG/GM vaginal cream Place 1 Applicatorful vaginally See admin instructions. Every other night    . famotidine (PEPCID) 20 MG tablet Take 1 tablet (20 mg total) by mouth 2 (two) times daily. 60 tablet 1  . furosemide (LASIX) 40 MG tablet Take 1 tablet (40 mg total) by mouth 2 (two) times daily. 60 tablet 6  . levothyroxine (SYNTHROID) 88 MCG tablet Take 88 mcg by mouth daily before  breakfast.    . losartan (COZAAR) 25 MG tablet Take 0.5 tablets (12.5 mg total) by mouth daily. 45 tablet 3  . metFORMIN (GLUCOPHAGE) 500 MG tablet Take 1 tablet (500 mg total) by mouth 2 (two) times daily with a meal.    . metolazone (ZAROXOLYN) 5 MG tablet Take 1 tablet as needed for swelling 30 tablet 0  . metoprolol succinate (TOPROL-XL) 50 MG 24 hr tablet Take 1 tablet (50 mg total) by mouth daily. 90 tablet 3  . Multiple Vitamins-Minerals (HAIR/SKIN/NAILS/BIOTIN) TABS Take 1 tablet by mouth daily.    . Multiple Vitamins-Minerals (MULTIVITAMIN PO) Take 1 tablet by mouth daily.    Bertram Gala Glycol-Propyl Glycol (SYSTANE OP) Place 1 drop into both eyes 4 (four) times daily as needed (for burning eyes).     . potassium chloride SA (K-DUR,KLOR-CON) 20 MEQ tablet Take 1 tablet (20 mEq total) by mouth daily after supper. 90 tablet 0  . sertraline (ZOLOFT) 100 MG tablet Take 50 mg by mouth at bedtime.     Marland Kitchen spironolactone (ALDACTONE) 25 MG tablet Take 0.5 tablets (12.5 mg total) by mouth daily. 45 tablet 3  . ticagrelor (BRILINTA) 90 MG TABS tablet Take 1 tablet (90 mg total) by mouth 2 (two) times daily. 180 tablet 2  . trimethoprim (TRIMPEX) 100 MG tablet Take 100 mg by mouth daily.    . vitamin C (ASCORBIC ACID) 500 MG tablet Take 500 mg by mouth 2 (two) times daily.    Marland Kitchen VITAMIN E PO Take 1 tablet by mouth daily.    . rosuvastatin (CRESTOR) 5 MG tablet Take 1 tablet (5 mg total) by mouth daily. 90 tablet 3   No current facility-administered medications for this visit.   Allergies:  Lipitor [atorvastatin], Morphine, Penicillins, and Reclast [zoledronic acid]   ROS:   Left hip and knee pain.  Physical Exam: VS:  BP (!) 100/58   Pulse 72   Ht 5\' 3"  (1.6 m)   Wt 131 lb (59.4 kg)   LMP  (LMP Unknown)   SpO2 98%   BMI 23.21 kg/m , BMI Body mass index is 23.21 kg/m.  Wt Readings from Last 3 Encounters:  12/17/19 131 lb (59.4 kg)  08/17/19 135 lb (61.2 kg)  05/18/19 135 lb 12.8 oz  (61.6 kg)    General: Elderly woman, appears comfortable at rest. HEENT: Conjunctiva and lids normal, wearing a mask. Neck: Supple, no elevated JVP or carotid bruits, no thyromegaly. Lungs: Clear to auscultation, nonlabored breathing at rest. Cardiac: Regular rate and rhythm, no S3, soft systolic murmur, no pericardial rub. Extremities: No pitting edema, distal pulses 2+.  ECG:  An ECG dated 11/06/2018 was personally reviewed today and demonstrated:  Sinus rhythm with prolonged PR interval and left bundle branch block.  Recent Labwork:  Component Value Date/Time   CHOL 210 (H) 11/04/2018 2142   TRIG 117 11/04/2018 2142   HDL 45 11/04/2018 2142   CHOLHDL 4.7 11/04/2018 2142   VLDL 23 11/04/2018 2142   LDLCALC 142 (H) 11/04/2018 2142  August 2021: Potassium 3.3, BUN 52, creatinine 1.61  Other Studies Reviewed Today:  Cardiac catheterization 11/06/2018: Conclusions: 1. Severe three-vessel coronary artery disease, including 90% proximal/mid LAD disease, sequential 95% and 50% proximal/mid LCx lesions, long 80% OM1 stenosis, and severe diffuse disease involving non-dominant RCA. 2. Widely patent LIMA-LAD. 3. Chronically occluded SVG-LCx (most likely grafted to OM1). 4. Successful PCI to 95% proximal LCx stenosis using Resolute Onyx 3.5 x 12 mm drug-eluting stent with 0% residual stenosis and TIMI-3 flow.  Echocardiogram 06/07/2019: 1. Left ventricular ejection fraction, by estimation, is 35 to 40%. The  left ventricle has moderately decreased function. The left ventrical  demonstrates regional wall motion abnormalities (see scoring  diagram/findings for description). There is mildly  increased asymmetric left ventricular hypertrophy of the posterior  segment. Left ventricular diastolic parameters are consistent with Grade  II diastolic dysfunction (pseudonormalization). Elevated left ventricular  end-diastolic pressure.  2. Right ventricular systolic function is mildly reduced.  The right  ventricular size is moderately enlarged. There is mildly elevated  pulmonary artery systolic pressure.  3. Left atrial size was moderately dilated.  4. Right atrial size was mildly dilated.  5. The mitral valve is degenerative. Moderate mitral valve regurgitation.  6. Tricuspid valve regurgitation is mild to moderate.  7. The aortic valve has been repaired/replaced. Aortic valve  regurgitation is not visualized. No aortic stenosis is present. 49mm  Tor[place] bioprosthetic valve valve is present in the aortic position. Echo  findings show normal structure and function of  the aortic prosthesis.  8. The inferior vena cava is normal in size with greater than 50%  respiratory variability, suggesting right atrial pressure of 3 mmHg.  Assessment and Plan:  1.  Multivessel CAD status post CABG with documented graft disease and DES to the proximal circumflex in July 2020.  She is doing well without active angina on medical therapy.  Continue Brilinta, Toprol-XL, losartan, and Crestor.  2.  Secondary cardiomyopathy, LVEF 35 to 40%.  Weight is down and she reports reasonable control of fluid status.  Continue Lasix although at 40 mg twice daily, also Aldactone, Toprol-XL, and Cozaar.  She is not a candidate for ICD.  3.  Bioprosthetic AVR in place, stable by echocardiogram in February with soft systolic murmur.  Medication Adjustments/Labs and Tests Ordered: Current medicines are reviewed at length with the patient today.  Concerns regarding medicines are outlined above.   Tests Ordered: Orders Placed This Encounter  Procedures  . Basic metabolic panel  . EKG 12-Lead    Medication Changes: Meds ordered this encounter  Medications  . ticagrelor (BRILINTA) 90 MG TABS tablet    Sig: Take 1 tablet (90 mg total) by mouth 2 (two) times daily.    Dispense:  180 tablet    Refill:  2  . furosemide (LASIX) 40 MG tablet    Sig: Take 1 tablet (40 mg total) by mouth 2 (two) times  daily.    Dispense:  60 tablet    Refill:  6    12/17/2019 dose decrease    Disposition:  Follow up 6 months in the [place] office.  Signed, Jonelle Sidle, MD, [facility] 12/17/2019 12:15 PM    [facility] at [facility] [address] [place],  [place], [place] [address] Phone: [phone]; Fax: [phone]

## 2019-12-17 NOTE — Telephone Encounter (Signed)
Pt has appt this morning at 1120 and wants to discuss with Dr Diona Browner

## 2019-12-17 NOTE — Patient Instructions (Signed)
Medication Instructions:   Your physician has recommended you make the following change in your medication:   Decrease furosemide to 40 mg twice daily  Continue other medications the same  Labwork:  Your physician recommends that you return for non-fasting lab work in: 6 months just before your next visit to check your BMET.   This may be done at [facility] or [employer] or [employer] ([address] [place]) Monday-Friday from 8:00 am - 4:00 pm. No appointment is needed.  Testing/Procedures:  NONE  Follow-Up:  Your physician recommends that you schedule a follow-up appointment in: 6 months.  Any Other Special Instructions Will Be Listed Below (If Applicable).  If you need a refill on your cardiac medications before your next appointment, please call your pharmacy.

## 2020-02-04 ENCOUNTER — Telehealth: Payer: Self-pay | Admitting: *Deleted

## 2020-02-04 NOTE — Telephone Encounter (Signed)
   [facility] Pre-operative Risk Assessment    HEARTCARE STAFF: - Please ensure there is not already an duplicate clearance open for this procedure. - Under Visit Info/Reason for Call, type in Other and utilize the format Clearance MM/DD/YY or Clearance TBD. Do not use dashes or single digits. - If request is for dental extraction, please clarify the # of teeth to be extracted.  Request for surgical clearance:  1. What type of surgery is being performed? Tooth extraction  2. When is this surgery scheduled? 02/13/2020  3. What type of clearance is required (medical clearance vs. Pharmacy clearance to hold med vs. Both)? Medication  4. Are there any medications that need to be held prior to surgery and how long? Brilinta  5. Practice name and name of physician performing surgery? [facility] Mancel [facility]  6. What is the office phone number? [phone]   7.   What is the office fax number? [phone]  8.   Anesthesia type (None, local, MAC, general) ? Not provided   Marlou Sa 02/04/2020, 7:16 AM  _________________________________________________________________   (provider comments below)

## 2020-02-04 NOTE — Telephone Encounter (Signed)
   Primary Cardiologist: Nona Dell, MD  Chart reviewed as part of pre-operative protocol coverage. Simple dental extractions are considered low risk procedures per guidelines and generally do not require any specific cardiac clearance. It is also generally accepted that for simple extractions and dental cleanings, there is no need to interrupt blood thinner therapy.   SBE prophylaxis is not required for the patient.  I will route this recommendation to the requesting party via Epic fax function and remove from pre-op pool.  Please call with questions.  Ronney Asters, NP 02/04/2020, 8:49 AM

## 2020-03-27 ENCOUNTER — Telehealth: Payer: Self-pay | Admitting: *Deleted

## 2020-03-27 NOTE — Telephone Encounter (Signed)
° °  [facility] Pre-operative Risk Assessment    HEARTCARE STAFF: - Please ensure there is not already an duplicate clearance open for this procedure. - Under Visit Info/Reason for Call, type in Other and utilize the format Clearance MM/DD/YY or Clearance TBD. Do not use dashes or single digits. - If request is for dental extraction, please clarify the # of teeth to be extracted.  Request for surgical clearance:  1. What type of surgery is being performed? Left Total Hip Arthoplasty  2. When is this surgery scheduled? 04/29/2020  3. What type of clearance is required (medical clearance vs. Pharmacy clearance to hold med vs. Both)? Both  4. Are there any medications that need to be held prior to surgery and how long? Brilinta 7 days prior and resume ? Days after surgery  5. Practice name and name of physician performing surgery? [facility]/Dr. Paralee Cancel  6. What is the office phone number? [phone]   7.   What is the office fax number? [phone]  8.   Anesthesia type (None, local, MAC, general) ? Spinal   Marlou Sa 03/27/2020, 4:46 PM  _________________________________________________________________   Attention: Orson Slick

## 2020-03-28 NOTE — Telephone Encounter (Signed)
   Primary Cardiologist: Nona Dell, MD  Chart reviewed as part of pre-operative protocol coverage. Given past medical history and time since last visit, based on ACC/AHA guidelines, Erin Herman would be at acceptable risk for the planned procedure without further cardiovascular testing.   Her Brilinta may be held for 5 days prior to her procedure. Please resume as soon as hemostasis is achieved  Patient was advised that if she develops new symptoms prior to surgery to contact our office to arrange a follow-up appointment.  She verbalized understanding.  I will route this recommendation to the requesting party via Epic fax function and remove from pre-op pool.  Please call with questions.  Thomasene Ripple. Dawanna Grauberger NP-C    03/28/2020, 11:25 AM [facility] [address] Office [phone] Fax [phone]

## 2020-03-28 NOTE — Telephone Encounter (Signed)
Erin Herman 86 year old female is requesting a left total hip arthroplasty.  She was last seen by me on the clinic on 12/17/2019.  She was doing well at that time and had no cardiac complaints.  Her lower extremity edema was stable and weight was stable.  Her Lasix was reduced.  EKG showed sinus rhythm with prolonged PR interval and left arm branch block with PVCs.  Her PMH also includes anemia, anxiety, aortic stenosis status post bioprosthetic AVR 2006, CAD with DES to circumflex 7/20, cardiomyopathy, depression, GERD, essential hypertension, mixed hyperlipidemia, and diabetes type 2.  May her Brilinta be held prior to the surgery?  Thank you for your help.  Please direct response to CV DIV preop pool.  Thomasene Ripple. Carilyn Woolston NP-C    03/28/2020, 11:01 AM [facility] [address] Office [phone] Fax [phone]

## 2020-03-28 NOTE — Telephone Encounter (Signed)
Patient contacted as part of preoperative cardiac evaluation. She continues to do well. Weight has been stable. She continues to tolerate her medications well. And has no cardiac complaints at this time.

## 2020-03-28 NOTE — Telephone Encounter (Signed)
Brilinta may be held 5 days prior to surgery.

## 2020-04-02 ENCOUNTER — Telehealth: Payer: Self-pay | Admitting: Cardiology

## 2020-04-02 MED ORDER — TICAGRELOR 90 MG PO TABS: 90.0000 mg | ORAL_TABLET | Freq: Two times a day (BID) | ORAL | 0 refills | Status: DC

## 2020-04-02 NOTE — Telephone Encounter (Signed)
Pt is in the doughnut hole with her ticagrelor (BRILINTA) 90 MG TABS tablet [[id number]]   Please call [phone]

## 2020-04-02 NOTE — Telephone Encounter (Signed)
Patient aware we have samples ready

## 2020-04-17 NOTE — Patient Instructions (Addendum)
DUE TO COVID-19 ONLY ONE VISITOR IS ALLOWED TO COME WITH YOU AND STAY IN THE WAITING ROOM ONLY DURING PRE OP AND PROCEDURE DAY OF SURGERY. THE 1 VISITOR  MAY VISIT WITH YOU AFTER SURGERY IN YOUR PRIVATE ROOM DURING VISITING HOURS ONLY!  YOU NEED TO HAVE A COVID 19 TEST ON_1/3_____ @_1 :30 pm______, THIS TEST MUST BE DONE BEFORE SURGERY,  COVID TESTING SITE [address] [place] [id number], IT IS ON THE RIGHT GOING OUT [address] APPROXIMATELY  2 MINUTES PAST [employer] ON THE RIGHT. ONCE YOUR COVID TEST IS COMPLETED,  PLEASE BEGIN THE QUARANTINE INSTRUCTIONS AS OUTLINED IN YOUR HANDOUT.                Erin Herman    Your procedure is scheduled on: 04/29/20   [facility] Main  Entrance   Report to admitting at 6:10 AM     Call this number if you have problems the morning of surgery [phone]     BRUSH YOUR TEETH MORNING OF SURGERY AND RINSE YOUR MOUTH OUT, NO CHEWING GUM CANDY OR MINTS.    No food after midnight.    You may have clear liquid until 5:30 AM.    At 5:00  AM drink pre surgery drink.   Nothing by mouth after 5:30 AM.   Take these medicines the morning of surgery with A SIP OF WATER: Metoprolol, Trimpex, Levothyroxine   How to Manage Your Diabetes Before and After Surgery  Why is it important to control my blood sugar before and after surgery? . Improving blood sugar levels before and after surgery helps healing and can limit problems. . A way of improving blood sugar control is eating a healthy diet by: o  Eating less sugar and carbohydrates o  Increasing activity/exercise o  Talking with your doctor about reaching your blood sugar goals . High blood sugars (greater than 180 mg/dL) can raise your risk of infections and slow your recovery, so you will need to focus on controlling your diabetes during the weeks before surgery. . Make sure that the doctor who takes care of your diabetes knows about your planned  surgery including the date and location.  How do I manage my blood sugar before surgery? . Check your blood sugar at least 4 times a day, starting 2 days before surgery, to make sure that the level is not too high or low. o Check your blood sugar the morning of your surgery when you wake up and every 2 hours until you get to the Short Stay unit. . If your blood sugar is less than 70 mg/dL, you will need to treat for low blood sugar: o Do not take insulin. o Treat a low blood sugar (less than 70 mg/dL) with  cup of clear juice (cranberry or apple), 4 glucose tablets, OR glucose gel. o Recheck blood sugar in 15 minutes after treatment (to make sure it is greater than 70 mg/dL). If your blood sugar is not greater than 70 mg/dL on recheck, call [phone] for further instructions. . Report your blood sugar to the short stay nurse when you get to Short Stay.  . If you are admitted to the hospital after surgery: o Your blood sugar will be checked by the staff and you will probably be given insulin after surgery (instead of oral diabetes medicines) to make sure you have good blood sugar levels. o The goal for blood sugar control after surgery is 80-180 mg/dL.  WHAT DO I DO ABOUT MY DIABETES MEDICATION?  Marland Kitchen Do not take oral diabetes medicines (pills) the morning of surgery.                                 You may not have any metal on your body including hair pins and              piercings  Do not wear jewelry, make-up, lotions, powders or perfumes, deodorant             Do not wear nail polish on your fingernails.  Do not shave  48 hours prior to surgery.              Do not bring valuables to the hospital. [facility] IS NOT             RESPONSIBLE   FOR VALUABLES.  Contacts, dentures or bridgework may not be worn into surgery.      Patients discharged the day of surgery will not be allowed to drive home.   IF YOU ARE HAVING SURGERY AND GOING HOME THE SAME DAY, YOU MUST HAVE AN ADULT  TO DRIVE YOU HOME AND BE WITH YOU FOR 24 HOURS. YOU MAY GO HOME BY TAXI OR UBER OR ORTHERWISE, BUT AN ADULT MUST ACCOMPANY YOU HOME AND STAY WITH YOU FOR 24 HOURS.  Name and phone number of your driver:  Special Instructions: N/A              Please read over the following fact sheets you were given: _____________________________________________________________________             [facility] - Preparing for Surgery Before surgery, you can play an important role.   Because skin is not sterile, your skin needs to be as free of germs as possible.   You can reduce the number of germs on your skin by washing with CHG (chlorahexidine gluconate) soap before surgery.   CHG is an antiseptic cleaner which kills germs and bonds with the skin to continue killing germs even after washing. Please DO NOT use if you have an allergy to CHG or antibacterial soaps.   If your skin becomes reddened/irritated stop using the CHG and inform your nurse when you arrive at Short Stay. Do not shave (including legs and underarms) for at least 48 hours prior to the first CHG shower.    Please follow these instructions carefully:  1.  Shower with CHG Soap the night before surgery and the  morning of Surgery.  2.  If you choose to wash your hair, wash your hair first as usual with your  normal  shampoo.  3.  After you shampoo, rinse your hair and body thoroughly to remove the  shampoo.                                        4.  Use CHG as you would any other liquid soap.  You can apply chg directly  to the skin and wash                       Gently with a scrungie or clean washcloth.  5.  Apply the CHG Soap to your body ONLY FROM THE NECK DOWN.   Do not use on face/ open  Wound or open sores. Avoid contact with eyes, ears mouth and genitals (private parts).                       Wash face,  Genitals (private parts) with your normal soap.             6.  Wash thoroughly, paying special attention  to the area where your surgery  will be performed.  7.  Thoroughly rinse your body with warm water from the neck down.  8.  DO NOT shower/wash with your normal soap after using and rinsing off  the CHG Soap.                9.  Pat yourself dry with a clean towel.            10.  Wear clean pajamas.            11.  Place clean sheets on your bed the night of your first shower and do not  sleep with pets. Day of Surgery : Do not apply any lotions/deodorants the morning of surgery.  Please wear clean clothes to the hospital/surgery center.  FAILURE TO FOLLOW THESE INSTRUCTIONS MAY RESULT IN THE CANCELLATION OF YOUR SURGERY PATIENT SIGNATURE_________________________________  NURSE SIGNATURE__________________________________  ________________________________________________________________________   Erin Herman  An incentive spirometer is a tool that can help keep your lungs clear and active. This tool measures how well you are filling your lungs with each breath. Taking long deep breaths may help reverse or decrease the chance of developing breathing (pulmonary) problems (especially infection) following:  A long period of time when you are unable to move or be active. BEFORE THE PROCEDURE   If the spirometer includes an indicator to show your best effort, your nurse or respiratory therapist will set it to a desired goal.  If possible, sit up straight or lean slightly forward. Try not to slouch.  Hold the incentive spirometer in an upright position. INSTRUCTIONS FOR USE  1. Sit on the edge of your bed if possible, or sit up as far as you can in bed or on a chair. 2. Hold the incentive spirometer in an upright position. 3. Breathe out normally. 4. Place the mouthpiece in your mouth and seal your lips tightly around it. 5. Breathe in slowly and as deeply as possible, raising the piston or the ball toward the top of the column. 6. Hold your breath for 3-5 seconds or for as long as  possible. Allow the piston or ball to fall to the bottom of the column. 7. Remove the mouthpiece from your mouth and breathe out normally. 8. Rest for a few seconds and repeat Steps 1 through 7 at least 10 times every 1-2 hours when you are awake. Take your time and take a few normal breaths between deep breaths. 9. The spirometer may include an indicator to show your best effort. Use the indicator as a goal to work toward during each repetition. 10. After each set of 10 deep breaths, practice coughing to be sure your lungs are clear. If you have an incision (the cut made at the time of surgery), support your incision when coughing by placing a pillow or rolled up towels firmly against it. Once you are able to get out of bed, walk around indoors and cough well. You may stop using the incentive spirometer when instructed by your caregiver.  RISKS AND COMPLICATIONS  Take your time so you do not  get dizzy or light-headed.  If you are in pain, you may need to take or ask for pain medication before doing incentive spirometry. It is harder to take a deep breath if you are having pain. AFTER USE  Rest and breathe slowly and easily.  It can be helpful to keep track of a log of your progress. Your caregiver can provide you with a simple table to help with this. If you are using the spirometer at home, follow these instructions: SEEK MEDICAL CARE IF:   You are having difficultly using the spirometer.  You have trouble using the spirometer as often as instructed.  Your pain medication is not giving enough relief while using the spirometer.  You develop fever of 100.5 F (38.1 C) or higher. SEEK IMMEDIATE MEDICAL CARE IF:   You cough up bloody sputum that had not been present before.  You develop fever of 102 F (38.9 C) or greater.  You develop worsening pain at or near the incision site. MAKE SURE YOU:   Understand these instructions.  Will watch your condition.  Will get help right  away if you are not doing well or get worse. Document Released: 08/23/2006 Document Revised: 07/05/2011 Document Reviewed: 10/24/2006 [facility] Patient Information 2014 [place], [place].   ________________________________________________________________________

## 2020-04-21 NOTE — H&P (Signed)
TOTAL HIP ADMISSION H&P  Patient is admitted for left total hip arthroplasty.  Subjective:  Chief Complaint: left hip OA / pain  HPI: Erin Herman, 86 y.o. female, has a history of pain and functional disability in the left hip(s) due to arthritis and patient has failed non-surgical conservative treatments for greater than 12 weeks to include NSAID's and/or analgesics, use of assistive devices and activity modification.  Onset of symptoms was gradual starting ~1 year ago with gradually worsening course since that time.The patient noted no past surgery on the left hip(s).  Patient currently rates pain in the left hip at 7 out of 10 with activity. Patient has night pain, worsening of pain with activity and weight bearing, trendelenberg gait, pain that interfers with activities of daily living and pain with passive range of motion. Patient has evidence of periarticular osteophytes and joint space narrowing by imaging studies. This condition presents safety issues increasing the risk of falls.  There is no current active infection.  Risks, benefits and expectations were discussed with the patient.  Risks including but not limited to the risk of anesthesia, blood clots, nerve damage, blood vessel damage, failure of the prosthesis, infection and up to and including death.  Patient understand the risks, benefits and expectations and wishes to proceed with surgery.   D/C Plans:       Home   Post-op Meds:       No Rx given  Tranexamic Acid:      To be given - IV   Decadron:      Is to be given  FYI:      Brillenta (on pre-op)  Norco 5  DME:   Rx sent for - RW & 3-N-1  PT:   HEP  [facility]: [facility]   Patient Active Problem List   Diagnosis Date Noted  . NSTEMI (non-ST elevated myocardial infarction) (HCC) 11/04/2018  . Dysphagia 05/10/2018  . Diabetes (HCC) 02/27/2018  . S/P right THA, AA 03/01/2017  . Postoperative anemia due to acute blood loss 05/22/2014  . DJD (degenerative  joint disease) of knee 05/20/2014  . Primary localized osteoarthritis of left knee 04/30/2014  . Preoperative cardiovascular examination 04/04/2014  . Chronic diastolic heart failure (HCC) 07/03/2012  . CAROTID ARTERY DISEASE 12/09/2009  . Essential hypertension, benign 08/05/2008  . CAD, NATIVE VESSEL 08/05/2008  . Secondary cardiomyopathy (HCC) 08/05/2008  . Aortic valve disorder 08/05/2008  . Hyperlipidemia 08/03/2008   Past Medical History:  Diagnosis Date  . Anemia   . Anxiety   . Aortic stenosis    Bioprosthetic AVR 2006  . CAD (coronary artery disease), native coronary artery    Multivessel status post CABG 2006, occluded SVG to circumflex, DES to circumflex July 2020  . Cardiomyopathy (HCC)   . Chronic kidney disease   . Depression   . Dysphagia   . Essential hypertension   . GERD (gastroesophageal reflux disease)   . Headache   . History of hiatal hernia   . HOH (hard of hearing)   . LBBB (left bundle branch block)   . Mixed hyperlipidemia   . NSTEMI (non-ST elevated myocardial infarction) [facility])    July 2020  . Osteoarthritis   . Postoperative nausea and vomiting    Morphine  . Type 2 diabetes mellitus (HCC)   . UTI (lower urinary tract infection)     Past Surgical History:  Procedure Laterality Date  . ABDOMINAL HYSTERECTOMY     partial  . AORTIC VALVE REPLACEMENT  2006   Dr. Cornelius [facility] - 23 mm [place] stentless cadaver valve  . CORONARY ARTERY BYPASS GRAFT  2006    Dr. Cornelius [facility] - LIMA to LAD, SVG to circumflex  . CORONARY STENT INTERVENTION N/A 11/06/2018   Procedure: CORONARY STENT INTERVENTION;  Surgeon: Yvonne Kendall, MD;  Location: [facility] INVASIVE CV LAB;  Service: Cardiovascular;  Laterality: N/A;  . CORONARY/GRAFT ANGIOGRAPHY N/A 11/06/2018   Procedure: CORONARY/GRAFT ANGIOGRAPHY;  Surgeon: Yvonne Kendall, MD;  Location: [facility] INVASIVE CV LAB;  Service: Cardiovascular;  Laterality: N/A;  . TOTAL HIP ARTHROPLASTY Right 03/01/2017   Procedure: RIGHT TOTAL HIP  ARTHROPLASTY ANTERIOR APPROACH;  Surgeon: Durene Romans, MD;  Location: [facility] ORS;  Service: Orthopedics;  Laterality: Right;  70 mins  . TOTAL KNEE ARTHROPLASTY Right 2006  . TOTAL KNEE ARTHROPLASTY Left 05/20/2014   DR Thurston Hole  . TOTAL KNEE ARTHROPLASTY Left 05/20/2014   Procedure: LEFT TOTAL KNEE ARTHROPLASTY;  Surgeon: Nilda Simmer, MD;  Location: [facility] OR;  Service: Orthopedics;  Laterality: Left;    No current facility-administered medications for this encounter.   Current Outpatient Medications  Medication Sig Dispense Refill Last Dose  . APPLE CIDER VINEGAR PO Take 1 tablet by mouth every morning.     . Cholecalciferol (VITAMIN D3) 125 MCG (5000 UT) CAPS Take 5,000 Units by mouth daily.     Marland Kitchen CINNAMON PO Take 1 tablet by mouth 2 (two) times daily.     . Coenzyme Q10 (COQ10 PO) Take 1 tablet by mouth daily.     . Cyanocobalamin (B-12) 2500 MCG TABS Take 2,500 mcg by mouth daily.     . diclofenac sodium (VOLTAREN) 1 % GEL Apply 1 application topically at bedtime.     . famotidine (PEPCID) 20 MG tablet Take 1 tablet (20 mg total) by mouth 2 (two) times daily. (Patient taking differently: Take 20 mg by mouth daily.) 60 tablet 1   . furosemide (LASIX) 40 MG tablet Take 1 tablet (40 mg total) by mouth 2 (two) times daily. 60 tablet 6   . levothyroxine (SYNTHROID) 88 MCG tablet Take 88 mcg by mouth daily before breakfast.     . losartan (COZAAR) 25 MG tablet Take 0.5 tablets (12.5 mg total) by mouth daily. 45 tablet 3   . metFORMIN (GLUCOPHAGE) 500 MG tablet Take 1 tablet (500 mg total) by mouth 2 (two) times daily with a meal.     . metolazone (ZAROXOLYN) 5 MG tablet Take 1 tablet as needed for swelling (Patient taking differently: Take by mouth daily as needed (swelling). Take 1 tablet as needed for swelling) 30 tablet 0   . metoprolol succinate (TOPROL-XL) 50 MG 24 hr tablet Take 1 tablet (50 mg total) by mouth daily. 90 tablet 3   . Multiple Vitamins-Minerals (HAIR/SKIN/NAILS/BIOTIN) TABS  Take 1 tablet by mouth daily.     . Multiple Vitamins-Minerals (MULTIVITAMIN PO) Take 1 tablet by mouth daily.     Bertram Gala Glycol-Propyl Glycol (SYSTANE OP) Place 1 drop into both eyes 4 (four) times daily as needed (for burning eyes).      . potassium chloride SA (K-DUR,KLOR-CON) 20 MEQ tablet Take 1 tablet (20 mEq total) by mouth daily after supper. 90 tablet 0   . rosuvastatin (CRESTOR) 5 MG tablet Take 5 mg by mouth daily.     . sertraline (ZOLOFT) 100 MG tablet Take 100 mg by mouth at bedtime.     Marland Kitchen spironolactone (ALDACTONE) 25 MG tablet Take 0.5 tablets (12.5 mg total) by mouth daily.  45 tablet 3   . ticagrelor (BRILINTA) 90 MG TABS tablet Take 1 tablet (90 mg total) by mouth 2 (two) times daily. 40 tablet 0   . trimethoprim (TRIMPEX) 100 MG tablet Take 100 mg by mouth daily.     . vitamin C (ASCORBIC ACID) 500 MG tablet Take 500 mg by mouth daily.     . Vitamin E 180 MG (400 UNIT) CAPS Take 400 Units by mouth daily.      Allergies  Allergen Reactions  . Lipitor [Atorvastatin]     MUSCLE ACHES/FATIGUE  . Morphine Nausea And Vomiting  . Penicillins Swelling and Other (See Comments)    Severe arm swelling and redness Did it involve swelling of the face/tongue/throat, SOB, or low BP? No Did it involve sudden or severe rash/hives, skin peeling, or any reaction on the inside of your mouth or nose? No Did you need to seek medical attention at a hospital or doctor's office? No When did it last happen?30 years If all above answers are "NO", may proceed with cephalosporin use.  Marland Kitchen. Reclast [Zoledronic Acid]     "nausea and vomiting. Could not get out of bed.    Social History   Tobacco Use  . Smoking status: Never Smoker  . Smokeless tobacco: Never Used  Substance Use Topics  . Alcohol use: No    Alcohol/week: 0.0 standard drinks    Family History  Problem Relation Age of Onset  . Heart Problems Mother   . Diabetes Father   . Cirrhosis Father   . Diabetes Other       Review of Systems  Constitutional: Negative.   HENT: Negative.   Eyes: Negative.   Respiratory: Negative.   Cardiovascular: Negative.   Gastrointestinal: Positive for heartburn.  Genitourinary: Negative.   Musculoskeletal: Positive for joint pain.  Skin: Negative.   Neurological: Negative.   Endo/Heme/Allergies: Negative.   Psychiatric/Behavioral: Negative.      Objective:  Physical Exam Constitutional:      Appearance: She is well-developed.  HENT:     Head: Normocephalic.  Eyes:     Pupils: Pupils are equal, round, and reactive to light.  Neck:     Thyroid: No thyromegaly.     Vascular: No JVD.     Trachea: No tracheal deviation.  Cardiovascular:     Rate and Rhythm: Normal rate and regular rhythm.     Pulses: Intact distal pulses.     Comments: Valve replacement Pulmonary:     Effort: Pulmonary effort is normal. No respiratory distress.     Breath sounds: Normal breath sounds. No wheezing.  Abdominal:     Palpations: Abdomen is soft.     Tenderness: There is no abdominal tenderness. There is no guarding.  Musculoskeletal:     Cervical back: Neck supple.     Left hip: Tenderness and bony tenderness present. Decreased range of motion. Decreased strength.  Lymphadenopathy:     Cervical: No cervical adenopathy.  Skin:    General: Skin is warm and dry.  Neurological:     Mental Status: She is alert and oriented to person, place, and time.  Psychiatric:        Mood and Affect: Mood and affect normal.       Labs:  Estimated body mass index is 23.21 kg/m as calculated from the following:   Height as of 12/17/19: 5\' 3"  (1.6 m).   Weight as of 12/17/19: 59.4 kg.   Imaging Review Plain radiographs demonstrate severe degenerative joint  disease of the left hip(s). The bone quality appears to be good for age and reported activity level.      Assessment/Plan:  End stage arthritis, left hip(s)  The patient history, physical examination, clinical  judgement of the provider and imaging studies are consistent with end stage degenerative joint disease of the left hip(s) and total hip arthroplasty is deemed medically necessary. The treatment options including medical management, injection therapy, arthroscopy and arthroplasty were discussed at length. The risks and benefits of total hip arthroplasty were presented and reviewed. The risks due to aseptic loosening, infection, stiffness, dislocation/subluxation,  thromboembolic complications and other imponderables were discussed.  The patient acknowledged the explanation, agreed to proceed with the plan and consent was signed. Patient is being admitted for inpatient treatment for surgery, pain control, PT, OT, prophylactic antibiotics, VTE prophylaxis, progressive ambulation and ADL's and discharge planning.The patient is planning to be discharged home .

## 2020-04-23 ENCOUNTER — Encounter (HOSPITAL_COMMUNITY)
Admission: RE | Admit: 2020-04-23 | Discharge: 2020-04-23 | Disposition: A | Discharge status: Home / Self Care | Payer: Medicare Other | Source: Ambulatory Visit | Attending: Orthopedic Surgery | Admitting: Orthopedic Surgery

## 2020-04-23 ENCOUNTER — Other Ambulatory Visit: Payer: Self-pay

## 2020-04-23 ENCOUNTER — Encounter (HOSPITAL_COMMUNITY): Payer: Self-pay

## 2020-04-23 DIAGNOSIS — Z01812 Encounter for preprocedural laboratory examination: Secondary | ICD-10-CM | POA: Diagnosis not present

## 2020-04-23 LAB — CBC
HCT: 36.1 % (ref 36.0–46.0)
Hemoglobin: 11.9 g/dL — ABNORMAL LOW (ref 12.0–15.0)
MCH: 32.2 pg (ref 26.0–34.0)
MCHC: 33 g/dL (ref 30.0–36.0)
MCV: 97.8 fL (ref 80.0–100.0)
Platelets: 250 10*3/uL (ref 150–400)
RBC: 3.69 MIL/uL — ABNORMAL LOW (ref 3.87–5.11)
RDW: 11.9 % (ref 11.5–15.5)
WBC: 8.2 10*3/uL (ref 4.0–10.5)
nRBC: 0 % (ref 0.0–0.2)

## 2020-04-23 LAB — BASIC METABOLIC PANEL
Anion gap: 13 (ref 5–15)
BUN: 37 mg/dL — ABNORMAL HIGH (ref 8–23)
CO2: 26 mmol/L (ref 22–32)
Calcium: 9.5 mg/dL (ref 8.9–10.3)
Chloride: 97 mmol/L — ABNORMAL LOW (ref 98–111)
Creatinine, Ser: 1.04 mg/dL — ABNORMAL HIGH (ref 0.44–1.00)
GFR, Estimated: 51 mL/min — ABNORMAL LOW (ref >60)
Glucose, Bld: 128 mg/dL — ABNORMAL HIGH (ref 70–99)
Potassium: 3.5 mmol/L (ref 3.5–5.1)
Sodium: 136 mmol/L (ref 135–145)

## 2020-04-23 LAB — SURGICAL PCR SCREEN
MRSA, PCR: NEGATIVE
Staphylococcus aureus: NEGATIVE

## 2020-04-23 LAB — GLUCOSE, CAPILLARY: Glucose-Capillary: 127 mg/dL — ABNORMAL HIGH (ref 70–99)

## 2020-04-23 NOTE — Progress Notes (Signed)
COVID Vaccine Completed:Yes Date COVID Vaccine completed:06/29/19- booster 03/13/20 COVID vaccine manufacturer:  Moderna     PCP - Dr. Franchot Mimes Cardiologist -  Dr. Ival Bible  Chest x-ray - no EKG - 12/17/19-epic Stress Test - 2015-epic ECHO - 06/07/19-epic Cardiac Cath - 2020 with stent -epic Pacemaker/ICD device last checked:NA  Sleep Study - no CPAP -   Fasting Blood Sugar - 140-150 Checks Blood Sugar _QD____ times a day  Blood Thinner Instructions:Brilinta/ Dr. Durenda Hurt Aspirin Instructions:Stop 7 days prior to DOS Last Dose:04/23/20  Anesthesia review:   Patient denies shortness of breath, fever, cough and chest pain at PAT appointment  yes Patient verbalized understanding of instructions that were given to them at the PAT appointment. Patient was also instructed that they will need to review over the PAT instructions again at home before surgery. Yes. Pt uses a cane around the house . She doesn't climb stairs but reports no SOB doing housework or with ADLs

## 2020-04-24 NOTE — Progress Notes (Signed)
Anesthesia Chart Review   Case: [id number] Date/Time: 04/29/20 0825   Procedure: TOTAL HIP ARTHROPLASTY ANTERIOR APPROACH (Left Hip) - 70 mins   Anesthesia type: Spinal   Pre-op diagnosis: Left hip osteoarthritis   Location: [facility]OM 10 / [facility]   Surgeons: Durene Romans, MD      DISCUSSION:86 y.o. never smoker with h/o PONV, HTN, GERD, DM II, CKD, LBBB, CAD (CABG 2006, DES to circumflex 10/2018), AS (s/p bioprosthetic AVR 2006), cardiomyopathy, LBBB, left hip OA scheduled for above procedure 04/29/2020 with Dr. Durene Romans.   Per cardiology preoperative risk assessment 03/28/2020, "Chart reviewed as part of pre-operative protocol coverage. Given past medical history and time since last visit, based on ACC/AHA guidelines, Erin Herman would be at acceptable risk for the planned procedure without further cardiovascular testing.   Her Brilinta may be held for 5 days prior to her procedure. Please resume as soon as hemostasis is achieved"  Anticipate pt can proceed with planned procedure barring acute status change.   VS: BP (!) 95/53   Pulse 75   Temp 36.8 C (Oral)   Resp 18   Ht 5\' 5"  (1.651 m)   Wt 58.5 kg   LMP  (LMP Unknown)   SpO2 98%   BMI 21.47 kg/m   PROVIDERS: , MD is PCP   Kirstie Peri, MD is Cardiologist  LABS: Labs reviewed: Acceptable for surgery. (all labs ordered are listed, but only abnormal results are displayed)  Labs Reviewed  BASIC METABOLIC PANEL - Abnormal; Notable for the following components:      Result Value   Chloride 97 (*)    Glucose, Bld 128 (*)    BUN 37 (*)    Creatinine, Ser 1.04 (*)    GFR, Estimated 51 (*)    All other components within normal limits  CBC - Abnormal; Notable for the following components:   RBC 3.69 (*)    Hemoglobin 11.9 (*)    All other components within normal limits  GLUCOSE, CAPILLARY - Abnormal; Notable for the following components:   Glucose-Capillary 127 (*)    All other components within  normal limits  SURGICAL PCR SCREEN  TYPE AND SCREEN     IMAGES:   EKG: 12/17/2019 Rate 72 bpm  Sinus rhythm with 1 degree AV block with occassional premature ventricular complexes  LBBB  CV: Echo 06/07/19 IMPRESSIONS    1. Left ventricular ejection fraction, by estimation, is 35 to 40%. The  left ventricle has moderately decreased function. The left ventrical  demonstrates regional wall motion abnormalities (see scoring  diagram/findings for description). There is mildly  increased asymmetric left ventricular hypertrophy of the posterior  segment. Left ventricular diastolic parameters are consistent with Grade  II diastolic dysfunction (pseudonormalization). Elevated left ventricular  end-diastolic pressure.  2. Right ventricular systolic function is mildly reduced. The right  ventricular size is moderately enlarged. There is mildly elevated  pulmonary artery systolic pressure.  3. Left atrial size was moderately dilated.  4. Right atrial size was mildly dilated.  5. The mitral valve is degenerative. Moderate mitral valve regurgitation.  6. Tricuspid valve regurgitation is mild to moderate.  7. The aortic valve has been repaired/replaced. Aortic valve  regurgitation is not visualized. No aortic stenosis is present. 27mm  [place] bioprosthetic valve valve is present in the aortic position. Echo  findings show normal structure and function of  the aortic prosthesis.  8. The inferior vena cava is normal in size with greater than 50%  respiratory variability, suggesting right atrial pressure of 3 mmHg.   Comparison(s): Echocardiogram done 11/07/18 showed an EF of 35% with  moderate AS and an AV peak Grad of 17 mmHg.   Cardiac Cath 11/06/2018 Conclusions: 1. Severe three-vessel coronary artery disease, including 90% proximal/mid LAD disease, sequential 95% and 50% proximal/mid LCx lesions, long 80% OM1 stenosis, and severe diffuse disease involving non-dominant  RCA. 2. Widely patent LIMA-LAD. 3. Chronically occluded SVG-LCx (most likely grafted to OM1). 4. Successful PCI to 95% proximal LCx stenosis using Resolute Onyx 3.5 x 12 mm drug-eluting stent with 0% residual stenosis and TIMI-3 flow.  Recommendations: 1. Dual antiplatelet therapy with aspirin and ticagrelor for at least 12 months.  Discontinuation of aspirin after 3 months could be considered to reduce bleeding risk. 2. Obtain limited echo to evaluate LVEF in the setting of NSTEMI. 3. Aggressive secondary prevention. 4. Medical therapy for OM1 disease.  If the patient has refractory angina despite maximal tolerated antianginal therapy, PCI could be considered. 5. Gentle post-catheterization hydration given chronic kidney     Myocardial Perfusion 04/23/2014 IMPRESSION: Lexiscan Cardiolite; Electrically xenodiagnostic for ischemia. Cardiolite scan with moderate area of fixed defect in the anteroseptal, inferoseptal and basal septal wall (see above) consistent with scar and possible soft tissue attenuation . No ischemia. LVEF on gating calculated at 67% with septal hypokinesis. Past Medical History:  Diagnosis Date  . Anemia    iron  . Anxiety   . Aortic stenosis    Bioprosthetic AVR 2006  . CAD (coronary artery disease), native coronary artery    Multivessel status post CABG 2006, occluded SVG to circumflex, DES to circumflex July 2020  . Cardiomyopathy (HCC)   . Chronic kidney disease   . Depression   . Dysphagia   . Essential hypertension   . Family history of adverse reaction to anesthesia    N&V  . GERD (gastroesophageal reflux disease)   . Headache   . History of hiatal hernia   . HOH (hard of hearing)   . LBBB (left bundle branch block)   . Mixed hyperlipidemia   . NSTEMI (non-ST elevated myocardial infarction) [facility])    July 2020  . Osteoarthritis   . Postoperative nausea and vomiting    Morphine  . Type 2 diabetes mellitus (HCC)   . UTI (lower urinary tract  infection)     Past Surgical History:  Procedure Laterality Date  . ABDOMINAL HYSTERECTOMY     partial  . AORTIC VALVE REPLACEMENT  2006   Dr. Cornelius Moras - 23 mm [place] stentless cadaver valve  . CARDIAC CATHETERIZATION  2020   with stents  . CHOLECYSTECTOMY    . CORONARY ARTERY BYPASS GRAFT  2006    Dr. Cornelius Moras - LIMA to LAD, SVG to circumflex  . CORONARY STENT INTERVENTION N/A 11/06/2018   Procedure: CORONARY STENT INTERVENTION;  Surgeon: Yvonne Kendall, MD;  [facility];  Service: Cardiovascular;  Laterality: N/A;  . CORONARY/GRAFT ANGIOGRAPHY N/A 11/06/2018   Procedure: CORONARY/GRAFT ANGIOGRAPHY;  Surgeon: Yvonne Kendall, MD;  [facility];  Service: Cardiovascular;  Laterality: N/A;  . THYROID LOBECTOMY Right   . TOTAL HIP ARTHROPLASTY Right 03/01/2017   Procedure: RIGHT TOTAL HIP ARTHROPLASTY ANTERIOR APPROACH;  Surgeon: Durene Romans, MD;  Location[facility];  Service: Orthopedics;  Laterality: Right;  70 mins  . TOTAL KNEE ARTHROPLASTY Right 2006  . TOTAL KNEE ARTHROPLASTY Left 05/20/2014   DR Thurston Hole  . TOTAL KNEE ARTHROPLASTY Left 05/20/2014  Procedure: LEFT TOTAL KNEE ARTHROPLASTY;  Surgeon: Nilda Simmer, MD;  Location[facility];  Service: Orthopedics;  Laterality: Left;    MEDICATIONS: . APPLE CIDER VINEGAR PO  . Cholecalciferol (VITAMIN D3) 125 MCG (5000 UT) CAPS  . CINNAMON PO  . Coenzyme Q10 (COQ10 PO)  . Cyanocobalamin (B-12) 2500 MCG TABS  . diclofenac sodium (VOLTAREN) 1 % GEL  . famotidine (PEPCID) 20 MG tablet  . furosemide (LASIX) 40 MG tablet  . levothyroxine (SYNTHROID) 88 MCG tablet  . losartan (COZAAR) 25 MG tablet  . metFORMIN (GLUCOPHAGE) 500 MG tablet  . metolazone (ZAROXOLYN) 5 MG tablet  . metoprolol succinate (TOPROL-XL) 50 MG 24 hr tablet  . Multiple Vitamins-Minerals (HAIR/SKIN/NAILS/BIOTIN) TABS  . Multiple Vitamins-Minerals (MULTIVITAMIN PO)  . Polyethyl Glycol-Propyl Glycol (SYSTANE OP)  . potassium chloride SA  (K-DUR,KLOR-CON) 20 MEQ tablet  . rosuvastatin (CRESTOR) 5 MG tablet  . sertraline (ZOLOFT) 100 MG tablet  . spironolactone (ALDACTONE) 25 MG tablet  . ticagrelor (BRILINTA) 90 MG TABS tablet  . trimethoprim (TRIMPEX) 100 MG tablet  . vitamin C (ASCORBIC ACID) 500 MG tablet  . Vitamin E 180 MG (400 UNIT) CAPS   No current facility-administered medications for this encounter.     Jodell Cipro, PA-C [facility] [phone]

## 2020-04-24 NOTE — Anesthesia Preprocedure Evaluation (Addendum)
Anesthesia Evaluation  Patient identified by MRN, date of birth, ID band Patient awake    Reviewed: Allergy & Precautions, NPO status , Patient's Chart, lab work & pertinent test results, reviewed documented beta blocker date and time   History of Anesthesia Complications (+) PONV and history of anesthetic complications  Airway Mallampati: I       Dental  (+) Caps, Poor Dentition,    Pulmonary neg pulmonary ROS,    Pulmonary exam normal        Cardiovascular hypertension, Pt. on medications and Pt. 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 CHF and Cardiomyopathy, CAD and Acute  MI,          Prior CABG, CKD, Aortic Valve Disease, Arrythmias:LBBB;  Risk          Factors:Hypertension, Dyslipidemia and Diabetes. Aortic  Valve:          A 23mm [place] bioprosthetic valve    Sonographer:   Jake SeatsAmanda Mcfatter RDMS, RVT, RDCS  Referring Phys: Nona DellMCDOWELL, SAMUEL, G  Diagnosing Phys: Prentice DockerSuresh Koneswaran MD   IMPRESSIONS    1. Left ventricular ejection fraction, by estimation, is 35 to 40%. The  left ventricle has moderately decreased function. The left ventrical  demonstrates regional wall motion abnormalities (see scoring  diagram/findings for description). There is  mildly  increased asymmetric left ventricular hypertrophy of the posterior  segment. Left ventricular diastolic parameters are consistent with Grade  II diastolic dysfunction (pseudonormalization). Elevated left ventricular  end-diastolic pressure.  2. Right ventricular systolic function is mildly reduced. The right  ventricular size is moderately enlarged. There is mildly elevated  pulmonary artery systolic pressure.  3. Left atrial size was moderately dilated.  4. Right atrial size was mildly dilated.  5. The mitral valve is degenerative. Moderate mitral valve regurgitation.  6. Tricuspid valve regurgitation is mild to moderate.  7. The aortic valve has been repaired/replaced. Aortic valve  regurgitation is not visualized. No aortic stenosis is present. 23mm  [place] bioprosthetic valve valve is present in the aortic position. Echo  findings show normal structure and function of  the aortic prosthesis.  8. The inferior vena cava is normal in size with greater than 50%  respiratory variability, suggesting right atrial pressure of 3 mmHg.   Comparison(s): Echocardiogram done 11/07/18 showed an EF of 35% with  moderate AS and an AV peak Grad of 17 mmHg.   FINDINGS  Left Ventricle: Left ventricular ejection fraction, by estimation, is 35  to 40%. The left ventricle has moderately decreased function. The left  ventricle demonstrates regional wall motion abnormalities. There is mildly  increased asymmetric left  ventricular hypertrophy of the posterior segment. Left ventricular  diastolic parameters are consistent with Grade II diastolic dysfunction  (pseudonormalization).     LV Wall Scoring:  The anterior wall, anterior septum,  and mid anterolateral segment are  hypokinetic.   Right Ventricle: The right ventricular size is moderately enlarged. No  increase in right ventricular wall thickness. Right ventricular systolic  function is mildly reduced. There is mildly elevated  pulmonary artery  systolic pressure. The tricuspid  regurgitant velocity is 2.31 m/s, and with an assumed right atrial  pressure of 10 mmHg, the estimated right ventricular systolic pressure is  31.3 mmHg.   Left Atrium: Left atrial size was moderately dilated.   Right Atrium: Right atrial size was mildly dilated.   Pericardium: There is no evidence of pericardial effusion.   Mitral Valve: The mitral valve is degenerative in appearance. There is  mild thickening of the mitral valve leaflet(s). Mild to moderate mitral  annular calcification. Moderate mitral valve regurgitation.   Tricuspid Valve: The tricuspid valve is grossly normal. Tricuspid valve  regurgitation is mild to moderate.   Aortic Valve: The aortic valve has been repaired/replaced. Aortic valve  regurgitation is not visualized. No aortic stenosis is present. Aortic  valve mean gradient measures 9.8 mmHg. Aortic valve peak gradient measures  15.8 mmHg. Aortic valve area, by VTI  measures 0.76 cm. 64mm [place] bioprosthetic valve valve is present in  the aortic position. Echo findings show normal structure and function of  the aortic prosthesis.   Pulmonic Valve: The pulmonic valve was grossly normal. Pulmonic valve  regurgitation is mild.   Aorta: The aortic root is normal in size and structure.   Venous: The inferior vena cava is normal in size with greater than 50%  respiratory variability, suggesting right atrial pressure of 3 mmHg.   IAS/Shunts: No atrial level shunt detected by color flow Doppler.     LEFT VENTRICLE  PLAX 2D  LVIDd:     4.38 cm    Diastology  LVIDs:     2.75 cm    LV e' lateral:  6.87 cm/s  LV PW:     1.11 cm    LV E/e' lateral: 15.9  LV IVS:    0.79 cm    LV e' medial:  5.71 cm/s  LVOT diam:   1.50 cm    LV E/e' medial: 19.1  LV SV:     35.34 ml  LV SV Index:  34.93  LVOT Area:   1.77 cm    LV Volumes (MOD)  LV area d, A2C:  22.20  cm  LV area d, A4C:  22.35 cm  LV area s, A2C:  15.20 cm  LV area s, A4C:  15.75 cm  LV major d, A2C:  6.75 cm  LV major d, A4C:  6.60 cm  LV major s, A2C:  6.00 cm  LV major s, A4C:  5.96 cm  LV vol d, MOD A2C: 64.6 ml  LV vol d, MOD A4C: 65.0 ml  LV vol s, MOD A2C: 34.2 ml  LV vol s, MOD A4C: 36.7 ml  LV SV MOD A2C:   30.4 ml  LV SV MOD A4C:   65.0 ml  LV SV MOD BP:   29.4 ml   RIGHT VENTRICLE  RV S prime:   7.45 cm/s  TAPSE (M-mode): 1.2 cm   LEFT ATRIUM      Index  LA diam:   3.60 cm 2.17 cm/m  LA Vol (A2C): 63.2 ml 38.08 ml/m  LA Vol (A4C): 65.8 ml 39.65 ml/m  AORTIC VALVE  AV Area (Vmax):  0.86 cm  AV Area (Vmean):  0.87 cm  AV Area (VTI):  0.76 cm  AV Vmax:      199.00 cm/s  AV Vmean:     147.800 cm/s  AV VTI:      0.467 m  AV Peak Grad:   15.8 mmHg  AV Mean Grad:   9.8 mmHg  LVOT Vmax:     96.70 cm/s  LVOT Vmean:    72.700 cm/s  LVOT VTI:     0.200 m  LVOT/AV VTI ratio: 0.43    AORTA  Ao Root diam: 3.00 cm   MITRAL VALVE             TRICUSPID VALVE  MV Area (PHT): 4.23 cm       TR Peak grad:  21.3 mmHg  MV Decel Time: 180 msec       TR Vmax:    231.00 cm/s  MR Peak grad: 62.4 mmHg  MR Vmax:   395.00 cm/s      SHUNTS  MV E velocity: 109.00 cm/s 103 cm/s Systemic VTI: 0.20 m  MV A velocity: 100.75 cm/s 70.3 cm/s Systemic Diam: 1.50 cm  MV E/A ratio: 1.08    1.5   Prentice Docker MD  Electronically signed by Prentice Docker MD  Signature Date/Time: 06/07/2019/4:53:15 PM      Final    Imaging Info  ECHOCARDIOGRAM COMPLETE (Order #[id number]) on 06/07/19  Study History  ECHOCARDIOGRAM COMPLETE (Order #[id number]) on 06/07/19  Syngo Images  Show images for ECHOCARDIOGRAM COMPLETE  Images on [employer]  Show images for Churilla, Deeanne  Performing Technologist/Nurse  Performing Technologist/Nurse:  McFatter, Franki Cabot   Reason for Exam Priority: Routine Dx: Secondary cardiomyopathy (HCC) (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 Cornelius Moras - LIMA to LAD, SVG to circumflex    Other Surgical History   Procedure Laterality Date Comment Source ABDOMINAL HYSTERECTOMY   partial  AORTIC VALVE REPLACEMENT  2006 Dr. Cornelius Moras - 23 mm [place] stentless cadaver valve  CHOLECYSTECTOMY     CORONARY STENT INTERVENTION N/A 11/06/2018 Procedure: CORONARY STENT INTERVENTION; Surgeon: Yvonne Kendall, MD; [facility]; Service: Cardiovascular; Laterality: N/A;  CORONARY/GRAFT ANGIOGRAPHY N/A 11/06/2018 Procedure: CORONARY/GRAFT ANGIOGRAPHY; Surgeon: Yvonne Kendall, MD; [facility]; Service: Cardiovascular; Laterality: N/A;  THYROID LOBECTOMY Right    TOTAL HIP ARTHROPLASTY Right 03/01/2017 Procedure: RIGHT TOTAL HIP ARTHROPLASTY ANTERIOR APPROACH; Surgeon: Durene Romans, MD; Location: [facility]; Service: Orthopedics; Laterality: Right; 70 mins  TOTAL KNEE ARTHROPLASTY Right 2006   TOTAL KNEE ARTHROPLASTY Left 05/20/2014 DR Thurston Hole  TOTAL KNEE ARTHROPLASTY Left 05/20/2014 Procedure: LEFT TOTAL KNEE ARTHROPLASTY; Surgeon: Nilda Simmer, MD; Location: [facility] OR; Service: Orthopedics; Laterality: Left;    Implants    Permanent Stent  Stent Resolute Onyx 3.5x12 - [id number] - Implanted   Inventory item: Dreama Saa 3.5X12 Model/Cat number: [id number] Manufacturer: [employer] Lot number: [id number] Device identifier: [id number] Device identifier type: GS1  As of 11/06/2018   Status: Implanted    Implant  David Stall Rev Tray Mbt Knee - [id number] - Capitated Charge Entry (Left) Knee  Inventory item: David Stall REV TRAY MBT KNEE Model/Cat number: [id number] Manufacturer: DEPUY  SYNTHES    As of 05/20/2014   Status: Capitated Charge Entry     Cap Knee Total 3 - [id number] - Capitated Charge Entry (Left) Knee  Inventory item: CAP KNEE TOTAL 3 SIGMA Model/Cat number: [id number] Manufacturer: Alcide Clever  As of 05/20/2014   Status: Capitated Charge Entry     Tri Loc Gription Sz 5 Std - [id number] - Implanted (Right) Hip  Inventory item: TRI LOC GRIPTION SZ 5 STD Model/Cat number: [id number] Manufacturer: Alcide Clever Lot number: [id number]  As of 03/01/2017   Status: Implanted     Capt Hip Total 2 - [id number] - Capitated Charge Entry (Right) Hip  Inventory item: CAPT HIP TOTAL 2 Model/Cat number: [id number] Manufacturer: DEPUY SYNTHES    As of 03/01/2017   Status: Capitated Charge Entry     Encounter-Level Documents on 06/07/2019:  Electronic signature on 06/07/2019 2:08 PM - E-signed    Order-Level Documents on 06/07/2019:  Scan on 06/07/2019 4:54 PM by Default, Provider, MD    Resulted by:  Signed Date/Time  Phone Pager Prentice Docker A 06/07/2019 4:53 PM    External Result Report  External Result Report  ECHOCARDIOGRAM COMPLETE: Patient Communication  Add Comments Add Notifications   Existing Museum/gallery curator Code Status Charge Trigger Charge Type [id number] ECHO HEART Rusty Aus DOPPLER [id number] [facility] - Resolute Professional Billing Imaging result study Professional    Reproductive/Obstetrics                                                            Anesthesia Evaluation  Patient identified by MRN, date of birth, ID band Patient awake    Reviewed: Allergy & Precautions, NPO status , Patient's Chart, lab work & pertinent test results  History of Anesthesia Complications (+) PONV  Airway Mallampati: II  TM Distance: >3 FB Neck ROM: Full    Dental   Pulmonary neg pulmonary ROS,    Pulmonary exam normal        Cardiovascular hypertension, +  CAD, + Cardiac Stents, + Peripheral Vascular Disease and +CHF  + dysrhythmias (LBBB)  Rhythm:Regular  S/p CABG and bioprosthetic AV replacement in 2006. 07/2013 Echo EF 50-55%, Normal functioning AV.   Neuro/Psych negative neurological ROS     GI/Hepatic Neg liver ROS, hiatal hernia, GERD  ,  Endo/Other  diabetes, Type 2Hypothyroidism   Renal/GU Renal InsufficiencyRenal diseasenegative Renal ROS     Musculoskeletal  (+) Arthritis ,   Abdominal   Peds  Hematology  (+) anemia ,   Anesthesia Other Findings   Reproductive/Obstetrics                            Anesthesia Physical  Anesthesia Plan  ASA: III  Anesthesia Plan: Spinal   Post-op Pain Management:  Regional for Post-op pain   Induction: Intravenous  PONV Risk Score and Plan: 3  Airway Management Planned: Natural Airway and Simple Face Mask  Additional Equipment:   Intra-op Plan:   Post-operative Plan:   Informed Consent: I have reviewed the patients History and Physical, chart, labs and discussed the procedure including the risks, benefits and alternatives for the proposed anesthesia with the patient or authorized representative who has indicated his/her understanding and acceptance.   Dental advisory given  Plan Discussed with: CRNA  Anesthesia Plan Comments:         Anesthesia Quick Evaluation  Anesthesia Physical Anesthesia Plan  ASA: III  Anesthesia Plan: Spinal   Post-op Pain Management:    Induction:   PONV Risk  Score and Plan: 3 and Ondansetron and Dexamethasone  Airway Management Planned: Natural Airway and Simple Face Mask  Additional Equipment: None  Intra-op Plan:   Post-operative Plan:   Informed Consent: I have reviewed the patients History and Physical, chart, labs and discussed the procedure including the risks, benefits and alternatives for the proposed anesthesia with the patient or authorized representative who has indicated his/her  understanding and acceptance.       Plan Discussed with: CRNA  Anesthesia Plan Comments: (See PAT note 04/23/2020, Jodell Cipro, PA-C)      Anesthesia Quick Evaluation

## 2020-04-28 ENCOUNTER — Other Ambulatory Visit (HOSPITAL_COMMUNITY): Payer: Medicare Other

## 2020-04-29 ENCOUNTER — Encounter (HOSPITAL_COMMUNITY): Payer: Self-pay | Admitting: Orthopedic Surgery

## 2020-04-29 ENCOUNTER — Ambulatory Visit (HOSPITAL_COMMUNITY): Payer: Medicare Other | Admitting: Certified Registered Nurse Anesthetist

## 2020-04-29 ENCOUNTER — Ambulatory Visit (HOSPITAL_COMMUNITY): Payer: Medicare Other

## 2020-04-29 ENCOUNTER — Other Ambulatory Visit: Payer: Self-pay

## 2020-04-29 ENCOUNTER — Observation Stay (HOSPITAL_COMMUNITY)
Admission: RE | Admit: 2020-04-29 | Discharge: 2020-04-30 | Disposition: A | Discharge status: Home / Self Care | Payer: Medicare Other | Attending: Orthopedic Surgery | Admitting: Orthopedic Surgery

## 2020-04-29 ENCOUNTER — Encounter (HOSPITAL_COMMUNITY)
Admission: RE | Disposition: A | Discharge status: Home / Self Care | Payer: Self-pay | Source: Home / Self Care | Attending: Orthopedic Surgery

## 2020-04-29 ENCOUNTER — Observation Stay (HOSPITAL_COMMUNITY): Payer: Medicare Other

## 2020-04-29 ENCOUNTER — Ambulatory Visit (HOSPITAL_COMMUNITY): Payer: Medicare Other | Admitting: Physician Assistant

## 2020-04-29 DIAGNOSIS — E119 Type 2 diabetes mellitus without complications: Secondary | ICD-10-CM | POA: Insufficient documentation

## 2020-04-29 DIAGNOSIS — Z955 Presence of coronary angioplasty implant and graft: Secondary | ICD-10-CM | POA: Diagnosis not present

## 2020-04-29 DIAGNOSIS — Z79899 Other long term (current) drug therapy: Secondary | ICD-10-CM | POA: Diagnosis not present

## 2020-04-29 DIAGNOSIS — Z96653 Presence of artificial knee joint, bilateral: Secondary | ICD-10-CM | POA: Insufficient documentation

## 2020-04-29 DIAGNOSIS — I129 Hypertensive chronic kidney disease with stage 1 through stage 4 chronic kidney disease, or unspecified chronic kidney disease: Secondary | ICD-10-CM | POA: Diagnosis not present

## 2020-04-29 DIAGNOSIS — Z20822 Contact with and (suspected) exposure to covid-19: Secondary | ICD-10-CM | POA: Diagnosis not present

## 2020-04-29 DIAGNOSIS — Z951 Presence of aortocoronary bypass graft: Secondary | ICD-10-CM | POA: Insufficient documentation

## 2020-04-29 DIAGNOSIS — I2510 Atherosclerotic heart disease of native coronary artery without angina pectoris: Secondary | ICD-10-CM | POA: Diagnosis not present

## 2020-04-29 DIAGNOSIS — Z96642 Presence of left artificial hip joint: Secondary | ICD-10-CM

## 2020-04-29 DIAGNOSIS — N189 Chronic kidney disease, unspecified: Secondary | ICD-10-CM | POA: Diagnosis not present

## 2020-04-29 DIAGNOSIS — M1612 Unilateral primary osteoarthritis, left hip: Secondary | ICD-10-CM | POA: Diagnosis present

## 2020-04-29 DIAGNOSIS — Z96649 Presence of unspecified artificial hip joint: Secondary | ICD-10-CM

## 2020-04-29 DIAGNOSIS — Z7984 Long term (current) use of oral hypoglycemic drugs: Secondary | ICD-10-CM | POA: Insufficient documentation

## 2020-04-29 DIAGNOSIS — Z96641 Presence of right artificial hip joint: Secondary | ICD-10-CM | POA: Diagnosis not present

## 2020-04-29 DIAGNOSIS — S82122A Displaced fracture of lateral condyle of left tibia, initial encounter for closed fracture: Secondary | ICD-10-CM

## 2020-04-29 HISTORY — PX: TOTAL HIP ARTHROPLASTY: SHX124

## 2020-04-29 LAB — TYPE AND SCREEN
ABO/RH(D): A POS
Antibody Screen: NEGATIVE

## 2020-04-29 LAB — SARS CORONAVIRUS 2 BY RT PCR (HOSPITAL ORDER, PERFORMED IN ~~LOC~~ HOSPITAL LAB): SARS Coronavirus 2: NEGATIVE

## 2020-04-29 LAB — GLUCOSE, CAPILLARY: Glucose-Capillary: 140 mg/dL — ABNORMAL HIGH (ref 70–99)

## 2020-04-29 SURGERY — ARTHROPLASTY, HIP, TOTAL, ANTERIOR APPROACH
Anesthesia: Spinal | Site: Hip | Laterality: Left

## 2020-04-29 MED ORDER — FAMOTIDINE 20 MG PO TABS
20.0000 mg | ORAL_TABLET | Freq: Every day | ORAL | Status: DC
  Administered 2020-04-29 – 2020-04-30 (×2): 20 mg via ORAL
  Filled 2020-04-29 (×2): qty 1

## 2020-04-29 MED ORDER — DEXAMETHASONE SODIUM PHOSPHATE 10 MG/ML IJ SOLN: 10.0000 mg | Freq: Once | INTRAMUSCULAR | Status: AC

## 2020-04-29 MED ORDER — LOSARTAN POTASSIUM 25 MG PO TABS: 12.5000 mg | ORAL_TABLET | Freq: Every day | ORAL | Status: DC

## 2020-04-29 MED ORDER — SODIUM CHLORIDE 0.9 % IV SOLN: INTRAVENOUS | Status: DC

## 2020-04-29 MED ORDER — LACTATED RINGERS IV SOLN: INTRAVENOUS | Status: DC | PRN

## 2020-04-29 MED ORDER — PHENYLEPHRINE HCL (PRESSORS) 10 MG/ML IV SOLN
INTRAVENOUS | Status: AC
  Filled 2020-04-29: qty 1

## 2020-04-29 MED ORDER — PHENYLEPHRINE HCL-NACL 10-0.9 MG/250ML-% IV SOLN
INTRAVENOUS | Status: DC | PRN
  Administered 2020-04-29: 50 ug/min via INTRAVENOUS

## 2020-04-29 MED ORDER — HYDROCODONE-ACETAMINOPHEN 5-325 MG PO TABS
1.0000 | ORAL_TABLET | ORAL | Status: DC | PRN
  Administered 2020-04-29 (×2): 1 via ORAL
  Administered 2020-04-30: 2 via ORAL
  Administered 2020-04-30 (×2): 1 via ORAL
  Filled 2020-04-29 (×2): qty 1
  Filled 2020-04-29: qty 2
  Filled 2020-04-29: qty 1
  Filled 2020-04-29: qty 2

## 2020-04-29 MED ORDER — MAGNESIUM CITRATE PO SOLN: 1.0000 | Freq: Once | ORAL | Status: DC | PRN

## 2020-04-29 MED ORDER — ACETAMINOPHEN 10 MG/ML IV SOLN
INTRAVENOUS | Status: AC
  Filled 2020-04-29: qty 100

## 2020-04-29 MED ORDER — MENTHOL 3 MG MT LOZG: 1.0000 | LOZENGE | OROMUCOSAL | Status: DC | PRN

## 2020-04-29 MED ORDER — MEPERIDINE HCL 50 MG/ML IJ SOLN: 6.2500 mg | INTRAMUSCULAR | Status: DC | PRN

## 2020-04-29 MED ORDER — FENTANYL CITRATE (PF) 100 MCG/2ML IJ SOLN
INTRAMUSCULAR | Status: AC
  Filled 2020-04-29: qty 2

## 2020-04-29 MED ORDER — PHENOL 1.4 % MT LIQD: 1.0000 | OROMUCOSAL | Status: DC | PRN

## 2020-04-29 MED ORDER — ONDANSETRON HCL 4 MG/2ML IJ SOLN
INTRAMUSCULAR | Status: AC
  Filled 2020-04-29: qty 2

## 2020-04-29 MED ORDER — LIDOCAINE HCL (PF) 2 % IJ SOLN
INTRAMUSCULAR | Status: AC
  Filled 2020-04-29: qty 5

## 2020-04-29 MED ORDER — METOCLOPRAMIDE HCL 5 MG/ML IJ SOLN: 5.0000 mg | Freq: Three times a day (TID) | INTRAMUSCULAR | Status: DC | PRN

## 2020-04-29 MED ORDER — ACETAMINOPHEN 325 MG PO TABS: 325.0000 mg | ORAL_TABLET | Freq: Four times a day (QID) | ORAL | Status: DC | PRN

## 2020-04-29 MED ORDER — 0.9 % SODIUM CHLORIDE (POUR BTL) OPTIME
TOPICAL | Status: DC | PRN
  Administered 2020-04-29: 1000 mL

## 2020-04-29 MED ORDER — SERTRALINE HCL 100 MG PO TABS
100.0000 mg | ORAL_TABLET | Freq: Every day | ORAL | Status: DC
  Administered 2020-04-29: 100 mg via ORAL
  Filled 2020-04-29: qty 1

## 2020-04-29 MED ORDER — ALUM & MAG HYDROXIDE-SIMETH 200-200-20 MG/5ML PO SUSP: 15.0000 mL | ORAL | Status: DC | PRN

## 2020-04-29 MED ORDER — CEFAZOLIN SODIUM-DEXTROSE 2-4 GM/100ML-% IV SOLN
2.0000 g | INTRAVENOUS | Status: AC
  Administered 2020-04-29: 2 g via INTRAVENOUS
  Filled 2020-04-29: qty 100

## 2020-04-29 MED ORDER — FENTANYL CITRATE (PF) 100 MCG/2ML IJ SOLN
25.0000 ug | INTRAMUSCULAR | Status: DC | PRN
  Administered 2020-04-29: 25 ug via INTRAVENOUS
  Filled 2020-04-29: qty 2

## 2020-04-29 MED ORDER — STERILE WATER FOR IRRIGATION IR SOLN
Status: DC | PRN
  Administered 2020-04-29: 2000 mL

## 2020-04-29 MED ORDER — ONDANSETRON HCL 4 MG/2ML IJ SOLN
INTRAMUSCULAR | Status: DC | PRN
  Administered 2020-04-29: 4 mg via INTRAVENOUS

## 2020-04-29 MED ORDER — FENTANYL CITRATE (PF) 100 MCG/2ML IJ SOLN
INTRAMUSCULAR | Status: DC | PRN
  Administered 2020-04-29 (×3): 50 ug via INTRAVENOUS

## 2020-04-29 MED ORDER — METHOCARBAMOL 500 MG PO TABS
500.0000 mg | ORAL_TABLET | Freq: Four times a day (QID) | ORAL | Status: DC | PRN
  Administered 2020-04-29 (×2): 500 mg via ORAL
  Filled 2020-04-29 (×2): qty 1

## 2020-04-29 MED ORDER — METOPROLOL SUCCINATE ER 50 MG PO TB24
50.0000 mg | ORAL_TABLET | Freq: Every day | ORAL | Status: DC
  Administered 2020-04-30: 50 mg via ORAL
  Filled 2020-04-29: qty 1

## 2020-04-29 MED ORDER — METFORMIN HCL 500 MG PO TABS
500.0000 mg | ORAL_TABLET | Freq: Two times a day (BID) | ORAL | Status: DC
  Administered 2020-04-30: 500 mg via ORAL
  Filled 2020-04-29: qty 1

## 2020-04-29 MED ORDER — POTASSIUM CHLORIDE CRYS ER 20 MEQ PO TBCR
20.0000 meq | EXTENDED_RELEASE_TABLET | Freq: Every day | ORAL | Status: DC
  Administered 2020-04-29: 20 meq via ORAL
  Filled 2020-04-29: qty 1

## 2020-04-29 MED ORDER — ORAL CARE MOUTH RINSE: 15.0000 mL | Freq: Once | OROMUCOSAL | Status: AC

## 2020-04-29 MED ORDER — POLYETHYLENE GLYCOL 3350 17 G PO PACK
17.0000 g | PACK | Freq: Two times a day (BID) | ORAL | Status: DC
Start: 2020-04-29 — End: 2020-04-30
  Administered 2020-04-29 – 2020-04-30 (×2): 17 g via ORAL
  Filled 2020-04-29: qty 1

## 2020-04-29 MED ORDER — ONDANSETRON HCL 4 MG/2ML IJ SOLN: 4.0000 mg | Freq: Once | INTRAMUSCULAR | Status: DC | PRN

## 2020-04-29 MED ORDER — LACTATED RINGERS IV SOLN: INTRAVENOUS | Status: DC

## 2020-04-29 MED ORDER — BISACODYL 10 MG RE SUPP: 10.0000 mg | Freq: Every day | RECTAL | Status: DC | PRN

## 2020-04-29 MED ORDER — PROPOFOL 10 MG/ML IV BOLUS
INTRAVENOUS | Status: DC | PRN
  Administered 2020-04-29: 130 mg via INTRAVENOUS

## 2020-04-29 MED ORDER — FENTANYL CITRATE (PF) 100 MCG/2ML IJ SOLN
25.0000 ug | INTRAMUSCULAR | Status: DC | PRN
  Administered 2020-04-29 (×2): 50 ug via INTRAVENOUS

## 2020-04-29 MED ORDER — DIPHENHYDRAMINE HCL 12.5 MG/5ML PO ELIX: 12.5000 mg | ORAL_SOLUTION | ORAL | Status: DC | PRN

## 2020-04-29 MED ORDER — DEXAMETHASONE SODIUM PHOSPHATE 10 MG/ML IJ SOLN
INTRAMUSCULAR | Status: AC
  Filled 2020-04-29: qty 1

## 2020-04-29 MED ORDER — PROPOFOL 1000 MG/100ML IV EMUL
INTRAVENOUS | Status: AC
  Filled 2020-04-29: qty 100

## 2020-04-29 MED ORDER — METHOCARBAMOL 500 MG IVPB - SIMPLE MED
500.0000 mg | Freq: Four times a day (QID) | INTRAVENOUS | Status: DC | PRN
  Filled 2020-04-29: qty 50

## 2020-04-29 MED ORDER — ONDANSETRON HCL 4 MG PO TABS: 4.0000 mg | ORAL_TABLET | Freq: Four times a day (QID) | ORAL | Status: DC | PRN

## 2020-04-29 MED ORDER — TRIMETHOPRIM 100 MG PO TABS
100.0000 mg | ORAL_TABLET | Freq: Every day | ORAL | Status: DC
  Filled 2020-04-29: qty 1

## 2020-04-29 MED ORDER — LIDOCAINE 2% (20 MG/ML) 5 ML SYRINGE
INTRAMUSCULAR | Status: DC | PRN
  Administered 2020-04-29: 40 mg via INTRAVENOUS

## 2020-04-29 MED ORDER — FUROSEMIDE 40 MG PO TABS
40.0000 mg | ORAL_TABLET | Freq: Two times a day (BID) | ORAL | Status: DC
  Filled 2020-04-29: qty 1

## 2020-04-29 MED ORDER — LEVOTHYROXINE SODIUM 88 MCG PO TABS
88.0000 ug | ORAL_TABLET | Freq: Every day | ORAL | Status: DC
  Administered 2020-04-30: 88 ug via ORAL
  Filled 2020-04-29: qty 1

## 2020-04-29 MED ORDER — DOCUSATE SODIUM 100 MG PO CAPS
100.0000 mg | ORAL_CAPSULE | Freq: Two times a day (BID) | ORAL | Status: DC
  Administered 2020-04-29 – 2020-04-30 (×2): 100 mg via ORAL
  Filled 2020-04-29 (×2): qty 1

## 2020-04-29 MED ORDER — CEFAZOLIN SODIUM-DEXTROSE 1-4 GM/50ML-% IV SOLN
1.0000 g | Freq: Four times a day (QID) | INTRAVENOUS | Status: AC
  Administered 2020-04-29 (×2): 1 g via INTRAVENOUS
  Filled 2020-04-29 (×2): qty 50

## 2020-04-29 MED ORDER — TRANEXAMIC ACID-NACL 1000-0.7 MG/100ML-% IV SOLN
1000.0000 mg | Freq: Once | INTRAVENOUS | Status: AC
  Administered 2020-04-29: 1000 mg via INTRAVENOUS
  Filled 2020-04-29: qty 100

## 2020-04-29 MED ORDER — ACETAMINOPHEN 10 MG/ML IV SOLN
1000.0000 mg | Freq: Once | INTRAVENOUS | Status: DC | PRN
  Administered 2020-04-29: 1000 mg via INTRAVENOUS

## 2020-04-29 MED ORDER — TICAGRELOR 90 MG PO TABS
90.0000 mg | ORAL_TABLET | Freq: Two times a day (BID) | ORAL | Status: DC
  Administered 2020-04-30: 90 mg via ORAL
  Filled 2020-04-29 (×2): qty 1

## 2020-04-29 MED ORDER — SPIRONOLACTONE 12.5 MG HALF TABLET
12.5000 mg | ORAL_TABLET | Freq: Every day | ORAL | Status: DC
  Administered 2020-04-30: 12.5 mg via ORAL
  Filled 2020-04-29: qty 1

## 2020-04-29 MED ORDER — TRANEXAMIC ACID-NACL 1000-0.7 MG/100ML-% IV SOLN
1000.0000 mg | INTRAVENOUS | Status: AC
  Administered 2020-04-29: 1000 mg via INTRAVENOUS
  Filled 2020-04-29: qty 100

## 2020-04-29 MED ORDER — ONDANSETRON HCL 4 MG/2ML IJ SOLN: 4.0000 mg | Freq: Four times a day (QID) | INTRAMUSCULAR | Status: DC | PRN

## 2020-04-29 MED ORDER — DEXAMETHASONE SODIUM PHOSPHATE 10 MG/ML IJ SOLN
10.0000 mg | Freq: Once | INTRAMUSCULAR | Status: AC
  Administered 2020-04-30: 10 mg via INTRAVENOUS
  Filled 2020-04-29: qty 1

## 2020-04-29 MED ORDER — CELECOXIB 200 MG PO CAPS
200.0000 mg | ORAL_CAPSULE | Freq: Two times a day (BID) | ORAL | Status: DC
  Administered 2020-04-29: 200 mg via ORAL
  Filled 2020-04-29 (×2): qty 1

## 2020-04-29 MED ORDER — CHLORHEXIDINE GLUCONATE 0.12 % MT SOLN
15.0000 mL | Freq: Once | OROMUCOSAL | Status: AC
  Administered 2020-04-29: 15 mL via OROMUCOSAL

## 2020-04-29 MED ORDER — METOCLOPRAMIDE HCL 5 MG PO TABS: 5.0000 mg | ORAL_TABLET | Freq: Three times a day (TID) | ORAL | Status: DC | PRN

## 2020-04-29 MED ORDER — FERROUS SULFATE 325 (65 FE) MG PO TABS
325.0000 mg | ORAL_TABLET | Freq: Three times a day (TID) | ORAL | Status: DC
  Administered 2020-04-30: 325 mg via ORAL
  Filled 2020-04-29: qty 1

## 2020-04-29 MED ORDER — ROSUVASTATIN CALCIUM 5 MG PO TABS
5.0000 mg | ORAL_TABLET | Freq: Every day | ORAL | Status: DC
  Administered 2020-04-29: 5 mg via ORAL
  Filled 2020-04-29: qty 1

## 2020-04-29 SURGICAL SUPPLY — 47 items
ADH SKN CLS APL DERMABOND .7 (GAUZE/BANDAGES/DRESSINGS) ×1
BAG DECANTER FOR FLEXI CONT (MISCELLANEOUS) IMPLANT
BAG SPEC THK2 15X12 ZIP CLS (MISCELLANEOUS)
BAG ZIPLOCK 12X15 (MISCELLANEOUS) IMPLANT
BLADE SAG 18X100X1.27 (BLADE) ×2 IMPLANT
BLADE SURG SZ10 CARB STEEL (BLADE) ×4 IMPLANT
COVER PERINEAL POST (MISCELLANEOUS) ×2 IMPLANT
COVER SURGICAL LIGHT HANDLE (MISCELLANEOUS) ×2 IMPLANT
COVER WAND RF STERILE (DRAPES) IMPLANT
CUP ACETBLR 52 OD PINNACLE (Hips) ×1 IMPLANT
DERMABOND ADVANCED (GAUZE/BANDAGES/DRESSINGS) ×1
DERMABOND ADVANCED .7 DNX12 (GAUZE/BANDAGES/DRESSINGS) ×1 IMPLANT
DRAPE STERI IOBAN 125X83 (DRAPES) ×2 IMPLANT
DRAPE U-SHAPE 47X51 STRL (DRAPES) ×4 IMPLANT
DRESSING AQUACEL AG SP 3.5X10 (GAUZE/BANDAGES/DRESSINGS) ×1 IMPLANT
DRSG AQUACEL AG ADV 3.5X10 (GAUZE/BANDAGES/DRESSINGS) ×1 IMPLANT
DRSG AQUACEL AG SP 3.5X10 (GAUZE/BANDAGES/DRESSINGS) ×2
DURAPREP 26ML APPLICATOR (WOUND CARE) ×2 IMPLANT
ELECT REM PT RETURN 15FT ADLT (MISCELLANEOUS) ×2 IMPLANT
ELIMINATOR HOLE APEX DEPUY (Hips) ×1 IMPLANT
GLOVE BIOGEL PI IND STRL 7.5 (GLOVE) ×1 IMPLANT
GLOVE BIOGEL PI IND STRL 8.5 (GLOVE) ×1 IMPLANT
GLOVE BIOGEL PI INDICATOR 7.5 (GLOVE) ×1
GLOVE BIOGEL PI INDICATOR 8.5 (GLOVE) ×1
GLOVE ECLIPSE 8.0 STRL XLNG CF (GLOVE) ×4 IMPLANT
GLOVE ORTHO TXT STRL SZ7.5 (GLOVE) ×4 IMPLANT
GLOVE SURG ENC MOIS LTX SZ6 (GLOVE) ×4 IMPLANT
GLOVE SURG UNDER POLY LF SZ6.5 (GLOVE) ×2 IMPLANT
GOWN STRL REUS W/TWL LRG LVL3 (GOWN DISPOSABLE) ×4 IMPLANT
GOWN STRL REUS W/TWL XL LVL3 (GOWN DISPOSABLE) ×2 IMPLANT
HEAD M SROM 36MM PLUS 1.5 (Hips) IMPLANT
HOLDER FOLEY CATH W/STRAP (MISCELLANEOUS) ×2 IMPLANT
KIT TURNOVER KIT A (KITS) IMPLANT
LINER NEUTRAL 52X36MM PLUS 4 (Liner) ×1 IMPLANT
PACK ANTERIOR HIP CUSTOM (KITS) ×2 IMPLANT
PENCIL SMOKE EVACUATOR (MISCELLANEOUS) IMPLANT
SCREW PINN CAN BONE 6.5MMX15MM (Screw) ×1 IMPLANT
SROM M HEAD 36MM PLUS 1.5 (Hips) ×2 IMPLANT
STEM FEMORAL SZ 5MM STD ACTIS (Stem) ×1 IMPLANT
SUT MNCRL AB 4-0 PS2 18 (SUTURE) ×2 IMPLANT
SUT STRATAFIX 0 PDS 27 VIOLET (SUTURE) ×2
SUT VIC AB 1 CT1 36 (SUTURE) ×6 IMPLANT
SUT VIC AB 2-0 CT1 27 (SUTURE) ×4
SUT VIC AB 2-0 CT1 TAPERPNT 27 (SUTURE) ×2 IMPLANT
SUTURE STRATFX 0 PDS 27 VIOLET (SUTURE) ×1 IMPLANT
TRAY FOLEY MTR SLVR 16FR STAT (SET/KITS/TRAYS/PACK) IMPLANT
WATER STERILE IRR 1000ML POUR (IV SOLUTION) ×2 IMPLANT

## 2020-04-29 NOTE — Evaluation (Signed)
Physical Therapy Evaluation Patient Details Name: Erin Herman MRN: [id number] DOB: Jan 10, 1928 Today's Date: 04/29/2020   History of Present Illness  Patient is 87 y.o. female s/p Lt THA anterior approach on 04/29/20 with PMH significant for DM, NSTEMI, OA, GERD, HOH, CKD, CAD, anxiety, HTN, depression, anxiety, Bil TKA, Rt THA in 2018, CABG in 2006.    Clinical Impression  Erin Herman is a 87 y.o. female POD 0 s/p Lt THA. Patient reports modified independence with quad cane for mobility at baseline. Patient is now limited by functional impairments (see PT problem list below) and requires min assist for transfers and gait with RW. Patient was able to ambulate ~30 feet with RW and min assist. Patient instructed in exercise to facilitate circulation. Patient will benefit from continued skilled PT interventions to address impairments and progress towards PLOF. Acute PT will follow to progress mobility and stair training in preparation for safe discharge home.     Follow Up Recommendations Follow surgeon's recommendation for DC plan and follow-up therapies;Home health PT    Equipment Recommendations  None recommended by PT    Recommendations for Other Services       Precautions / Restrictions Precautions Precautions: Fall Restrictions Weight Bearing Restrictions: No Other Position/Activity Restrictions: WBAT      Mobility  Bed Mobility Overal bed mobility: Needs Assistance Bed Mobility: Supine to Sit     Supine to sit: Min assist;HOB elevated     General bed mobility comments: cues for use of bed rail and assist for Lt LE mobility to EOB.    Transfers Overall transfer level: Needs assistance Equipment used: Rolling walker (2 wheeled) Transfers: Sit to/from Stand Sit to Stand: Min assist         General transfer comment: cues for technique with RW, assist for power up and to steady rising.  Ambulation/Gait Ambulation/Gait assistance: Min assist Gait  Distance (Feet): 30 Feet Assistive device: Rolling walker (2 wheeled) Gait Pattern/deviations: Step-to pattern;Decreased stride length;Trunk flexed;Decreased weight shift to left Gait velocity: decr   General Gait Details: cues for step pattern and proximity to RW, assist to manage position of RW and steady intermittently.  Stairs            Wheelchair Mobility    Modified Rankin (Stroke Patients Only)       Balance Overall balance assessment: Needs assistance Sitting-balance support: Feet supported Sitting balance-Leahy Scale: Good     Standing balance support: During functional activity;Bilateral upper extremity supported Standing balance-Leahy Scale: Poor                               Pertinent Vitals/Pain Pain Assessment: Faces Faces Pain Scale: Hurts a little bit Pain Location: Lt hip Pain Descriptors / Indicators: Aching;Discomfort;Grimacing Pain Intervention(s): Limited activity within patient's tolerance;Monitored during session;Repositioned;Ice applied    Home Living Family/patient expects to be discharged to:: Private residence Living Arrangements: Spouse/significant other Available Help at Discharge: Family Type of Home: House Home Access: Stairs to enter Entrance Stairs-Rails: Can reach both Entrance Stairs-Number of Steps: 2 Home Layout: One level Home Equipment: Walker - 2 wheels;Cane - single point;Bedside commode;Shower seat;Grab bars - tub/shower Additional Comments: pt lives with her 75 y.o. husband and her daughter is able to help while she recovers.    Prior Function Level of Independence: Independent with assistive device(s)         Comments: pt using quad cane for mobility prior to surgery  Hand Dominance   Dominant Hand: Right    Extremity/Trunk Assessment   Upper Extremity Assessment Upper Extremity Assessment: Overall WFL for tasks assessed    Lower Extremity Assessment Lower Extremity Assessment: Overall  WFL for tasks assessed    Cervical / Trunk Assessment Cervical / Trunk Assessment: Kyphotic  Communication   Communication: No difficulties  Cognition Arousal/Alertness: Awake/alert Behavior During Therapy: WFL for tasks assessed/performed Overall Cognitive Status: Within Functional Limits for tasks assessed                                        General Comments      Exercises Total Joint Exercises Ankle Circles/Pumps: AROM;Both;20 reps;Seated   Assessment/Plan    PT Assessment Patient needs continued PT services  PT Problem List Decreased strength;Decreased range of motion;Decreased activity tolerance;Decreased balance;Decreased mobility;Decreased knowledge of use of DME;Decreased knowledge of precautions       PT Treatment Interventions DME instruction;Stair training;Gait training;Functional mobility training;Therapeutic activities;Therapeutic exercise;Balance training;Patient/family education    PT Goals (Current goals can be found in the Care Plan section)  Acute Rehab PT Goals Patient Stated Goal: recover mobility and go home PT Goal Formulation: With patient Time For Goal Achievement: 05/06/20 Potential to Achieve Goals: Good    Frequency 7X/week   Barriers to discharge        Co-evaluation               AM-PAC PT "6 Clicks" Mobility  Outcome Measure Help needed turning from your back to your side while in a flat bed without using bedrails?: A Little Help needed moving from lying on your back to sitting on the side of a flat bed without using bedrails?: A Little Help needed moving to and from a bed to a chair (including a wheelchair)?: A Little Help needed standing up from a chair using your arms (e.g., wheelchair or bedside chair)?: A Little Help needed to walk in hospital room?: A Little Help needed climbing 3-5 steps with a railing? : A Lot 6 Click Score: 17    End of Session Equipment Utilized During Treatment: Gait  belt Activity Tolerance: Patient tolerated treatment well Patient left: with call bell/phone within reach;in chair;with chair alarm set Nurse Communication: Mobility status PT Visit Diagnosis: Other abnormalities of gait and mobility (R26.89);Muscle weakness (generalized) (M62.81);Difficulty in walking, not elsewhere classified (R26.2)    Time: 6759-1638 PT Time Calculation (min) (ACUTE ONLY): 15 min   Charges:   PT Evaluation $PT Eval Low Complexity: 1 Low          Wynn Maudlin, DPT Acute Rehabilitation Services Office [phone] Pager [phone]    Anitra Lauth 04/29/2020, 5:29 PM

## 2020-04-29 NOTE — Transfer of Care (Signed)
Immediate Anesthesia Transfer of Care Note  Patient: Erin Herman  Procedure(s) Performed: TOTAL HIP ARTHROPLASTY ANTERIOR APPROACH (Left Hip)  Patient Location: PACU  Anesthesia Type:General  Level of Consciousness: drowsy and responds to stimulation  Airway & Oxygen Therapy: Patient Spontanous Breathing and Patient connected to face mask oxygen  Post-op Assessment: Report given to RN, Post -op Vital signs reviewed and stable and Patient moving all extremities  Post vital signs: Reviewed and stable  Last Vitals:  Vitals Value Taken Time  BP 109/49 04/29/20 1039  Temp    Pulse 72 04/29/20 1041  Resp 8 04/29/20 1041  SpO2 100 % 04/29/20 1041  Vitals shown include unvalidated device data.  Last Pain:  Vitals:   04/29/20 0605  TempSrc: Oral         Complications: No complications documented.

## 2020-04-29 NOTE — Op Note (Signed)
NAME:  Aliyyah Kisling                ACCOUNT NO.: [id number]      MEDICAL RECORD NO.: [id number]      FACILITY:  [facility]      PHYSICIAN:  Mauri Pole  DATE OF BIRTH:  01-03-1928     DATE OF PROCEDURE:  04/29/2020                                 OPERATIVE REPORT         PREOPERATIVE DIAGNOSIS: Left  hip osteoarthritis.      POSTOPERATIVE DIAGNOSIS:  Left hip osteoarthritis.      PROCEDURE:  Left total hip replacement through an anterior approach   utilizing DePuy THR system, component size 52 nn pinnacle cup, a size 36+4 neutral   Altrex liner, a size 5 standard Actis stem with a 36+1.5 Articuleze metal head ball.      SURGEON:  Pietro Cassis. Alvan Dame, M.D.      ASSISTANT:  Danae Orleans, PA-C     ANESTHESIA:  General.      SPECIMENS:  None.      COMPLICATIONS:  None.      BLOOD LOSS:  150 cc     DRAINS:  None.      INDICATION OF THE PROCEDURE:  Melony Fullilove is a 87 y.o. female who had   presented to office for evaluation of left hip pain.  Radiographs revealed   progressive degenerative changes with bone-on-bone   articulation of the  hip joint, including subchondral cystic changes and osteophytes.  The patient had painful limited range of   motion significantly affecting their overall quality of life and function.  The patient was failing to    respond to conservative measures including medications and/or injections and activity modification and at this point was ready   to proceed with more definitive measures.  Consent was obtained for   benefit of pain relief.  Specific risks of infection, DVT, component   failure, dislocation, neurovascular injury, and need for revision surgery were reviewed in the office as well discussion of   the anterior versus posterior approach were reviewed.     PROCEDURE IN DETAIL:  The patient was brought to operative theater.   Once adequate anesthesia, preoperative antibiotics, 2 gm of Ancef, 1 gm of  Tranexamic Acid, and 10 mg of Decadron were administered, the patient was positioned supine on the [employer] table.  Once the patient was safely positioned with adequate padding of boney prominences we predraped out the hip, and used fluoroscopy to confirm orientation of the pelvis.      The left hip was then prepped and draped from proximal iliac crest to   mid thigh with a shower curtain technique.      Time-out was performed identifying the patient, planned procedure, and the appropriate extremity.     An incision was then made 2 cm lateral to the   anterior superior iliac spine extending over the orientation of the   tensor fascia lata muscle and sharp dissection was carried down to the   fascia of the muscle.      The fascia was then incised.  The muscle belly was identified and swept   laterally and retractor placed along the superior neck.  Following   cauterization of the circumflex vessels and removing some pericapsular  fat, a second cobra retractor was placed on the inferior neck.  A T-capsulotomy was made along the line of the   superior neck to the trochanteric fossa, then extended proximally and   distally.  Tag sutures were placed and the retractors were then placed   intracapsular.  We then identified the trochanteric fossa and   orientation of my neck cut and then made a neck osteotomy with the femur on traction.  The femoral   head was removed without difficulty or complication.  Traction was let   off and retractors were placed posterior and anterior around the   acetabulum.      The labrum and foveal tissue were debrided.  I began reaming with a 45 mm   reamer and reamed up to 51 mm reamer with good bony bed preparation and a 52 mm  cup was chosen.  The final 52 mm Pinnacle cup was then impacted under fluoroscopy to confirm the depth of penetration and orientation with respect to   Abduction and forward flexion.  A screw was placed into the ilium followed by the hole  eliminator.  The final   36+4 neutral Altrex liner was impacted with good visualized rim fit.  The cup was positioned anatomically within the acetabular portion of the pelvis.      At this point, the femur was rolled to 100 degrees.  Further capsule was   released off the inferior aspect of the femoral neck.  I then   released the superior capsule proximally.  With the leg in a neutral position the hook was placed laterally   along the femur under the vastus lateralis origin and elevated manually and then held in position using the hook attachment on the bed.  The leg was then extended and adducted with the leg rolled to 100   degrees of external rotation.  Retractors were placed along the medial calcar and posteriorly over the greater trochanter.  Once the proximal femur was fully   exposed, I used a box osteotome to set orientation.  I then began   broaching with the starting chili pepper broach and passed this by hand and then broached up to 5.  With the 5 broach in place I chose a standard high offset neck and did several trial reductions.  The offset was appropriate, leg lengths   appeared to be equal best matched with the +1.5 head ball trial confirmed radiographically.   Given these findings, I went ahead and dislocated the hip, repositioned all   retractors and positioned the right hip in the extended and abducted position.  The final 5 standard Actis stem was   chosen and it was impacted down to the level of neck cut.  Based on this   and the trial reductions, a final 36+1.5 Articuleze metal head ball was chosen and   impacted onto a clean and dry trunnion, and the hip was reduced.  The   hip had been irrigated throughout the case again at this point.  I did   reapproximate the superior capsular leaflet to the anterior leaflet   using #1 Vicryl.  The fascia of the   tensor fascia lata muscle was then reapproximated using #1 Vicryl and #0 Stratafix sutures.  The   remaining wound was  closed with 2-0 Vicryl and running 4-0 Monocryl.   The hip was cleaned, dried, and dressed sterilely using Dermabond and   Aquacel dressing.  The patient was then brought   to recovery  room in stable condition tolerating the procedure well.    Lanney Gins, PA-C was present for the entirety of the case involved from   preoperative positioning, perioperative retractor management, general   facilitation of the case, as well as primary wound closure as assistant.            Madlyn Frankel Charlann Boxer, M.D.        04/29/2020 10:01 AM

## 2020-04-29 NOTE — Anesthesia Procedure Notes (Signed)
Procedure Name: LMA Insertion Date/Time: 04/29/2020 8:50 AM Performed by: Lorelee Market, CRNA Pre-anesthesia Checklist: Patient identified, Emergency Drugs available, Suction available and Patient being monitored Patient Re-evaluated:Patient Re-evaluated prior to induction Oxygen Delivery Method: Circle system utilized Preoxygenation: Pre-oxygenation with 100% oxygen Induction Type: IV induction LMA: LMA inserted LMA Size: 3.0 Number of attempts: 1 Placement Confirmation: positive ETCO2 and breath sounds checked- equal and bilateral Tube secured with: Tape Dental Injury: Teeth and Oropharynx as per pre-operative assessment

## 2020-04-29 NOTE — Anesthesia Postprocedure Evaluation (Signed)
Anesthesia Post Note  Patient: Erin Herman  Procedure(s) Performed: TOTAL HIP ARTHROPLASTY ANTERIOR APPROACH (Left Hip)     Patient location during evaluation: PACU Anesthesia Type: General Level of consciousness: awake Pain management: pain level controlled Vital Signs Assessment: post-procedure vital signs reviewed and stable Respiratory status: spontaneous breathing Cardiovascular status: stable Postop Assessment: no apparent nausea or vomiting Anesthetic complications: no   No complications documented.  Last Vitals:  Vitals:   04/29/20 1130 04/29/20 1145  BP: 133/66 137/77  Pulse: 81 82  Resp: (!) 9 12  Temp:    SpO2: 97% 94%    Last Pain:  Vitals:   04/29/20 1130  TempSrc:   PainSc: Asleep   Pain Goal:                   Caren Macadam

## 2020-04-29 NOTE — Progress Notes (Signed)
Orthopedic Tech Progress Note Patient Details:  Erin Herman 1927/11/29 [id number] Unable to use. Patient ID: Erin Herman, female   DOB: 1927/05/09, 87 y.o.   MRN: [id number]   Erin Herman 04/29/2020, 3:41 PM

## 2020-04-29 NOTE — Interval H&P Note (Signed)
History and Physical Interval Note:  04/29/2020 6:58 AM  [name]  has presented today for surgery, with the diagnosis of Left hip osteoarthritis.  The various methods of treatment have been discussed with the patient and family. After consideration of risks, benefits and other options for treatment, the patient has consented to  Procedure(s) with comments: TOTAL HIP ARTHROPLASTY ANTERIOR APPROACH (Left) - 70 mins as a surgical intervention.  The patient's history has been reviewed, patient examined, no change in status, stable for surgery.  I have reviewed the patient's chart and labs.  Questions were answered to the patient's satisfaction.     [name]

## 2020-04-30 ENCOUNTER — Encounter (HOSPITAL_COMMUNITY): Payer: Self-pay | Admitting: Orthopedic Surgery

## 2020-04-30 DIAGNOSIS — M1612 Unilateral primary osteoarthritis, left hip: Secondary | ICD-10-CM | POA: Diagnosis not present

## 2020-04-30 LAB — BASIC METABOLIC PANEL
Anion gap: 9 (ref 5–15)
BUN: 23 mg/dL (ref 8–23)
CO2: 25 mmol/L (ref 22–32)
Calcium: 8.1 mg/dL — ABNORMAL LOW (ref 8.9–10.3)
Chloride: 98 mmol/L (ref 98–111)
Creatinine, Ser: 1.03 mg/dL — ABNORMAL HIGH (ref 0.44–1.00)
GFR, Estimated: 52 mL/min — ABNORMAL LOW (ref >60)
Glucose, Bld: 136 mg/dL — ABNORMAL HIGH (ref 70–99)
Potassium: 3.9 mmol/L (ref 3.5–5.1)
Sodium: 132 mmol/L — ABNORMAL LOW (ref 135–145)

## 2020-04-30 LAB — CBC
HCT: 27 % — ABNORMAL LOW (ref 36.0–46.0)
Hemoglobin: 8.9 g/dL — ABNORMAL LOW (ref 12.0–15.0)
MCH: 32.4 pg (ref 26.0–34.0)
MCHC: 33 g/dL (ref 30.0–36.0)
MCV: 98.2 fL (ref 80.0–100.0)
Platelets: 177 10*3/uL (ref 150–400)
RBC: 2.75 MIL/uL — ABNORMAL LOW (ref 3.87–5.11)
RDW: 12.2 % (ref 11.5–15.5)
WBC: 6.4 10*3/uL (ref 4.0–10.5)
nRBC: 0 % (ref 0.0–0.2)

## 2020-04-30 MED ORDER — METHOCARBAMOL 500 MG PO TABS: 500.0000 mg | ORAL_TABLET | Freq: Four times a day (QID) | ORAL | 0 refills | Status: DC | PRN

## 2020-04-30 MED ORDER — DOCUSATE SODIUM 100 MG PO CAPS: 100.0000 mg | ORAL_CAPSULE | Freq: Two times a day (BID) | ORAL | 0 refills | Status: DC

## 2020-04-30 MED ORDER — POLYETHYLENE GLYCOL 3350 17 G PO PACK: 17.0000 g | PACK | Freq: Two times a day (BID) | ORAL | 0 refills | Status: DC

## 2020-04-30 MED ORDER — FERROUS SULFATE 325 (65 FE) MG PO TABS: 325.0000 mg | ORAL_TABLET | Freq: Three times a day (TID) | ORAL | 0 refills | Status: DC

## 2020-04-30 MED ORDER — HYDROCODONE-ACETAMINOPHEN 5-325 MG PO TABS: 1.0000 | ORAL_TABLET | Freq: Four times a day (QID) | ORAL | 0 refills | Status: DC | PRN

## 2020-04-30 NOTE — Progress Notes (Signed)
Physical Therapy Treatment Patient Details Name: Erin Herman MRN: [id number] DOB: 1927/09/30 Today's Date: 04/30/2020    History of Present Illness Patient is 87 y.o. female s/p Lt THA anterior approach on 04/29/20 with PMH significant for DM, NSTEMI, OA, GERD, HOH, CKD, CAD, anxiety, HTN, depression, anxiety, Bil TKA, Rt THA in 2018, CABG in 2006.    PT Comments    Pt progressing very well with PT. amb from bathroom, reviewed THA HEP and progression, reviewed handout. dtr present. Pt unable to void, RN aware.   Follow Up Recommendations  Follow surgeon's recommendation for DC plan and follow-up therapies;Home health PT     Equipment Recommendations  None recommended by PT    Recommendations for Other Services       Precautions / Restrictions Precautions Precautions: Fall Restrictions Other Position/Activity Restrictions: WBAT    Mobility  Bed Mobility Overal bed mobility: Needs Assistance Bed Mobility: Sit to Supine     Supine to sit: HOB elevated;Min assist Sit to supine: Min guard   General bed mobility comments: for safety  Transfers Overall transfer level: Needs assistance Equipment used: Rolling walker (2 wheeled) Transfers: Sit to/from Stand Sit to Stand: Min guard         General transfer comment: cues for hand placement, safety  Ambulation/Gait Ambulation/Gait assistance: Min assist Gait Distance (Feet): 10 Feet Assistive device: Rolling walker (2 wheeled) Gait Pattern/deviations: Decreased stride length;Trunk flexed;Decreased weight shift to left;Step-to pattern;Step-through pattern;Wide base of support Gait velocity: decr   General Gait Details: cues for step pattern and proximity to RW, assist to manage position of RW and steady intermittently.   Stairs             Wheelchair Mobility    Modified Rankin (Stroke Patients Only)       Balance Overall balance assessment: Needs assistance Sitting-balance support: Feet  supported Sitting balance-Leahy Scale: Good     Standing balance support: During functional activity;Bilateral upper extremity supported Standing balance-Leahy Scale: Fair Standing balance comment: able to stand and manage LB garment and wash hands with close supervision                            Cognition Arousal/Alertness: Awake/alert Behavior During Therapy: WFL for tasks assessed/performed Overall Cognitive Status: Within Functional Limits for tasks assessed                                        Exercises Total Joint Exercises Ankle Circles/Pumps: AROM;Both;20 reps;Seated Quad Sets: AROM;Both;10 reps Short Arc Quad: AROM;Left;10 reps Heel Slides: AROM;AAROM;Left;10 reps Hip ABduction/ADduction: AROM;AAROM;Left;10 reps    General Comments        Pertinent Vitals/Pain Pain Assessment: Faces Faces Pain Scale: Hurts a little bit Pain Location: Lt hip Pain Descriptors / Indicators: Discomfort;Tightness;Sore Pain Intervention(s): Limited activity within patient's tolerance;Monitored during session;Ice applied    Home Living                      Prior Function            PT Goals (current goals can now be found in the care plan section) Acute Rehab PT Goals Patient Stated Goal: recover mobility and go home PT Goal Formulation: With patient Time For Goal Achievement: 05/06/20 Potential to Achieve Goals: Good Progress towards PT goals: Progressing toward goals    Frequency  7X/week      PT Plan      Co-evaluation              AM-PAC PT "6 Clicks" Mobility   Outcome Measure  Help needed turning from your back to your side while in a flat bed without using bedrails?: A Little Help needed moving from lying on your back to sitting on the side of a flat bed without using bedrails?: A Little Help needed moving to and from a bed to a chair (including a wheelchair)?: A Little Help needed standing up from a chair using  your arms (e.g., wheelchair or bedside chair)?: A Little Help needed to walk in hospital room?: A Little Help needed climbing 3-5 steps with a railing? : A Little 6 Click Score: 18    End of Session Equipment Utilized During Treatment: Gait belt Activity Tolerance: Patient tolerated treatment well Patient left: with call bell/phone within reach;in bed;with bed alarm set Nurse Communication: Mobility status PT Visit Diagnosis: Other abnormalities of gait and mobility (R26.89);Muscle weakness (generalized) (M62.81);Difficulty in walking, not elsewhere classified (R26.2)     Time: 7711-6579 PT Time Calculation (min) (ACUTE ONLY): 33 min  Charges:  $Gait Training: 8-22 mins $Therapeutic Exercise: 8-22 mins                     Delice Bison, PT  Acute Rehab Dept ([facility]) [phone] Pager [phone]  04/30/2020    [facility] 04/30/2020, 4:24 PM

## 2020-04-30 NOTE — Plan of Care (Signed)
  Problem: Health Behavior/Discharge Planning: Goal: Ability to manage health-related needs will improve Outcome: Progressing   Problem: Clinical Measurements: Goal: Ability to maintain clinical measurements within normal limits will improve Outcome: Progressing Goal: Will remain free from infection Outcome: Progressing Goal: Diagnostic test results will improve Outcome: Progressing Goal: Respiratory complications will improve Outcome: Progressing Goal: Cardiovascular complication will be avoided Outcome: Progressing   Problem: Activity: Goal: Risk for activity intolerance will decrease Outcome: Progressing   Problem: Nutrition: Goal: Adequate nutrition will be maintained Outcome: Progressing   Problem: Coping: Goal: Level of anxiety will decrease Outcome: Progressing   Problem: Elimination: Goal: Will not experience complications related to bowel motility Outcome: Progressing Goal: Will not experience complications related to urinary retention Outcome: Progressing   Problem: Pain Managment: Goal: General experience of comfort will improve Outcome: Progressing   Problem: Safety: Goal: Ability to remain free from injury will improve Outcome: Progressing   Problem: Skin Integrity: Goal: Risk for impaired skin integrity will decrease Outcome: Progressing   Problem: Skin Integrity: Goal: Risk for impaired skin integrity will decrease Outcome: Progressing   Problem: Education: Goal: Knowledge of the prescribed therapeutic regimen will improve Outcome: Progressing Goal: Understanding of discharge needs will improve Outcome: Progressing   Problem: Activity: Goal: Ability to avoid complications of mobility impairment will improve Outcome: Progressing Goal: Ability to tolerate increased activity will improve Outcome: Progressing   Problem: Clinical Measurements: Goal: Postoperative complications will be avoided or minimized Outcome: Progressing   Problem: Pain  Management: Goal: Pain level will decrease with appropriate interventions Outcome: Progressing   Problem: Skin Integrity: Goal: Will show signs of wound healing Outcome: Progressing

## 2020-04-30 NOTE — Progress Notes (Signed)
     Subjective: 1 Day Post-Op Procedure(s) (LRB): TOTAL HIP ARTHROPLASTY ANTERIOR APPROACH (Left)   Patient reports pain as mild, pain controlled. No reported events throughout the night.  Dr. Charlann Boxer discussed the procedure, findings and expectations moving forward.  Ready to be discharged home, if they do well with PT.  Follow up in the clinic in 2 weeks.  Knows to call with any questions or concerns.       Objective:   VITALS:   Vitals:   04/30/20 0215 04/30/20 0601  BP: (!) 96/55 (!) 95/55  Pulse: 85 77  Resp: 16 16  Temp: 98.6 F (37 C) 98.2 F (36.8 C)  SpO2: 93% 94%    Dorsiflexion/Plantar flexion intact Incision: dressing C/D/I No cellulitis present Compartment soft  LABS Recent Labs    04/30/20 0438  HGB 8.9*  HCT 27.0*  WBC 6.4  PLT 177    Recent Labs    04/30/20 0438  NA 132*  K 3.9  BUN 23  CREATININE 1.03*  GLUCOSE 136*     Assessment/Plan: 1 Day Post-Op Procedure(s) (LRB): TOTAL HIP ARTHROPLASTY ANTERIOR APPROACH (Left) Foley cath d/c'ed Advance diet Up with therapy D/C IV fluids Discharge home if she does well with PT Follow up in 2 weeks at [facility] Follow up with OLIN,Anyelin Mogle D in 2 weeks.  Contact information:  [facility] [address] [place] [address] [phone]          Lanney Gins PA-C  [facility]  [place] [address], [place], [place] [address] Phone: [phone] [email or web address] Facebook  [employer]

## 2020-04-30 NOTE — Progress Notes (Signed)
PATIENT IS BEING DISCHARGED IN STABLE CONDITION. PATIENT IS URINATING EFFICIENTLY AND THE BLADDER SCAN IS WNL AFTER PATIENT VOIDS. PAIN IS CONTROLLED. DISCHARGE INSTRUCTIONS GIVEN TO PATIENT AND DAUGHTER.

## 2020-04-30 NOTE — Progress Notes (Signed)
Physical Therapy Treatment Patient Details Name: Erin Herman MRN: [id number] DOB: 10-30-1927 Today's Date: 04/30/2020    History of Present Illness Patient is 87 y.o. female s/p Lt THA anterior approach on 04/29/20 with PMH significant for DM, NSTEMI, OA, GERD, HOH, CKD, CAD, anxiety, HTN, depression, anxiety, Bil TKA, Rt THA in 2018, CABG in 2006.    PT Comments    Pt  Progressing well. Will see again later today and pt should be ready to d/c after second session  Follow Up Recommendations  Follow surgeon's recommendation for DC plan and follow-up therapies;Home health PT     Equipment Recommendations  None recommended by PT    Recommendations for Other Services       Precautions / Restrictions Precautions Precautions: Fall Restrictions Other Position/Activity Restrictions: WBAT    Mobility  Bed Mobility Overal bed mobility: Needs Assistance Bed Mobility: Supine to Sit     Supine to sit: HOB elevated;Min assist     General bed mobility comments: assist for LLE, incr time, good pt effort to assist self  Transfers Overall transfer level: Needs assistance Equipment used: Rolling walker (2 wheeled) Transfers: Sit to/from Stand Sit to Stand: Min guard;Min assist         General transfer comment: cues for technique with RW, steadying assist for power up and to rise and transition to RW.  Ambulation/Gait Ambulation/Gait assistance: Min assist Gait Distance (Feet): 100 Feet Assistive device: Rolling walker (2 wheeled) Gait Pattern/deviations: Decreased stride length;Trunk flexed;Decreased weight shift to left;Step-to pattern;Step-through pattern;Wide base of support Gait velocity: decr   General Gait Details: cues for step pattern and proximity to RW, assist to manage position of RW and steady intermittently.   Stairs Stairs: Yes Stairs assistance: Min guard Stair Management: Two rails;Forwards Number of Stairs: 2 General stair comments: cues for  sequence, pt tends toward reciprocal pattern, discussed this ok as long as pain is not incr.   Wheelchair Mobility    Modified Rankin (Stroke Patients Only)       Balance Overall balance assessment: Needs assistance Sitting-balance support: Feet supported Sitting balance-Leahy Scale: Good     Standing balance support: During functional activity;Bilateral upper extremity supported Standing balance-Leahy Scale: Fair Standing balance comment: able to stand and manage LB garment with close supervision                            Cognition Arousal/Alertness: Awake/alert Behavior During Therapy: WFL for tasks assessed/performed Overall Cognitive Status: Within Functional Limits for tasks assessed                                        Exercises Total Joint Exercises Ankle Circles/Pumps: AROM;Both;20 reps;Seated    General Comments        Pertinent Vitals/Pain Pain Assessment: Faces Faces Pain Scale: Hurts a little bit Pain Location: Lt hip Pain Descriptors / Indicators: Discomfort;Tightness;Sore Pain Intervention(s): Limited activity within patient's tolerance;Monitored during session;Repositioned;Ice applied    Home Living                      Prior Function            PT Goals (current goals can now be found in the care plan section) Acute Rehab PT Goals Patient Stated Goal: recover mobility and go home PT Goal Formulation: With patient Time For Goal Achievement:  05/06/20 Potential to Achieve Goals: Good    Frequency    7X/week      PT Plan      Co-evaluation              AM-PAC PT "6 Clicks" Mobility   Outcome Measure  Help needed turning from your back to your side while in a flat bed without using bedrails?: A Little Help needed moving from lying on your back to sitting on the side of a flat bed without using bedrails?: A Little Help needed moving to and from a bed to a chair (including a wheelchair)?: A  Little Help needed standing up from a chair using your arms (e.g., wheelchair or bedside chair)?: A Little Help needed to walk in hospital room?: A Little Help needed climbing 3-5 steps with a railing? : A Little 6 Click Score: 18    End of Session Equipment Utilized During Treatment: Gait belt Activity Tolerance: Patient tolerated treatment well Patient left: with call bell/phone within reach;in chair;with chair alarm set Nurse Communication: Mobility status PT Visit Diagnosis: Other abnormalities of gait and mobility (R26.89);Muscle weakness (generalized) (M62.81);Difficulty in walking, not elsewhere classified (R26.2)     Time: 1010-1038 PT Time Calculation (min) (ACUTE ONLY): 28 min  Charges:  $Gait Training: 23-37 mins                     Delice Bison, PT  Acute Rehab Dept ([facility]) [phone] Pager [phone]  04/30/2020    [facility] 04/30/2020, 10:54 AM

## 2020-05-01 ENCOUNTER — Other Ambulatory Visit: Payer: Self-pay | Admitting: Cardiology

## 2020-05-02 ENCOUNTER — Telehealth: Payer: Self-pay | Admitting: Cardiology

## 2020-05-02 MED ORDER — LOSARTAN POTASSIUM 25 MG PO TABS: 12.5000 mg | ORAL_TABLET | Freq: Every day | ORAL | 3 refills | Status: DC

## 2020-05-02 NOTE — Telephone Encounter (Signed)
Per daughter, losartan is on on mfg back order at [facility] and [address]. Advised that rx's will be sent to [facility] and [facility].  Per daughter, patient's lasix was stopped due to recent surgery and since that time, developed swelling while still inpatient. Patient was restarted on lasix this Wednesday. Denies SOB, dizziness or chest pain. Offered first available appointment but daughter declined. Advised to weigh daily, same time, same scale and same amount of cloths and contact our office next week with update. Verbalized understanding.

## 2020-05-02 NOTE — Discharge Summary (Signed)
Patient ID: Erin Herman MRN: [id number] DOB/AGE: 05/03/1927 87 y.o.  Admit date: 04/29/2020 Discharge date: 04/30/2020  Admission Diagnoses:  Active Problems:   Status post left hip replacement   Discharge Diagnoses:  Same  Past Medical History:  Diagnosis Date  . Anemia    iron  . Anxiety   . Aortic stenosis    Bioprosthetic AVR 2006  . CAD (coronary artery disease), native coronary artery    Multivessel status post CABG 2006, occluded SVG to circumflex, DES to circumflex July 2020  . Cardiomyopathy ([place])   . Chronic kidney disease   . Depression   . Dysphagia   . Essential hypertension   . Family history of adverse reaction to anesthesia    N&V  . GERD (gastroesophageal reflux disease)   . Headache   . History of hiatal hernia   . HOH (hard of hearing)   . LBBB (left bundle branch block)   . Mixed hyperlipidemia   . NSTEMI (non-ST elevated myocardial infarction) [facility])    July 2020  . Osteoarthritis   . Postoperative nausea and vomiting    Morphine  . Type 2 diabetes mellitus ([place])   . UTI (lower urinary tract infection)     Surgeries: Procedure(s): LEFT TOTAL HIP ARTHROPLASTY ANTERIOR APPROACH on 04/29/2020   Consultants: N/A  Discharged Condition: Improved  Hospital Course: Erin Herman is an 87 y.o. female who was admitted 04/29/2020 for operative treatment of left hip osteoarthritis . Patient has severe unremitting pain that affects sleep, daily activities, and work/hobbies. After pre-op clearance the patient was taken to the operating room on 04/29/2020 and underwent  Procedure(s): LEFT TOTAL HIP ARTHROPLASTY ANTERIOR APPROACH.    Patient was given perioperative antibiotics:  Anti-infectives (From admission, onward)   Start     Dose/Rate Route Frequency Ordered Stop   04/30/20 1000  trimethoprim (TRIMPEX) tablet 100 mg  Status:  Discontinued        100 mg Oral Daily 04/29/20 1301 04/30/20 2229   04/29/20 1600  ceFAZolin (ANCEF) IVPB 1 g/50 mL  premix        1 g 100 mL/hr over 30 Minutes Intravenous Every 6 hours 04/29/20 1301 04/29/20 2205   04/29/20 0600  ceFAZolin (ANCEF) IVPB 2g/100 mL premix        2 g 200 mL/hr over 30 Minutes Intravenous On call to O.R. 04/29/20 2426 04/29/20 0856       Patient was given sequential compression devices, early ambulation, and chemoprophylaxis to prevent DVT.  Patient benefited maximally from hospital stay and there were no complications.    Recent vital signs: No data found.   Recent laboratory studies:  Recent Labs    04/30/20 0438  WBC 6.4  HGB 8.9*  HCT 27.0*  PLT 177  NA 132*  K 3.9  CL 98  CO2 25  BUN 23  CREATININE 1.03*  GLUCOSE 136*  CALCIUM 8.1*     Discharge Medications:   Allergies as of 04/30/2020      Reactions   Lipitor [atorvastatin]    MUSCLE ACHES/FATIGUE   Morphine Nausea And Vomiting   Penicillins Swelling, Other (See Comments)   Tolerated Cephalosporin Date: 04/29/20. Severe arm swelling and redness Did it involve swelling of the face/tongue/throat, SOB, or low BP? No Did it involve sudden or severe rash/hives, skin peeling, or any reaction on the inside of your mouth or nose? No Did you need to seek medical attention at a hospital or doctor's office? No When did it last  happen?30 years If all above answers are "NO", may proceed with cephalosporin use.   Reclast [zoledronic Acid]    "nausea and vomiting. Could not get out of bed.      Medication List    STOP taking these medications   diclofenac sodium 1 % Gel Commonly known as: VOLTAREN     TAKE these medications   APPLE CIDER VINEGAR PO Take 1 tablet by mouth every morning.   B-12 2500 MCG Tabs Take 2,500 mcg by mouth daily.   CINNAMON PO Take 1 tablet by mouth 2 (two) times daily.   COQ10 PO Take 1 tablet by mouth daily.   docusate sodium 100 MG capsule Commonly known as: Colace Take 1 capsule (100 mg total) by mouth 2 (two) times daily.   famotidine 20 MG  tablet Commonly known as: PEPCID Take 1 tablet (20 mg total) by mouth 2 (two) times daily. What changed: when to take this   ferrous sulfate 325 (65 FE) MG tablet Commonly known as: FerrouSul Take 1 tablet (325 mg total) by mouth 3 (three) times daily with meals for 14 days.   furosemide 40 MG tablet Commonly known as: LASIX Take 1 tablet (40 mg total) by mouth 2 (two) times daily.   Hair/Skin/Nails/Biotin Tabs Take 1 tablet by mouth daily.   HYDROcodone-acetaminophen 5-325 MG tablet Commonly known as: Norco Take 1-2 tablets by mouth every 6 (six) hours as needed for moderate pain or severe pain.   levothyroxine 88 MCG tablet Commonly known as: SYNTHROID Take 88 mcg by mouth daily before breakfast.   metFORMIN 500 MG tablet Commonly known as: GLUCOPHAGE Take 1 tablet (500 mg total) by mouth 2 (two) times daily with a meal.   methocarbamol 500 MG tablet Commonly known as: Robaxin Take 1 tablet (500 mg total) by mouth every 6 (six) hours as needed for muscle spasms.   metolazone 5 MG tablet Commonly known as: ZAROXOLYN Take 1 tablet as needed for swelling What changed:   how to take this  when to take this  reasons to take this   metoprolol succinate 50 MG 24 hr tablet Commonly known as: TOPROL-XL Take 1 tablet (50 mg total) by mouth daily.   MULTIVITAMIN PO Take 1 tablet by mouth daily.   polyethylene glycol 17 g packet Commonly known as: MIRALAX / GLYCOLAX Take 17 g by mouth 2 (two) times daily.   potassium chloride SA 20 MEQ tablet Commonly known as: KLOR-CON Take 1 tablet (20 mEq total) by mouth daily after supper.   rosuvastatin 5 MG tablet Commonly known as: CRESTOR Take 5 mg by mouth daily.   sertraline 100 MG tablet Commonly known as: ZOLOFT Take 100 mg by mouth at bedtime.   spironolactone 25 MG tablet Commonly known as: ALDACTONE Take 0.5 tablets (12.5 mg total) by mouth daily.   SYSTANE OP Place 1 drop into both eyes 4 (four) times daily  as needed (for burning eyes).   ticagrelor 90 MG Tabs tablet Commonly known as: Brilinta Take 1 tablet (90 mg total) by mouth 2 (two) times daily.   trimethoprim 100 MG tablet Commonly known as: TRIMPEX Take 100 mg by mouth daily.   vitamin C 500 MG tablet Commonly known as: ASCORBIC ACID Take 500 mg by mouth daily.   Vitamin D3 125 MCG (5000 UT) Caps Take 5,000 Units by mouth daily.   Vitamin E 180 MG (400 UNIT) Caps Take 400 Units by mouth daily.  Discharge Care Instructions  (From admission, onward)         Start     Ordered   04/30/20 0000  Change dressing       Comments: Maintain surgical dressing until follow up in the clinic. If the edges start to pull up, may reinforce with tape. If the dressing is no longer working, may remove and cover with gauze and tape, but must keep the area dry and clean.  Call with any questions or concerns.   04/30/20 0850          Diagnostic Studies: DG Pelvis Portable  Result Date: 04/29/2020 CLINICAL DATA:  LEFT hip arthroplasty EXAM: PORTABLE PELVIS 1-2 VIEWS COMPARISON:  None. FINDINGS: Interval total LEFT hip arthroplasty. No fracture or dislocation. Femoral head component is articulated. IMPRESSION: LEFT hip total arthroplasty as above. Electronically Signed   By: Genevive Bi M.D.   On: 04/29/2020 11:43   DG C-Arm 1-60 Min-No Report  Result Date: 04/29/2020 Fluoroscopy was utilized by the requesting physician.  No radiographic interpretation.   DG HIP OPERATIVE UNILAT W OR W/O PELVIS LEFT  Result Date: 04/29/2020 CLINICAL DATA:  Left anterior hip replacement EXAM: OPERATIVE left HIP (WITH PELVIS IF PERFORMED) 4 VIEWS TECHNIQUE: Fluoroscopic spot image(s) were submitted for interpretation post-operatively. COMPARISON:  03/01/2017 FINDINGS: Left hip replacement in satisfactory position alignment. No fracture or complication. Pre-existing right hip replacement without complication. Femoral head prosthesis not in place  on the final image. IMPRESSION: Satisfactory left hip replacement. Electronically Signed   By: Marlan Palau M.D.   On: 04/29/2020 10:57    Disposition: HOME  Discharge Instructions    Call MD / Call 911   Complete by: As directed    If you experience chest pain or shortness of breath, CALL 911 and be transported to the hospital emergency room.  If you develope a fever above 101 F, pus (white drainage) or increased drainage or redness at the wound, or calf pain, call your surgeon's office.   Change dressing   Complete by: As directed    Maintain surgical dressing until follow up in the clinic. If the edges start to pull up, may reinforce with tape. If the dressing is no longer working, may remove and cover with gauze and tape, but must keep the area dry and clean.  Call with any questions or concerns.   Constipation Prevention   Complete by: As directed    Drink plenty of fluids.  Prune juice may be helpful.  You may use a stool softener, such as Colace (over the counter) 100 mg twice a day.  Use MiraLax (over the counter) for constipation as needed.   Diet - low sodium heart healthy   Complete by: As directed    Discharge instructions   Complete by: As directed    Maintain surgical dressing until follow up in the clinic. If the edges start to pull up, may reinforce with tape. If the dressing is no longer working, may remove and cover with gauze and tape, but must keep the area dry and clean.  Follow up in 2 weeks at [facility]. Call with any questions or concerns.   Increase activity slowly as tolerated   Complete by: As directed    Weight bearing as tolerated with assist device (walker, cane, etc) as directed, use it as long as suggested by your surgeon or therapist, typically at least 4-6 weeks.   TED hose   Complete by: As directed    Use stockings (  TED hose) for 2 weeks on both leg(s).  You may remove them at night for sleeping.       Follow-up Information    Durene Romans, MD.  Schedule an appointment as soon as possible for a visit in 2 weeks.   Specialty: Orthopedic Surgery Contact information: [address] [place] [address] [phone]                Signed: Genelle Gather [facility] 05/02/2020, 1:01 PM

## 2020-05-02 NOTE — Telephone Encounter (Signed)
Erin Herman daughter called stating that [facility] and the mail order Losartan 25          Are out of medication. Daughter states that patient was discharged from [facility] on 04/30/2020. They stopped her fluid pill. She has gained 10 lbs.

## 2020-05-02 NOTE — Telephone Encounter (Signed)
Cherly daughter called stating that [facility] and the mail order Losartan 25

## 2020-06-18 ENCOUNTER — Telehealth: Payer: Self-pay | Admitting: *Deleted

## 2020-06-18 NOTE — Telephone Encounter (Signed)
-----   Message from Jonelle Sidle, MD sent at 06/17/2020  9:13 AM EST ----- Results reviewed.  Potassium 3.9, creatinine relatively stable at 1.67.  We already cut Lasix dose back.  Continue with current follow-up plan.

## 2020-06-24 ENCOUNTER — Ambulatory Visit: Payer: Medicare Other | Admitting: Cardiology

## 2020-06-26 ENCOUNTER — Encounter: Payer: Self-pay | Admitting: Cardiology

## 2020-06-26 ENCOUNTER — Ambulatory Visit: Payer: Medicare Other | Admitting: Cardiology

## 2020-06-26 VITALS — BP 98/42 | HR 75 | Ht 64.0 in | Wt 134.0 lb

## 2020-06-26 DIAGNOSIS — I255 Ischemic cardiomyopathy: Secondary | ICD-10-CM | POA: Diagnosis not present

## 2020-06-26 DIAGNOSIS — Z953 Presence of xenogenic heart valve: Secondary | ICD-10-CM

## 2020-06-26 DIAGNOSIS — I25119 Atherosclerotic heart disease of native coronary artery with unspecified angina pectoris: Secondary | ICD-10-CM | POA: Diagnosis not present

## 2020-06-26 MED ORDER — ASPIRIN EC 81 MG PO TBEC: 81.0000 mg | DELAYED_RELEASE_TABLET | Freq: Every day | ORAL | 3 refills | Status: AC

## 2020-06-26 NOTE — Progress Notes (Signed)
Cardiology Office Note  Date: 06/26/2020   ID: Erin Herman, DOB May 06, 1927, MRN [id number]  PCP:  Kirstie Peri, MD  Cardiologist:  Nona Dell, MD Electrophysiologist:  None   Chief Complaint  Patient presents with  . Cardiac follow-up    History of Present Illness: Erin Herman is a 87 y.o. female last seen in August 2021.  She is here today with her husband and daughter for a follow-up visit.  From a cardiac perspective she does not report any active angina at this time.  She underwent left total hip arthroplasty in January of this year.  States that she has recovered well, ambulating with a cane.  I reviewed her medications which are outlined below.  We discussed stopping Brilinta at this point and transitioning to aspirin 81 mg daily.  She does not report any obvious bleeding or changes in stools, but has had iron deficiency anemia documented.  Her last echocardiogram was in February 2021 at which point LVEF was 35 to 40% with mildly reduced RV contraction.  Aortic bioprosthesis was stable in position with no significant regurgitation.  Past Medical History:  Diagnosis Date  . Anemia    iron  . Anxiety   . Aortic stenosis    Bioprosthetic AVR 2006  . CAD (coronary artery disease), native coronary artery    Multivessel status post CABG 2006, occluded SVG to circumflex, DES to circumflex July 2020  . Cardiomyopathy (HCC)   . Chronic kidney disease   . Depression   . Dysphagia   . Essential hypertension   . Family history of adverse reaction to anesthesia    N&V  . GERD (gastroesophageal reflux disease)   . Headache   . History of hiatal hernia   . HOH (hard of hearing)   . LBBB (left bundle branch block)   . Mixed hyperlipidemia   . NSTEMI (non-ST elevated myocardial infarction) [facility])    July 2020  . Osteoarthritis   . Postoperative nausea and vomiting    Morphine  . Type 2 diabetes mellitus (HCC)   . UTI (lower urinary tract infection)      Past Surgical History:  Procedure Laterality Date  . ABDOMINAL HYSTERECTOMY     partial  . AORTIC VALVE REPLACEMENT  2006   Dr. Cornelius Moras - 23 mm [place] stentless cadaver valve  . CARDIAC CATHETERIZATION  2020   with stents  . CHOLECYSTECTOMY    . CORONARY ARTERY BYPASS GRAFT  2006    Dr. Cornelius Moras - LIMA to LAD, SVG to circumflex  . CORONARY STENT INTERVENTION N/A 11/06/2018   Procedure: CORONARY STENT INTERVENTION;  Surgeon: Yvonne Kendall, MD;  Location: [facility] INVASIVE CV [facility];  Service: Cardiovascular;  Laterality: N/A;  . CORONARY/GRAFT ANGIOGRAPHY N/A 11/06/2018   Procedure: CORONARY/GRAFT ANGIOGRAPHY;  Surgeon: Yvonne Kendall, MD;  Location: [facility] INVASIVE CV LAB;  Service: Cardiovascular;  Laterality: N/A;  . THYROID LOBECTOMY Right   . TOTAL HIP ARTHROPLASTY Right 03/01/2017   Procedure: RIGHT TOTAL HIP ARTHROPLASTY ANTERIOR APPROACH;  Surgeon: Durene Romans, MD;  Location: [facility] ORS;  Service: Orthopedics;  Laterality: Right;  70 mins  . TOTAL HIP ARTHROPLASTY Left 04/29/2020   Procedure: TOTAL HIP ARTHROPLASTY ANTERIOR APPROACH;  Surgeon: Durene Romans, MD;  Location: [facility] ORS;  Service: Orthopedics;  Laterality: Left;  70 mins  . TOTAL KNEE ARTHROPLASTY Right 2006  . TOTAL KNEE ARTHROPLASTY Left 05/20/2014   DR Thurston Hole  . TOTAL KNEE ARTHROPLASTY Left 05/20/2014   Procedure: LEFT TOTAL KNEE ARTHROPLASTY;  Surgeon:  Nilda Simmer, MD;  Location: [facility] OR;  Service: Orthopedics;  Laterality: Left;    Current Outpatient Medications  Medication Sig Dispense Refill  . APPLE CIDER VINEGAR PO Take 1 tablet by mouth every morning.    Marland Kitchen aspirin EC 81 MG tablet Take 1 tablet (81 mg total) by mouth daily. Swallow whole. 90 tablet 3  . Cholecalciferol (VITAMIN D3) 125 MCG (5000 UT) CAPS Take 5,000 Units by mouth daily.    Marland Kitchen CINNAMON PO Take 1 tablet by mouth 2 (two) times daily.    . Coenzyme Q10 (COQ10 PO) Take 1 tablet by mouth daily.    . Cyanocobalamin (B-12) 2500 MCG TABS Take 2,500 mcg by mouth  daily.    Marland Kitchen docusate sodium (COLACE) 100 MG capsule Take 1 capsule (100 mg total) by mouth 2 (two) times daily. 28 capsule 0  . famotidine (PEPCID) 20 MG tablet Take 1 tablet (20 mg total) by mouth 2 (two) times daily. (Patient taking differently: Take 20 mg by mouth daily.) 60 tablet 1  . furosemide (LASIX) 40 MG tablet Take 1 tablet (40 mg total) by mouth 2 (two) times daily. 60 tablet 6  . levothyroxine (SYNTHROID) 88 MCG tablet Take 88 mcg by mouth daily before breakfast.    . losartan (COZAAR) 25 MG tablet Take 0.5 tablets (12.5 mg total) by mouth daily. 15 tablet 3  . metFORMIN (GLUCOPHAGE) 500 MG tablet Take 1 tablet (500 mg total) by mouth 2 (two) times daily with a meal.    . metolazone (ZAROXOLYN) 5 MG tablet Take 1 tablet as needed for swelling (Patient taking differently: Take by mouth daily as needed (swelling). Take 1 tablet as needed for swelling) 30 tablet 0  . metoprolol succinate (TOPROL-XL) 50 MG 24 hr tablet Take 1 tablet (50 mg total) by mouth daily. 90 tablet 3  . Multiple Vitamins-Minerals (HAIR/SKIN/NAILS/BIOTIN) TABS Take 1 tablet by mouth daily.    . Multiple Vitamins-Minerals (MULTIVITAMIN PO) Take 1 tablet by mouth daily.    Bertram Gala Glycol-Propyl Glycol (SYSTANE OP) Place 1 drop into both eyes 4 (four) times daily as needed (for burning eyes).     . polyethylene glycol (MIRALAX / GLYCOLAX) 17 g packet Take 17 g by mouth 2 (two) times daily. 28 packet 0  . potassium chloride SA (K-DUR,KLOR-CON) 20 MEQ tablet Take 1 tablet (20 mEq total) by mouth daily after supper. 90 tablet 0  . rosuvastatin (CRESTOR) 5 MG tablet Take 5 mg by mouth daily.    . sertraline (ZOLOFT) 100 MG tablet Take 100 mg by mouth at bedtime.    Marland Kitchen spironolactone (ALDACTONE) 25 MG tablet Take 0.5 tablets (12.5 mg total) by mouth daily. 45 tablet 3  . trimethoprim (TRIMPEX) 100 MG tablet Take 100 mg by mouth daily.    Marland Kitchen UNABLE TO FIND Med Name: Asencion Islam WITH IRON    . vitamin C (ASCORBIC ACID) 500  MG tablet Take 500 mg by mouth daily.    . Vitamin E 180 MG (400 UNIT) CAPS Take 400 Units by mouth daily.     No current facility-administered medications for this visit.   Allergies:  Lipitor [atorvastatin], Morphine, Penicillins, and Reclast [zoledronic acid]   ROS: No palpitations or syncope.  Physical Exam: VS:  BP (!) 98/42   Pulse 75   Ht 5\' 4"  (1.626 m)   Wt 134 lb (60.8 kg)   LMP  (LMP Unknown)   SpO2 96%   BMI 23.00 kg/m , BMI Body mass index  is 23 kg/m.  Wt Readings from Last 3 Encounters:  06/26/20 134 lb (60.8 kg)  04/29/20 129 lb (58.5 kg)  04/23/20 129 lb (58.5 kg)    General: Elderly woman, appears comfortable at rest. HEENT: Conjunctiva and lids normal, wearing a mask. Neck: Supple, no elevated JVP or carotid bruits, no thyromegaly. Lungs: Clear to auscultation, nonlabored breathing at rest. Cardiac: Regular rate and rhythm, no S3, soft systolic murmur, no pericardial rub. Extremities: No pitting edema.  ECG:  An ECG dated 12/17/2019 was personally reviewed today and demonstrated:  Sinus rhythm with prolonged PR interval, left bundle branch block, PVC.  Recent Labwork: 04/30/2020: BUN 23; Creatinine, Ser 1.03; Hemoglobin 8.9; Platelets 177; Potassium 3.9; Sodium 132     Component Value Date/Time   CHOL 210 (H) 11/04/2018 2142   TRIG 117 11/04/2018 2142   HDL 45 11/04/2018 2142   CHOLHDL 4.7 11/04/2018 2142   VLDL 23 11/04/2018 2142   LDLCALC 142 (H) 11/04/2018 2142    Other Studies Reviewed Today:  Echocardiogram 06/07/2019: 1. Left ventricular ejection fraction, by estimation, is 35 to 40%. The  left ventricle has moderately decreased function. The left ventrical  demonstrates regional wall motion abnormalities (see scoring  diagram/findings for description). There is mildly  increased asymmetric left ventricular hypertrophy of the posterior  segment. Left ventricular diastolic parameters are consistent with Grade  II diastolic dysfunction  (pseudonormalization). Elevated left ventricular  end-diastolic pressure.  2. Right ventricular systolic function is mildly reduced. The right  ventricular size is moderately enlarged. There is mildly elevated  pulmonary artery systolic pressure.  3. Left atrial size was moderately dilated.  4. Right atrial size was mildly dilated.  5. The mitral valve is degenerative. Moderate mitral valve regurgitation.  6. Tricuspid valve regurgitation is mild to moderate.  7. The aortic valve has been repaired/replaced. Aortic valve  regurgitation is not visualized. No aortic stenosis is present. 17mm  [place] bioprosthetic valve valve is present in the aortic position. Echo  findings show normal structure and function of  the aortic prosthesis.  8. The inferior vena cava is normal in size with greater than 50%  respiratory variability, suggesting right atrial pressure of 3 mmHg.   Assessment and Plan:  1.  Multivessel CAD status post CABG with graft disease and subsequent DES to the proximal circumflex in July 2020.  She does not report any active angina.  Plan to stop Brilinta and transition to aspirin 81 mg daily.  Otherwise continue losartan, Toprol-XL, and Crestor.  She tends to run a low blood pressure at baseline.  2.  Bioprosthetic AVR in place.  Stable by echocardiogram last year.  3.  Ischemic cardiomyopathy, LVEF 35 to 40%.  She is on Toprol-XL, losartan, Aldactone, and Lasix with potassium supplement.  Recent weights have been stable.  Medication Adjustments/Labs and Tests Ordered: Current medicines are reviewed at length with the patient today.  Concerns regarding medicines are outlined above.   Tests Ordered: No orders of the defined types were placed in this encounter.   Medication Changes: Meds ordered this encounter  Medications  . aspirin EC 81 MG tablet    Sig: Take 1 tablet (81 mg total) by mouth daily. Swallow whole.    Dispense:  90 tablet    Refill:  3     06/26/2020 stop brilinta    Disposition:  Follow up 6 months in the [place] office.  Signed, Jonelle Sidle, MD, [facility] 06/26/2020 3:50 PM    [facility] [address], [place], [place] [address] Phone: [phone]; Fax: [phone]

## 2020-06-26 NOTE — Patient Instructions (Addendum)
Medication Instructions:   Your physician has recommended you make the following change in your medication:   Stop brilinta  Start aspirin 81 mg by mouth daily  Continue other medications the same  Labwork:  none  Testing/Procedures:  none  Follow-Up:  Your physician recommends that you schedule a follow-up appointment in: 6 months.  Any Other Special Instructions Will Be Listed Below (If Applicable).  If you need a refill on your cardiac medications before your next appointment, please call your pharmacy.

## 2020-06-27 NOTE — Telephone Encounter (Signed)
Informed during office visit on yesterday Copy sent to PCP

## 2020-07-17 ENCOUNTER — Telehealth: Payer: Self-pay | Admitting: Cardiology

## 2020-07-17 MED ORDER — ROSUVASTATIN CALCIUM 5 MG PO TABS: 5.0000 mg | ORAL_TABLET | Freq: Every day | ORAL | 1 refills | Status: DC

## 2020-07-17 NOTE — Telephone Encounter (Signed)
*  STAT* If patient is at the pharmacy, call can be transferred to refill team.   1. Which medications need to be refilled? (please list name of each medication and dose if known) rosuvastatin (CRESTOR) 5 MG tablet [[id number]]   2. Which pharmacy/location (including street and city if local pharmacy) is medication to be sent to? [facility]   3. Do they need a 30 day or 90 day supply?  90 day  Pt's daughter [name] would like to know if they have this medication in a 5mg  tablet- pt has the 10 mg and she's been having to cut them in half, would be easier if she had the 5-   please let daughter know if that's an option. [phone]

## 2020-07-17 NOTE — Telephone Encounter (Signed)
Medication sent to pharmacy  

## 2020-11-06 ENCOUNTER — Encounter (HOSPITAL_COMMUNITY): Payer: Self-pay | Admitting: Emergency Medicine

## 2020-11-06 ENCOUNTER — Emergency Department (HOSPITAL_COMMUNITY): Payer: Medicare Other

## 2020-11-06 ENCOUNTER — Observation Stay (HOSPITAL_COMMUNITY)
Admission: EM | Admit: 2020-11-06 | Discharge: 2020-11-08 | Disposition: A | Discharge status: Home / Self Care | Payer: Medicare Other | Attending: Internal Medicine | Admitting: Internal Medicine

## 2020-11-06 ENCOUNTER — Other Ambulatory Visit: Payer: Self-pay

## 2020-11-06 DIAGNOSIS — Z951 Presence of aortocoronary bypass graft: Secondary | ICD-10-CM | POA: Insufficient documentation

## 2020-11-06 DIAGNOSIS — E039 Hypothyroidism, unspecified: Secondary | ICD-10-CM | POA: Diagnosis not present

## 2020-11-06 DIAGNOSIS — E876 Hypokalemia: Secondary | ICD-10-CM | POA: Diagnosis not present

## 2020-11-06 DIAGNOSIS — Z7984 Long term (current) use of oral hypoglycemic drugs: Secondary | ICD-10-CM | POA: Insufficient documentation

## 2020-11-06 DIAGNOSIS — Z96653 Presence of artificial knee joint, bilateral: Secondary | ICD-10-CM | POA: Diagnosis not present

## 2020-11-06 DIAGNOSIS — Z7982 Long term (current) use of aspirin: Secondary | ICD-10-CM | POA: Diagnosis not present

## 2020-11-06 DIAGNOSIS — N189 Chronic kidney disease, unspecified: Secondary | ICD-10-CM | POA: Insufficient documentation

## 2020-11-06 DIAGNOSIS — I5032 Chronic diastolic (congestive) heart failure: Secondary | ICD-10-CM | POA: Diagnosis present

## 2020-11-06 DIAGNOSIS — E1122 Type 2 diabetes mellitus with diabetic chronic kidney disease: Secondary | ICD-10-CM | POA: Insufficient documentation

## 2020-11-06 DIAGNOSIS — I130 Hypertensive heart and chronic kidney disease with heart failure and stage 1 through stage 4 chronic kidney disease, or unspecified chronic kidney disease: Secondary | ICD-10-CM | POA: Diagnosis not present

## 2020-11-06 DIAGNOSIS — I5022 Chronic systolic (congestive) heart failure: Secondary | ICD-10-CM

## 2020-11-06 DIAGNOSIS — R072 Precordial pain: Secondary | ICD-10-CM | POA: Diagnosis present

## 2020-11-06 DIAGNOSIS — Z96643 Presence of artificial hip joint, bilateral: Secondary | ICD-10-CM | POA: Insufficient documentation

## 2020-11-06 DIAGNOSIS — N183 Chronic kidney disease, stage 3 unspecified: Secondary | ICD-10-CM

## 2020-11-06 DIAGNOSIS — Z79899 Other long term (current) drug therapy: Secondary | ICD-10-CM | POA: Diagnosis not present

## 2020-11-06 DIAGNOSIS — I359 Nonrheumatic aortic valve disorder, unspecified: Secondary | ICD-10-CM | POA: Diagnosis not present

## 2020-11-06 DIAGNOSIS — Z20822 Contact with and (suspected) exposure to covid-19: Secondary | ICD-10-CM | POA: Insufficient documentation

## 2020-11-06 DIAGNOSIS — I25110 Atherosclerotic heart disease of native coronary artery with unstable angina pectoris: Principal | ICD-10-CM | POA: Insufficient documentation

## 2020-11-06 DIAGNOSIS — R079 Chest pain, unspecified: Secondary | ICD-10-CM | POA: Diagnosis present

## 2020-11-06 DIAGNOSIS — I2510 Atherosclerotic heart disease of native coronary artery without angina pectoris: Secondary | ICD-10-CM | POA: Diagnosis present

## 2020-11-06 DIAGNOSIS — I429 Cardiomyopathy, unspecified: Secondary | ICD-10-CM

## 2020-11-06 DIAGNOSIS — I10 Essential (primary) hypertension: Secondary | ICD-10-CM | POA: Diagnosis present

## 2020-11-06 DIAGNOSIS — E119 Type 2 diabetes mellitus without complications: Secondary | ICD-10-CM

## 2020-11-06 LAB — CBC WITH DIFFERENTIAL/PLATELET
Abs Immature Granulocytes: 0.01 10*3/uL (ref 0.00–0.07)
Basophils Absolute: 0 10*3/uL (ref 0.0–0.1)
Basophils Relative: 1 %
Eosinophils Absolute: 0.1 10*3/uL (ref 0.0–0.5)
Eosinophils Relative: 2 %
HCT: 31.5 % — ABNORMAL LOW (ref 36.0–46.0)
Hemoglobin: 10.5 g/dL — ABNORMAL LOW (ref 12.0–15.0)
Immature Granulocytes: 0 %
Lymphocytes Relative: 26 %
Lymphs Abs: 1.2 10*3/uL (ref 0.7–4.0)
MCH: 32.5 pg (ref 26.0–34.0)
MCHC: 33.3 g/dL (ref 30.0–36.0)
MCV: 97.5 fL (ref 80.0–100.0)
Monocytes Absolute: 0.4 10*3/uL (ref 0.1–1.0)
Monocytes Relative: 9 %
Neutro Abs: 2.8 10*3/uL (ref 1.7–7.7)
Neutrophils Relative %: 62 %
Platelets: 188 10*3/uL (ref 150–400)
RBC: 3.23 MIL/uL — ABNORMAL LOW (ref 3.87–5.11)
RDW: 12.3 % (ref 11.5–15.5)
WBC: 4.4 10*3/uL (ref 4.0–10.5)
nRBC: 0 % (ref 0.0–0.2)

## 2020-11-06 LAB — BASIC METABOLIC PANEL
Anion gap: 9 (ref 5–15)
BUN: 40 mg/dL — ABNORMAL HIGH (ref 8–23)
CO2: 30 mmol/L (ref 22–32)
Calcium: 8.9 mg/dL (ref 8.9–10.3)
Chloride: 96 mmol/L — ABNORMAL LOW (ref 98–111)
Creatinine, Ser: 1.02 mg/dL — ABNORMAL HIGH (ref 0.44–1.00)
GFR, Estimated: 52 mL/min — ABNORMAL LOW (ref >60)
Glucose, Bld: 134 mg/dL — ABNORMAL HIGH (ref 70–99)
Potassium: 3.3 mmol/L — ABNORMAL LOW (ref 3.5–5.1)
Sodium: 135 mmol/L (ref 135–145)

## 2020-11-06 LAB — HEPARIN LEVEL (UNFRACTIONATED): Heparin Unfractionated: <0.1 IU/mL — ABNORMAL LOW (ref 0.30–0.70)

## 2020-11-06 LAB — RESP PANEL BY RT-PCR (FLU A&B, COVID) ARPGX2
Influenza A by PCR: NEGATIVE
Influenza B by PCR: NEGATIVE
SARS Coronavirus 2 by RT PCR: NEGATIVE

## 2020-11-06 LAB — GLUCOSE, CAPILLARY
Glucose-Capillary: 114 mg/dL — ABNORMAL HIGH (ref 70–99)
Glucose-Capillary: 127 mg/dL — ABNORMAL HIGH (ref 70–99)

## 2020-11-06 LAB — TROPONIN I (HIGH SENSITIVITY)
Troponin I (High Sensitivity): 13 ng/L (ref <18)
Troponin I (High Sensitivity): 34 ng/L — ABNORMAL HIGH (ref <18)
Troponin I (High Sensitivity): 42 ng/L — ABNORMAL HIGH (ref <18)

## 2020-11-06 LAB — HEMOGLOBIN A1C
Hgb A1c MFr Bld: 7 % — ABNORMAL HIGH (ref 4.8–5.6)
Mean Plasma Glucose: 154.2 mg/dL

## 2020-11-06 LAB — MAGNESIUM: Magnesium: 1.8 mg/dL (ref 1.7–2.4)

## 2020-11-06 MED ORDER — ROSUVASTATIN CALCIUM 5 MG PO TABS
5.0000 mg | ORAL_TABLET | Freq: Every day | ORAL | Status: DC
  Filled 2020-11-06 (×2): qty 1

## 2020-11-06 MED ORDER — SODIUM CHLORIDE 0.9% FLUSH
3.0000 mL | Freq: Two times a day (BID) | INTRAVENOUS | Status: DC
  Administered 2020-11-07 – 2020-11-08 (×2): 3 mL via INTRAVENOUS

## 2020-11-06 MED ORDER — INSULIN ASPART 100 UNIT/ML IJ SOLN
0.0000 [IU] | Freq: Three times a day (TID) | INTRAMUSCULAR | Status: DC
  Administered 2020-11-07 – 2020-11-08 (×3): 1 [IU] via SUBCUTANEOUS

## 2020-11-06 MED ORDER — VITAMIN B-12 1000 MCG PO TABS
2500.0000 ug | ORAL_TABLET | Freq: Every day | ORAL | Status: DC
  Administered 2020-11-06 – 2020-11-08 (×3): 2500 ug via ORAL
  Filled 2020-11-06 (×3): qty 3

## 2020-11-06 MED ORDER — POLYETHYLENE GLYCOL 3350 17 G PO PACK: 17.0000 g | PACK | Freq: Every day | ORAL | Status: DC | PRN

## 2020-11-06 MED ORDER — ADULT MULTIVITAMIN W/MINERALS CH
1.0000 | ORAL_TABLET | Freq: Every day | ORAL | Status: DC
  Administered 2020-11-06 – 2020-11-08 (×3): 1 via ORAL
  Filled 2020-11-06 (×3): qty 1

## 2020-11-06 MED ORDER — MORPHINE SULFATE (PF) 2 MG/ML IV SOLN
1.0000 mg | Freq: Once | INTRAVENOUS | Status: DC
  Filled 2020-11-06: qty 1

## 2020-11-06 MED ORDER — POLYETHYL GLYCOL-PROPYL GLYCOL 0.4-0.3 % OP GEL
Freq: Every day | OPHTHALMIC | Status: DC
  Filled 2020-11-06: qty 10

## 2020-11-06 MED ORDER — HEPARIN (PORCINE) 25000 UT/250ML-% IV SOLN: 12.0000 [IU]/kg/h | INTRAVENOUS | Status: DC

## 2020-11-06 MED ORDER — ONDANSETRON HCL 4 MG/2ML IJ SOLN: 4.0000 mg | Freq: Four times a day (QID) | INTRAMUSCULAR | Status: DC | PRN

## 2020-11-06 MED ORDER — POLYETHYLENE GLYCOL 3350 17 G PO PACK
17.0000 g | PACK | Freq: Two times a day (BID) | ORAL | Status: DC
  Administered 2020-11-06 – 2020-11-08 (×2): 17 g via ORAL
  Filled 2020-11-06 (×3): qty 1

## 2020-11-06 MED ORDER — ASPIRIN 325 MG PO TABS
325.0000 mg | ORAL_TABLET | Freq: Every day | ORAL | Status: DC
  Administered 2020-11-06: 325 mg via ORAL
  Filled 2020-11-06: qty 1

## 2020-11-06 MED ORDER — VITAMIN D 25 MCG (1000 UNIT) PO TABS
5000.0000 [IU] | ORAL_TABLET | Freq: Every day | ORAL | Status: DC
  Administered 2020-11-06 – 2020-11-08 (×3): 5000 [IU] via ORAL
  Filled 2020-11-06 (×3): qty 5

## 2020-11-06 MED ORDER — VITAMIN E 180 MG (400 UNIT) PO CAPS
400.0000 [IU] | ORAL_CAPSULE | Freq: Every day | ORAL | Status: DC
  Administered 2020-11-06 – 2020-11-08 (×3): 400 [IU] via ORAL
  Filled 2020-11-06 (×4): qty 1

## 2020-11-06 MED ORDER — POLYVINYL ALCOHOL 1.4 % OP SOLN
1.0000 [drp] | OPHTHALMIC | Status: DC | PRN
  Filled 2020-11-06: qty 15

## 2020-11-06 MED ORDER — ONDANSETRON HCL 4 MG/2ML IJ SOLN
4.0000 mg | Freq: Once | INTRAMUSCULAR | Status: DC
  Filled 2020-11-06: qty 2

## 2020-11-06 MED ORDER — POTASSIUM CHLORIDE CRYS ER 20 MEQ PO TBCR
20.0000 meq | EXTENDED_RELEASE_TABLET | Freq: Every day | ORAL | Status: DC
  Administered 2020-11-06: 20 meq via ORAL
  Filled 2020-11-06: qty 1

## 2020-11-06 MED ORDER — SODIUM CHLORIDE 0.9 % IV SOLN: 250.0000 mL | INTRAVENOUS | Status: DC | PRN

## 2020-11-06 MED ORDER — HAIR/SKIN/NAILS/BIOTIN PO TABS: 1.0000 | ORAL_TABLET | Freq: Every day | ORAL | Status: DC

## 2020-11-06 MED ORDER — FAMOTIDINE 20 MG PO TABS
20.0000 mg | ORAL_TABLET | Freq: Every day | ORAL | Status: DC
  Administered 2020-11-06: 20 mg via ORAL
  Filled 2020-11-06 (×2): qty 1

## 2020-11-06 MED ORDER — TAMSULOSIN HCL 0.4 MG PO CAPS
0.4000 mg | ORAL_CAPSULE | Freq: Every day | ORAL | Status: DC
  Administered 2020-11-06 – 2020-11-07 (×2): 0.4 mg via ORAL
  Filled 2020-11-06 (×2): qty 1

## 2020-11-06 MED ORDER — METOPROLOL SUCCINATE ER 50 MG PO TB24
50.0000 mg | ORAL_TABLET | Freq: Every day | ORAL | Status: DC
  Administered 2020-11-06 – 2020-11-08 (×3): 50 mg via ORAL
  Filled 2020-11-06 (×3): qty 1

## 2020-11-06 MED ORDER — POTASSIUM CHLORIDE CRYS ER 20 MEQ PO TBCR
40.0000 meq | EXTENDED_RELEASE_TABLET | Freq: Once | ORAL | Status: AC
  Administered 2020-11-06: 40 meq via ORAL
  Filled 2020-11-06: qty 2

## 2020-11-06 MED ORDER — ONDANSETRON HCL 4 MG PO TABS: 4.0000 mg | ORAL_TABLET | Freq: Four times a day (QID) | ORAL | Status: DC | PRN

## 2020-11-06 MED ORDER — LEVOTHYROXINE SODIUM 88 MCG PO TABS
88.0000 ug | ORAL_TABLET | Freq: Every day | ORAL | Status: DC
  Administered 2020-11-07 – 2020-11-08 (×2): 88 ug via ORAL
  Filled 2020-11-06 (×2): qty 1

## 2020-11-06 MED ORDER — HEPARIN (PORCINE) 25000 UT/250ML-% IV SOLN
1000.0000 [IU]/h | INTRAVENOUS | Status: DC
  Administered 2020-11-06: 700 [IU]/h via INTRAVENOUS
  Filled 2020-11-06: qty 250

## 2020-11-06 MED ORDER — ROSUVASTATIN CALCIUM 10 MG PO TABS: 10.0000 mg | ORAL_TABLET | Freq: Every day | ORAL | Status: DC

## 2020-11-06 MED ORDER — TRAZODONE HCL 50 MG PO TABS: 50.0000 mg | ORAL_TABLET | Freq: Every evening | ORAL | Status: DC | PRN

## 2020-11-06 MED ORDER — FUROSEMIDE 40 MG PO TABS
40.0000 mg | ORAL_TABLET | Freq: Two times a day (BID) | ORAL | Status: DC
  Administered 2020-11-06: 40 mg via ORAL
  Filled 2020-11-06 (×2): qty 1

## 2020-11-06 MED ORDER — ISOSORBIDE MONONITRATE ER 30 MG PO TB24
30.0000 mg | ORAL_TABLET | Freq: Every day | ORAL | Status: DC
  Administered 2020-11-06: 30 mg via ORAL
  Filled 2020-11-06: qty 1

## 2020-11-06 MED ORDER — SODIUM CHLORIDE 0.9% FLUSH: 3.0000 mL | INTRAVENOUS | Status: DC | PRN

## 2020-11-06 MED ORDER — BISACODYL 10 MG RE SUPP: 10.0000 mg | Freq: Every day | RECTAL | Status: DC | PRN

## 2020-11-06 MED ORDER — ASCORBIC ACID 500 MG PO TABS
500.0000 mg | ORAL_TABLET | Freq: Every day | ORAL | Status: DC
  Administered 2020-11-06 – 2020-11-08 (×3): 500 mg via ORAL
  Filled 2020-11-06 (×3): qty 1

## 2020-11-06 MED ORDER — SERTRALINE HCL 50 MG PO TABS
50.0000 mg | ORAL_TABLET | Freq: Every day | ORAL | Status: DC
  Administered 2020-11-06 – 2020-11-07 (×2): 50 mg via ORAL
  Filled 2020-11-06 (×2): qty 1

## 2020-11-06 MED ORDER — SODIUM CHLORIDE 0.9% FLUSH
3.0000 mL | Freq: Two times a day (BID) | INTRAVENOUS | Status: DC
  Administered 2020-11-07: 3 mL via INTRAVENOUS

## 2020-11-06 MED ORDER — POTASSIUM CHLORIDE 20 MEQ PO PACK
40.0000 meq | PACK | Freq: Once | ORAL | Status: DC
  Filled 2020-11-06: qty 2

## 2020-11-06 MED ORDER — FENTANYL CITRATE (PF) 100 MCG/2ML IJ SOLN
25.0000 ug | INTRAMUSCULAR | Status: DC | PRN
  Filled 2020-11-06: qty 2

## 2020-11-06 MED ORDER — ACETAMINOPHEN 650 MG RE SUPP: 650.0000 mg | Freq: Four times a day (QID) | RECTAL | Status: DC | PRN

## 2020-11-06 MED ORDER — SPIRONOLACTONE 12.5 MG HALF TABLET
12.5000 mg | ORAL_TABLET | Freq: Every day | ORAL | Status: DC
  Administered 2020-11-06 – 2020-11-08 (×3): 12.5 mg via ORAL
  Filled 2020-11-06: qty 0.5
  Filled 2020-11-06: qty 1
  Filled 2020-11-06 (×2): qty 0.5
  Filled 2020-11-06 (×2): qty 1

## 2020-11-06 MED ORDER — SENNOSIDES-DOCUSATE SODIUM 8.6-50 MG PO TABS
2.0000 | ORAL_TABLET | Freq: Every day | ORAL | Status: DC
  Administered 2020-11-06: 2 via ORAL
  Filled 2020-11-06 (×2): qty 2

## 2020-11-06 MED ORDER — INSULIN ASPART 100 UNIT/ML IJ SOLN: 0.0000 [IU] | Freq: Every day | INTRAMUSCULAR | Status: DC

## 2020-11-06 MED ORDER — ACETAMINOPHEN 325 MG PO TABS: 650.0000 mg | ORAL_TABLET | Freq: Four times a day (QID) | ORAL | Status: DC | PRN

## 2020-11-06 NOTE — Progress Notes (Signed)
Patient still c/o chest pressure, refuse to take pain med.  MD notified. Imdur given per order. Will continue to monitor the patient.

## 2020-11-06 NOTE — ED Notes (Signed)
Patient complaining of "pressure in stomach area".  Patient states she cannot urinate. External cath. In place and explained to patient how the external cath worked.  Patient's daughter arrived and stated that patient was complaining "of pressure that had moved into chest area"  Repeat EKG and bladder scanner done.   No changes in EKG and bladder scanner results posted.

## 2020-11-06 NOTE — Progress Notes (Signed)
ANTICOAGULATION CONSULT NOTE - Follow Up Consult  Pharmacy Consult for Heparin Indication: chest pain/ACS  Allergies  Allergen Reactions   Lipitor [Atorvastatin]     MUSCLE ACHES/FATIGUE   Morphine Nausea And Vomiting   Penicillins Swelling and Other (See Comments)    Tolerated Cephalosporin Date: 04/29/20.  Severe arm swelling and redness Did it involve swelling of the face/tongue/throat, SOB, or low BP? No Did it involve sudden or severe rash/hives, skin peeling, or any reaction on the inside of your mouth or nose? No Did you need to seek medical attention at a hospital or doctor's office? No When did it last happen?      30 years If all above answers are "NO", may proceed with cephalosporin use.   Reclast [Zoledronic Acid]     "nausea and vomiting. Could not get out of bed.    Patient Measurements: Height: 5\' 4"  (162.6 cm) Weight: 58.9 kg (129 lb 14.4 oz) IBW/kg (Calculated) : 54.7 Heparin Dosing Weight: 59 kg  Vital Signs: Temp: 97.4 F (36.3 C) (07/14 2024) Temp Source: Oral (07/14 2024) BP: 115/57 (07/14 2024) Pulse Rate: 62 (07/14 2024)  Labs: Recent Labs    11/06/20 0851 11/06/20 1039 11/06/20 1803  HGB 10.5*  --   --   HCT 31.5*  --   --   PLT 188  --   --   CREATININE 1.02*  --   --   TROPONINIHS 13 34* 42*     Assessment: Pharmacy consulted to dose heparin in patient with chest pain/ACS.  Patient is not on anticoagulation prior to admission.  7/14 PM update:  Heparin level NOT crossing into EPIC Confirmed with lab that heparin level is undetectable (see copy/paste from lab report below)  [id number] <0.10 IU/mL HEPARIN LEVEL 11/06/2020 20:41 Kurdziel, Rumor   Goal of Therapy:  Heparin level 0.3-0.7 units/ml Monitor platelets by anticoagulation protocol: Yes   Plan:  Inc heparin to 850 units/hr Re-check heparin level in 8 hours  11/08/2020, PharmD, BCPS Clinical Pharmacist Phone: [phone]

## 2020-11-06 NOTE — H&P (Addendum)
Patient Demographics:    Erin Herman, is a 87 y.o. female  MRN: [id number]   DOB - 16-Apr-1928  Admit Date - 11/06/2020  Outpatient Primary MD for the patient is Kirstie Peri, MD   Assessment & Plan:    Principal Problem:   Chest pain Active Problems:   Essential hypertension, benign   CAD, NATIVE VESSEL   Secondary cardiomyopathy (HCC)   Aortic valve disorder   Chronic diastolic heart failure (HCC)   Diabetes (HCC)   1)Atypical Chest Pain/chest pressure/H/o Multivessel CAD status post CABG with graft disease and subsequent DES to the proximal circumflex in 7/ 2020. -Patient stopped Brilinta in March 2022, PTA was  on asp 81 mg monotherapy  -Cardiology consult appreciated recommends IV heparin and transfer to [facility] campus just in case patient ends up needing LHC -Okay to use aspirin 325 mg daily,  and Toprol-XL -- Imdur and Crestor added, hold losartan -Echo pending  2)Ischemic cardiomyopathy/chronic systolic dysfunction CHF--- last known EF 35 to 40% based on echo from 05/2019-----see medications as above #1 -Losartan on hold -Continue Toprol XL, Lasix and Aldactone -Daily weight and fluid input and output monitoring -Repeat echo as above #1  3)DM2-hold metformin due to possible need for contrast study Use Novolog/Humalog Sliding scale insulin with Accu-Cheks/Fingersticks as ordered  --??  If Candidate for Farxiga  4)Bioprosthetic AVR --appears stable on echo from 06/16/2019 -Repeat echo pending as above #1  5)Hypothyroidism--- continue levothyroxine  6) hypokalemia--- replace, also check mag  7) chronic anemia--- hemoglobin currently close to baseline, no evidence of ongoing bleeding continue to monitor and transfuse as clinically indicated  Disposition/Need for in-Hospital Stay-  patient unable to be discharged at this time due to chest pressure and concerns for ACS--currently requiring IV heparin,  may need LHC*  Dispo: The patient is from: Home              Anticipated d/c is to: Home              Anticipated d/c date is: 1 day              Patient currently is not medically stable to d/c. Barriers: Not Clinically Stable-    With History of - Reviewed by me  Past Medical History:  Diagnosis Date   Anemia    iron   Anxiety    Aortic stenosis    Bioprosthetic AVR 2006   CAD (coronary artery disease), native coronary artery    Multivessel status post CABG 2006, occluded SVG to circumflex, DES to circumflex July 2020   Cardiomyopathy [facility])    Chronic kidney disease    Depression    Dysphagia    Essential hypertension    Family history of adverse reaction to anesthesia    N&V   GERD (gastroesophageal reflux disease)    Headache    History of hiatal hernia    HOH (hard of hearing)    LBBB (  left bundle branch block)    Mixed hyperlipidemia    NSTEMI (non-ST elevated myocardial infarction) [facility])    July 2020   Osteoarthritis    Postoperative nausea and vomiting    Morphine   Type 2 diabetes mellitus (HCC)    UTI (lower urinary tract infection)       Past Surgical History:  Procedure Laterality Date   ABDOMINAL HYSTERECTOMY     partial   AORTIC VALVE REPLACEMENT  2006   Dr. Cornelius Moras - 23 mm [place] stentless cadaver valve   CARDIAC CATHETERIZATION  2020   with stents   CHOLECYSTECTOMY     CORONARY ARTERY BYPASS GRAFT  2006    Dr. Cornelius Moras - LIMA to LAD, SVG to circumflex   CORONARY STENT INTERVENTION N/A 11/06/2018   Procedure: CORONARY STENT INTERVENTION;  Surgeon: Yvonne Kendall, MD;  Location: [facility] INVASIVE CV LAB;  Service: Cardiovascular;  Laterality: N/A;   CORONARY/GRAFT ANGIOGRAPHY N/A 11/06/2018   Procedure: CORONARY/GRAFT ANGIOGRAPHY;  Surgeon: Yvonne Kendall, MD;  Location: [facility] INVASIVE CV LAB;  Service: Cardiovascular;  Laterality: N/A;    THYROID LOBECTOMY Right    TOTAL HIP ARTHROPLASTY Right 03/01/2017   Procedure: RIGHT TOTAL HIP ARTHROPLASTY ANTERIOR APPROACH;  Surgeon: Durene Romans, MD;  Location: [facility] ORS;  Service: Orthopedics;  Laterality: Right;  70 mins   TOTAL HIP ARTHROPLASTY Left 04/29/2020   Procedure: TOTAL HIP ARTHROPLASTY ANTERIOR APPROACH;  Surgeon: Durene Romans, MD;  Location: [facility] ORS;  Service: Orthopedics;  Laterality: Left;  70 mins   TOTAL KNEE ARTHROPLASTY Right 2006   TOTAL KNEE ARTHROPLASTY Left 05/20/2014   DR Thurston Hole   TOTAL KNEE ARTHROPLASTY Left 05/20/2014   Procedure: LEFT TOTAL KNEE ARTHROPLASTY;  Surgeon: Nilda Simmer, MD;  Location: [facility] OR;  Service: Orthopedics;  Laterality: Left;    Chief Complaint  Patient presents with   Chest Pain      HPI:    Erin Herman  is a 87 y.o. female  with hx of CAD (CABG in 2006, last cath 10/2018 - patent LIMA-LAD, occluded SVG-OM1, DES to pLCx), anxiety, bioprosthetic AVR 2006, chronic LBBB on ECG, anemia, CKD, hypertension, GERD and s/p left hip arthroplasty Jan 22 and  DM2--- presents with chest pressure/chest discomfort since 7 AM--denies nausea, vomiting, denies dizziness, palpitations or diaphoresis -Patient apparently developed brief/transient hypotension after EMS gave her sublingual nitro -States he also has some abdominal discomfort but mostly is worried about the chest pressure -Additional history obtained from patient's daughter at bedside No fever  Or chills  -Potassium is 3.3, creatinine is 1.0 hemoglobin is 10.5 with WBC of 4.4 -Chest x-ray without acute cardiopulmonary findings, troponin is 13, repeat troponin 34, -EKG LBB with no additional new findings -Per  cardiologist recommendation patient was started on IV heparin in the ED due to persistent chest pressure with plans to transfer to [facility]   Review of systems:    In addition to the HPI above,   A full Review of  Systems was done, all other systems reviewed are negative  except as noted above in HPI , .    Social History:  Reviewed by me    Social History   Tobacco Use   Smoking status: Never   Smokeless tobacco: Never  Substance Use Topics   Alcohol use: No    Alcohol/week: 0.0 standard drinks      Family History :  Reviewed by me    Family History  Problem Relation Age of Onset   Heart Problems  Mother    Diabetes Father    Cirrhosis Father    Diabetes Other      Home Medications:   Prior to Admission medications   Medication Sig Start Date End Date Taking? Authorizing Provider  APPLE CIDER VINEGAR PO Take 1 tablet by mouth every morning.   Yes [provider]  aspirin EC 81 MG tablet Take 1 tablet (81 mg total) by mouth daily. Swallow whole. 06/26/20  Yes Jonelle SidleMcDowell, Samuel G, MD  Cholecalciferol (VITAMIN D3) 125 MCG (5000 UT) CAPS Take 5,000 Units by mouth daily.   Yes [provider]  CINNAMON PO Take 1 tablet by mouth 2 (two) times daily.   Yes [provider]  Coenzyme Q10 (COQ10 PO) Take 1 tablet by mouth daily.   Yes [provider]  CRANBERRY PO Take 1 tablet by mouth daily.   Yes [provider]  Cyanocobalamin (B-12) 2500 MCG TABS Take 2,500 mcg by mouth daily.   Yes [provider]  famotidine (PEPCID) 20 MG tablet Take 1 tablet (20 mg total) by mouth 2 (two) times daily. Patient taking differently: Take 20 mg by mouth daily. 11/07/18  Yes Mikhail, Nita SellsMaryann, DO  furosemide (LASIX) 40 MG tablet Take 1 tablet (40 mg total) by mouth 2 (two) times daily. 12/17/19  Yes Jonelle SidleMcDowell, Samuel G, MD  levothyroxine (SYNTHROID) 88 MCG tablet Take 88 mcg by mouth daily before breakfast.   Yes [provider]  losartan (COZAAR) 25 MG tablet Take 0.5 tablets (12.5 mg total) by mouth daily. 05/02/20  Yes Jonelle SidleMcDowell, Samuel G, MD  metFORMIN (GLUCOPHAGE) 500 MG tablet Take 1 tablet (500 mg total) by mouth 2 (two) times daily with a meal. 11/10/18  Yes Mikhail, HullMaryann, DO  metolazone (ZAROXOLYN)  5 MG tablet Take 1 tablet as needed for swelling 11/22/17  Yes Jonelle SidleMcDowell, Samuel G, MD  metoprolol succinate (TOPROL-XL) 50 MG 24 hr tablet Take 1 tablet (50 mg total) by mouth daily. 07/14/18  Yes Jonelle SidleMcDowell, Samuel G, MD  Multiple Vitamins-Minerals (HAIR/SKIN/NAILS/BIOTIN) TABS Take 1 tablet by mouth daily.   Yes [provider]  Multiple Vitamins-Minerals (MULTIVITAMIN PO) Take 1 tablet by mouth daily.   Yes [provider]  Polyethyl Glycol-Propyl Glycol (SYSTANE OP) Place 1 drop into both eyes daily.   Yes [provider]  polyethylene glycol (MIRALAX / GLYCOLAX) 17 g packet Take 17 g by mouth 2 (two) times daily. 04/30/20  Yes Babish, Molli HazardMatthew, PA-C  potassium chloride SA (K-DUR,KLOR-CON) 20 MEQ tablet Take 1 tablet (20 mEq total) by mouth daily after supper. 01/26/17  Yes Jonelle SidleMcDowell, Samuel G, MD  rosuvastatin (CRESTOR) 5 MG tablet Take 1 tablet (5 mg total) by mouth daily. 07/17/20  Yes Jonelle SidleMcDowell, Samuel G, MD  sertraline (ZOLOFT) 100 MG tablet Take 50 mg by mouth at bedtime.   Yes [provider]  spironolactone (ALDACTONE) 25 MG tablet Take 0.5 tablets (12.5 mg total) by mouth daily. 07/06/16  Yes Laqueta LindenKoneswaran, Suresh A, MD  trimethoprim (TRIMPEX) 100 MG tablet Take 100 mg by mouth daily.   Yes [provider]  vitamin C (ASCORBIC ACID) 500 MG tablet Take 500 mg by mouth in the morning and at bedtime.   Yes [provider]  Vitamin E 180 MG (400 UNIT) CAPS Take 400 Units by mouth daily.   Yes [provider]  docusate sodium (COLACE) 100 MG capsule Take 1 capsule (100 mg total) by mouth 2 (two) times daily. Patient not taking: No sig reported 04/30/20  Lanney Gins, PA-C     Allergies:     Allergies  Allergen Reactions   Lipitor [Atorvastatin]     MUSCLE ACHES/FATIGUE   Morphine Nausea And Vomiting   Penicillins Swelling and Other (See Comments)    Tolerated Cephalosporin Date: 04/29/20.  Severe arm swelling and redness Did it  involve swelling of the face/tongue/throat, SOB, or low BP? No Did it involve sudden or severe rash/hives, skin peeling, or any reaction on the inside of your mouth or nose? No Did you need to seek medical attention at a hospital or doctor's office? No When did it last happen?      30 years If all above answers are "NO", may proceed with cephalosporin use.   Reclast [Zoledronic Acid]     "nausea and vomiting. Could not get out of bed.     Physical Exam:   Vitals  Blood pressure 134/66, pulse 73, temperature 97.8 F (36.6 C), temperature source Oral, resp. rate 14, height 5\' 4"  (1.626 m), weight 59 kg, SpO2 97 %.  Physical Examination: General appearance - alert,  and in no distress   Mental status - alert, oriented to person, place, and time,  Eyes - sclera anicteric Neck - supple, no JVD elevation , Chest - clear  to auscultation bilaterally, symmetrical air movement,  Heart - S1 and S2 normal, regular, prior sternotomy scar Abdomen - soft, nontender, nondistended, no masses or organomegaly Neurological - screening mental status exam normal, neck supple without rigidity, cranial nerves II through XII intact, DTR's normal and symmetric Extremities - no pedal edema noted, intact peripheral pulses  Skin - warm, dry     Data Review:    CBC Recent Labs  Lab 11/06/20 0851  WBC 4.4  HGB 10.5*  HCT 31.5*  PLT 188  MCV 97.5  MCH 32.5  MCHC 33.3  RDW 12.3  LYMPHSABS 1.2  MONOABS 0.4  EOSABS 0.1  BASOSABS 0.0   ------------------------------------------------------------------------------------------------------------------  Chemistries  Recent Labs  Lab 11/06/20 0851  NA 135  K 3.3*  CL 96*  CO2 30  GLUCOSE 134*  BUN 40*  CREATININE 1.02*  CALCIUM 8.9   ------------------------------------------------------------------------------------------------------------------ estimated creatinine clearance is 31 mL/min (A) (by C-G formula based on SCr of 1.02 mg/dL  (H)). ------------------------------------------------------------------------------------------------------------------ No results for input(s): TSH, T4TOTAL, T3FREE, THYROIDAB in the last 72 hours.  Invalid input(s): FREET3   Coagulation profile No results for input(s): INR, PROTIME in the last 168 hours. ------------------------------------------------------------------------------------------------------------------- No results for input(s): DDIMER in the last 72 hours. -------------------------------------------------------------------------------------------------------------------  Cardiac Enzymes No results for input(s): CKMB, TROPONINI, MYOGLOBIN in the last 168 hours.  Invalid input(s): CK ------------------------------------------------------------------------------------------------------------------    Component Value Date/Time   BNP 272.5 (H) 11/05/2018 0419     ---------------------------------------------------------------------------------------------------------------  Urinalysis    Component Value Date/Time   COLORURINE YELLOW 05/20/2014 0802   APPEARANCEUR CLEAR 05/20/2014 0802   LABSPEC 1.009 05/20/2014 0802   PHURINE 7.5 05/20/2014 0802   GLUCOSEU NEGATIVE 05/20/2014 0802   HGBUR NEGATIVE 05/20/2014 0802   BILIRUBINUR NEGATIVE 05/20/2014 0802   KETONESUR NEGATIVE 05/20/2014 0802   PROTEINUR NEGATIVE 05/20/2014 0802   UROBILINOGEN 0.2 05/20/2014 0802   NITRITE NEGATIVE 05/20/2014 0802   LEUKOCYTESUR NEGATIVE 05/20/2014 0802    ----------------------------------------------------------------------------------------------------------------   Imaging Results:    DG Chest Port 1 View  Result Date: 11/06/2020 CLINICAL DATA:  87 year old female with history of substernal chest pain. EXAM: PORTABLE CHEST 1 VIEW COMPARISON:  Chest x-ray 05/07/2014. FINDINGS: Lung volumes are normal. No consolidative  airspace disease. No pleural effusions. No pneumothorax.  No pulmonary nodule or mass noted. Pulmonary vasculature and the cardiomediastinal silhouette are within normal limits. Atherosclerosis in the thoracic aorta. Status post median sternotomy for CABG. IMPRESSION: 1. No radiographic evidence of acute cardiopulmonary disease. 2. Aortic atherosclerosis. Electronically Signed   By: Trudie Reed M.D.   On: 11/06/2020 08:59    Radiological Exams on Admission: DG Chest Port 1 View  Result Date: 11/06/2020 CLINICAL DATA:  87 year old female with history of substernal chest pain. EXAM: PORTABLE CHEST 1 VIEW COMPARISON:  Chest x-ray 05/07/2014. FINDINGS: Lung volumes are normal. No consolidative airspace disease. No pleural effusions. No pneumothorax. No pulmonary nodule or mass noted. Pulmonary vasculature and the cardiomediastinal silhouette are within normal limits. Atherosclerosis in the thoracic aorta. Status post median sternotomy for CABG. IMPRESSION: 1. No radiographic evidence of acute cardiopulmonary disease. 2. Aortic atherosclerosis. Electronically Signed   By: Trudie Reed M.D.   On: 11/06/2020 08:59    DVT Prophylaxis -SCD /iv heparin  AM Labs Ordered, also please review Full Orders  Family Communication: Admission, patients condition and plan of care including tests being ordered have been discussed with the patient and daughter at bedside who indicate understanding and agree with the plan   Code Status - Full Code  Likely DC to  home after resolution of chest discomfort  Condition   stable  Shon Hale M.D on 11/06/2020 at 4:40 PM Go to [email or web address] -  for contact info  [facility] - Office  [phone]

## 2020-11-06 NOTE — Progress Notes (Signed)
ANTICOAGULATION CONSULT NOTE - Follow Up Consult  Pharmacy Consult for heparin Indication: chest pain/ACS  Allergies  Allergen Reactions   Lipitor [Atorvastatin]     MUSCLE ACHES/FATIGUE   Morphine Nausea And Vomiting   Penicillins Swelling and Other (See Comments)    Tolerated Cephalosporin Date: 04/29/20.  Severe arm swelling and redness Did it involve swelling of the face/tongue/throat, SOB, or low BP? No Did it involve sudden or severe rash/hives, skin peeling, or any reaction on the inside of your mouth or nose? No Did you need to seek medical attention at a hospital or doctor's office? No When did it last happen?      30 years If all above answers are "NO", may proceed with cephalosporin use.   Reclast [Zoledronic Acid]     "nausea and vomiting. Could not get out of bed.    Patient Measurements: Height: 5\' 4"  (162.6 cm) Weight: 59 kg (130 lb) IBW/kg (Calculated) : 54.7 Heparin Dosing Weight: 59 kg  Vital Signs: Temp: 97.8 F (36.6 C) (07/14 0817) Temp Source: Oral (07/14 0817) BP: 137/81 (07/14 1030) Pulse Rate: 99 (07/14 1030)  Labs: Recent Labs    11/06/20 0851 11/06/20 1039  HGB 10.5*  --   HCT 31.5*  --   PLT 188  --   CREATININE 1.02*  --   TROPONINIHS 13 34*    Estimated Creatinine Clearance: 31 mL/min (A) (by C-G formula based on SCr of 1.02 mg/dL (H)).   Medications:  (Not in a hospital admission)   Assessment: Pharmacy consulted to dose heparin in patient with chest pain/ACS.  Patient is not on anticoagulation prior to admission.  Goal of Therapy:  Heparin level 0.3-0.7 units/ml Monitor platelets by anticoagulation protocol: Yes   Plan:  No bolus Start heparin infusion at 700 units/hr Check anti-Xa level in 8 hours and daily while on heparin Continue to monitor H&H and platelets  11/08/20, PharmD Clinical Pharmacist 11/06/2020 12:33 PM

## 2020-11-06 NOTE — ED Provider Notes (Addendum)
[facility] EMERGENCY DEPARTMENT Provider Note   CSN: [id number] Arrival date & time: 11/06/20  1443     History Chief Complaint  Patient presents with   Chest Pain    Erin Herman is a 87 y.o. female.  Patient with a known cardiac history.  Had coronary bypass surgery in the past also has a stent that was placed in 2020.  It appears from records that the bypass surgery was in 2006.  Patient with onset of sternal chest pain today at 0 700.  EMS called.  EMS gave her nitroglycerin and aspirin.  No change in the chest pain.  Not associated with shortness of breath not associated with nausea and vomiting.  Nonradiating.  The nitroglycerin did drop her pressures down to into 95 systolic.  Currently is back up to 136.  Patient is followed by Dr. Diona Browner from cardiology.  Past medical history otherwise significant for chronic kidney disease the cardiomyopathy hypertension and left bundle branch block.  In addition has a history of diabetes.  Patient mentally is very alert.  Also patient talks about valve replacement that occurred in 2006 when they did the CABG that was for aortic stenosis.  Patient brought in from home.      Past Medical History:  Diagnosis Date   Anemia    iron   Anxiety    Aortic stenosis    Bioprosthetic AVR 2006   CAD (coronary artery disease), native coronary artery    Multivessel status post CABG 2006, occluded SVG to circumflex, DES to circumflex July 2020   Cardiomyopathy [facility])    Chronic kidney disease    Depression    Dysphagia    Essential hypertension    Family history of adverse reaction to anesthesia    N&V   GERD (gastroesophageal reflux disease)    Headache    History of hiatal hernia    HOH (hard of hearing)    LBBB (left bundle branch block)    Mixed hyperlipidemia    NSTEMI (non-ST elevated myocardial infarction) [facility])    July 2020   Osteoarthritis    Postoperative nausea and vomiting    Morphine   Type 2 diabetes mellitus (HCC)     UTI (lower urinary tract infection)     Patient Active Problem List   Diagnosis Date Noted   Status post left hip replacement 04/29/2020   NSTEMI (non-ST elevated myocardial infarction) (HCC) 11/04/2018   Dysphagia 05/10/2018   Diabetes (HCC) 02/27/2018   S/P right THA, AA 03/01/2017   Postoperative anemia due to acute blood loss 05/22/2014   DJD (degenerative joint disease) of knee 05/20/2014   Primary localized osteoarthritis of left knee 04/30/2014   Preoperative cardiovascular examination 04/04/2014   Chronic diastolic heart failure (HCC) 07/03/2012   CAROTID ARTERY DISEASE 12/09/2009   Essential hypertension, benign 08/05/2008   CAD, NATIVE VESSEL 08/05/2008   Secondary cardiomyopathy (HCC) 08/05/2008   Aortic valve disorder 08/05/2008   Hyperlipidemia 08/03/2008    Past Surgical History:  Procedure Laterality Date   ABDOMINAL HYSTERECTOMY     partial   AORTIC VALVE REPLACEMENT  2006   Dr. Cornelius Moras - 23 mm [place] stentless cadaver valve   CARDIAC CATHETERIZATION  2020   with stents   CHOLECYSTECTOMY     CORONARY ARTERY BYPASS GRAFT  2006    Dr. Cornelius Moras - LIMA to LAD, SVG to circumflex   CORONARY STENT INTERVENTION N/A 11/06/2018   Procedure: CORONARY STENT INTERVENTION;  Surgeon: Yvonne Kendall, MD;  Location: [facility]  INVASIVE CV LAB;  Service: Cardiovascular;  Laterality: N/A;   CORONARY/GRAFT ANGIOGRAPHY N/A 11/06/2018   Procedure: CORONARY/GRAFT ANGIOGRAPHY;  Surgeon: Yvonne KendallEnd, Christopher, MD;  Location: [facility] INVASIVE CV LAB;  Service: Cardiovascular;  Laterality: N/A;   THYROID LOBECTOMY Right    TOTAL HIP ARTHROPLASTY Right 03/01/2017   Procedure: RIGHT TOTAL HIP ARTHROPLASTY ANTERIOR APPROACH;  Surgeon: Durene Romanslin, Matthew, MD;  Location: [facility] ORS;  Service: Orthopedics;  Laterality: Right;  70 mins   TOTAL HIP ARTHROPLASTY Left 04/29/2020   Procedure: TOTAL HIP ARTHROPLASTY ANTERIOR APPROACH;  Surgeon: Durene Romanslin, Matthew, MD;  Location: [facility] ORS;  Service: Orthopedics;  Laterality: Left;  70  mins   TOTAL KNEE ARTHROPLASTY Right 2006   TOTAL KNEE ARTHROPLASTY Left 05/20/2014   DR Thurston HoleWAINER   TOTAL KNEE ARTHROPLASTY Left 05/20/2014   Procedure: LEFT TOTAL KNEE ARTHROPLASTY;  Surgeon: Nilda Simmerobert A Wainer, MD;  [facility];  Service: Orthopedics;  Laterality: Left;     OB History   No obstetric history on file.     Family History  Problem Relation Age of Onset   Heart Problems Mother    Diabetes Father    Cirrhosis Father    Diabetes Other     Social History   Tobacco Use   Smoking status: Never   Smokeless tobacco: Never  Vaping Use   Vaping Use: Never used  Substance Use Topics   Alcohol use: No    Alcohol/week: 0.0 standard drinks   Drug use: No    Home Medications Prior to Admission medications   Medication Sig Start Date End Date Taking? Authorizing Provider  APPLE CIDER VINEGAR PO Take 1 tablet by mouth every morning.    [provider]  aspirin EC 81 MG tablet Take 1 tablet (81 mg total) by mouth daily. Swallow whole. 06/26/20   Jonelle SidleMcDowell, Samuel G, MD  Cholecalciferol (VITAMIN D3) 125 MCG (5000 UT) CAPS Take 5,000 Units by mouth daily.    [provider]  CINNAMON PO Take 1 tablet by mouth 2 (two) times daily.    [provider]  Coenzyme Q10 (COQ10 PO) Take 1 tablet by mouth daily.    [provider]  Cyanocobalamin (B-12) 2500 MCG TABS Take 2,500 mcg by mouth daily.    [provider]  docusate sodium (COLACE) 100 MG capsule Take 1 capsule (100 mg total) by mouth 2 (two) times daily. 04/30/20   Lanney GinsBabish, Matthew, PA-C  famotidine (PEPCID) 20 MG tablet Take 1 tablet (20 mg total) by mouth 2 (two) times daily. Patient taking differently: Take 20 mg by mouth daily. 11/07/18   Mikhail, Nita SellsMaryann, DO  furosemide (LASIX) 40 MG tablet Take 1 tablet (40 mg total) by mouth 2 (two) times daily. 12/17/19   Jonelle SidleMcDowell, Samuel G, MD  levothyroxine (SYNTHROID) 88 MCG tablet Take 88 mcg by mouth daily before breakfast.    [provider]  losartan (COZAAR) 25 MG tablet Take 0.5 tablets (12.5 mg total) by mouth daily. 05/02/20   Jonelle SidleMcDowell, Samuel G, MD  metFORMIN (GLUCOPHAGE) 500 MG tablet Take 1 tablet (500 mg total) by mouth 2 (two) times daily with a meal. 11/10/18   Mikhail, Nita SellsMaryann, DO  metolazone (ZAROXOLYN) 5 MG tablet Take 1 tablet as needed for swelling Patient taking differently: Take by mouth daily as needed (swelling). Take 1 tablet as needed for swelling 11/22/17   Jonelle SidleMcDowell, Samuel G, MD  metoprolol succinate (TOPROL-XL) 50 MG 24 hr tablet Take 1 tablet (50 mg total) by mouth daily. 07/14/18  Jonelle Sidle, MD  Multiple Vitamins-Minerals (HAIR/SKIN/NAILS/BIOTIN) TABS Take 1 tablet by mouth daily.    [provider]  Multiple Vitamins-Minerals (MULTIVITAMIN PO) Take 1 tablet by mouth daily.    [provider]  Polyethyl Glycol-Propyl Glycol (SYSTANE OP) Place 1 drop into both eyes 4 (four) times daily as needed (for burning eyes).     [provider]  polyethylene glycol (MIRALAX / GLYCOLAX) 17 g packet Take 17 g by mouth 2 (two) times daily. 04/30/20   Lanney Gins, PA-C  potassium chloride SA (K-DUR,KLOR-CON) 20 MEQ tablet Take 1 tablet (20 mEq total) by mouth daily after supper. 01/26/17   Jonelle Sidle, MD  rosuvastatin (CRESTOR) 5 MG tablet Take 1 tablet (5 mg total) by mouth daily. 07/17/20   Jonelle Sidle, MD  sertraline (ZOLOFT) 100 MG tablet Take 100 mg by mouth at bedtime.    [provider]  spironolactone (ALDACTONE) 25 MG tablet Take 0.5 tablets (12.5 mg total) by mouth daily. 07/06/16   Laqueta Linden, MD  trimethoprim (TRIMPEX) 100 MG tablet Take 100 mg by mouth daily.    [provider]  UNABLE TO FIND Med Name: Asencion Islam WITH IRON    [provider]  vitamin C (ASCORBIC ACID) 500 MG tablet Take 500 mg by mouth daily.    [provider]  Vitamin E 180 MG (400 UNIT) CAPS Take 400 Units by mouth daily.    [provider]    Allergies    Lipitor [atorvastatin], Morphine, Penicillins, and Reclast [zoledronic acid]  Review of Systems   Review of Systems  Constitutional:  Negative for chills and fever.  HENT:  Negative for ear pain and sore throat.   Eyes:  Negative for pain and visual disturbance.  Respiratory:  Negative for cough and shortness of breath.   Cardiovascular:  Positive for chest pain. Negative for palpitations.  Gastrointestinal:  Negative for abdominal pain and vomiting.  Genitourinary:  Negative for dysuria and hematuria.  Musculoskeletal:  Negative for arthralgias and back pain.  Skin:  Negative for color change and rash.  Neurological:  Negative for seizures and syncope.  All other systems reviewed and are negative.  Physical Exam Updated Vital Signs BP (!) 134/58 (BP Location: Left Arm)   Pulse 74   Temp 97.8 F (36.6 C) (Oral)   Resp (!) 22   Ht 1.626 m (5\' 4" )   Wt 59 kg   LMP  (LMP Unknown)   SpO2 99%   BMI 22.31 kg/m   Physical Exam Vitals and nursing note reviewed.  Constitutional:      General: She is not in acute distress.    Appearance: Normal appearance. She is well-developed. She is not ill-appearing or toxic-appearing.  HENT:     Head: Normocephalic and atraumatic.  Eyes:     Extraocular Movements: Extraocular movements intact.     Conjunctiva/sclera: Conjunctivae normal.  Cardiovascular:     Rate and Rhythm: Normal rate and regular rhythm.     Heart sounds: No murmur heard. Pulmonary:     Effort: Pulmonary effort is normal. No respiratory distress.     Breath sounds: Normal breath sounds.     Comments: Well-healed medial sternotomy scar Chest:     Chest wall: No tenderness.  Abdominal:     Palpations: Abdomen is soft.     Tenderness: There is no abdominal tenderness.  Musculoskeletal:        General: Normal range of motion.  Cervical back: Normal range of motion and neck supple. No rigidity.  Skin:    General: Skin is warm and  dry.  Neurological:     General: No focal deficit present.     Mental Status: She is alert and oriented to person, place, and time.    ED Results / Procedures / Treatments   Labs (all labs ordered are listed, but only abnormal results are displayed) Labs Reviewed  CBC WITH DIFFERENTIAL/PLATELET  BASIC METABOLIC PANEL  TROPONIN I (HIGH SENSITIVITY)    EKG None  Radiology No results found.  Procedures Procedures   Medications Ordered in ED Medications - No data to display  ED Course  I have reviewed the triage vital signs and the nursing notes.  Pertinent labs & imaging results that were available during my care of the patient were reviewed by me and considered in my medical decision making (see chart for details).  CRITICAL CARE Performed by: Vanetta Mulders Total critical care time: 35 minutes Critical care time was exclusive of separately billable procedures and treating other patients. Critical care was necessary to treat or prevent imminent or life-threatening deterioration. Critical care was time spent personally by me on the following activities: development of treatment plan with patient and/or surrogate as well as nursing, discussions with consultants, evaluation of patient's response to treatment, examination of patient, obtaining history from patient or surrogate, ordering and performing treatments and interventions, ordering and review of laboratory studies, ordering and review of radiographic studies, pulse oximetry and re-evaluation of patient's condition.    MDM Rules/Calculators/A&P                          Patient with significant cardiac history.  Onset of chest pain at 7 this morning.  Will get cardiac labs nursing's getting EKG.  We will get portable chest x-ray.  Patient's work-up EKG without acute findings.  Chest x-ray negative.  First troponin was 13.  Hemoglobin 10.5.  Her lites significant for some mild hypokalemia with a potassium of 3.3.   Patient renal function is unchanged from baseline.  However patient has persistent chest pain.  No relief.  Patient's heart score is 5  Will discuss with cardiology.  Feel the patient probably warrants admission and formal rule out.  Does not normally get chest pain like this.  Patient has nausea and vomiting trouble with morphine.  But we will try a low-dose of morphine along with Zofran.  Discussed with cardiology.  They recommended based on her heart score and the persistent pain and her living situation the patient be admitted for rule out.  Hospitalist will admit.  Final Clinical Impression(s) / ED Diagnoses Final diagnoses:  Precordial pain    Rx / DC Orders ED Discharge Orders     None        Vanetta Mulders, MD 11/06/20 1016    Vanetta Mulders, MD 11/06/20 1234

## 2020-11-06 NOTE — ED Triage Notes (Signed)
Pt having substernal chest pain, pt has extensive cardiac  history. Pt chest pain does not radiate. Pt allergic to morphine. Pt was give .4 nitro and 4 baby Asprin. Pt chest pain did not go away with nitro, pt BP decreased from 150/90 to 95/60. Pt stated that the her pain is am 8 on a scale of 0 to 10. Pt states she is feeling chest pressure over the center of her chest.

## 2020-11-06 NOTE — ED Notes (Signed)
Patient ambulatory to the restroom.

## 2020-11-06 NOTE — Consult Note (Signed)
Cardiology Consultation:   Patient ID: Erin Herman MRN: [id number]; DOB: 1927/11/08  Admit date: 11/06/2020 Date of Consult: 11/06/2020  PCP:  Kirstie PeriShah, Ashish, MD   [facility] Providers Cardiologist:  Nona DellSamuel McDowell, MD        Patient Profile:   Erin Herman is a 87 y.o. female with a hx of CAD (CABG in 2006, last cath 10/2018 - patent LIMA-LAD, occluded SVG-OM1, DES to pLCx), bioprosthetic AVR 2006, chronic LBBB on ECG, anemia, CKD, hypertension, GERD, left hip arthroplasty Jan 22, and anxiety who is being seen 11/06/2020 for the evaluation of chest tightness and abdominal discomfort at the request of Shon HaleEmokpae, Courage, MD.  History of Present Illness:   [name] reports chest tightness starting in the morning at around 7 am with the discomfort moving down to her lower abdomen later in the day. She states that her belly feels distended and she hasn't had a bowel movement today. Has been having episodes of indigestion/heartburn off and on for a few days. No nausea or vomiting. Denies any shortness of breath, diaphoresis, pre-syncope or syncope. The pain/pressure has been near constant for the past 3-4 hours. EMS gave her NTG that dropped her systolic BP to the 90s.   [facility] the vitals were: BP 134/58 mmHg, HR 74 bpm and RR 14. Labs were: Na 135, K 3.3, gluc 134, BUN 40, cr 1.02, WBC 4.4, hgb 10.5, hct 31.5 and plt 188. CXR showed no acute pathology. ECG documented LBBB with no new changes. Hs-Trops were 13 and 34.  Started on IV heparin for ongoing chest/abdominal pressure.   Past Medical History:  Diagnosis Date   Anemia    iron   Anxiety    Aortic stenosis    Bioprosthetic AVR 2006   CAD (coronary artery disease), native coronary artery    Multivessel status post CABG 2006, occluded SVG to circumflex, DES to circumflex July 2020   Cardiomyopathy [facility](HCC)    Chronic kidney disease    Depression    Dysphagia    Essential hypertension     Family history of adverse reaction to anesthesia    N&V   GERD (gastroesophageal reflux disease)    Headache    History of hiatal hernia    HOH (hard of hearing)    LBBB (left bundle branch block)    Mixed hyperlipidemia    NSTEMI (non-ST elevated myocardial infarction) [facility](HCC)    July 2020   Osteoarthritis    Postoperative nausea and vomiting    Morphine   Type 2 diabetes mellitus (HCC)    UTI (lower urinary tract infection)     Past Surgical History:  Procedure Laterality Date   ABDOMINAL HYSTERECTOMY     partial   AORTIC VALVE REPLACEMENT  2006   Dr. Cornelius Moraswen - 23 mm Toronto stentless cadaver valve   CARDIAC CATHETERIZATION  2020   with stents   CHOLECYSTECTOMY     CORONARY ARTERY BYPASS GRAFT  2006    Dr. Cornelius Moraswen - LIMA to LAD, SVG to circumflex   CORONARY STENT INTERVENTION N/A 11/06/2018   Procedure: CORONARY STENT INTERVENTION;  Surgeon: Yvonne KendallEnd, Christopher, MD;  Location: [facility] INVASIVE CV LAB;  Service: Cardiovascular;  Laterality: N/A;   CORONARY/GRAFT ANGIOGRAPHY N/A 11/06/2018   Procedure: CORONARY/GRAFT ANGIOGRAPHY;  Surgeon: Yvonne KendallEnd, Christopher, MD;  Location: [facility] INVASIVE CV LAB;  Service: Cardiovascular;  Laterality: N/A;   THYROID LOBECTOMY Right    TOTAL HIP ARTHROPLASTY Right 03/01/2017   Procedure: RIGHT TOTAL HIP ARTHROPLASTY  ANTERIOR APPROACH;  Surgeon: Durene Romans, MD;  Location: [facility] ORS;  Service: Orthopedics;  Laterality: Right;  70 mins   TOTAL HIP ARTHROPLASTY Left 04/29/2020   Procedure: TOTAL HIP ARTHROPLASTY ANTERIOR APPROACH;  Surgeon: Durene Romans, MD;  Location: [facility] ORS;  Service: Orthopedics;  Laterality: Left;  70 mins   TOTAL KNEE ARTHROPLASTY Right 2006   TOTAL KNEE ARTHROPLASTY Left 05/20/2014   DR Thurston Hole   TOTAL KNEE ARTHROPLASTY Left 05/20/2014   Procedure: LEFT TOTAL KNEE ARTHROPLASTY;  Surgeon: Nilda Simmer, MD;  Location: [facility];  Service: Orthopedics;  Laterality: Left;     Home Medications:  Prior to Admission medications   Medication Sig Start  Date End Date Taking? Authorizing Provider  APPLE CIDER VINEGAR PO Take 1 tablet by mouth every morning.   Yes [provider]  aspirin EC 81 MG tablet Take 1 tablet (81 mg total) by mouth daily. Swallow whole. 06/26/20  Yes Jonelle Sidle, MD  Cholecalciferol (VITAMIN D3) 125 MCG (5000 UT) CAPS Take 5,000 Units by mouth daily.   Yes [provider]  CINNAMON PO Take 1 tablet by mouth 2 (two) times daily.   Yes [provider]  Coenzyme Q10 (COQ10 PO) Take 1 tablet by mouth daily.   Yes [provider]  CRANBERRY PO Take 1 tablet by mouth daily.   Yes [provider]  Cyanocobalamin (B-12) 2500 MCG TABS Take 2,500 mcg by mouth daily.   Yes [provider]  famotidine (PEPCID) 20 MG tablet Take 1 tablet (20 mg total) by mouth 2 (two) times daily. Patient taking differently: Take 20 mg by mouth daily. 11/07/18  Yes Mikhail, Nita Sells, DO  furosemide (LASIX) 40 MG tablet Take 1 tablet (40 mg total) by mouth 2 (two) times daily. 12/17/19  Yes Jonelle Sidle, MD  levothyroxine (SYNTHROID) 88 MCG tablet Take 88 mcg by mouth daily before breakfast.   Yes [provider]  losartan (COZAAR) 25 MG tablet Take 0.5 tablets (12.5 mg total) by mouth daily. 05/02/20  Yes Jonelle Sidle, MD  metFORMIN (GLUCOPHAGE) 500 MG tablet Take 1 tablet (500 mg total) by mouth 2 (two) times daily with a meal. 11/10/18  Yes Mikhail, [place], DO  metolazone (ZAROXOLYN) 5 MG tablet Take 1 tablet as needed for swelling 11/22/17  Yes Jonelle Sidle, MD  metoprolol succinate (TOPROL-XL) 50 MG 24 hr tablet Take 1 tablet (50 mg total) by mouth daily. 07/14/18  Yes Jonelle Sidle, MD  Multiple Vitamins-Minerals (HAIR/SKIN/NAILS/BIOTIN) TABS Take 1 tablet by mouth daily.   Yes [provider]  Multiple Vitamins-Minerals (MULTIVITAMIN PO) Take 1 tablet by mouth daily.   Yes [provider]  Polyethyl Glycol-Propyl Glycol (SYSTANE OP) Place 1 drop  into both eyes daily.   Yes [provider]  polyethylene glycol (MIRALAX / GLYCOLAX) 17 g packet Take 17 g by mouth 2 (two) times daily. 04/30/20  Yes Babish, Molli Hazard, PA-C  potassium chloride SA (K-DUR,KLOR-CON) 20 MEQ tablet Take 1 tablet (20 mEq total) by mouth daily after supper. 01/26/17  Yes Jonelle Sidle, MD  rosuvastatin (CRESTOR) 5 MG tablet Take 1 tablet (5 mg total) by mouth daily. 07/17/20  Yes Jonelle Sidle, MD  sertraline (ZOLOFT) 100 MG tablet Take 50 mg by mouth at bedtime.   Yes [provider]  spironolactone (ALDACTONE) 25 MG tablet Take 0.5 tablets (12.5 mg total) by mouth daily. 07/06/16  Yes Laqueta Linden, MD  trimethoprim (TRIMPEX) 100 MG tablet  Take 100 mg by mouth daily.   Yes [provider]  vitamin C (ASCORBIC ACID) 500 MG tablet Take 500 mg by mouth in the morning and at bedtime.   Yes [provider]  Vitamin E 180 MG (400 UNIT) CAPS Take 400 Units by mouth daily.   Yes [provider]  docusate sodium (COLACE) 100 MG capsule Take 1 capsule (100 mg total) by mouth 2 (two) times daily. Patient not taking: No sig reported 04/30/20   Lanney Gins, PA-C    Inpatient Medications: Scheduled Meds:  morphine  1 mg Intravenous Once   ondansetron  4 mg Intravenous Once   potassium chloride  40 mEq Oral Once   tamsulosin  0.4 mg Oral QPC supper   Continuous Infusions:  heparin 700 Units/hr (11/06/20 1307)   PRN Meds: fentaNYL (SUBLIMAZE) injection  Allergies:    Allergies  Allergen Reactions   Lipitor [Atorvastatin]     MUSCLE ACHES/FATIGUE   Morphine Nausea And Vomiting   Penicillins Swelling and Other (See Comments)    Tolerated Cephalosporin Date: 04/29/20.  Severe arm swelling and redness Did it involve swelling of the face/tongue/throat, SOB, or low BP? No Did it involve sudden or severe rash/hives, skin peeling, or any reaction on the inside of your mouth or nose? No Did you need to seek medical  attention at a hospital or doctor's office? No When did it last happen?      30 years If all above answers are "NO", may proceed with cephalosporin use.   Reclast [Zoledronic Acid]     "nausea and vomiting. Could not get out of bed.    Social History:   Social History   Socioeconomic History   Marital status: Married    Spouse name: Not on file   Number of children: Not on file   Years of education: Not on file   Highest education level: Not on file  Occupational History   Not on file  Tobacco Use   Smoking status: Never   Smokeless tobacco: Never  Vaping Use   Vaping Use: Never used  Substance and Sexual Activity   Alcohol use: No    Alcohol/week: 0.0 standard drinks   Drug use: No   Sexual activity: Not Currently  Other Topics Concern   Not on file  Social History Narrative   Not on file   Social Determinants of Health   Financial Resource Strain: Not on file  Food Insecurity: Not on file  Transportation Needs: Not on file  Physical Activity: Not on file  Stress: Not on file  Social Connections: Not on file  Intimate Partner Violence: Not on file    Family History:    Family History  Problem Relation Age of Onset   Heart Problems Mother    Diabetes Father    Cirrhosis Father    Diabetes Other      ROS:  Please see the history of present illness.  All other ROS reviewed and negative.     Physical Exam/Data:   Vitals:   11/06/20 1000 11/06/20 1030 11/06/20 1300 11/06/20 1330  BP: (!) 146/78 137/81 135/62 138/75  Pulse: 71 99 63 70  Resp: Temp:      TempSrc:      SpO2: 99% 99% 100% 99%  Weight:      Height:        Intake/Output Summary (Last 24 hours) at 11/06/2020 1335 Last data filed at 11/06/2020 1323 Gross per  24 hour  Intake --  Output 500 ml  Net -500 ml   Last 3 Weights 11/06/2020 06/26/2020 04/29/2020  Weight (lbs) 130 lb 134 lb 129 lb  Weight (kg) 58.968 kg 60.782 kg 58.514 kg     Body mass index is 22.31 kg/m.   General:  Well nourished, well developed, in no acute distress HEENT: normal Lymph: no adenopathy Neck: no JVD Endocrine:  No thryomegaly Vascular: No carotid bruits; FA pulses 2+ bilaterally without bruits  Cardiac:  normal S1, S2; RRR; no murmur, well-healed sternotomy scar Lungs:  clear to auscultation bilaterally, no wheezing, rhonchi or rales  Abd: soft, nontender, no hepatomegaly  Ext: no edema Musculoskeletal:  No deformities, BUE and BLE strength normal and equal Skin: warm and dry  Neuro:  CNs 2-12 intact, no focal abnormalities noted Psych:  Normal affect   EKG:  The EKG was personally reviewed and demonstrates:  LBBB Telemetry:  Telemetry was personally reviewed and demonstrates:  sinus rhythm, rate 70s  Relevant CV Studies: CARDIAC CATH 11/06/2018 Conclusions: Severe three-vessel coronary artery disease, including 90% proximal/mid LAD disease, sequential 95% and 50% proximal/mid LCx lesions, long 80% OM1 stenosis, and severe diffuse disease involving non-dominant RCA. Widely patent LIMA-LAD. Chronically occluded SVG-LCx (most likely grafted to OM1). Successful PCI to 95% proximal LCx stenosis using Resolute Onyx 3.5 x 12 mm drug-eluting stent with 0% residual stenosis and TIMI-3 flow. Recommendations: Dual antiplatelet therapy with aspirin and ticagrelor for at least 12 months.  Discontinuation of aspirin after 3 months could be considered to reduce bleeding risk. Obtain limited echo to evaluate LVEF in the setting of NSTEMI. Aggressive secondary prevention. Medical therapy for OM1 disease.  If the patient has refractory angina despite maximal tolerated antianginal therapy, PCI could be considered. Gentle post-catheterization hydration given chronic kidney disease.   Echocardiogram 06/07/2019 1. Left ventricular ejection fraction, by estimation, is 35 to 40%. The left ventricle has moderately decreased function. The left ventrical demonstrates regional wall  motion abnormalities (see scoring diagram/findings for description). There is mildly increased asymmetric left ventricular hypertrophy of the posterior segment. Left ventricular diastolic parameters are consistent with Grade II diastolic dysfunction (pseudonormalization). Elevated left ventricular end-diastolic pressure. 2. Right ventricular systolic function is mildly reduced. The right ventricular size is moderately enlarged. There is mildly elevated pulmonary artery systolic pressure. 3. Left atrial size was moderately dilated. 4. Right atrial size was mildly dilated. 5. The mitral valve is degenerative. Moderate mitral valve regurgitation. 6. Tricuspid valve regurgitation is mild to moderate. 7. The aortic valve has been repaired/replaced. Aortic valve regurgitation is not visualized. No aortic stenosis is present. 78mm Toronto bioprosthetic valve valve is present in the aortic position. Echo findings show normal structure and function of the aortic prosthesis. 8. The inferior vena cava is normal in size with greater than 50% respiratory variability, suggesting right atrial pressure of 3 mmHg.    Laboratory Data:  High Sensitivity Troponin:   Recent Labs  Lab 11/06/20 0851 11/06/20 1039  TROPONINIHS 13 34*     Chemistry Recent Labs  Lab 11/06/20 0851  NA 135  K 3.3*  CL 96*  CO2 30  GLUCOSE 134*  BUN 40*  CREATININE 1.02*  CALCIUM 8.9  GFRNONAA 52*  ANIONGAP 9    No results for input(s): PROT, ALBUMIN, AST, ALT, ALKPHOS, BILITOT in the last 168 hours. Hematology Recent Labs  Lab 11/06/20 0851  WBC 4.4  RBC 3.23*  HGB 10.5*  HCT 31.5*  MCV 97.5  MCH 32.5  MCHC 33.3  RDW 12.3  PLT 188   BNPNo results for input(s): BNP, PROBNP in the last 168 hours.  DDimer No results for input(s): DDIMER in the last 168 hours.   Radiology/Studies:  DG Chest Port 1 View  Result Date: 11/06/2020 CLINICAL DATA:  87 year old female with history of substernal chest pain.  EXAM: PORTABLE CHEST 1 VIEW COMPARISON:  Chest x-ray 05/07/2014. FINDINGS: Lung volumes are normal. No consolidative airspace disease. No pleural effusions. No pneumothorax. No pulmonary nodule or mass noted. Pulmonary vasculature and the cardiomediastinal silhouette are within normal limits. Atherosclerosis in the thoracic aorta. Status post median sternotomy for CABG. IMPRESSION: 1. No radiographic evidence of acute cardiopulmonary disease. 2. Aortic atherosclerosis. Electronically Signed   By: Trudie Reed M.D.   On: 11/06/2020 08:59     Assessment and Plan:   Chest / Abdominal discomfort The patient has known CAD with a prior CABG in 2006 and a more recent PCI of the pLCx in 2020. She presents with vague chest/abdominal symptoms and is unclear on whether these mimic what she felt in the past with her MIs.  Her vitals are stable (though she became hypotensive transiently with NTG). She is still on ASA (stopped Ticagrelor earlier in the year on the recommendation of her physician).   -Continue to trend cardiac enzymes -Monitor on telemetry -Continue ASA 81 mg daily -Continue rosuvastatin -Unfractionated heparin IV infusion -Consider transfer to [facility] (in case she requires invasive work-up for ischemia)   2. Chronic systolic heart failure Last echo in Feb 2021 her EF was 35-40%. There was moderate MR. Aortic bioprosthesis was functioning normally. The patient feels that her abdomen is more distended. She takes Lasix daily. Metolazone only as needed.  -Continue losartan, metoprolol, spironolactone -Hold lasix for now (can be dosed as needed) -Check a BNP level -Strict I and Os, daily weights, - Consider repeat echocardiogram  For questions or updates, please contact [facility] Please consult [email or web address] for contact info under    Signed, Lonie Peak, MD  11/06/2020 1:35 PM

## 2020-11-07 ENCOUNTER — Encounter (HOSPITAL_COMMUNITY)
Admission: EM | Disposition: A | Discharge status: Home / Self Care | Payer: Self-pay | Source: Home / Self Care | Attending: Emergency Medicine

## 2020-11-07 ENCOUNTER — Observation Stay (HOSPITAL_BASED_OUTPATIENT_CLINIC_OR_DEPARTMENT_OTHER): Payer: Medicare Other

## 2020-11-07 ENCOUNTER — Encounter (HOSPITAL_COMMUNITY): Payer: Self-pay | Admitting: Cardiovascular Disease

## 2020-11-07 DIAGNOSIS — I2510 Atherosclerotic heart disease of native coronary artery without angina pectoris: Secondary | ICD-10-CM | POA: Diagnosis not present

## 2020-11-07 DIAGNOSIS — R9431 Abnormal electrocardiogram [ECG] [EKG]: Secondary | ICD-10-CM | POA: Diagnosis not present

## 2020-11-07 DIAGNOSIS — R072 Precordial pain: Secondary | ICD-10-CM

## 2020-11-07 DIAGNOSIS — I5032 Chronic diastolic (congestive) heart failure: Secondary | ICD-10-CM

## 2020-11-07 DIAGNOSIS — I200 Unstable angina: Secondary | ICD-10-CM | POA: Diagnosis not present

## 2020-11-07 DIAGNOSIS — I25710 Atherosclerosis of autologous vein coronary artery bypass graft(s) with unstable angina pectoris: Secondary | ICD-10-CM

## 2020-11-07 DIAGNOSIS — R079 Chest pain, unspecified: Secondary | ICD-10-CM | POA: Diagnosis not present

## 2020-11-07 DIAGNOSIS — I359 Nonrheumatic aortic valve disorder, unspecified: Secondary | ICD-10-CM | POA: Diagnosis not present

## 2020-11-07 DIAGNOSIS — I429 Cardiomyopathy, unspecified: Secondary | ICD-10-CM

## 2020-11-07 DIAGNOSIS — I10 Essential (primary) hypertension: Secondary | ICD-10-CM

## 2020-11-07 DIAGNOSIS — I25110 Atherosclerotic heart disease of native coronary artery with unstable angina pectoris: Secondary | ICD-10-CM

## 2020-11-07 HISTORY — PX: LEFT HEART CATH AND CORS/GRAFTS ANGIOGRAPHY: CATH118250

## 2020-11-07 LAB — CBC
HCT: 31.5 % — ABNORMAL LOW (ref 36.0–46.0)
Hemoglobin: 10.8 g/dL — ABNORMAL LOW (ref 12.0–15.0)
MCH: 32.4 pg (ref 26.0–34.0)
MCHC: 34.3 g/dL (ref 30.0–36.0)
MCV: 94.6 fL (ref 80.0–100.0)
Platelets: 205 10*3/uL (ref 150–400)
RBC: 3.33 MIL/uL — ABNORMAL LOW (ref 3.87–5.11)
RDW: 12.4 % (ref 11.5–15.5)
WBC: 6.3 10*3/uL (ref 4.0–10.5)
nRBC: 0 % (ref 0.0–0.2)

## 2020-11-07 LAB — BASIC METABOLIC PANEL
Anion gap: 10 (ref 5–15)
BUN: 31 mg/dL — ABNORMAL HIGH (ref 8–23)
CO2: 27 mmol/L (ref 22–32)
Calcium: 9.6 mg/dL (ref 8.9–10.3)
Chloride: 97 mmol/L — ABNORMAL LOW (ref 98–111)
Creatinine, Ser: 1.02 mg/dL — ABNORMAL HIGH (ref 0.44–1.00)
GFR, Estimated: 52 mL/min — ABNORMAL LOW (ref >60)
Glucose, Bld: 138 mg/dL — ABNORMAL HIGH (ref 70–99)
Potassium: 4 mmol/L (ref 3.5–5.1)
Sodium: 134 mmol/L — ABNORMAL LOW (ref 135–145)

## 2020-11-07 LAB — ECHOCARDIOGRAM COMPLETE
AR max vel: 1.1 cm2
AV Area VTI: 0.89 cm2
AV Area mean vel: 1.06 cm2
AV Mean grad: 11 mmHg
AV Peak grad: 17.8 mmHg
Ao pk vel: 2.11 m/s
Area-P 1/2: 3.85 cm2
Calc EF: 48.4 %
Height: 64 in
MV M vel: 4.05 m/s
MV Peak grad: 65.6 mmHg
Radius: 0.3 cm
S' Lateral: 3.1 cm
Single Plane A2C EF: 44.6 %
Single Plane A4C EF: 49.6 %
Weight: 2100.79 oz

## 2020-11-07 LAB — GLUCOSE, CAPILLARY
Glucose-Capillary: 168 mg/dL — ABNORMAL HIGH (ref 70–99)
Glucose-Capillary: 98 mg/dL (ref 70–99)
Glucose-Capillary: 196 mg/dL — ABNORMAL HIGH (ref 70–99)
Glucose-Capillary: 118 mg/dL — ABNORMAL HIGH (ref 70–99)

## 2020-11-07 LAB — TROPONIN I (HIGH SENSITIVITY): Troponin I (High Sensitivity): 25 ng/L — ABNORMAL HIGH (ref <18)

## 2020-11-07 LAB — LIPID PANEL
Cholesterol: 134 mg/dL (ref 0–200)
HDL: 37 mg/dL — ABNORMAL LOW (ref >40)
LDL Cholesterol: 78 mg/dL (ref 0–99)
Total CHOL/HDL Ratio: 3.6 RATIO
Triglycerides: 93 mg/dL (ref <150)
VLDL: 19 mg/dL (ref 0–40)

## 2020-11-07 LAB — LIPASE, BLOOD: Lipase: 34 U/L (ref 11–51)

## 2020-11-07 LAB — HEPARIN LEVEL (UNFRACTIONATED): Heparin Unfractionated: 0.22 IU/mL — ABNORMAL LOW (ref 0.30–0.70)

## 2020-11-07 SURGERY — LEFT HEART CATH AND CORS/GRAFTS ANGIOGRAPHY
Anesthesia: LOCAL

## 2020-11-07 MED ORDER — FENTANYL CITRATE (PF) 100 MCG/2ML IJ SOLN
INTRAMUSCULAR | Status: AC
  Filled 2020-11-07: qty 2

## 2020-11-07 MED ORDER — VERAPAMIL HCL 2.5 MG/ML IV SOLN
INTRAVENOUS | Status: AC
  Filled 2020-11-07: qty 2

## 2020-11-07 MED ORDER — SODIUM CHLORIDE 0.9% FLUSH: 3.0000 mL | INTRAVENOUS | Status: DC | PRN

## 2020-11-07 MED ORDER — ASPIRIN 81 MG PO CHEW: 81.0000 mg | CHEWABLE_TABLET | ORAL | Status: DC

## 2020-11-07 MED ORDER — HEPARIN (PORCINE) IN NACL 1000-0.9 UT/500ML-% IV SOLN
INTRAVENOUS | Status: DC | PRN
  Administered 2020-11-07 (×2): 500 mL

## 2020-11-07 MED ORDER — IOHEXOL 350 MG/ML SOLN
INTRAVENOUS | Status: DC | PRN
  Administered 2020-11-07: 50 mL

## 2020-11-07 MED ORDER — LABETALOL HCL 5 MG/ML IV SOLN: 10.0000 mg | INTRAVENOUS | Status: AC | PRN

## 2020-11-07 MED ORDER — MIDAZOLAM HCL 2 MG/2ML IJ SOLN
INTRAMUSCULAR | Status: AC
  Filled 2020-11-07: qty 2

## 2020-11-07 MED ORDER — SODIUM CHLORIDE 0.9 % IV SOLN: INTRAVENOUS | Status: AC

## 2020-11-07 MED ORDER — ISOSORBIDE MONONITRATE ER 30 MG PO TB24
15.0000 mg | ORAL_TABLET | Freq: Every day | ORAL | Status: DC
  Administered 2020-11-07 – 2020-11-08 (×2): 15 mg via ORAL
  Filled 2020-11-07 (×2): qty 1

## 2020-11-07 MED ORDER — SODIUM CHLORIDE 0.9 % IV SOLN: 250.0000 mL | INTRAVENOUS | Status: DC | PRN

## 2020-11-07 MED ORDER — ASPIRIN 81 MG PO CHEW
81.0000 mg | CHEWABLE_TABLET | Freq: Every day | ORAL | Status: DC
  Administered 2020-11-07 – 2020-11-08 (×2): 81 mg via ORAL
  Filled 2020-11-07 (×2): qty 1

## 2020-11-07 MED ORDER — HEPARIN SODIUM (PORCINE) 1000 UNIT/ML IJ SOLN
INTRAMUSCULAR | Status: AC
  Filled 2020-11-07: qty 1

## 2020-11-07 MED ORDER — SODIUM CHLORIDE 0.9% FLUSH
3.0000 mL | Freq: Two times a day (BID) | INTRAVENOUS | Status: DC
  Administered 2020-11-07 – 2020-11-08 (×2): 3 mL via INTRAVENOUS

## 2020-11-07 MED ORDER — HEPARIN SODIUM (PORCINE) 1000 UNIT/ML IJ SOLN
INTRAMUSCULAR | Status: DC | PRN
  Administered 2020-11-07: 3000 [IU] via INTRAVENOUS

## 2020-11-07 MED ORDER — HEPARIN (PORCINE) IN NACL 1000-0.9 UT/500ML-% IV SOLN
INTRAVENOUS | Status: AC
  Filled 2020-11-07: qty 1000

## 2020-11-07 MED ORDER — SODIUM CHLORIDE 0.9 % IV SOLN: INTRAVENOUS | Status: DC

## 2020-11-07 MED ORDER — LIDOCAINE HCL (PF) 1 % IJ SOLN
INTRAMUSCULAR | Status: DC | PRN
  Administered 2020-11-07: 2 mL

## 2020-11-07 MED ORDER — LIDOCAINE HCL (PF) 1 % IJ SOLN
INTRAMUSCULAR | Status: AC
  Filled 2020-11-07: qty 30

## 2020-11-07 MED ORDER — FENTANYL CITRATE (PF) 100 MCG/2ML IJ SOLN
INTRAMUSCULAR | Status: DC | PRN
  Administered 2020-11-07: 25 ug via INTRAVENOUS

## 2020-11-07 MED ORDER — HYDRALAZINE HCL 20 MG/ML IJ SOLN: 10.0000 mg | INTRAMUSCULAR | Status: AC | PRN

## 2020-11-07 MED ORDER — ROSUVASTATIN CALCIUM 5 MG PO TABS
10.0000 mg | ORAL_TABLET | Freq: Every day | ORAL | Status: DC
  Administered 2020-11-07 – 2020-11-08 (×2): 10 mg via ORAL
  Filled 2020-11-07: qty 2

## 2020-11-07 MED ORDER — VERAPAMIL HCL 2.5 MG/ML IV SOLN
INTRAVENOUS | Status: DC | PRN
  Administered 2020-11-07: 10 mL via INTRA_ARTERIAL

## 2020-11-07 MED ORDER — MIDAZOLAM HCL 2 MG/2ML IJ SOLN
INTRAMUSCULAR | Status: DC | PRN
  Administered 2020-11-07: 1 mg via INTRAVENOUS

## 2020-11-07 MED ORDER — SODIUM CHLORIDE 0.9% FLUSH
3.0000 mL | Freq: Two times a day (BID) | INTRAVENOUS | Status: DC
  Administered 2020-11-07: 3 mL via INTRAVENOUS

## 2020-11-07 MED ORDER — PANTOPRAZOLE SODIUM 40 MG PO TBEC
40.0000 mg | DELAYED_RELEASE_TABLET | Freq: Every day | ORAL | Status: DC
  Administered 2020-11-07 – 2020-11-08 (×2): 40 mg via ORAL
  Filled 2020-11-07 (×2): qty 1

## 2020-11-07 SURGICAL SUPPLY — 11 items
CATH INFINITI 5 FR IM (CATHETERS) ×1 IMPLANT
CATH INFINITI 5FR MULTPACK ANG (CATHETERS) ×1 IMPLANT
DEVICE RAD TR BAND REGULAR (VASCULAR PRODUCTS) ×1 IMPLANT
ELECT DEFIB PAD ADLT CADENCE (PAD) ×1 IMPLANT
GLIDESHEATH SLEND SS 6F .021 (SHEATH) ×1 IMPLANT
GUIDEWIRE INQWIRE 1.5J.035X260 (WIRE) IMPLANT
INQWIRE 1.5J .035X260CM (WIRE) ×4
KIT HEART LEFT (KITS) ×2 IMPLANT
PACK CARDIAC CATHETERIZATION (CUSTOM PROCEDURE TRAY) ×2 IMPLANT
TRANSDUCER W/STOPCOCK (MISCELLANEOUS) ×2 IMPLANT
TUBING CIL FLEX 10 FLL-RA (TUBING) ×2 IMPLANT

## 2020-11-07 NOTE — Progress Notes (Signed)
*  PRELIMINARY RESULTS* Echocardiogram 2D Echocardiogram has been performed.  Neomia Dear RDCS 11/07/2020, 1:49 PM

## 2020-11-07 NOTE — Progress Notes (Signed)
Cardiology Progress Note  Patient ID: Erin Herman MRN: [id number] DOB: [birth year] Date of Encounter: 11/07/2020  Primary Cardiologist: Nona Dell, MD  Subjective   Chief Complaint: Chest pain  HPI: Still reports chest pressure.  2 out of 10.  BP will not tolerate further antianginals.  I have recommended left heart catheterization.  She is in agreement.  She is still living at home.  She is not demented.  She is quite active for her age.  ROS:  All other ROS reviewed and negative. Pertinent positives noted in the HPI.     Inpatient Medications  Scheduled Meds:  vitamin C  500 mg Oral Daily   aspirin  81 mg Oral Daily   cholecalciferol  5,000 Units Oral Daily   famotidine  20 mg Oral Daily   furosemide  40 mg Oral BID   insulin aspart  0-5 Units Subcutaneous QHS   insulin aspart  0-6 Units Subcutaneous TID WC   isosorbide mononitrate  30 mg Oral Daily   levothyroxine  88 mcg Oral QAC breakfast   metoprolol succinate  50 mg Oral Daily   morphine  1 mg Intravenous Once   multivitamin with minerals  1 tablet Oral Daily   ondansetron  4 mg Intravenous Once   polyethylene glycol  17 g Oral BID   potassium chloride  40 mEq Oral Once   potassium chloride SA  20 mEq Oral QPC supper   rosuvastatin  5 mg Oral Daily   senna-docusate  2 tablet Oral QHS   sertraline  50 mg Oral QHS   sodium chloride flush  3 mL Intravenous Q12H   sodium chloride flush  3 mL Intravenous Q12H   spironolactone  12.5 mg Oral Daily   tamsulosin  0.4 mg Oral QPC supper   vitamin B-12  2,500 mcg Oral Daily   Vitamin E  400 Units Oral Daily   Continuous Infusions:  sodium chloride     heparin 850 Units/hr (11/07/20 0019)   PRN Meds: sodium chloride, acetaminophen **OR** acetaminophen, bisacodyl, fentaNYL (SUBLIMAZE) injection, ondansetron **OR** ondansetron (ZOFRAN) IV, polyethylene glycol, polyvinyl alcohol, sodium chloride flush, traZODone   Vital Signs   Vitals:   11/07/20 0013  11/07/20 0403 11/07/20 0500 11/07/20 0721  BP: (!) 96/59 (!) 88/48  (!) 87/54  Pulse: 83 75  70  Resp: 20 17  17   Temp: (!) 97.5 F (36.4 C) 97.6 F (36.4 C)  97.7 F (36.5 C)  TempSrc: Oral Oral  Oral  SpO2: 97% 95%  95%  Weight: 59.6 kg  59.6 kg   Height:        Intake/Output Summary (Last 24 hours) at 11/07/2020 0753 Last data filed at 11/07/2020 0735 Gross per 24 hour  Intake 706.16 ml  Output 1750 ml  Net -1043.84 ml   Last 3 Weights 11/07/2020 11/07/2020 11/06/2020  Weight (lbs) 131 lb 4.8 oz 131 lb 4.8 oz 129 lb 14.4 oz  Weight (kg) 59.557 kg 59.557 kg 58.922 kg      Telemetry  Overnight telemetry shows sinus rhythm in the 80s, which I personally reviewed.   ECG  The most recent ECG shows sinus rhythm, left bundle branch block, which I personally reviewed.   Physical Exam   Vitals:   11/07/20 0013 11/07/20 0403 11/07/20 0500 11/07/20 0721  BP: (!) 96/59 (!) 88/48  (!) 87/54  Pulse: 83 75  70  Resp: 20 17  17   Temp: (!) 97.5 F (36.4 C) 97.6 F (36.4 C)  97.7 F (36.5 C)  TempSrc: Oral Oral  Oral  SpO2: 97% 95%  95%  Weight: 59.6 kg  59.6 kg   Height:        Intake/Output Summary (Last 24 hours) at 11/07/2020 0753 Last data filed at 11/07/2020 0735 Gross per 24 hour  Intake 706.16 ml  Output 1750 ml  Net -1043.84 ml    Last 3 Weights 11/07/2020 11/07/2020 11/06/2020  Weight (lbs) 131 lb 4.8 oz 131 lb 4.8 oz 129 lb 14.4 oz  Weight (kg) 59.557 kg 59.557 kg 58.922 kg    Body mass index is 22.54 kg/m.  General: Well nourished, well developed, in no acute distress Head: Atraumatic, normal size  Eyes: PEERLA, EOMI  Neck: Supple, no JVD Endocrine: No thryomegaly Cardiac: Normal S1, S2; RRR; no murmurs, rubs, or gallops Lungs: Clear to auscultation bilaterally, no wheezing, rhonchi or rales  Abd: Soft, nontender, no hepatomegaly  Ext: No edema, pulses 2+ Musculoskeletal: No deformities, BUE and BLE strength normal and equal Skin: Warm and dry, no rashes    Neuro: Alert and oriented to person, place, time, and situation, CNII-XII grossly intact, no focal deficits  Psych: Normal mood and affect   Labs  High Sensitivity Troponin:   Recent Labs  Lab 11/06/20 0851 11/06/20 1039 11/06/20 1803 11/07/20 0540  TROPONINIHS 13 34* 42* 25*     Cardiac EnzymesNo results for input(s): TROPONINI in the last 168 hours. No results for input(s): TROPIPOC in the last 168 hours.  Chemistry Recent Labs  Lab 11/06/20 0851  NA 135  K 3.3*  CL 96*  CO2 30  GLUCOSE 134*  BUN 40*  CREATININE 1.02*  CALCIUM 8.9  GFRNONAA 52*  ANIONGAP 9    Hematology Recent Labs  Lab 11/06/20 0851  WBC 4.4  RBC 3.23*  HGB 10.5*  HCT 31.5*  MCV 97.5  MCH 32.5  MCHC 33.3  RDW 12.3  PLT 188   BNPNo results for input(s): BNP, PROBNP in the last 168 hours.  DDimer No results for input(s): DDIMER in the last 168 hours.   Radiology  DG Chest Port 1 View  Result Date: 11/06/2020 CLINICAL DATA:  87 year old female with history of substernal chest pain. EXAM: PORTABLE CHEST 1 VIEW COMPARISON:  Chest x-ray 05/07/2014. FINDINGS: Lung volumes are normal. No consolidative airspace disease. No pleural effusions. No pneumothorax. No pulmonary nodule or mass noted. Pulmonary vasculature and the cardiomediastinal silhouette are within normal limits. Atherosclerosis in the thoracic aorta. Status post median sternotomy for CABG. IMPRESSION: 1. No radiographic evidence of acute cardiopulmonary disease. 2. Aortic atherosclerosis. Electronically Signed   By: Trudie Reed M.D.   On: 11/06/2020 08:59    Cardiac Studies  TTE 06/07/2019  1. Left ventricular ejection fraction, by estimation, is 35 to 40%. The  left ventricle has moderately decreased function. The left ventrical  demonstrates regional wall motion abnormalities (see scoring  diagram/findings for description). There is mildly  increased asymmetric left ventricular hypertrophy of the posterior  segment. Left  ventricular diastolic parameters are consistent with Grade  II diastolic dysfunction (pseudonormalization). Elevated left ventricular  end-diastolic pressure.   2. Right ventricular systolic function is mildly reduced. The right  ventricular size is moderately enlarged. There is mildly elevated  pulmonary artery systolic pressure.   3. Left atrial size was moderately dilated.   4. Right atrial size was mildly dilated.   5. The mitral valve is degenerative. Moderate mitral valve regurgitation.   6. Tricuspid valve regurgitation is mild to  moderate.   7. The aortic valve has been repaired/replaced. Aortic valve  regurgitation is not visualized. No aortic stenosis is present. 23mm  [place] bioprosthetic valve valve is present in the aortic position. Echo  findings show normal structure and function of  the aortic prosthesis.   8. The inferior vena cava is normal in size with greater than 50%  respiratory variability, suggesting right atrial pressure of 3 mmHg.  LHC 11/06/2018  Severe three-vessel coronary artery disease, including 90% proximal/mid LAD disease, sequential 95% and 50% proximal/mid LCx lesions, long 80% OM1 stenosis, and severe diffuse disease involving non-dominant RCA. Widely patent LIMA-LAD. Chronically occluded SVG-LCx (most likely grafted to OM1). Successful PCI to 95% proximal LCx stenosis using Resolute Onyx 3.5 x 12 mm drug-eluting stent with 0% residual stenosis and TIMI-3 flow.   Recommendations: Dual antiplatelet therapy with aspirin and ticagrelor for at least 12 months.  Discontinuation of aspirin after 3 months could be considered to reduce bleeding risk. Obtain limited echo to evaluate LVEF in the setting of NSTEMI. Aggressive secondary prevention. Medical therapy for OM1 disease.  If the patient has refractory angina despite maximal tolerated antianginal therapy, PCI could be considered. Gentle post-catheterization hydration given chronic kidney  disease.   Patient Profile  Erin Herman is a 87 y.o. female with CAD status post CABG (recent PCI to proximal circumflex 10/2018), AVR, ischemic or myopathy (EF 35-40%), left bundle branch block, CKD, hypertension who was admitted on 11/06/2020 with unstable angina from [facility].  Assessment & Plan   Unstable angina/non-STEMI/three-vessel CAD status post CABG -Admitted with chest pressure that woke her up from sleep.  Did not tolerate nitroglycerin likely related to her cardiomyopathy.  Continues to have 2 out of 10 chest pressure.  Resting comfortably.  Troponin peak 42 and trending down.  EKG is without any change from prior.  She has a chronic left bundle branch block. -She appears euvolemic to me.  We will hold further diuresis. -Repeat echocardiogram is pending. -She is on aspirin, heparin drip and is on a statin.  She is on a beta-blocker. -I did review her left heart catheterization from 2020.  LIMA to LAD is patent.  Vein graft to circumflex is occluded.  She underwent PCI to a proximal 95% circumflex lesion.  She did have residual disease in the first obtuse marginal branch. -It was recommended she be considered for PCI to this if symptoms persisted.  She now presents with unstable chest pain symptoms. -She is active and stable at home.  No memory impairment.  I have recommended left heart catheterization today.  She is in agreement.  She understands the risk and benefits.  See discussion of risk and benefits below.  She is alert and oriented x4.  She is a very functional 87 year old.  She still lives at home. -Kidney function is stable. -LDL not quite at goal.  Increase Crestor to 10 mg daily.  2.  Ischemic cardiomyopathy, EF 35 to 40% -Chest x-ray clear.  No evidence of volume overload. -We will get a better idea of volume status on LVEDP at the time of left heart cath. -Continue metoprolol succinate.  Continue Aldactone.  Restart losartan after left heart  cath. -Repeat echocardiogram is pending.  Shared Decision Making/Informed Consent The risks [stroke (1 in 1000), death (1 in 1000), kidney failure [usually temporary] (1 in 500), bleeding (1 in 200), allergic reaction [possibly serious] (1 in 200)], benefits (diagnostic support and management of coronary artery disease) and alternatives of a cardiac  catheterization were discussed in detail with Ms. Bolda and she is willing to proceed.   For questions or updates, please contact [facility] Please consult [email or web address] for contact info under   Time Spent with Patient: I have spent a total of 35 minutes with patient reviewing hospital notes, telemetry, EKGs, labs and examining the patient as well as establishing an assessment and plan that was discussed with the patient.  > 50% of time was spent in direct patient care.    Signed, Lenna Gilford. Flora Lipps, MD, [facility] [place]  [facility]  11/07/2020 7:53 AM

## 2020-11-07 NOTE — H&P (View-Only) (Signed)
Cardiology Progress Note  Patient ID: Erin Herman MRN: [id number] DOB: [birth year] Date of Encounter: 11/07/2020  Primary Cardiologist: Nona Dell, MD  Subjective   Chief Complaint: Chest pain  HPI: Still reports chest pressure.  2 out of 10.  BP will not tolerate further antianginals.  I have recommended left heart catheterization.  She is in agreement.  She is still living at home.  She is not demented.  She is quite active for her age.  ROS:  All other ROS reviewed and negative. Pertinent positives noted in the HPI.     Inpatient Medications  Scheduled Meds:  vitamin C  500 mg Oral Daily   aspirin  81 mg Oral Daily   cholecalciferol  5,000 Units Oral Daily   famotidine  20 mg Oral Daily   furosemide  40 mg Oral BID   insulin aspart  0-5 Units Subcutaneous QHS   insulin aspart  0-6 Units Subcutaneous TID WC   isosorbide mononitrate  30 mg Oral Daily   levothyroxine  88 mcg Oral QAC breakfast   metoprolol succinate  50 mg Oral Daily   morphine  1 mg Intravenous Once   multivitamin with minerals  1 tablet Oral Daily   ondansetron  4 mg Intravenous Once   polyethylene glycol  17 g Oral BID   potassium chloride  40 mEq Oral Once   potassium chloride SA  20 mEq Oral QPC supper   rosuvastatin  5 mg Oral Daily   senna-docusate  2 tablet Oral QHS   sertraline  50 mg Oral QHS   sodium chloride flush  3 mL Intravenous Q12H   sodium chloride flush  3 mL Intravenous Q12H   spironolactone  12.5 mg Oral Daily   tamsulosin  0.4 mg Oral QPC supper   vitamin B-12  2,500 mcg Oral Daily   Vitamin E  400 Units Oral Daily   Continuous Infusions:  sodium chloride     heparin 850 Units/hr (11/07/20 0019)   PRN Meds: sodium chloride, acetaminophen **OR** acetaminophen, bisacodyl, fentaNYL (SUBLIMAZE) injection, ondansetron **OR** ondansetron (ZOFRAN) IV, polyethylene glycol, polyvinyl alcohol, sodium chloride flush, traZODone   Vital Signs   Vitals:   11/07/20 0013  11/07/20 0403 11/07/20 0500 11/07/20 0721  BP: (!) 96/59 (!) 88/48  (!) 87/54  Pulse: 83 75  70  Resp: 20 17  17   Temp: (!) 97.5 F (36.4 C) 97.6 F (36.4 C)  97.7 F (36.5 C)  TempSrc: Oral Oral  Oral  SpO2: 97% 95%  95%  Weight: 59.6 kg  59.6 kg   Height:        Intake/Output Summary (Last 24 hours) at 11/07/2020 0753 Last data filed at 11/07/2020 0735 Gross per 24 hour  Intake 706.16 ml  Output 1750 ml  Net -1043.84 ml   Last 3 Weights 11/07/2020 11/07/2020 11/06/2020  Weight (lbs) 131 lb 4.8 oz 131 lb 4.8 oz 129 lb 14.4 oz  Weight (kg) 59.557 kg 59.557 kg 58.922 kg      Telemetry  Overnight telemetry shows sinus rhythm in the 80s, which I personally reviewed.   ECG  The most recent ECG shows sinus rhythm, left bundle branch block, which I personally reviewed.   Physical Exam   Vitals:   11/07/20 0013 11/07/20 0403 11/07/20 0500 11/07/20 0721  BP: (!) 96/59 (!) 88/48  (!) 87/54  Pulse: 83 75  70  Resp: 20 17  17   Temp: (!) 97.5 F (36.4 C) 97.6 F (36.4 C)  97.7 F (36.5 C)  TempSrc: Oral Oral  Oral  SpO2: 97% 95%  95%  Weight: 59.6 kg  59.6 kg   Height:        Intake/Output Summary (Last 24 hours) at 11/07/2020 0753 Last data filed at 11/07/2020 0735 Gross per 24 hour  Intake 706.16 ml  Output 1750 ml  Net -1043.84 ml    Last 3 Weights 11/07/2020 11/07/2020 11/06/2020  Weight (lbs) 131 lb 4.8 oz 131 lb 4.8 oz 129 lb 14.4 oz  Weight (kg) 59.557 kg 59.557 kg 58.922 kg    Body mass index is 22.54 kg/m.  General: Well nourished, well developed, in no acute distress Head: Atraumatic, normal size  Eyes: PEERLA, EOMI  Neck: Supple, no JVD Endocrine: No thryomegaly Cardiac: Normal S1, S2; RRR; no murmurs, rubs, or gallops Lungs: Clear to auscultation bilaterally, no wheezing, rhonchi or rales  Abd: Soft, nontender, no hepatomegaly  Ext: No edema, pulses 2+ Musculoskeletal: No deformities, BUE and BLE strength normal and equal Skin: Warm and dry, no rashes    Neuro: Alert and oriented to person, place, time, and situation, CNII-XII grossly intact, no focal deficits  Psych: Normal mood and affect   Labs  High Sensitivity Troponin:   Recent Labs  Lab 11/06/20 0851 11/06/20 1039 11/06/20 1803 11/07/20 0540  TROPONINIHS 13 34* 42* 25*     Cardiac EnzymesNo results for input(s): TROPONINI in the last 168 hours. No results for input(s): TROPIPOC in the last 168 hours.  Chemistry Recent Labs  Lab 11/06/20 0851  NA 135  K 3.3*  CL 96*  CO2 30  GLUCOSE 134*  BUN 40*  CREATININE 1.02*  CALCIUM 8.9  GFRNONAA 52*  ANIONGAP 9    Hematology Recent Labs  Lab 11/06/20 0851  WBC 4.4  RBC 3.23*  HGB 10.5*  HCT 31.5*  MCV 97.5  MCH 32.5  MCHC 33.3  RDW 12.3  PLT 188   BNPNo results for input(s): BNP, PROBNP in the last 168 hours.  DDimer No results for input(s): DDIMER in the last 168 hours.   Radiology  DG Chest Port 1 View  Result Date: 11/06/2020 CLINICAL DATA:  87 year old female with history of substernal chest pain. EXAM: PORTABLE CHEST 1 VIEW COMPARISON:  Chest x-ray 05/07/2014. FINDINGS: Lung volumes are normal. No consolidative airspace disease. No pleural effusions. No pneumothorax. No pulmonary nodule or mass noted. Pulmonary vasculature and the cardiomediastinal silhouette are within normal limits. Atherosclerosis in the thoracic aorta. Status post median sternotomy for CABG. IMPRESSION: 1. No radiographic evidence of acute cardiopulmonary disease. 2. Aortic atherosclerosis. Electronically Signed   By: Trudie Reed M.D.   On: 11/06/2020 08:59    Cardiac Studies  TTE 06/07/2019  1. Left ventricular ejection fraction, by estimation, is 35 to 40%. The  left ventricle has moderately decreased function. The left ventrical  demonstrates regional wall motion abnormalities (see scoring  diagram/findings for description). There is mildly  increased asymmetric left ventricular hypertrophy of the posterior  segment. Left  ventricular diastolic parameters are consistent with Grade  II diastolic dysfunction (pseudonormalization). Elevated left ventricular  end-diastolic pressure.   2. Right ventricular systolic function is mildly reduced. The right  ventricular size is moderately enlarged. There is mildly elevated  pulmonary artery systolic pressure.   3. Left atrial size was moderately dilated.   4. Right atrial size was mildly dilated.   5. The mitral valve is degenerative. Moderate mitral valve regurgitation.   6. Tricuspid valve regurgitation is mild to  moderate.   7. The aortic valve has been repaired/replaced. Aortic valve  regurgitation is not visualized. No aortic stenosis is present. 23mm  [place] bioprosthetic valve valve is present in the aortic position. Echo  findings show normal structure and function of  the aortic prosthesis.   8. The inferior vena cava is normal in size with greater than 50%  respiratory variability, suggesting right atrial pressure of 3 mmHg.  LHC 11/06/2018  Severe three-vessel coronary artery disease, including 90% proximal/mid LAD disease, sequential 95% and 50% proximal/mid LCx lesions, long 80% OM1 stenosis, and severe diffuse disease involving non-dominant RCA. Widely patent LIMA-LAD. Chronically occluded SVG-LCx (most likely grafted to OM1). Successful PCI to 95% proximal LCx stenosis using Resolute Onyx 3.5 x 12 mm drug-eluting stent with 0% residual stenosis and TIMI-3 flow.   Recommendations: Dual antiplatelet therapy with aspirin and ticagrelor for at least 12 months.  Discontinuation of aspirin after 3 months could be considered to reduce bleeding risk. Obtain limited echo to evaluate LVEF in the setting of NSTEMI. Aggressive secondary prevention. Medical therapy for OM1 disease.  If the patient has refractory angina despite maximal tolerated antianginal therapy, PCI could be considered. Gentle post-catheterization hydration given chronic kidney  disease.   Patient Profile  Erin Herman is a 87 y.o. female with CAD status post CABG (recent PCI to proximal circumflex 10/2018), AVR, ischemic or myopathy (EF 35-40%), left bundle branch block, CKD, hypertension who was admitted on 11/06/2020 with unstable angina from [facility].  Assessment & Plan   Unstable angina/non-STEMI/three-vessel CAD status post CABG -Admitted with chest pressure that woke her up from sleep.  Did not tolerate nitroglycerin likely related to her cardiomyopathy.  Continues to have 2 out of 10 chest pressure.  Resting comfortably.  Troponin peak 42 and trending down.  EKG is without any change from prior.  She has a chronic left bundle branch block. -She appears euvolemic to me.  We will hold further diuresis. -Repeat echocardiogram is pending. -She is on aspirin, heparin drip and is on a statin.  She is on a beta-blocker. -I did review her left heart catheterization from 2020.  LIMA to LAD is patent.  Vein graft to circumflex is occluded.  She underwent PCI to a proximal 95% circumflex lesion.  She did have residual disease in the first obtuse marginal branch. -It was recommended she be considered for PCI to this if symptoms persisted.  She now presents with unstable chest pain symptoms. -She is active and stable at home.  No memory impairment.  I have recommended left heart catheterization today.  She is in agreement.  She understands the risk and benefits.  See discussion of risk and benefits below.  She is alert and oriented x4.  She is a very functional 87-year-old.  She still lives at home. -Kidney function is stable. -LDL not quite at goal.  Increase Crestor to 10 mg daily.  2.  Ischemic cardiomyopathy, EF 35 to 40% -Chest x-ray clear.  No evidence of volume overload. -We will get a better idea of volume status on LVEDP at the time of left heart cath. -Continue metoprolol succinate.  Continue Aldactone.  Restart losartan after left heart  cath. -Repeat echocardiogram is pending.  Shared Decision Making/Informed Consent The risks [stroke (1 in 1000), death (1 in 1000), kidney failure [usually temporary] (1 in 500), bleeding (1 in 200), allergic reaction [possibly serious] (1 in 200)], benefits (diagnostic support and management of coronary artery disease) and alternatives of a cardiac   catheterization were discussed in detail with Erin Herman and she is willing to proceed.   For questions or updates, please contact [facility] Please consult [email or web address] for contact info under   Time Spent with Patient: I have spent a total of 35 minutes with patient reviewing hospital notes, telemetry, EKGs, labs and examining the patient as well as establishing an assessment and plan that was discussed with the patient.  > 50% of time was spent in direct patient care.    Signed, Lenna Gilford. Flora Lipps, MD, [facility] [place]  [facility]  11/07/2020 7:53 AM

## 2020-11-07 NOTE — Progress Notes (Addendum)
PROGRESS NOTE    Erin Herman  OMV:[id number] DOB: Feb 15, 1928 DOA: 11/06/2020 PCP: Kirstie Peri, MD    Brief Narrative:  Mrs. Erin Herman was admitted to the hospital with the working diagnosis of atypical chest pain in the setting of severe coronary artery disease.   87 year old female past medical history for coronary artery disease, bioprosthetic arctic valve replacement 2006, chronic kidney disease, hypertension and GERD who presented with acute onset of chest pressure that prompted her to call EMS.  At the time she reached the ED she had persistent chest pain, her blood pressure was 134/66, heart rate 73, temperature 97.8, respiratory rate 14, oxygen saturation 97%, her lungs were clear to auscultation bilaterally, heart S1-S2, present, rhythmic, soft abdomen, no lower extremity edema.  Sodium 135, potassium 3.3, chloride 96, bicarb 30, glucose 134, BUN 40, creatinine 1.0, high sensitive troponin 13-24-42-25, white count 4.4, hemoglobin 10.5, hematocrit 31.5, platelets 188. SARS COVID-19 negative.  Chest radiograph with increased lung markings bilaterally, reticular.  EKG 73 bpm, rightward axis, QTC 500, left bundle branch block, sinus rhythm, poor R wave progression, no significant ST segment or T wave changes.  Patient was placed on heparin drip and underwent cardiac catheterization with coronary angiography.  Severe triple vessel CAD sp CABG with 1.2 patient bypass grafts.   Recommendations to continue aggressive medical therapy, not candidate for PCI.   Assessment & Plan:   Principal Problem:   Chest pain Active Problems:   Essential hypertension, benign   CAD, NATIVE VESSEL   Secondary cardiomyopathy (HCC)   Aortic valve disorder   Chronic diastolic heart failure (HCC)   Diabetes (HCC)   Acute chest pain in the setting of severe CAD/ bioprosthetic aortic valve. Patient continue to have chest pressure, less intensity but not back to baseline. Her coronary  angiography showed severe triple vessel CAD sp CABG.   Plan to continue aggressive medical therapy with aspirin, isosorbide, metoprolol and rosuvastatin. Follow up on echocardiogram.  Consult PT and OT, out of bed to chair tid meals.   2. HTN. Blood pressure 99 to 114 mmHg systolic, continue to hold on antihypertensive medications.   3. T2DM. Fasting glucose is 138, will continue glucose cover and monitoring.   4. Hypothyroidism. Continue with levothyroxine   5. Hypokalemia. K is 4,0 with preserved renal function with serum cr at 1,0 with bicarbonate at 27. Continue close follow up of renal function   6. Chronic anemia cell count stable with Hgb at 10,8 and Hct at 31,5   Patient continue to be at high risk for recurrent chest pain   Status is: Observation  The patient remains OBS appropriate and will d/c before 2 midnights.  Dispo: The patient is from: Home              Anticipated d/c is to: Home              Patient currently is not medically stable to d/c.   Difficult to place patient No   DVT prophylaxis: Enoxaparin   Code Status:   full  Family Communication:  No family at the bedside  I spoke over the phone with the patient's daughter about patient's  condition, plan of care, prognosis and all questions were addressed.    Consultants:  Cardiology   Procedures:  Left heart cath   Subjective: Patient continue to have chest tightness, decreased in intensity but not yet back to baseline, no nausea or vomiting, no dyspnea or lower extremity edema.   Objective: Vitals:  11/07/20 1202 11/07/20 1207 11/07/20 1212 11/07/20 1247  BP: (!) 108/56 (!) 110/57 114/60 (!) 99/59  Pulse: 66 71 67 65  Resp: 11 18 15 18   Temp:    98 F (36.7 C)  TempSrc:    Oral  SpO2: 100% 100% (!) 0% 100%  Weight:      Height:        Intake/Output Summary (Last 24 hours) at 11/07/2020 1323 Last data filed at 11/07/2020 0735 Gross per 24 hour  Intake 706.16 ml  Output 1250 ml  Net  -543.84 ml   Filed Weights   11/06/20 1716 11/07/20 0013 11/07/20 0500  Weight: 58.9 kg 59.6 kg 59.6 kg    Examination:   General: Not in pain or dyspnea  Neurology: Awake and alert, non focal  E ENT: no pallor, no icterus, oral mucosa moist Cardiovascular: No JVD. S1-S2 present, loud S2, positive systolic murmur at the apex,, no gallops, rubs. No lower extremity edema. Pulmonary: positive breath sounds bilaterally, adequate air movement, no wheezing, rhonchi or rales. Gastrointestinal. Abdomen soft and non tender Skin. No rashes Musculoskeletal: no joint deformities     Data Reviewed: I have personally reviewed following labs and imaging studies  CBC: Recent Labs  Lab 11/06/20 0851 11/07/20 0540  WBC 4.4 6.3  NEUTROABS 2.8  --   HGB 10.5* 10.8*  HCT 31.5* 31.5*  MCV 97.5 94.6  PLT 188 205   Basic Metabolic Panel: Recent Labs  Lab 11/06/20 0851 11/06/20 1803 11/07/20 0540  NA 135  --  134*  K 3.3*  --  4.0  CL 96*  --  97*  CO2 30  --  27  GLUCOSE 134*  --  138*  BUN 40*  --  31*  CREATININE 1.02*  --  1.02*  CALCIUM 8.9  --  9.6  MG  --  1.8  --    GFR: Estimated Creatinine Clearance: 31 mL/min (A) (by C-G formula based on SCr of 1.02 mg/dL (H)). Liver Function Tests: No results for input(s): AST, ALT, ALKPHOS, BILITOT, PROT, ALBUMIN in the last 168 hours. Recent Labs  Lab 11/07/20 0540  LIPASE 34   No results for input(s): AMMONIA in the last 168 hours. Coagulation Profile: No results for input(s): INR, PROTIME in the last 168 hours. Cardiac Enzymes: No results for input(s): CKTOTAL, CKMB, CKMBINDEX, TROPONINI in the last 168 hours. BNP (last 3 results) No results for input(s): PROBNP in the last 8760 hours. HbA1C: Recent Labs    11/06/20 0851  HGBA1C 7.0*   CBG: Recent Labs  Lab 11/06/20 1717 11/06/20 2127 11/07/20 0622 11/07/20 1254  GLUCAP 114* 127* 168* 98   Lipid Profile: Recent Labs    11/07/20 0540  CHOL 134  HDL 37*   LDLCALC 78  TRIG 93  CHOLHDL 3.6   Thyroid Function Tests: No results for input(s): TSH, T4TOTAL, FREET4, T3FREE, THYROIDAB in the last 72 hours. Anemia Panel: No results for input(s): VITAMINB12, FOLATE, FERRITIN, TIBC, IRON, RETICCTPCT in the last 72 hours.    Radiology Studies: I have reviewed all of the imaging during this hospital visit personally     Scheduled Meds:  vitamin C  500 mg Oral Daily   aspirin  81 mg Oral Daily   cholecalciferol  5,000 Units Oral Daily   famotidine  20 mg Oral Daily   insulin aspart  0-5 Units Subcutaneous QHS   insulin aspart  0-6 Units Subcutaneous TID WC   levothyroxine  88 mcg Oral QAC  breakfast   metoprolol succinate  50 mg Oral Daily   morphine  1 mg Intravenous Once   multivitamin with minerals  1 tablet Oral Daily   ondansetron  4 mg Intravenous Once   polyethylene glycol  17 g Oral BID   rosuvastatin  10 mg Oral Daily   senna-docusate  2 tablet Oral QHS   sertraline  50 mg Oral QHS   sodium chloride flush  3 mL Intravenous Q12H   sodium chloride flush  3 mL Intravenous Q12H   sodium chloride flush  3 mL Intravenous Q12H   spironolactone  12.5 mg Oral Daily   tamsulosin  0.4 mg Oral QPC supper   vitamin B-12  2,500 mcg Oral Daily   Vitamin E  400 Units Oral Daily   Continuous Infusions:  sodium chloride     sodium chloride     sodium chloride       LOS: 0 days        Sheron Tallman Annett Gula, MD

## 2020-11-07 NOTE — Progress Notes (Signed)
ANTICOAGULATION CONSULT NOTE - Follow Up Consult  Pharmacy Consult for Heparin Indication: chest pain/ACS  Allergies  Allergen Reactions   Lipitor [Atorvastatin]     MUSCLE ACHES/FATIGUE   Morphine Nausea And Vomiting   Penicillins Swelling and Other (See Comments)    Tolerated Cephalosporin Date: 04/29/20.  Severe arm swelling and redness Did it involve swelling of the face/tongue/throat, SOB, or low BP? No Did it involve sudden or severe rash/hives, skin peeling, or any reaction on the inside of your mouth or nose? No Did you need to seek medical attention at a hospital or doctor's office? No When did it last happen?      30 years If all above answers are "NO", may proceed with cephalosporin use.   Reclast [Zoledronic Acid]     "nausea and vomiting. Could not get out of bed.    Patient Measurements: Height: 5\' 4"  (162.6 cm) Weight: 59.6 kg (131 lb 4.8 oz) IBW/kg (Calculated) : 54.7 Heparin Dosing Weight: 59.6 kg  Vital Signs: Temp: 97.7 F (36.5 C) (07/15 0721) Temp Source: Oral (07/15 0721) BP: 87/54 (07/15 0721) Pulse Rate: 70 (07/15 0721)  Labs: Recent Labs    11/06/20 0851 11/06/20 1039 11/06/20 1803 11/06/20 2041 11/07/20 0540  HGB 10.5*  --   --   --   --   HCT 31.5*  --   --   --   --   PLT 188  --   --   --   --   HEPARINUNFRC  --   --   --  <0.10* 0.22*  CREATININE 1.02*  --   --   --   --   TROPONINIHS 13 34* 42*  --  25*    Estimated Creatinine Clearance: 31 mL/min (A) (by C-G formula based on SCr of 1.02 mg/dL (H)).  Assessment:  87 yr old female admitted with chest pain. Pharmacy consulted to dose heparin in patient with chest pain/ACS.  Patient is not on anticoagulation prior to admission.   Heparin level is subtherapeutic (0.22) on 850 units/hr. Plan cardiac cath today.  Goal of Therapy:  Heparin level 0.3-0.7 units/ml Monitor platelets by anticoagulation protocol: Yes   Plan:   Increase heparin drip to 1000 units/hr.  Follow up  post-cath  If plans change, will recheck heparin level later today.  Daily heparin level and CBC while on heparin.  82, RPh 11/07/2020,8:12 AM

## 2020-11-07 NOTE — Interval H&P Note (Signed)
History and Physical Interval Note:  11/07/2020 11:36 AM  Erin Herman  has presented today for surgery, with the diagnosis of chest pain.  The various methods of treatment have been discussed with the patient and family. After consideration of risks, benefits and other options for treatment, the patient has consented to  Procedure(s): LEFT HEART CATH AND CORS/GRAFTS ANGIOGRAPHY (N/A) as a surgical intervention.  The patient's history has been reviewed, patient examined, no change in status, stable for surgery.  I have reviewed the patient's chart and labs.  Questions were answered to the patient's satisfaction.    Cath Lab Visit (complete for each Cath Lab visit)  Clinical Evaluation Leading to the Procedure:   ACS: Yes.    Non-ACS:    Anginal Classification: CCS III  Anti-ischemic medical therapy: Minimal Therapy (1 class of medications)  Non-Invasive Test Results: No non-invasive testing performed  Prior CABG: Previous CABG        Verne Carrow

## 2020-11-07 NOTE — Evaluation (Signed)
Physical Therapy Evaluation Patient Details Name: Erin Herman MRN: [id number] DOB: 06/13/1927 Today's Date: 11/07/2020   History of Present Illness  The pt is a 87 yo female presenting 7/14 with c/o substernal chest pain that did not diminish with NG. Work up reveals acute unstable angina in setting of severe CAD with triple vessel CAD and ischemic cardiomyopathy (EF 35-40%). S/p L heart cath 7/15. PMH includes: HTN, CHF, and DM II, hypothyroidism, hypokalemia, and anemia.   Clinical Impression  Pt in bed upon arrival of PT, agreeable to evaluation at this time. Prior to admission the pt was mobilizing with use of quad cane for most mobility, but with some use of rollator for longer distances, living in a home with 3 steps to enter with her 72 yo husband, and enjoys working out in the garden. The pt now presents with minor limitations in functional mobility, strength, power, and stability due to above dx, and will continue to benefit from skilled PT to address these deficits. The pt was able to demo good independence with bed mobility, but requires minG to minA to complete power up from sitting and to steady with hallway ambulation. The pt currently requires BUE support for improved stability with ambulation, is agreeable to HHPT to improve functional strength, stability, and reduce risk of falls at home.      Follow Up Recommendations Home health PT;Supervision for mobility/OOB    Equipment Recommendations  None recommended by PT    Recommendations for Other Services       Precautions / Restrictions Precautions Precautions: Fall Restrictions Weight Bearing Restrictions: No      Mobility  Bed Mobility Overal bed mobility: Modified Independent             General bed mobility comments: increased time, use of bed rails    Transfers Overall transfer level: Needs assistance Equipment used: Rolling walker (2 wheeled);1 person hand held assist Transfers: Sit to/from  Stand Sit to Stand: Min guard;Min assist         General transfer comment: minG with RW, minA with HHA  Ambulation/Gait Ambulation/Gait assistance: Min guard;Min assist Gait Distance (Feet): 200 Feet Assistive device: Rolling walker (2 wheeled) Gait Pattern/deviations: Step-through pattern;Decreased stride length;Trunk flexed Gait velocity: 0.33 m/s Gait velocity interpretation: <1.31 ft/sec, indicative of household ambulator General Gait Details: pt with short strides, trunk flexed, but no overt LOB and good management of RW. reliant on at least single UE support, miNA with single HHA, minG with RW        Balance Overall balance assessment: Mild deficits observed, not formally tested                                           Pertinent Vitals/Pain Pain Assessment: No/denies pain    Home Living Family/patient expects to be discharged to:: Private residence Living Arrangements: Spouse/significant other Available Help at Discharge: Family;Available 24 hours/day Type of Home: House Home Access: Stairs to enter Entrance Stairs-Rails: Can reach both Entrance Stairs-Number of Steps: 3 Home Layout: One level Home Equipment: Walker - 2 wheels;Cane - single point;Bedside commode;Shower seat;Grab bars - tub/shower;Grab bars - toilet;Walker - 4 wheels;Cane - quad Additional Comments: pt lives with her 67 y.o. husband and her daughter is able to help while she recovers.    Prior Function Level of Independence: Independent with assistive device(s)         Comments:  pt reports using quad cane for most mobility, but will take rolaltor for longer distances. daughter will bring meals often.     Hand Dominance   Dominant Hand: Right    Extremity/Trunk Assessment   Upper Extremity Assessment Upper Extremity Assessment: Generalized weakness    Lower Extremity Assessment Lower Extremity Assessment: Generalized weakness    Cervical / Trunk  Assessment Cervical / Trunk Assessment: Kyphotic  Communication   Communication: No difficulties  Cognition Arousal/Alertness: Awake/alert Behavior During Therapy: WFL for tasks assessed/performed Overall Cognitive Status: Within Functional Limits for tasks assessed                                        General Comments General comments (skin integrity, edema, etc.): VSS on RA. HR from 70s to 90s with hallway ambulation    Exercises     Assessment/Plan    PT Assessment Patient needs continued PT services  PT Problem List Decreased strength;Decreased activity tolerance;Decreased balance;Decreased mobility       PT Treatment Interventions DME instruction;Gait training;Stair training;Functional mobility training;Therapeutic activities;Balance training;Therapeutic exercise;Patient/family education    PT Goals (Current goals can be found in the Care Plan section)  Acute Rehab PT Goals Patient Stated Goal: return home PT Goal Formulation: With patient Time For Goal Achievement: 11/21/20 Potential to Achieve Goals: Good    Frequency Min 3X/week    AM-PAC PT "6 Clicks" Mobility  Outcome Measure Help needed turning from your back to your side while in a flat bed without using bedrails?: None Help needed moving from lying on your back to sitting on the side of a flat bed without using bedrails?: A Little Help needed moving to and from a bed to a chair (including a wheelchair)?: A Little Help needed standing up from a chair using your arms (e.g., wheelchair or bedside chair)?: A Little Help needed to walk in hospital room?: A Little Help needed climbing 3-5 steps with a railing? : A Little 6 Click Score: 19    End of Session Equipment Utilized During Treatment: Gait belt Activity Tolerance: Patient tolerated treatment well Patient left: in bed;with call bell/phone within reach (sitting EOB) Nurse Communication: Mobility status PT Visit Diagnosis: Other  abnormalities of gait and mobility (R26.89);Muscle weakness (generalized) (M62.81)    Time: 4098-1191 PT Time Calculation (min) (ACUTE ONLY): 23 min   Charges:   PT Evaluation $PT Eval Low Complexity: 1 Low PT Treatments $Gait Training: 8-22 mins        Lazarus Gowda, PT, DPT   Acute Rehabilitation Department Pager #: [phone]  Gaetana Michaelis 11/07/2020, 5:47 PM

## 2020-11-08 DIAGNOSIS — I5032 Chronic diastolic (congestive) heart failure: Secondary | ICD-10-CM | POA: Diagnosis not present

## 2020-11-08 DIAGNOSIS — I2510 Atherosclerotic heart disease of native coronary artery without angina pectoris: Secondary | ICD-10-CM | POA: Diagnosis not present

## 2020-11-08 DIAGNOSIS — I200 Unstable angina: Secondary | ICD-10-CM

## 2020-11-08 DIAGNOSIS — I10 Essential (primary) hypertension: Secondary | ICD-10-CM | POA: Diagnosis not present

## 2020-11-08 LAB — GLUCOSE, CAPILLARY
Glucose-Capillary: 141 mg/dL — ABNORMAL HIGH (ref 70–99)
Glucose-Capillary: 186 mg/dL — ABNORMAL HIGH (ref 70–99)

## 2020-11-08 LAB — BASIC METABOLIC PANEL
Anion gap: 6 (ref 5–15)
BUN: 23 mg/dL (ref 8–23)
CO2: 28 mmol/L (ref 22–32)
Calcium: 9.2 mg/dL (ref 8.9–10.3)
Chloride: 102 mmol/L (ref 98–111)
Creatinine, Ser: 1.06 mg/dL — ABNORMAL HIGH (ref 0.44–1.00)
GFR, Estimated: 50 mL/min — ABNORMAL LOW (ref >60)
Glucose, Bld: 135 mg/dL — ABNORMAL HIGH (ref 70–99)
Potassium: 4.2 mmol/L (ref 3.5–5.1)
Sodium: 136 mmol/L (ref 135–145)

## 2020-11-08 MED ORDER — ISOSORBIDE MONONITRATE ER 30 MG PO TB24: 15.0000 mg | ORAL_TABLET | Freq: Every day | ORAL | 0 refills | Status: DC

## 2020-11-08 MED ORDER — LOSARTAN POTASSIUM 25 MG PO TABS
12.5000 mg | ORAL_TABLET | Freq: Every day | ORAL | Status: DC
  Administered 2020-11-08: 12.5 mg via ORAL
  Filled 2020-11-08: qty 1

## 2020-11-08 NOTE — TOC Transition Note (Signed)
Transition of Care [facility]) - CM/SW Discharge Note   Patient Details  Name: Kelise Kuch MRN: [id number] Date of Birth: 1927-07-11  [facility] CM/SW Contact:  Kallie Locks, RN Phone Number: [phone] 11/08/2020, 10:43 AM   Clinical Narrative:   Spoke with patient about home health. Initially states she does not need home health. However, she asked writer to contact daughter Santiago Glad at [phone] to discuss further.   [name] is agreeable to home health services on her mother's behalf. States patient has all DME at home. Mrs. Lucci lives with spouse.  No home health preference. [facility] contacted and able to accept referral.   No further needs assessed.    Final next level of care: Home w Home Health Services Barriers to Discharge: No Barriers Identified   Patient Goals and CMS Choice Patient states their goals for this hospitalization and ongoing recovery are:: return home CMS Medicare.gov Compare Post Acute Care list provided to:: Other (Comment Required) (daughter- Santiago Glad) Choice offered to / list presented to : Adult Children (daughter)  Discharge Placement                       Discharge Plan and Services                  DME Agency: [facility]       HH Arranged: PT, OT [facility] Agency: [facility] Date [facility] Agency Contacted: 11/08/20 Time HH Agency Contacted: [phone] Representative spoke with at [facility] Agency: Denyse Amass  Social Determinants of Health (SDOH) Interventions     Readmission Risk Interventions No flowsheet data found.

## 2020-11-08 NOTE — Progress Notes (Signed)
Cardiology Progress Note  Patient ID: Erin Herman MRN: [id number] DOB: 07/06/1927 Date of Encounter: 11/08/2020  Primary Cardiologist: Nona Dell, MD  Subjective   Chief Complaint: None.  HPI: Left radial cath site clean and dry.  Good pulse.  Denies further chest pain episodes.  Telemetry unremarkable.  ROS:  All other ROS reviewed and negative. Pertinent positives noted in the HPI.     Inpatient Medications  Scheduled Meds:  vitamin C  500 mg Oral Daily   aspirin  81 mg Oral Daily   cholecalciferol  5,000 Units Oral Daily   insulin aspart  0-5 Units Subcutaneous QHS   insulin aspart  0-6 Units Subcutaneous TID WC   isosorbide mononitrate  15 mg Oral Daily   levothyroxine  88 mcg Oral QAC breakfast   losartan  12.5 mg Oral Daily   metoprolol succinate  50 mg Oral Daily   multivitamin with minerals  1 tablet Oral Daily   ondansetron  4 mg Intravenous Once   pantoprazole  40 mg Oral Daily   polyethylene glycol  17 g Oral BID   rosuvastatin  10 mg Oral Daily   senna-docusate  2 tablet Oral QHS   sertraline  50 mg Oral QHS   sodium chloride flush  3 mL Intravenous Q12H   sodium chloride flush  3 mL Intravenous Q12H   sodium chloride flush  3 mL Intravenous Q12H   spironolactone  12.5 mg Oral Daily   tamsulosin  0.4 mg Oral QPC supper   vitamin B-12  2,500 mcg Oral Daily   Vitamin E  400 Units Oral Daily   Continuous Infusions:  sodium chloride     sodium chloride     PRN Meds: sodium chloride, sodium chloride, acetaminophen **OR** acetaminophen, bisacodyl, fentaNYL (SUBLIMAZE) injection, ondansetron **OR** ondansetron (ZOFRAN) IV, polyethylene glycol, polyvinyl alcohol, sodium chloride flush, sodium chloride flush, traZODone   Vital Signs   Vitals:   11/07/20 1642 11/07/20 1915 11/08/20 0330 11/08/20 0755  BP: 132/67 124/73 125/71 131/71  Pulse: 72 70 72 70  Resp: Temp: 97.9 F (36.6 C) 97.8 F (36.6 C) 97.9 F (36.6 C) 98.4 F (36.9 C)   TempSrc: Oral Oral Oral Oral  SpO2: 99% 98% 93% 100%  Weight:   59.6 kg   Height:        Intake/Output Summary (Last 24 hours) at 11/08/2020 0810 Last data filed at 11/08/2020 0600 Gross per 24 hour  Intake 1179.9 ml  Output 1400 ml  Net -220.1 ml   Last 3 Weights 11/08/2020 11/07/2020 11/07/2020  Weight (lbs) 131 lb 6.4 oz 131 lb 4.8 oz 131 lb 4.8 oz  Weight (kg) 59.603 kg 59.557 kg 59.557 kg      Telemetry  Overnight telemetry shows sinus rhythm in the 70s, which I personally reviewed.   Physical Exam   Vitals:   11/07/20 1642 11/07/20 1915 11/08/20 0330 11/08/20 0755  BP: 132/67 124/73 125/71 131/71  Pulse: 72 70 72 70  Resp: Temp: 97.9 F (36.6 C) 97.8 F (36.6 C) 97.9 F (36.6 C) 98.4 F (36.9 C)  TempSrc: Oral Oral Oral Oral  SpO2: 99% 98% 93% 100%  Weight:   59.6 kg   Height:        Intake/Output Summary (Last 24 hours) at 11/08/2020 0810 Last data filed at 11/08/2020 0600 Gross per 24 hour  Intake 1179.9 ml  Output 1400 ml  Net -220.1 ml    Last  3 Weights 11/08/2020 11/07/2020 11/07/2020  Weight (lbs) 131 lb 6.4 oz 131 lb 4.8 oz 131 lb 4.8 oz  Weight (kg) 59.603 kg 59.557 kg 59.557 kg    Body mass index is 22.55 kg/m.   General: Well nourished, well developed, in no acute distress Head: Atraumatic, normal size  Eyes: PEERLA, EOMI  Neck: Supple, no JVD Endocrine: No thryomegaly Cardiac: Normal S1, S2; RRR; no murmurs, rubs, or gallops Lungs: Clear to auscultation bilaterally, no wheezing, rhonchi or rales  Abd: Soft, nontender, no hepatomegaly  Ext: No edema, pulses 2+, left radial cath site clean and dry, good pulses Musculoskeletal: No deformities, BUE and BLE strength normal and equal Skin: Warm and dry, no rashes   Neuro: Alert and oriented to person, place, time, and situation, CNII-XII grossly intact, no focal deficits  Psych: Normal mood and affect   Labs  High Sensitivity Troponin:   Recent Labs  Lab 11/06/20 0851 11/06/20 1039  11/06/20 1803 11/07/20 0540  TROPONINIHS 13 34* 42* 25*     Cardiac EnzymesNo results for input(s): TROPONINI in the last 168 hours. No results for input(s): TROPIPOC in the last 168 hours.  Chemistry Recent Labs  Lab 11/06/20 0851 11/07/20 0540 11/08/20 0640  NA 135 134* 136  K 3.3* 4.0 4.2  CL 96* 97* 102  CO2 30 27 28   GLUCOSE 134* 138* 135*  BUN 40* 31* 23  CREATININE 1.02* 1.02* 1.06*  CALCIUM 8.9 9.6 9.2  GFRNONAA 52* 52* 50*  ANIONGAP 9 10 6     Hematology Recent Labs  Lab 11/06/20 0851 11/07/20 0540  WBC 4.4 6.3  RBC 3.23* 3.33*  HGB 10.5* 10.8*  HCT 31.5* 31.5*  MCV 97.5 94.6  MCH 32.5 32.4  MCHC 33.3 34.3  RDW 12.3 12.4  PLT 188 205   BNPNo results for input(s): BNP, PROBNP in the last 168 hours.  DDimer No results for input(s): DDIMER in the last 168 hours.   Radiology  CARDIAC CATHETERIZATION  Result Date: 11/07/2020 Formatting of this result is different from the original.   Mid LM lesion is 30% stenosed.   Ost LAD lesion is 25% stenosed.   Prox Cx to Mid Cx lesion is 50% stenosed.   Mid RCA lesion is 100% stenosed.   Origin to Prox Graft lesion is 100% stenosed.   LPAV lesion is 40% stenosed.   1st Mrg lesion is 95% stenosed.   Prox LAD to Mid LAD lesion is 99% stenosed.   Previously placed Ost Cx to Prox Cx stent (unknown type) is  widely patent.   SVG graft was not injected.   LIMA and is normal in caliber.   The graft exhibits no disease. Severe triple vessel CAD s/p CABG with 1/2 patent bypass grafts Chronic sub-total occlusion of the mid LAD. The mid and distal LAD fills from the patent LIMA graft Patent proximal Circumflex stent. Beyond the stent there is a moderate stenosis in the AV groove Circumflex stent. The moderate caliber obtuse marginal branch has a long segment of severe stenosis extending from the ostium into the mid vessel. This has been present since her last cath. The vessel had been bypassed. The vein graft to the obtuse marginal branch is  known to be occluded. The obtuse marginal branch is not a favorable target for PCI. The RCA is a small non-dominant vessel. The vessel is chronically occluded in the mid segment. The distal vessel fills from right to right collaterals. Recommendations: No good options for PCI. I  would not approach the OM branch with PCI as this would likely compromise flow into the AV groove Circumflex which supplies a dominant lateral wall system. Continue medical management of CAD.   DG Chest Port 1 View  Result Date: 11/06/2020 CLINICAL DATA:  87 year old female with history of substernal chest pain. EXAM: PORTABLE CHEST 1 VIEW COMPARISON:  Chest x-ray 05/07/2014. FINDINGS: Lung volumes are normal. No consolidative airspace disease. No pleural effusions. No pneumothorax. No pulmonary nodule or mass noted. Pulmonary vasculature and the cardiomediastinal silhouette are within normal limits. Atherosclerosis in the thoracic aorta. Status post median sternotomy for CABG. IMPRESSION: 1. No radiographic evidence of acute cardiopulmonary disease. 2. Aortic atherosclerosis. Electronically Signed   By: Trudie Reed M.D.   On: 11/06/2020 08:59   ECHOCARDIOGRAM COMPLETE  Result Date: 11/07/2020    ECHOCARDIOGRAM REPORT   Patient Name:   DONNAMARIE SHANKLES Date of Exam: 11/07/2020 Medical Rec #:  [id number]           Height:       64.0 in Accession #:    [id number]          Weight:       131.3 lb Date of Birth:  May 27, 1927           BSA:          1.636 m Patient Age:    87 years            BP:           99/59 mmHg Patient Gender: F                   HR:           58 bpm. Exam Location:  Inpatient Procedure: 2D Echo, Cardiac Doppler and Color Doppler Indications:    Abnormal EKG  History:        Patient has prior history of Echocardiogram examinations, most                 recent 06/07/2019. Cardiomyopathy and CHF, CAD and Previous                 Myocardial Infarction, Signs/Symptoms:Murmur; Risk                  Factors:Hypertension, Diabetes and Dyslipidemia. 11/06/2018                 coronary stent                 Unkown date: CABG and 61mm [place] bio-AVR.  Sonographer:    Neomia Dear RDCS Referring Phys: [id number] COURAGE EMOKPAE IMPRESSIONS  1. Pronounced septal motion consistent with LBBB and postop. Left ventricular ejection fraction, by estimation, is 40 to 45%. The left ventricle has mildly decreased function. The left ventricle has no regional wall motion abnormalities. Left ventricular diastolic parameters are consistent with Grade II diastolic dysfunction (pseudonormalization).  2. Right ventricular systolic function is mildly reduced. The right ventricular size is moderately enlarged. There is normal pulmonary artery systolic pressure. The estimated right ventricular systolic pressure is 28.6 mmHg.  3. Left atrial size was severely dilated.  4. Right atrial size was mildly dilated.  5. The mitral valve is grossly normal. Mild mitral valve regurgitation. No evidence of mitral stenosis.  6. 23 mm bioprosthetic aortic valve. Vmax 2.1 m/s, MG 11 mmHG, EOA 0.89. DI 0.35. Normal prosthesis. The aortic valve has been repaired/replaced. Aortic valve regurgitation is not visualized.  7. The inferior vena  cava is normal in size with greater than 50% respiratory variability, suggesting right atrial pressure of 3 mmHg. Comparison(s): No significant change from prior study. EF relatively unchanged. FINDINGS  Left Ventricle: Pronounced septal motion consistent with LBBB and postop. Left ventricular ejection fraction, by estimation, is 40 to 45%. The left ventricle has mildly decreased function. The left ventricle has no regional wall motion abnormalities. The left ventricular internal cavity size was normal in size. There is no left ventricular hypertrophy. Abnormal (paradoxical) septal motion, consistent with left bundle branch block and abnormal (paradoxical) septal motion consistent with post-operative  status. Left  ventricular diastolic parameters are consistent with Grade II diastolic dysfunction (pseudonormalization). Right Ventricle: The right ventricular size is moderately enlarged. No increase in right ventricular wall thickness. Right ventricular systolic function is mildly reduced. There is normal pulmonary artery systolic pressure. The tricuspid regurgitant velocity is 2.53 m/s, and with an assumed right atrial pressure of 3 mmHg, the estimated right ventricular systolic pressure is 28.6 mmHg. Left Atrium: Left atrial size was severely dilated. Right Atrium: Right atrial size was mildly dilated. Pericardium: Trivial pericardial effusion is present. Mitral Valve: The mitral valve is grossly normal. Mild mitral valve regurgitation. No evidence of mitral valve stenosis. MV peak gradient, 6.1 mmHg. The mean mitral valve gradient is 3.0 mmHg. Tricuspid Valve: The tricuspid valve is grossly normal. Tricuspid valve regurgitation is trivial. No evidence of tricuspid stenosis. Aortic Valve: 23 mm bioprosthetic aortic valve. Vmax 2.1 m/s, MG 11 mmHG, EOA 0.89. DI 0.35. Normal prosthesis. The aortic valve has been repaired/replaced. Aortic valve regurgitation is not visualized. Aortic valve mean gradient measures 11.0 mmHg. Aortic valve peak gradient measures 17.8 mmHg. Aortic valve area, by VTI measures 0.89 cm. Pulmonic Valve: The pulmonic valve was grossly normal. Pulmonic valve regurgitation is trivial. No evidence of pulmonic stenosis. Aorta: The aortic root and ascending aorta are structurally normal, with no evidence of dilitation. Venous: The inferior vena cava is normal in size with greater than 50% respiratory variability, suggesting right atrial pressure of 3 mmHg. IAS/Shunts: The atrial septum is grossly normal.  LEFT VENTRICLE PLAX 2D LVIDd:         4.70 cm     Diastology LVIDs:         3.10 cm     LV e' medial:    7.29 cm/s LV PW:         1.20 cm     LV E/e' medial:  15.9 LV IVS:        1.10 cm     LV e' lateral:    3.92 cm/s LVOT diam:     1.80 cm     LV E/e' lateral: 29.6 LV SV:         45 LV SV Index:   28 LVOT Area:     2.54 cm  LV Volumes (MOD) LV vol d, MOD A2C: 49.6 ml LV vol d, MOD A4C: 67.9 ml LV vol s, MOD A2C: 27.5 ml LV vol s, MOD A4C: 34.2 ml LV SV MOD A2C:     22.1 ml LV SV MOD A4C:     67.9 ml LV SV MOD BP:      29.0 ml RIGHT VENTRICLE RV Basal diam:  3.40 cm    PULMONARY VEINS RV Mid diam:    2.00 cm    A Reversal Duration: 92.00 msec RV S prime:     8.95 cm/s  A Reversal Velocity: 21.40 cm/s TAPSE (M-mode): 1.5 cm  Diastolic Velocity:  43.40 cm/s                            S/D Velocity:        1.40                            Systolic Velocity:   62.10 cm/s LEFT ATRIUM             Index       RIGHT ATRIUM           Index LA diam:        3.90 cm 2.38 cm/m  RA Area:     16.40 cm LA Vol (A2C):   54.8 ml 33.50 ml/m RA Volume:   47.60 ml  29.10 ml/m LA Vol (A4C):   99.6 ml 60.88 ml/m LA Biplane Vol: 74.8 ml 45.72 ml/m  AORTIC VALVE                    PULMONIC VALVE AV Area (Vmax):    1.10 cm     PV Vmax:       0.94 m/s AV Area (Vmean):   1.06 cm     PV Vmean:      65.100 cm/s AV Area (VTI):     0.89 cm     PV VTI:        0.162 m AV Vmax:           211.00 cm/s  PV Peak grad:  3.5 mmHg AV Vmean:          152.000 cm/s PV Mean grad:  2.0 mmHg AV VTI:            0.508 m AV Peak Grad:      17.8 mmHg AV Mean Grad:      11.0 mmHg LVOT Vmax:         91.00 cm/s LVOT Vmean:        63.300 cm/s LVOT VTI:          0.177 m LVOT/AV VTI ratio: 0.35  AORTA Ao Asc diam: 3.50 cm MITRAL VALVE                 TRICUSPID VALVE MV Area (PHT): 3.85 cm      TR Peak grad:   25.6 mmHg MV Peak grad:  6.1 mmHg      TR Vmax:        253.00 cm/s MV Mean grad:  3.0 mmHg MV Vmax:       1.23 m/s      SHUNTS MV Vmean:      85.6 cm/s     Systemic VTI:  0.18 m MV Decel Time: 197 msec      Systemic Diam: 1.80 cm MR Peak grad:    65.6 mmHg MR Mean grad:    47.0 mmHg MR Vmax:         405.00 cm/s MR Vmean:        332.0 cm/s MR PISA:         0.57  cm MR PISA Eff ROA: 3 mm MR PISA Radius:  0.30 cm MV E velocity: 116.00 cm/s MV A velocity: 102.00 cm/s MV E/A ratio:  1.14 Lennie Odor MD Electronically signed by Lennie Odor MD Signature Date/Time: 2020-11-10/3:37:31 PM    Final     Cardiac Studies  TTE 2020/11/10  1. Pronounced septal motion  consistent with LBBB and postop. Left  ventricular ejection fraction, by estimation, is 40 to 45%. The left  ventricle has mildly decreased function. The left ventricle has no  regional wall motion abnormalities. Left  ventricular diastolic parameters are consistent with Grade II diastolic  dysfunction (pseudonormalization).   2. Right ventricular systolic function is mildly reduced. The right  ventricular size is moderately enlarged. There is normal pulmonary artery  systolic pressure. The estimated right ventricular systolic pressure is  28.6 mmHg.   3. Left atrial size was severely dilated.   4. Right atrial size was mildly dilated.   5. The mitral valve is grossly normal. Mild mitral valve regurgitation.  No evidence of mitral stenosis.   6. 23 mm bioprosthetic aortic valve. Vmax 2.1 m/s, MG 11 mmHG, EOA 0.89.  DI 0.35. Normal prosthesis. The aortic valve has been repaired/replaced.  Aortic valve regurgitation is not visualized.   7. The inferior vena cava is normal in size with greater than 50%  respiratory variability, suggesting right atrial pressure of 3 mmHg.   Comparison(s): No significant change from prior study. EF relatively  unchanged.   LHC 11/07/2020  Severe triple vessel CAD s/p CABG with 1/2 patent bypass grafts Chronic sub-total occlusion of the mid LAD. The mid and distal LAD fills from the patent LIMA graft Patent proximal Circumflex stent. Beyond the stent there is a moderate stenosis in the AV groove Circumflex stent. The moderate caliber obtuse marginal branch has a long segment of severe stenosis extending from the ostium into the mid vessel. This has been present  since her last cath. The vessel had been bypassed. The vein graft to the obtuse marginal branch is known to be occluded. The obtuse marginal branch is not a favorable target for PCI. The RCA is a small non-dominant vessel. The vessel is chronically occluded in the mid segment. The distal vessel fills from right to right collaterals.   Recommendations: No good options for PCI. I would not approach the OM branch with PCI as this would likely compromise flow into the AV groove Circumflex which supplies a dominant lateral wall system. Continue medical management of CAD.  Patient Profile  Erin Herman is a 87 y.o. female with CAD status post CABG (recent PCI to proximal circumflex 10/2018), AVR, ischemic cardiomyopathy (EF 35-40%), left bundle branch block, CKD, hypertension who was admitted on 11/06/2020 with unstable angina from [facility].  Assessment & Plan   Unstable angina/non-STEMI/three-vessel CAD status post CABG -Left heart cath shows anatomy is unchanged.  LIMA to LAD is patent.  Vein graft to circumflex is occluded.  Her stent is patent in the proximal circumflex.  She does have a 95% OM1 lesion that is not amendable to PCI.  Medical management was recommended. -Echocardiogram shows LV function is preserved from where she was in the past.  No significant change. -She is euvolemic. -I have added Imdur 15 mg daily.  Would continue this at discharge. -Continue metoprolol succinate 50 mg daily. -She walked the hall yesterday without any significant chest pain.  PT has recommended home health PT.  Stable for discharge. -Continue aspirin and statin therapy.  2.  Ischemic cardiomyopathy -Echo shows EF is unchanged, 40-45% -Kidney function stable.  I have added back her losartan 12.5 mg daily.  Continue Aldactone 12.5 mg daily.  She is on metoprolol succinate 50 mg daily. -She is euvolemic on examination.  She may resume her Lasix 40 mg daily as needed at discharge.  [facility]  will sign off.   Medication Recommendations: Medications as above Other recommendations (labs, testing, etc): None Follow up as an outpatient: She has follow-up in early August in [place].  For questions or updates, please contact [facility] Please consult [email or web address] for contact info under   Time Spent with Patient: I have spent a total of 25 minutes with patient reviewing hospital notes, telemetry, EKGs, labs and examining the patient as well as establishing an assessment and plan that was discussed with the patient.  > 50% of time was spent in direct patient care.    Signed, Lenna Gilford. Flora Lipps, MD, [facility] [place]  [facility]  11/08/2020 8:10 AM

## 2020-11-08 NOTE — Discharge Summary (Signed)
Physician Discharge Summary  Krystal Raffel QQV:[id number] DOB: 11-Nov-1927 DOA: 11/06/2020  PCP: Kirstie Peri, MD  Admit date: 11/06/2020 Discharge date: 11/08/2020  Admitted From: Home  Disposition:  Home   Recommendations for Outpatient Follow-up and new medication changes:  Follow up with Dr. Sherryll Burger in 7 to 10 days.    Home Health: yes  Equipment/Devices: na   Discharge Condition: stable CODE STATUS:  full Diet recommendation: heart healthy   Brief/Interim Summary: Mrs. Quinto was admitted to the hospital with the working diagnosis of atypical chest pain in the setting of severe coronary artery disease.    87 year old female past medical history for coronary artery disease, sp CABG, bioprosthetic arctic valve replacement 2006, chronic kidney disease, hypertension and GERD who presented with acute onset of chest pressure that prompted her to call EMS.  At the time she reached the ED she had persistent chest pain, her blood pressure was 134/66, heart rate 73, temperature 97.8, respiratory rate 14, oxygen saturation 97%, her lungs were clear to auscultation bilaterally, heart S1-S2, present, rhythmic, soft abdomen, no lower extremity edema.   Sodium 135, potassium 3.3, chloride 96, bicarb 30, glucose 134, BUN 40, creatinine 1.0, high sensitive troponin 13-24-42-25, white count 4.4, hemoglobin 10.5, hematocrit 31.5, platelets 188. SARS COVID-19 negative.   Chest radiograph with increased lung markings bilaterally, reticular.   EKG 73 bpm, rightward axis, QTC 500, left bundle branch block, sinus rhythm, poor R wave progression, no significant ST segment or T wave changes.   Patient was placed on heparin drip and underwent cardiac catheterization with coronary angiography. Severe triple vessel CAD sp CABG with 1/2 patent bypass grafts.   Recommendations to continue aggressive medical therapy, not candidate for PCI.  Atypical chest pain, in the setting of severe coronary artery  disease, status post CABG/ bioprosthetic aortic valve replacement. Diastolic heart failure stable with no exacerbation.  Patient had persistent chest pressure, she was placed on heparin drip along with aspirin, metoprolol and rosuvastatin.  Further work-up with coronary angiography showed severe triple CAD status post CABG with 1/2 patent bypass grafts.   Chronic subtotal occlusion of the mid LAD (mid and distal LAD fills from patent graft), patent proximal circumflex stent.  Vein graft to obtuse marginal branch known to be occluded.  RCA small nondominant vessel.  Not good options for PCI.  Recommendations to continue aggressive guideline  medical therapy.  Echocardiography left ventricular fraction 40 to 45%.  No regional wall motion normalities.  Mild reduction in RV systolic function.  RSVP 28.6.  Severe left atrial dilatation.  No significant valvular dysfunction.  Patient has been placed on isosorbide mononitrate, instructions to follow-up as an outpatient. Continue with spironolactone, metoprolol, losartan, furosemide and as needed metolazone.   2.  Hypertension.  Her blood pressure remained controlled position.  3.  Type 2 diabetes mellitus.  Glucose remained stable, she received insulin sliding scale for glucose coverage monitoring.  At discharge will resume with metformin.   4.  Hypothyroidism.  Continue levothyroxine.  5.  Hypokalemia.  Renal function is stable, potassium was corrected with potassium chloride.  6.  Chronic anemia.  Hemoglobin and hematocrit remained stable.  7. Depression. Continue with sertraline.   Discharge Diagnoses:  Principal Problem:   Chest pain Active Problems:   Essential hypertension, benign   CAD, NATIVE VESSEL   Secondary cardiomyopathy (HCC)   Aortic valve disorder   Chronic diastolic heart failure (HCC)   Diabetes (HCC)    Discharge Instructions   Allergies as of 11/08/2020  Reactions   Lipitor [atorvastatin]    MUSCLE  ACHES/FATIGUE   Morphine Nausea And Vomiting   Penicillins Swelling, Other (See Comments)   Tolerated Cephalosporin Date: 04/29/20. Severe arm swelling and redness Did it involve swelling of the face/tongue/throat, SOB, or low BP? No Did it involve sudden or severe rash/hives, skin peeling, or any reaction on the inside of your mouth or nose? No Did you need to seek medical attention at a hospital or doctor's office? No When did it last happen?      30 years If all above answers are "NO", may proceed with cephalosporin use.   Reclast [zoledronic Acid]    "nausea and vomiting. Could not get out of bed.        Medication List     TAKE these medications    APPLE CIDER VINEGAR PO Take 1 tablet by mouth every morning.   aspirin EC 81 MG tablet Take 1 tablet (81 mg total) by mouth daily. Swallow whole.   B-12 2500 MCG Tabs Take 2,500 mcg by mouth daily.   CINNAMON PO Take 1 tablet by mouth 2 (two) times daily.   COQ10 PO Take 1 tablet by mouth daily.   CRANBERRY PO Take 1 tablet by mouth daily.   docusate sodium 100 MG capsule Commonly known as: Colace Take 1 capsule (100 mg total) by mouth 2 (two) times daily.   famotidine 20 MG tablet Commonly known as: PEPCID Take 1 tablet (20 mg total) by mouth 2 (two) times daily.   furosemide 40 MG tablet Commonly known as: LASIX Take 1 tablet (40 mg total) by mouth 2 (two) times daily.   Hair/Skin/Nails/Biotin Tabs Take 1 tablet by mouth daily.   isosorbide mononitrate 30 MG 24 hr tablet Commonly known as: IMDUR Take 0.5 tablets (15 mg total) by mouth daily. Start taking on: November 09, 2020   levothyroxine 88 MCG tablet Commonly known as: SYNTHROID Take 88 mcg by mouth daily before breakfast.   losartan 25 MG tablet Commonly known as: COZAAR Take 0.5 tablets (12.5 mg total) by mouth daily.   metFORMIN 500 MG tablet Commonly known as: GLUCOPHAGE Take 1 tablet (500 mg total) by mouth 2 (two) times daily with a meal.    metolazone 5 MG tablet Commonly known as: ZAROXOLYN Take 1 tablet as needed for swelling   metoprolol succinate 50 MG 24 hr tablet Commonly known as: TOPROL-XL Take 1 tablet (50 mg total) by mouth daily.   MULTIVITAMIN PO Take 1 tablet by mouth daily.   polyethylene glycol 17 g packet Commonly known as: MIRALAX / GLYCOLAX Take 17 g by mouth 2 (two) times daily.   potassium chloride SA 20 MEQ tablet Commonly known as: KLOR-CON Take 1 tablet (20 mEq total) by mouth daily after supper.   rosuvastatin 5 MG tablet Commonly known as: CRESTOR Take 1 tablet (5 mg total) by mouth daily.   sertraline 100 MG tablet Commonly known as: ZOLOFT Take 50 mg by mouth at bedtime.   spironolactone 25 MG tablet Commonly known as: ALDACTONE Take 0.5 tablets (12.5 mg total) by mouth daily.   SYSTANE OP Place 1 drop into both eyes daily.   trimethoprim 100 MG tablet Commonly known as: TRIMPEX Take 100 mg by mouth daily.   vitamin C 500 MG tablet Commonly known as: ASCORBIC ACID Take 500 mg by mouth in the morning and at bedtime.   Vitamin D3 125 MCG (5000 UT) Caps Take 5,000 Units by mouth daily.   Vitamin  E 180 MG (400 UNIT) Caps Take 400 Units by mouth daily.        Follow-up Information     Netta Neat., NP Follow up.   Specialty: Cardiology Why: Hospital follow-up with Cardiology scheduled for 12/04/2020 at 10am with Rennis Harding, one of Dr. Ival Bible NPs in our [place] office. Contact information: [address] [place] [address] [phone]         Kirstie Peri, MD Follow up.   Specialty: Internal Medicine Why: Please follow up in a week. Contact information: [address]  [place] [address] [phone]         Jonelle Sidle, MD .   Specialty: Cardiology Contact information: [address] [place] [address] [phone]                Allergies  Allergen Reactions   Lipitor [Atorvastatin]     MUSCLE  ACHES/FATIGUE   Morphine Nausea And Vomiting   Penicillins Swelling and Other (See Comments)    Tolerated Cephalosporin Date: 04/29/20.  Severe arm swelling and redness Did it involve swelling of the face/tongue/throat, SOB, or low BP? No Did it involve sudden or severe rash/hives, skin peeling, or any reaction on the inside of your mouth or nose? No Did you need to seek medical attention at a hospital or doctor's office? No When did it last happen?      30 years If all above answers are "NO", may proceed with cephalosporin use.   Reclast [Zoledronic Acid]     "nausea and vomiting. Could not get out of bed.    Consultations: Cardiology    Procedures/Studies: CARDIAC CATHETERIZATION  Result Date: 11/07/2020 Formatting of this result is different from the original.   Mid LM lesion is 30% stenosed.   Ost LAD lesion is 25% stenosed.   Prox Cx to Mid Cx lesion is 50% stenosed.   Mid RCA lesion is 100% stenosed.   Origin to Prox Graft lesion is 100% stenosed.   LPAV lesion is 40% stenosed.   1st Mrg lesion is 95% stenosed.   Prox LAD to Mid LAD lesion is 99% stenosed.   Previously placed Ost Cx to Prox Cx stent (unknown type) is  widely patent.   SVG graft was not injected.   LIMA and is normal in caliber.   The graft exhibits no disease. Severe triple vessel CAD s/p CABG with 1/2 patent bypass grafts Chronic sub-total occlusion of the mid LAD. The mid and distal LAD fills from the patent LIMA graft Patent proximal Circumflex stent. Beyond the stent there is a moderate stenosis in the AV groove Circumflex stent. The moderate caliber obtuse marginal branch has a long segment of severe stenosis extending from the ostium into the mid vessel. This has been present since her last cath. The vessel had been bypassed. The vein graft to the obtuse marginal branch is known to be occluded. The obtuse marginal branch is not a favorable target for PCI. The RCA is a small non-dominant vessel. The vessel is  chronically occluded in the mid segment. The distal vessel fills from right to right collaterals. Recommendations: No good options for PCI. I would not approach the OM branch with PCI as this would likely compromise flow into the AV groove Circumflex which supplies a dominant lateral wall system. Continue medical management of CAD.   DG Chest Port 1 View  Result Date: 11/06/2020 CLINICAL DATA:  87 year old female with history of  substernal chest pain. EXAM: PORTABLE CHEST 1 VIEW COMPARISON:  Chest x-ray 05/07/2014. FINDINGS: Lung volumes are normal. No consolidative airspace disease. No pleural effusions. No pneumothorax. No pulmonary nodule or mass noted. Pulmonary vasculature and the cardiomediastinal silhouette are within normal limits. Atherosclerosis in the thoracic aorta. Status post median sternotomy for CABG. IMPRESSION: 1. No radiographic evidence of acute cardiopulmonary disease. 2. Aortic atherosclerosis. Electronically Signed   By: Trudie Reed M.D.   On: 11/06/2020 08:59   ECHOCARDIOGRAM COMPLETE  Result Date: 11/07/2020    ECHOCARDIOGRAM REPORT   Patient Name:   SUSSIE MINOR Date of Exam: 11/07/2020 Medical Rec #:  [id number]           Height:       64.0 in Accession #:    [id number]          Weight:       131.3 lb Date of Birth:  04/22/1928           BSA:          1.636 m Patient Age:    87 years            BP:           99/59 mmHg Patient Gender: F                   HR:           58 bpm. Exam Location:  Inpatient Procedure: 2D Echo, Cardiac Doppler and Color Doppler Indications:    Abnormal EKG  History:        Patient has prior history of Echocardiogram examinations, most                 recent 06/07/2019. Cardiomyopathy and CHF, CAD and Previous                 Myocardial Infarction, Signs/Symptoms:Murmur; Risk                 Factors:Hypertension, Diabetes and Dyslipidemia. 11/06/2018                 coronary stent                 Unkown date: CABG and 23mm [place] bio-AVR.   Sonographer:    Neomia Dear RDCS Referring Phys: [id number] COURAGE EMOKPAE IMPRESSIONS  1. Pronounced septal motion consistent with LBBB and postop. Left ventricular ejection fraction, by estimation, is 40 to 45%. The left ventricle has mildly decreased function. The left ventricle has no regional wall motion abnormalities. Left ventricular diastolic parameters are consistent with Grade II diastolic dysfunction (pseudonormalization).  2. Right ventricular systolic function is mildly reduced. The right ventricular size is moderately enlarged. There is normal pulmonary artery systolic pressure. The estimated right ventricular systolic pressure is 28.6 mmHg.  3. Left atrial size was severely dilated.  4. Right atrial size was mildly dilated.  5. The mitral valve is grossly normal. Mild mitral valve regurgitation. No evidence of mitral stenosis.  6. 23 mm bioprosthetic aortic valve. Vmax 2.1 m/s, MG 11 mmHG, EOA 0.89. DI 0.35. Normal prosthesis. The aortic valve has been repaired/replaced. Aortic valve regurgitation is not visualized.  7. The inferior vena cava is normal in size with greater than 50% respiratory variability, suggesting right atrial pressure of 3 mmHg. Comparison(s): No significant change from prior study. EF relatively unchanged. FINDINGS  Left Ventricle: Pronounced septal motion consistent with LBBB and postop. Left ventricular ejection fraction, by estimation, is 40 to 45%.  The left ventricle has mildly decreased function. The left ventricle has no regional wall motion abnormalities. The left ventricular internal cavity size was normal in size. There is no left ventricular hypertrophy. Abnormal (paradoxical) septal motion, consistent with left bundle branch block and abnormal (paradoxical) septal motion consistent with post-operative  status. Left ventricular diastolic parameters are consistent with Grade II diastolic dysfunction (pseudonormalization). Right Ventricle: The right ventricular size is  moderately enlarged. No increase in right ventricular wall thickness. Right ventricular systolic function is mildly reduced. There is normal pulmonary artery systolic pressure. The tricuspid regurgitant velocity is 2.53 m/s, and with an assumed right atrial pressure of 3 mmHg, the estimated right ventricular systolic pressure is 28.6 mmHg. Left Atrium: Left atrial size was severely dilated. Right Atrium: Right atrial size was mildly dilated. Pericardium: Trivial pericardial effusion is present. Mitral Valve: The mitral valve is grossly normal. Mild mitral valve regurgitation. No evidence of mitral valve stenosis. MV peak gradient, 6.1 mmHg. The mean mitral valve gradient is 3.0 mmHg. Tricuspid Valve: The tricuspid valve is grossly normal. Tricuspid valve regurgitation is trivial. No evidence of tricuspid stenosis. Aortic Valve: 23 mm bioprosthetic aortic valve. Vmax 2.1 m/s, MG 11 mmHG, EOA 0.89. DI 0.35. Normal prosthesis. The aortic valve has been repaired/replaced. Aortic valve regurgitation is not visualized. Aortic valve mean gradient measures 11.0 mmHg. Aortic valve peak gradient measures 17.8 mmHg. Aortic valve area, by VTI measures 0.89 cm. Pulmonic Valve: The pulmonic valve was grossly normal. Pulmonic valve regurgitation is trivial. No evidence of pulmonic stenosis. Aorta: The aortic root and ascending aorta are structurally normal, with no evidence of dilitation. Venous: The inferior vena cava is normal in size with greater than 50% respiratory variability, suggesting right atrial pressure of 3 mmHg. IAS/Shunts: The atrial septum is grossly normal.  LEFT VENTRICLE PLAX 2D LVIDd:         4.70 cm     Diastology LVIDs:         3.10 cm     LV e' medial:    7.29 cm/s LV PW:         1.20 cm     LV E/e' medial:  15.9 LV IVS:        1.10 cm     LV e' lateral:   3.92 cm/s LVOT diam:     1.80 cm     LV E/e' lateral: 29.6 LV SV:         45 LV SV Index:   28 LVOT Area:     2.54 cm  LV Volumes (MOD) LV vol d, MOD  A2C: 49.6 ml LV vol d, MOD A4C: 67.9 ml LV vol s, MOD A2C: 27.5 ml LV vol s, MOD A4C: 34.2 ml LV SV MOD A2C:     22.1 ml LV SV MOD A4C:     67.9 ml LV SV MOD BP:      29.0 ml RIGHT VENTRICLE RV Basal diam:  3.40 cm    PULMONARY VEINS RV Mid diam:    2.00 cm    A Reversal Duration: 92.00 msec RV S prime:     8.95 cm/s  A Reversal Velocity: 21.40 cm/s TAPSE (M-mode): 1.5 cm     Diastolic Velocity:  43.40 cm/s                            S/D Velocity:        1.40  Systolic Velocity:   62.10 cm/s LEFT ATRIUM             Index       RIGHT ATRIUM           Index LA diam:        3.90 cm 2.38 cm/m  RA Area:     16.40 cm LA Vol (A2C):   54.8 ml 33.50 ml/m RA Volume:   47.60 ml  29.10 ml/m LA Vol (A4C):   99.6 ml 60.88 ml/m LA Biplane Vol: 74.8 ml 45.72 ml/m  AORTIC VALVE                    PULMONIC VALVE AV Area (Vmax):    1.10 cm     PV Vmax:       0.94 m/s AV Area (Vmean):   1.06 cm     PV Vmean:      65.100 cm/s AV Area (VTI):     0.89 cm     PV VTI:        0.162 m AV Vmax:           211.00 cm/s  PV Peak grad:  3.5 mmHg AV Vmean:          152.000 cm/s PV Mean grad:  2.0 mmHg AV VTI:            0.508 m AV Peak Grad:      17.8 mmHg AV Mean Grad:      11.0 mmHg LVOT Vmax:         91.00 cm/s LVOT Vmean:        63.300 cm/s LVOT VTI:          0.177 m LVOT/AV VTI ratio: 0.35  AORTA Ao Asc diam: 3.50 cm MITRAL VALVE                 TRICUSPID VALVE MV Area (PHT): 3.85 cm      TR Peak grad:   25.6 mmHg MV Peak grad:  6.1 mmHg      TR Vmax:        253.00 cm/s MV Mean grad:  3.0 mmHg MV Vmax:       1.23 m/s      SHUNTS MV Vmean:      85.6 cm/s     Systemic VTI:  0.18 m MV Decel Time: 197 msec      Systemic Diam: 1.80 cm MR Peak grad:    65.6 mmHg MR Mean grad:    47.0 mmHg MR Vmax:         405.00 cm/s MR Vmean:        332.0 cm/s MR PISA:         0.57 cm MR PISA Eff ROA: 3 mm MR PISA Radius:  0.30 cm MV E velocity: 116.00 cm/s MV A velocity: 102.00 cm/s MV E/A ratio:  1.14 Lennie OdorWesley O'Neal MD  Electronically signed by Lennie OdorWesley O'Neal MD Signature Date/Time: 11/07/2020/3:37:31 PM    Final      Procedures: cardiac cath   Subjective: Patient is feeling better, no dyspnea or chest pain, no nausea or vomiting.   Discharge Exam: Vitals:   11/08/20 0330 11/08/20 0755  BP: 125/71 131/71  Pulse: 72 70  Resp: 16   Temp: 97.9 F (36.6 C) 98.4 F (36.9 C)  SpO2: 93% 100%   Vitals:   11/07/20 1642 11/07/20 1915 11/08/20 0330 11/08/20 0755  BP: 132/67 124/73 125/71 131/71  Pulse: 72  70 72 70  Resp: 18 18 16    Temp: 97.9 F (36.6 C) 97.8 F (36.6 C) 97.9 F (36.6 C) 98.4 F (36.9 C)  TempSrc: Oral Oral Oral Oral  SpO2: 99% 98% 93% 100%  Weight:   59.6 kg   Height:        General: Not in pain or dyspnea  Neurology: Awake and alert, non focal  E ENT: no pallor, no icterus, oral mucosa moist Cardiovascular: No JVD. S1-S2 present, rhythmic, no gallops, rubs, or murmurs. No lower extremity edema. Pulmonary: positive breath sounds bilaterally, adequate air movement, no wheezing, rhonchi or rales. Gastrointestinal. Abdomen soft and non tender Skin. No rashes Musculoskeletal: no joint deformities   The results of significant diagnostics from this hospitalization (including imaging, microbiology, ancillary and laboratory) are listed below for reference.     Microbiology: Recent Results (from the past 240 hour(s))  Resp Panel by RT-PCR (Flu A&B, Covid) Nasopharyngeal Swab     Status: None   Collection Time: 11/06/20 12:04 PM   Specimen: Nasopharyngeal Swab; Nasopharyngeal(NP) swabs in vial transport medium  Result Value Ref Range Status   SARS Coronavirus 2 by RT PCR NEGATIVE NEGATIVE Final    Comment: (NOTE) SARS-CoV-2 target nucleic acids are NOT DETECTED.  The SARS-CoV-2 RNA is generally detectable in upper respiratory specimens during the acute phase of infection. The lowest concentration of SARS-CoV-2 viral copies this assay can detect is 138 copies/mL. A negative  result does not preclude SARS-Cov-2 infection and should not be used as the sole basis for treatment or other patient management decisions. A negative result may occur with  improper specimen collection/handling, submission of specimen other than nasopharyngeal swab, presence of viral mutation(s) within the areas targeted by this assay, and inadequate number of viral copies(<138 copies/mL). A negative result must be combined with clinical observations, patient history, and epidemiological information. The expected result is Negative.  Fact Sheet for Patients:  11/08/20  Fact Sheet for Healthcare Providers:  BloggerCourse.com  This test is no t yet approved or cleared by the [employer] FDA and  has been authorized for detection and/or diagnosis of SARS-CoV-2 by FDA under an Emergency Use Authorization (EUA). This EUA will remain  in effect (meaning this test can be used) for the duration of the COVID-19 declaration under Section 564(b)(1) of the Act, 21 U.S.C.section 360bbb-3(b)(1), unless the authorization is terminated  or revoked sooner.       Influenza A by PCR NEGATIVE NEGATIVE Final   Influenza B by PCR NEGATIVE NEGATIVE Final    Comment: (NOTE) The Xpert Xpress SARS-CoV-2/FLU/RSV plus assay is intended as an aid in the diagnosis of influenza from Nasopharyngeal swab specimens and should not be used as a sole basis for treatment. Nasal washings and aspirates are unacceptable for Xpert Xpress SARS-CoV-2/FLU/RSV testing.  Fact Sheet for Patients: [place]  Fact Sheet for Healthcare Providers: BloggerCourse.com  This test is not yet approved or cleared by the [employer] FDA and has been authorized for detection and/or diagnosis of SARS-CoV-2 by FDA under an Emergency Use Authorization (EUA). This EUA will remain in effect (meaning this test can be used)  for the duration of the COVID-19 declaration under Section 564(b)(1) of the Act, 21 U.S.C. section 360bbb-3(b)(1), unless the authorization is terminated or revoked.  Performed at [facility], [address] [place], [place]      Labs: BNP (last 3 results) No results for input(s): BNP in the last 8760 hours. Basic Metabolic Panel: Recent Labs  Lab 11/06/20 0851 11/06/20 1803 11/07/20 0540 11/08/20 0640  NA 135  --  134* 136  K 3.3*  --  4.0 4.2  CL 96*  --  97* 102  CO2 30  --  27 28  GLUCOSE 134*  --  138* 135*  BUN 40*  --  31* 23  CREATININE 1.02*  --  1.02* 1.06*  CALCIUM 8.9  --  9.6 9.2  MG  --  1.8  --   --    Liver Function Tests: No results for input(s): AST, ALT, ALKPHOS, BILITOT, PROT, ALBUMIN in the last 168 hours. Recent Labs  Lab 11/07/20 0540  LIPASE 34   No results for input(s): AMMONIA in the last 168 hours. CBC: Recent Labs  Lab 11/06/20 0851 11/07/20 0540  WBC 4.4 6.3  NEUTROABS 2.8  --   HGB 10.5* 10.8*  HCT 31.5* 31.5*  MCV 97.5 94.6  PLT 188 205   Cardiac Enzymes: No results for input(s): CKTOTAL, CKMB, CKMBINDEX, TROPONINI in the last 168 hours. BNP: Invalid input(s): POCBNP CBG: Recent Labs  Lab 11/07/20 0622 11/07/20 1254 11/07/20 1648 11/07/20 2106 11/08/20 0558  GLUCAP 168* 98 196* 118* 141*   D-Dimer No results for input(s): DDIMER in the last 72 hours. Hgb A1c Recent Labs    11/06/20 0851  HGBA1C 7.0*   Lipid Profile Recent Labs    11/07/20 0540  CHOL 134  HDL 37*  LDLCALC 78  TRIG 93  CHOLHDL 3.6   Thyroid function studies No results for input(s): TSH, T4TOTAL, T3FREE, THYROIDAB in the last 72 hours.  Invalid input(s): FREET3 Anemia work up No results for input(s): VITAMINB12, FOLATE, FERRITIN, TIBC, IRON, RETICCTPCT in the last 72 hours. Urinalysis    Component Value Date/Time   COLORURINE YELLOW 05/20/2014 0802   APPEARANCEUR CLEAR 05/20/2014 0802   LABSPEC 1.009 05/20/2014 0802    PHURINE 7.5 05/20/2014 0802   GLUCOSEU NEGATIVE 05/20/2014 0802   HGBUR NEGATIVE 05/20/2014 0802   BILIRUBINUR NEGATIVE 05/20/2014 0802   KETONESUR NEGATIVE 05/20/2014 0802   PROTEINUR NEGATIVE 05/20/2014 0802   UROBILINOGEN 0.2 05/20/2014 0802   NITRITE NEGATIVE 05/20/2014 0802   LEUKOCYTESUR NEGATIVE 05/20/2014 0802   Sepsis Labs Invalid input(s): PROCALCITONIN,  WBC,  LACTICIDVEN Microbiology Recent Results (from the past 240 hour(s))  Resp Panel by RT-PCR (Flu A&B, Covid) Nasopharyngeal Swab     Status: None   Collection Time: 11/06/20 12:04 PM   Specimen: Nasopharyngeal Swab; Nasopharyngeal(NP) swabs in vial transport medium  Result Value Ref Range Status   SARS Coronavirus 2 by RT PCR NEGATIVE NEGATIVE Final    Comment: (NOTE) SARS-CoV-2 target nucleic acids are NOT DETECTED.  The SARS-CoV-2 RNA is generally detectable in upper respiratory specimens during the acute phase of infection. The lowest concentration of SARS-CoV-2 viral copies this assay can detect is 138 copies/mL. A negative result does not preclude SARS-Cov-2 infection and should not be used as the sole basis for treatment or other patient management decisions. A negative result may occur with  improper specimen collection/handling, submission of specimen other than nasopharyngeal swab, presence of viral mutation(s) within the areas targeted by this assay, and inadequate number of viral copies(<138 copies/mL). A negative result must be combined with clinical observations, patient history, and epidemiological information. The expected result is Negative.  Fact Sheet for Patients:  BloggerCourse.com  Fact Sheet for Healthcare Providers:  SeriousBroker.it  This test is no t yet approved or cleared by the [place] FDA and  has been  authorized for detection and/or diagnosis of SARS-CoV-2 by FDA under an Emergency Use Authorization (EUA). This EUA will  remain  in effect (meaning this test can be used) for the duration of the COVID-19 declaration under Section 564(b)(1) of the Act, 21 U.S.C.section 360bbb-3(b)(1), unless the authorization is terminated  or revoked sooner.       Influenza A by PCR NEGATIVE NEGATIVE Final   Influenza B by PCR NEGATIVE NEGATIVE Final    Comment: (NOTE) The Xpert Xpress SARS-CoV-2/FLU/RSV plus assay is intended as an aid in the diagnosis of influenza from Nasopharyngeal swab specimens and should not be used as a sole basis for treatment. Nasal washings and aspirates are unacceptable for Xpert Xpress SARS-CoV-2/FLU/RSV testing.  Fact Sheet for Patients: BloggerCourse.com  Fact Sheet for Healthcare Providers: SeriousBroker.it  This test is not yet approved or cleared by the [place] FDA and has been authorized for detection and/or diagnosis of SARS-CoV-2 by FDA under an Emergency Use Authorization (EUA). This EUA will remain in effect (meaning this test can be used) for the duration of the COVID-19 declaration under Section 564(b)(1) of the Act, 21 U.S.C. section 360bbb-3(b)(1), unless the authorization is terminated or revoked.  Performed at [facility], [address], [place], [place] [address]      Time coordinating discharge: 45 minutes  SIGNED:   Coralie Keens, MD  [facility] 11/08/2020, 9:52 AM

## 2020-11-08 NOTE — Care Management (Signed)
Telephone call received from patient's daughter Santiago Glad indicating patient is adamantly refusing home health.  Made Denyse Amass aware with [facility] that services are declined.   Will make nursing and MD aware.    Raiford Noble, MSN, RN,BSN [facility]patient [facility] Case Manager [phone]

## 2020-11-08 NOTE — Evaluation (Signed)
Occupational Therapy Evaluation Patient Details Name: Erin Herman MRN: [id number] DOB: [birth year] Today's Date: 11/08/2020    History of Present Illness The pt is a 87 yo female presenting 7/14 with c/o substernal chest pain that did not diminish with NG. Work up reveals acute unstable angina in setting of severe CAD with triple vessel CAD and ischemic cardiomyopathy (EF 35-40%). S/p L heart cath 7/15. PMH includes: HTN, CHF, and DM II, hypothyroidism, hypokalemia, and anemia.   Clinical Impression   Pt admitted with above. Pt lives with their 24 yr old husband but also has a daughter who can assist daily. Pt was educated about use of shower transfer bench to decrease in fall risk and AE with higher level outdoor activities. As pt reports they will complete gardening activities with use of tools to stabilize self. Pt was able to complete UE/LE dressing with supervision to cue on hand placement. Pt currently with functional limitations due to the deficits listed below (see OT Problem List).  Pt will benefit from skilled acute OT to increase their safety and independence with ADL and functional mobility for ADL to facilitate discharge to venue listed below.      Follow Up Recommendations  Supervision - Intermittent (assist with shower transfers)    Equipment Recommendations  Tub/shower bench    Recommendations for Other Services       Precautions / Restrictions Precautions Precautions: Fall Restrictions Weight Bearing Restrictions: No      Mobility Bed Mobility Overal bed mobility: Independent                  Transfers Overall transfer level: Needs assistance Equipment used: Rolling walker (2 wheeled) Transfers: Sit to/from Stand Sit to Stand: Supervision              Balance Overall balance assessment: Mild deficits observed, not formally tested                                         ADL either performed or assessed with clinical  judgement   ADL Overall ADL's : Needs assistance/impaired Eating/Feeding: Independent;Sitting   Grooming: Wash/dry hands;Wash/dry face;Supervision/safety;Standing;Cueing for safety;Cueing for sequencing   Upper Body Bathing: Supervision/ safety;Cueing for safety;Sitting;Cueing for sequencing   Lower Body Bathing: Supervison/ safety;Sit to/from stand;Cueing for safety;Cueing for sequencing   Upper Body Dressing : Supervision/safety;Sitting;Cueing for safety;Cueing for sequencing   Lower Body Dressing: Supervision/safety;Cueing for safety;Cueing for sequencing;Sit to/from stand   Toilet Transfer: Supervision/safety;Cueing for safety;Cueing for sequencing   Toileting- Clothing Manipulation and Hygiene: Supervision/safety;Cueing for safety;Cueing for sequencing;Sit to/from stand   Tub/ Engineer, structural: Cueing for safety;Cueing for sequencing;Minimal assistance   Functional mobility during ADLs: Supervision/safety;Cueing for safety;Cueing for sequencing;Rolling walker General ADL Comments: Pt cued to keep hand on walker with mobility     Vision Baseline Vision/History: Wears glasses Wears Glasses: At all times Patient Visual Report: No change from baseline       Perception     Praxis      Pertinent Vitals/Pain Pain Assessment: No/denies pain     Hand Dominance Right   Extremity/Trunk Assessment Upper Extremity Assessment Upper Extremity Assessment: Generalized weakness   Lower Extremity Assessment Lower Extremity Assessment: Generalized weakness   Cervical / Trunk Assessment Cervical / Trunk Assessment: Kyphotic   Communication Communication Communication: No difficulties   Cognition Arousal/Alertness: Awake/alert Behavior During Therapy: WFL for tasks assessed/performed Overall Cognitive Status: Within Functional  Limits for tasks assessed                                     General Comments       Exercises     Shoulder Instructions       Home Living Family/patient expects to be discharged to:: Private residence Living Arrangements: Spouse/significant other Available Help at Discharge: Family;Available 24 hours/day Type of Home: House Home Access: Stairs to enter [employer] of Steps: 3 Entrance Stairs-Rails: Can reach both Home Layout: One level     Bathroom Shower/Tub: Tub/shower unit;Curtain   Firefighter: Standard Bathroom Accessibility: Yes       Additional Comments: pt lives with her 28 y.o. husband and her daughter is able to help while she recovers.      Prior Functioning/Environment Level of Independence: Independent with assistive device(s)        Comments: pt reports using quad cane for most mobility, but will take rolaltor for longer distances. daughter will bring meals often.        OT Problem List: Decreased strength;Decreased activity tolerance;Impaired balance (sitting and/or standing);Decreased safety awareness;Decreased knowledge of use of DME or AE      OT Treatment/Interventions: Self-care/ADL training;Therapeutic exercise;DME and/or AE instruction;Energy conservation;Patient/family education;Balance training    OT Goals(Current goals can be found in the care plan section) Acute Rehab OT Goals Patient Stated Goal: return home OT Goal Formulation: With patient Time For Goal Achievement: 11/21/20 Potential to Achieve Goals: Good ADL Goals Pt Will Perform Tub/Shower Transfer: Shower transfer;with set-up;ambulating;shower seat;grab bars Additional ADL Goal #1: Pt will tolerate 15 mins of standing activity with no LOB or SOB  OT Frequency: Min 2X/week   Barriers to D/C:            Co-evaluation              AM-PAC OT "6 Clicks" Daily Activity     Outcome Measure Help from another person eating meals?: None Help from another person taking care of personal grooming?: None Help from another person toileting, which includes using toliet, bedpan, or urinal?:  None Help from another person bathing (including washing, rinsing, drying)?: A Little Help from another person to put on and taking off regular upper body clothing?: None Help from another person to put on and taking off regular lower body clothing?: None 6 Click Score: 23   End of Session Equipment Utilized During Treatment: Gait belt;Rolling walker  Activity Tolerance: Patient tolerated treatment well Patient left: in bed;with call bell/phone within reach  OT Visit Diagnosis: Unsteadiness on feet (R26.81);Other abnormalities of gait and mobility (R26.89)                Time: 6283-6629 OT Time Calculation (min): 22 min Charges:  OT General Charges $OT Visit: 1 Visit OT Evaluation $OT Eval Low Complexity: 1 Low  Alphia Moh OTR/L  Acute Rehab Services  [phone] office number [phone] pager number   Alphia Moh 11/08/2020, 8:54 AM

## 2020-11-08 NOTE — Progress Notes (Signed)
Physical Therapy Treatment Patient Details Name: Erin Herman MRN: [id number] DOB: 10/20/1927 Today's Date: 11/08/2020    History of Present Illness Pt is a 87 y.o. female admitted 11/06/20 with c/o substernal chest pain that did not diminish with NG. Workup for acute unstable angina in setting of severe CAD with triple vessel CAD, ischemic cardiomyopathy (EF 35-40%). S/p LHC 7/15. Recommendation for medical management. PMH includes HTN, CHF, DM II, hypothyroidism, anemia.   PT Comments    Pt progressing well with mobility. Pt tolerated additional gait and stair training with supervision for safety. DOE 2/4 with stairs. Post-ambulation SpO2 98% on RA, HR 94. Pt preparing for d/c home today. Reviewed educ re: activity recommendations, fall risk reduction. Pt reports no further questions or concerns. If pt to remain admitted, will continue to follow acutely.    Follow Up Recommendations  Home health PT;Supervision for mobility/OOB     Equipment Recommendations  None recommended by PT    Recommendations for Other Services       Precautions / Restrictions Precautions Precautions: Fall Restrictions Weight Bearing Restrictions: No    Mobility  Bed Mobility Overal bed mobility: Independent                  Transfers Overall transfer level: Independent Equipment used: None Transfers: Sit to/from Stand Sit to Stand: Supervision            Ambulation/Gait Ambulation/Gait assistance: Supervision Gait Distance (Feet): 360 Feet Assistive device: None;4-wheeled walker Gait Pattern/deviations: Step-through pattern;Decreased stride length;Trunk flexed     General Gait Details: Slow, steady gait short distance in room without DME, reaching to furniture at times for added stability; additional hallway ambulation distance with rollator, supervision due to fall risk, but no overt instability or LOB noted. Minimal to no DOE noted   Stairs Stairs: Yes Stairs assistance:  Supervision Stair Management: Two rails;Alternating pattern;Forwards Number of Stairs: 6 General stair comments: Ascend/descended 6 steps with bilateral rail support, alternating pattern, supervision for safety; DOE 2/4   Wheelchair Mobility    Modified Rankin (Stroke Patients Only)       Balance Overall balance assessment: Needs assistance   Sitting balance-Leahy Scale: Good       Standing balance-Leahy Scale: Fair Standing balance comment: Can stand and ambulate without UE support; dynamic stability improved with single UE support               High Level Balance Comments: Able to pick up object from floor without UE support            Cognition Arousal/Alertness: Awake/alert Behavior During Therapy: WFL for tasks assessed/performed Overall Cognitive Status: Within Functional Limits for tasks assessed                                        Exercises      General Comments General comments (skin integrity, edema, etc.): SpO2 98% on RA, HR 94 post-ambulation. Discussed safety with outdoor activity (mainly gardening), including heat during summer months and fall risk      Pertinent Vitals/Pain Pain Assessment: No/denies pain    Home Living Family/patient expects to be discharged to:: Private residence Living Arrangements: Spouse/significant other Available Help at Discharge: Family;Available 24 hours/day Type of Home: House Home Access: Stairs to enter Entrance Stairs-Rails: Can reach both Home Layout: One level   Additional Comments: pt lives with her 53 y.o. husband and her daughter  is able to help while she recovers.    Prior Function Level of Independence: Independent with assistive device(s)      Comments: pt reports using quad cane for most mobility, but will take rolaltor for longer distances. daughter will bring meals often.   PT Goals (current goals can now be found in the care plan section) Acute Rehab PT Goals Patient  Stated Goal: return home Progress towards PT goals: Progressing toward goals    Frequency    Min 3X/week      PT Plan Current plan remains appropriate    Co-evaluation              AM-PAC PT "6 Clicks" Mobility   Outcome Measure  Help needed turning from your back to your side while in a flat bed without using bedrails?: None Help needed moving from lying on your back to sitting on the side of a flat bed without using bedrails?: None Help needed moving to and from a bed to a chair (including a wheelchair)?: None Help needed standing up from a chair using your arms (e.g., wheelchair or bedside chair)?: None Help needed to walk in hospital room?: A Little Help needed climbing 3-5 steps with a railing? : A Little 6 Click Score: 22    End of Session   Activity Tolerance: Patient tolerated treatment well Patient left: in bed;with call bell/phone within reach Nurse Communication: Mobility status PT Visit Diagnosis: Other abnormalities of gait and mobility (R26.89);Muscle weakness (generalized) (M62.81)     Time: 8144-8185 PT Time Calculation (min) (ACUTE ONLY): 15 min  Charges:  $Gait Training: 8-22 mins                     Ina Homes, PT, DPT Acute Rehabilitation Services  Pager [phone] Office [phone]  [name] 11/08/2020, 10:29 AM

## 2020-11-11 ENCOUNTER — Telehealth: Payer: Self-pay | Admitting: Cardiology

## 2020-11-11 NOTE — Telephone Encounter (Signed)
Reports symptoms are better now and she is no longer having chest pain. Says after taking imdur 15 mg, chest pain resolved. Advised to continue current medications and continue monitoring symptoms and if they get worse, to go to the ED for an evaluation. Verbalized understanding of plan.

## 2020-11-11 NOTE — Telephone Encounter (Signed)
Erin Herman -daughter called stating that her mother had another "spell" this morning. States that its chest tightness.. Patient took her new medication isosorbide mononitrate 30mg  at 10:00am (today) states now she is feeling better since taking the medication.   [phone].or call patient's home.

## 2020-11-14 ENCOUNTER — Telehealth: Payer: Self-pay | Admitting: Cardiology

## 2020-11-14 DIAGNOSIS — I25119 Atherosclerotic heart disease of native coronary artery with unspecified angina pectoris: Secondary | ICD-10-CM

## 2020-11-14 MED ORDER — ISOSORBIDE MONONITRATE ER 30 MG PO TB24: 15.0000 mg | ORAL_TABLET | Freq: Two times a day (BID) | ORAL | 3 refills | Status: DC

## 2020-11-14 NOTE — Telephone Encounter (Signed)
Spoke with pts daughter. She agrees to increase isosorbide to 15mg  bid. A new Rx was sent in per her request. She will call the office back if symptoms do not resolve.

## 2020-11-14 NOTE — Telephone Encounter (Signed)
Pts daughter is calling to report ongoing CP. She states it has improved with the isosorbide, but is still noticeable. This CP is constant and does not vary with activity. Her BP's are running in the 120's/70's. She states they are normally in the low 100's/50's.   I advised her I would send to Dr. Diona Browner to review and we would contact her back.

## 2020-11-14 NOTE — Telephone Encounter (Signed)
Erin Herman (Daughter called said the tightness in her chest is not going away wants to know what Dr Diona Browner suggest she do about it. She said it has eased up some but not gone away. Is this normal or something to expect. She is concerned. Please call her #[phone]

## 2020-12-03 NOTE — Progress Notes (Addendum)
Cardiology Office Note  Date: 12/04/2020   ID: Erin Herman, DOB [birth year], MRN [id number]  PCP:  Kirstie Peri, MD  Cardiologist:  Nona Dell, MD Electrophysiologist:  None   Chief Complaint: 2-3 weeks follow up  History of Present Illness: Erin Herman is a 87 y.o. female with a history of Aortic stenosis with AVR, CAD,/CABG CKD, HTN,anemia, HTN, cardiomyopathy,anemia.  Recent admission for chest pain. She presented to [facility] with onset of chest pressure. She began having chest pain at presentation to ED. She underwent cardiac catheterization with severe 3 vessel disease status post CABG with 1/2 patent bypass grafts.  See cath report below.  Chronic subtotal occlusion of mid LAD (mid and distal LAD fill from patent graft), patent proximal circumflex stent.Vein graft to OM occluded. RCA small non dominant vessel. No good options for PCI. Recommended aggressive GDMT. Echo with EF 40-45% , no WMA's mildly reduced RV function. Severe LA dilatation. No significant valvular dysfunction.   She is here for follow-up status post recent cardiac catheterization for chest pain.  She states she has had no more episodes of chest pain since hospital discharge.  She denies any DOE or SOB, orthostatic symptoms, CVA or TIA-like symptoms, palpitations or arrhythmias, PND, orthopnea, bleeding.  Denies any claudication-like symptoms, DVT or PE-like symptoms, or lower extremity edema.  She denies any issues with her left radial access site.  Vital signs are stable today.  She is continuing her current cardiac regimen of; aspirin 81 mg daily, Lasix 40 mg p.o. twice daily, Imdur 15 mg p.o. twice daily.  Losartan 12.5 mg p.o. daily.  Metolazone 5 mg as needed for swelling, Toprol-XL 50 mg daily, spironolactone 12.5 mg p.o. daily, potassium 20 mg daily.  Past Medical History:  Diagnosis Date   Anemia    iron   Anxiety    Aortic stenosis    Bioprosthetic AVR 2006   CAD (coronary artery  disease), native coronary artery    Multivessel status post CABG 2006, occluded SVG to circumflex, DES to circumflex July 2020   Cardiomyopathy [facility])    Chronic kidney disease    Depression    Dysphagia    Essential hypertension    Family history of adverse reaction to anesthesia    N&V   GERD (gastroesophageal reflux disease)    Headache    History of hiatal hernia    HOH (hard of hearing)    LBBB (left bundle branch block)    Mixed hyperlipidemia    NSTEMI (non-ST elevated myocardial infarction) [facility])    July 2020   Osteoarthritis    Postoperative nausea and vomiting    Morphine   Type 2 diabetes mellitus (HCC)    UTI (lower urinary tract infection)     Past Surgical History:  Procedure Laterality Date   ABDOMINAL HYSTERECTOMY     partial   AORTIC VALVE REPLACEMENT  2006   Dr. Cornelius Moras - 23 mm [place] stentless cadaver valve   CARDIAC CATHETERIZATION  2020   with stents   CHOLECYSTECTOMY     CORONARY ARTERY BYPASS GRAFT  2006    Dr. Cornelius Moras - LIMA to LAD, SVG to circumflex   CORONARY STENT INTERVENTION N/A 11/06/2018   Procedure: CORONARY STENT INTERVENTION;  Surgeon: Yvonne Kendall, MD;  Location: [facility] INVASIVE CV [facility];  Service: Cardiovascular;  Laterality: N/A;   CORONARY/GRAFT ANGIOGRAPHY N/A 11/06/2018   Procedure: CORONARY/GRAFT ANGIOGRAPHY;  Surgeon: Yvonne Kendall, MD;  Location: [facility] INVASIVE CV LAB;  Service: Cardiovascular;  Laterality: N/A;  LEFT HEART CATH AND CORS/GRAFTS ANGIOGRAPHY N/A 11/07/2020   Procedure: LEFT HEART CATH AND CORS/GRAFTS ANGIOGRAPHY;  Surgeon: Kathleene Hazel, MD;  Location: [facility] INV[facility] CV [facility];  Service: Cardiovascular;  Laterality: N/A;   THYROID LOBECTOMY Right    TOTAL HIP ARTHROPLASTY Right 03/01/2017   Procedure: RIGHT TOTAL HIP ARTHROPLASTY ANTERIOR APPROACH;  Surgeon: Durene Romans, MD;  Location: [facility] ORS;  Service: Orthopedics;  Laterality: Right;  70 mins   TOTAL HIP ARTHROPLASTY Left 04/29/2020   Procedure: TOTAL HIP ARTHROPLASTY  ANTERIOR APPROACH;  Surgeon: Durene Romans, MD;  Location: [facility] ORS;  Service: Orthopedics;  Laterality: Left;  70 mins   TOTAL KNEE ARTHROPLASTY Right 2006   TOTAL KNEE ARTHROPLASTY Left 05/20/2014   DR Thurston Hole   TOTAL KNEE ARTHROPLASTY Left 05/20/2014   Procedure: LEFT TOTAL KNEE ARTHROPLASTY;  Surgeon: Nilda Simmer, MD;  Location: [facility] OR;  Service: Orthopedics;  Laterality: Left;    Current Outpatient Medications  Medication Sig Dispense Refill   APPLE CIDER VINEGAR PO Take 1 tablet by mouth every morning.     aspirin EC 81 MG tablet Take 1 tablet (81 mg total) by mouth daily. Swallow whole. 90 tablet 3   Cholecalciferol (VITAMIN D3) 125 MCG (5000 UT) CAPS Take 5,000 Units by mouth daily.     CINNAMON PO Take 1 tablet by mouth 2 (two) times daily.     Coenzyme Q10 (COQ10 PO) Take 1 tablet by mouth daily.     CRANBERRY PO Take 1 tablet by mouth daily.     Cyanocobalamin (B-12) 2500 MCG TABS Take 2,500 mcg by mouth daily.     docusate sodium (COLACE) 100 MG capsule Take 1 capsule (100 mg total) by mouth 2 (two) times daily. 28 capsule 0   famotidine (PEPCID) 20 MG tablet Take 1 tablet (20 mg total) by mouth 2 (two) times daily. 60 tablet 1   furosemide (LASIX) 40 MG tablet Take 1 tablet (40 mg total) by mouth 2 (two) times daily. 60 tablet 6   isosorbide mononitrate (IMDUR) 30 MG 24 hr tablet Take 0.5 tablets (15 mg total) by mouth 2 (two) times daily. (Patient taking differently: Take 15 mg by mouth daily.) 90 tablet 3   levothyroxine (SYNTHROID) 88 MCG tablet Take 88 mcg by mouth daily before breakfast.     losartan (COZAAR) 25 MG tablet Take 0.5 tablets (12.5 mg total) by mouth daily. 15 tablet 3   metFORMIN (GLUCOPHAGE) 500 MG tablet Take 1 tablet (500 mg total) by mouth 2 (two) times daily with a meal.     metolazone (ZAROXOLYN) 5 MG tablet Take 1 tablet as needed for swelling 30 tablet 0   metoprolol succinate (TOPROL-XL) 50 MG 24 hr tablet Take 1 tablet (50 mg total) by mouth daily. 90  tablet 3   Multiple Vitamins-Minerals (HAIR/SKIN/NAILS/BIOTIN) TABS Take 1 tablet by mouth daily.     Multiple Vitamins-Minerals (MULTIVITAMIN PO) Take 1 tablet by mouth daily.     Polyethyl Glycol-Propyl Glycol (SYSTANE OP) Place 1 drop into both eyes daily.     polyethylene glycol (MIRALAX / GLYCOLAX) 17 g packet Take 17 g by mouth 2 (two) times daily. 28 packet 0   potassium chloride SA (K-DUR,KLOR-CON) 20 MEQ tablet Take 1 tablet (20 mEq total) by mouth daily after supper. 90 tablet 0   rosuvastatin (CRESTOR) 5 MG tablet Take 1 tablet (5 mg total) by mouth daily. 90 tablet 1   sertraline (ZOLOFT) 100 MG tablet Take 50 mg by mouth at  bedtime.     spironolactone (ALDACTONE) 25 MG tablet Take 0.5 tablets (12.5 mg total) by mouth daily. 45 tablet 3   trimethoprim (TRIMPEX) 100 MG tablet Take 100 mg by mouth daily.     vitamin C (ASCORBIC ACID) 500 MG tablet Take 500 mg by mouth in the morning and at bedtime.     Vitamin E 180 MG (400 UNIT) CAPS Take 400 Units by mouth daily.     No current facility-administered medications for this visit.   Allergies:  Lipitor [atorvastatin], Morphine, Penicillins, and Reclast [zoledronic acid]   Social History: The patient  reports that she has never smoked. She has never used smokeless tobacco. She reports that she does not drink alcohol and does not use drugs.   Family History: The patient's family history includes Cirrhosis in her father; Diabetes in her father and another family member; Heart Problems in her mother.   ROS:  Please see the history of present illness. Otherwise, complete review of systems is positive for none.  All other systems are reviewed and negative.   Physical Exam: VS:  BP (!) 110/58   Pulse 70   Ht 5\' 4"  (1.626 m)   Wt 134 lb 3.2 oz (60.9 kg)   LMP  (LMP Unknown)   SpO2 97%   BMI 23.04 kg/m , BMI Body mass index is 23.04 kg/m.  Wt Readings from Last 3 Encounters:  12/04/20 134 lb 3.2 oz (60.9 kg)  11/08/20 131 lb 6.4 oz  (59.6 kg)  06/26/20 134 lb (60.8 kg)    General: Patient appears comfortable at rest. Neck: Supple, no elevated JVP or carotid bruits, no thyromegaly. Lungs: Clear to auscultation, nonlabored breathing at rest. Cardiac: Regular rate and rhythm, no S3 or significant systolic murmur, no pericardial rub. Extremities: No pitting edema, distal pulses 2+. Skin: Warm and dry. Musculoskeletal: No kyphosis. Neuropsychiatric: Alert and oriented x3, affect grossly appropriate.  ECG:  EKG on 11/06/2020 at [facility] emergency department normal sinus rhythm with left bundle branch block rate of 73  Recent Labwork: 11/06/2020: Magnesium 1.8 11/07/2020: Hemoglobin 10.8; Platelets 205 11/08/2020: BUN 23; Creatinine, Ser 1.06; Potassium 4.2; Sodium 136     Component Value Date/Time   CHOL 134 11/07/2020 0540   TRIG 93 11/07/2020 0540   HDL 37 (L) 11/07/2020 0540   CHOLHDL 3.6 11/07/2020 0540   VLDL 19 11/07/2020 0540   LDLCALC 78 11/07/2020 0540    Other Studies Reviewed Today:  Echocardiogram 11/07/2020 LEFT HEART CATH AND CORS/GRAFTS ANGIOGRAPHY    Conclusion      Mid LM lesion is 30% stenosed.   Ost LAD lesion is 25% stenosed.   Prox Cx to Mid Cx lesion is 50% stenosed.   Mid RCA lesion is 100% stenosed.   Origin to Prox Graft lesion is 100% stenosed.   LPAV lesion is 40% stenosed.   1st Mrg lesion is 95% stenosed.   Prox LAD to Mid LAD lesion is 99% stenosed.   Previously placed Ost Cx to Prox Cx stent (unknown type) is  widely patent.   SVG graft was not injected.   LIMA and is normal in caliber.   The graft exhibits no disease.   Severe triple vessel CAD s/p CABG with 1/2 patent bypass grafts Chronic sub-total occlusion of the mid LAD. The mid and distal LAD fills from the patent LIMA graft Patent proximal Circumflex stent. Beyond the stent there is a moderate stenosis in the AV groove Circumflex stent. The moderate caliber obtuse  marginal branch has a long segment of severe  stenosis extending from the ostium into the mid vessel. This has been present since her last cath. The vessel had been bypassed. The vein graft to the obtuse marginal branch is known to be occluded. The obtuse marginal branch is not a favorable target for PCI. The RCA is a small non-dominant vessel. The vessel is chronically occluded in the mid segment. The distal vessel fills from right to right collaterals.   Recommendations: No good options for PCI. I would not approach the OM branch with PCI as this would likely compromise flow into the AV groove Circumflex which supplies a dominant lateral wall system. Continue medical management of CAD.  Diagnostic Dominance: Left     Echocardiogram 11/07/2020  1. Pronounced septal motion consistent with LBBB and postop. Left ventricular ejection fraction, by estimation, is 40 to 45%. The left ventricle has mildly decreased function. The left ventricle has no regional wall motion abnormalities. Left ventricular diastolic parameters are consistent with Grade II diastolic dysfunction (pseudonormalization). 2. Right ventricular systolic function is mildly reduced. The right ventricular size is moderately enlarged. There is normal pulmonary artery systolic pressure. The estimated right ventricular systolic pressure is 28.6 mmHg. 3. Left atrial size was severely dilated. 4. Right atrial size was mildly dilated. 5. The mitral valve is grossly normal. Mild mitral valve regurgitation. No evidence of mitral stenosis. 6. 23 mm bioprosthetic aortic valve. Vmax 2.1 m/s, MG 11 mmHG, EOA 0.89. DI 0.35. Normal prosthesis. The aortic valve has been repaired/replaced. Aortic valve regurgitation is not visualized. 7. The inferior vena cava is normal in size with greater than 50% respiratory variability, suggesting right atrial pressure of 3 mmHg. Comparison(s): No significant change from prior study. EF relatively unchanged.  Assessment and Plan:  1. Unstable  angina pectoris (HCC)   2. CAD in native artery   3. Chronic diastolic heart failure (HCC)   4. Essential hypertension   5. Secondary cardiomyopathy (HCC)    1. Chest pain/unstable angina Recent presentation and admission for chest pain.  Had a subsequent cardiac catheterization.  See report above.  Continued medical management was recommended.  She denies any further chest pain since discharge from hospital.  Daughter states she is extremely susceptible to increased doses of nitrates.  She states in the past she has taken sublingual nitroglycerin with significant precipitous drops in blood pressure.  States she cannot take sublingual nitroglycerin.  2. CAD in native artery Recent cardiac catheterization 11/07/2020.  See report above.  Severe three-vessel disease.  Continue medical management recommended.  Continue aspirin 81 mg daily, Imdur 15 mg p.o. daily, Toprol-XL 50 mg daily, Crestor 5 mg daily.  3. Chronic diastolic heart failure (HCC) She appears euvolemic today.  Continue furosemide 40 mg p.o. twice daily, metolazone 5 mg as needed for swelling, Toprol-XL 50 mg daily, losartan 12.5 mg daily.  Spironolactone 12.5 mg p.o. daily  4. Essential hypertension Blood pressure well controlled today at 110/58.  Continue losartan 12.5 mg p.o. daily.  5. Secondary cardiomyopathy (HCC) Recent echo on 11/07/2020 with EF of 40 to 45% G2 DD, RV moderately enlarged, LA severely dilated, RA mildly dilated, mild MR, 23 mm bioprosthetic aortic valve.   Medication Adjustments/Labs and Tests Ordered: Current medicines are reviewed at length with the patient today.  Concerns regarding medicines are outlined above.   Disposition: Follow-up with Dr. Diona BrownerMcDowell or APP 6 months.  Signed, Rennis HardingAndrew Quinn, NP 12/04/2020 10:37 AM    [facility]  at [place], [place], [place] [address] Phone: [phone]; Fax: [phone]

## 2020-12-04 ENCOUNTER — Encounter: Payer: Self-pay | Admitting: Family Medicine

## 2020-12-04 ENCOUNTER — Other Ambulatory Visit: Payer: Self-pay

## 2020-12-04 ENCOUNTER — Ambulatory Visit: Payer: Medicare Other | Admitting: Family Medicine

## 2020-12-04 DIAGNOSIS — I5032 Chronic diastolic (congestive) heart failure: Secondary | ICD-10-CM | POA: Diagnosis not present

## 2020-12-04 DIAGNOSIS — I110 Hypertensive heart disease with heart failure: Secondary | ICD-10-CM

## 2020-12-04 DIAGNOSIS — I200 Unstable angina: Secondary | ICD-10-CM | POA: Diagnosis not present

## 2020-12-04 DIAGNOSIS — I429 Cardiomyopathy, unspecified: Secondary | ICD-10-CM | POA: Diagnosis not present

## 2020-12-04 DIAGNOSIS — I25110 Atherosclerotic heart disease of native coronary artery with unstable angina pectoris: Secondary | ICD-10-CM | POA: Diagnosis not present

## 2020-12-04 DIAGNOSIS — I10 Essential (primary) hypertension: Secondary | ICD-10-CM

## 2020-12-04 DIAGNOSIS — I2510 Atherosclerotic heart disease of native coronary artery without angina pectoris: Secondary | ICD-10-CM

## 2020-12-04 DIAGNOSIS — R0789 Other chest pain: Secondary | ICD-10-CM

## 2020-12-04 NOTE — Patient Instructions (Signed)

## 2020-12-29 ENCOUNTER — Observation Stay (HOSPITAL_COMMUNITY)
Admission: EM | Admit: 2020-12-29 | Discharge: 2020-12-30 | Disposition: A | Discharge status: Home / Self Care | Payer: Medicare Other | Attending: Family Medicine | Admitting: Family Medicine

## 2020-12-29 ENCOUNTER — Emergency Department (HOSPITAL_COMMUNITY): Payer: Medicare Other

## 2020-12-29 ENCOUNTER — Other Ambulatory Visit: Payer: Self-pay

## 2020-12-29 ENCOUNTER — Encounter (HOSPITAL_COMMUNITY): Payer: Self-pay | Admitting: *Deleted

## 2020-12-29 DIAGNOSIS — Z7984 Long term (current) use of oral hypoglycemic drugs: Secondary | ICD-10-CM | POA: Diagnosis not present

## 2020-12-29 DIAGNOSIS — Z20822 Contact with and (suspected) exposure to covid-19: Secondary | ICD-10-CM | POA: Diagnosis not present

## 2020-12-29 DIAGNOSIS — E782 Mixed hyperlipidemia: Secondary | ICD-10-CM

## 2020-12-29 DIAGNOSIS — Z79899 Other long term (current) drug therapy: Secondary | ICD-10-CM | POA: Diagnosis not present

## 2020-12-29 DIAGNOSIS — Z96653 Presence of artificial knee joint, bilateral: Secondary | ICD-10-CM | POA: Insufficient documentation

## 2020-12-29 DIAGNOSIS — E119 Type 2 diabetes mellitus without complications: Secondary | ICD-10-CM

## 2020-12-29 DIAGNOSIS — Z96643 Presence of artificial hip joint, bilateral: Secondary | ICD-10-CM | POA: Diagnosis not present

## 2020-12-29 DIAGNOSIS — N189 Chronic kidney disease, unspecified: Secondary | ICD-10-CM | POA: Diagnosis not present

## 2020-12-29 DIAGNOSIS — Z951 Presence of aortocoronary bypass graft: Secondary | ICD-10-CM | POA: Diagnosis not present

## 2020-12-29 DIAGNOSIS — I130 Hypertensive heart and chronic kidney disease with heart failure and stage 1 through stage 4 chronic kidney disease, or unspecified chronic kidney disease: Secondary | ICD-10-CM | POA: Insufficient documentation

## 2020-12-29 DIAGNOSIS — I200 Unstable angina: Secondary | ICD-10-CM

## 2020-12-29 DIAGNOSIS — R079 Chest pain, unspecified: Secondary | ICD-10-CM | POA: Diagnosis present

## 2020-12-29 DIAGNOSIS — K219 Gastro-esophageal reflux disease without esophagitis: Secondary | ICD-10-CM | POA: Diagnosis present

## 2020-12-29 DIAGNOSIS — I5042 Chronic combined systolic (congestive) and diastolic (congestive) heart failure: Secondary | ICD-10-CM | POA: Insufficient documentation

## 2020-12-29 DIAGNOSIS — E1122 Type 2 diabetes mellitus with diabetic chronic kidney disease: Secondary | ICD-10-CM | POA: Insufficient documentation

## 2020-12-29 DIAGNOSIS — Z7982 Long term (current) use of aspirin: Secondary | ICD-10-CM | POA: Insufficient documentation

## 2020-12-29 DIAGNOSIS — R0789 Other chest pain: Secondary | ICD-10-CM | POA: Diagnosis not present

## 2020-12-29 DIAGNOSIS — N183 Chronic kidney disease, stage 3 unspecified: Secondary | ICD-10-CM | POA: Diagnosis not present

## 2020-12-29 DIAGNOSIS — E785 Hyperlipidemia, unspecified: Secondary | ICD-10-CM | POA: Diagnosis present

## 2020-12-29 DIAGNOSIS — I25110 Atherosclerotic heart disease of native coronary artery with unstable angina pectoris: Secondary | ICD-10-CM | POA: Diagnosis not present

## 2020-12-29 DIAGNOSIS — I2510 Atherosclerotic heart disease of native coronary artery without angina pectoris: Secondary | ICD-10-CM | POA: Insufficient documentation

## 2020-12-29 DIAGNOSIS — I25119 Atherosclerotic heart disease of native coronary artery with unspecified angina pectoris: Secondary | ICD-10-CM

## 2020-12-29 DIAGNOSIS — E871 Hypo-osmolality and hyponatremia: Secondary | ICD-10-CM

## 2020-12-29 LAB — URINALYSIS, ROUTINE W REFLEX MICROSCOPIC
Bilirubin Urine: NEGATIVE
Glucose, UA: NEGATIVE mg/dL
Ketones, ur: NEGATIVE mg/dL
Nitrite: NEGATIVE
Protein, ur: NEGATIVE mg/dL
Specific Gravity, Urine: <1.005 — ABNORMAL LOW (ref 1.005–1.030)
pH: 6 (ref 5.0–8.0)

## 2020-12-29 LAB — CBC
HCT: 31.1 % — ABNORMAL LOW (ref 36.0–46.0)
Hemoglobin: 10.8 g/dL — ABNORMAL LOW (ref 12.0–15.0)
MCH: 33.5 pg (ref 26.0–34.0)
MCHC: 34.7 g/dL (ref 30.0–36.0)
MCV: 96.6 fL (ref 80.0–100.0)
Platelets: 207 10*3/uL (ref 150–400)
RBC: 3.22 MIL/uL — ABNORMAL LOW (ref 3.87–5.11)
RDW: 11.8 % (ref 11.5–15.5)
WBC: 6.2 10*3/uL (ref 4.0–10.5)
nRBC: 0 % (ref 0.0–0.2)

## 2020-12-29 LAB — COMPREHENSIVE METABOLIC PANEL
ALT: 13 U/L (ref 0–44)
AST: 25 U/L (ref 15–41)
Albumin: 4 g/dL (ref 3.5–5.0)
Alkaline Phosphatase: 60 U/L (ref 38–126)
Anion gap: 9 (ref 5–15)
BUN: 35 mg/dL — ABNORMAL HIGH (ref 8–23)
CO2: 26 mmol/L (ref 22–32)
Calcium: 8.7 mg/dL — ABNORMAL LOW (ref 8.9–10.3)
Chloride: 92 mmol/L — ABNORMAL LOW (ref 98–111)
Creatinine, Ser: 1.03 mg/dL — ABNORMAL HIGH (ref 0.44–1.00)
GFR, Estimated: 51 mL/min — ABNORMAL LOW (ref >60)
Glucose, Bld: 138 mg/dL — ABNORMAL HIGH (ref 70–99)
Potassium: 3.9 mmol/L (ref 3.5–5.1)
Sodium: 127 mmol/L — ABNORMAL LOW (ref 135–145)
Total Bilirubin: 0.7 mg/dL (ref 0.3–1.2)
Total Protein: 7.2 g/dL (ref 6.5–8.1)

## 2020-12-29 LAB — TROPONIN I (HIGH SENSITIVITY)
Troponin I (High Sensitivity): 5 ng/L (ref <18)
Troponin I (High Sensitivity): 12 ng/L (ref <18)
Troponin I (High Sensitivity): 35 ng/L — ABNORMAL HIGH (ref <18)
Troponin I (High Sensitivity): 44 ng/L — ABNORMAL HIGH (ref <18)

## 2020-12-29 LAB — RESP PANEL BY RT-PCR (FLU A&B, COVID) ARPGX2
Influenza A by PCR: NEGATIVE
Influenza B by PCR: NEGATIVE
SARS Coronavirus 2 by RT PCR: NEGATIVE

## 2020-12-29 LAB — URINALYSIS, MICROSCOPIC (REFLEX): Bacteria, UA: NONE SEEN

## 2020-12-29 LAB — GLUCOSE, CAPILLARY: Glucose-Capillary: 110 mg/dL — ABNORMAL HIGH (ref 70–99)

## 2020-12-29 MED ORDER — ASPIRIN EC 81 MG PO TBEC
81.0000 mg | DELAYED_RELEASE_TABLET | Freq: Every day | ORAL | Status: DC
  Administered 2020-12-30: 81 mg via ORAL
  Filled 2020-12-29: qty 1

## 2020-12-29 MED ORDER — ROSUVASTATIN CALCIUM 10 MG PO TABS
5.0000 mg | ORAL_TABLET | Freq: Every day | ORAL | Status: DC
  Administered 2020-12-29: 5 mg via ORAL
  Filled 2020-12-29: qty 1

## 2020-12-29 MED ORDER — LORAZEPAM 2 MG/ML IJ SOLN: 1.0000 mg | Freq: Once | INTRAMUSCULAR | Status: DC

## 2020-12-29 MED ORDER — SODIUM CHLORIDE 0.9 % IV SOLN: INTRAVENOUS | Status: DC

## 2020-12-29 MED ORDER — ONDANSETRON HCL 4 MG/2ML IJ SOLN: 4.0000 mg | Freq: Four times a day (QID) | INTRAMUSCULAR | Status: DC | PRN

## 2020-12-29 MED ORDER — NITROGLYCERIN 0.3 MG SL SUBL
0.3000 mg | SUBLINGUAL_TABLET | Freq: Once | SUBLINGUAL | Status: DC
  Filled 2020-12-29: qty 100

## 2020-12-29 MED ORDER — ONDANSETRON HCL 4 MG/2ML IJ SOLN
4.0000 mg | Freq: Four times a day (QID) | INTRAMUSCULAR | Status: DC | PRN
  Administered 2020-12-29: 4 mg via INTRAVENOUS
  Filled 2020-12-29: qty 2

## 2020-12-29 MED ORDER — FAMOTIDINE 20 MG PO TABS
20.0000 mg | ORAL_TABLET | Freq: Every day | ORAL | Status: DC
  Administered 2020-12-30: 20 mg via ORAL
  Filled 2020-12-29: qty 1

## 2020-12-29 MED ORDER — LEVOTHYROXINE SODIUM 88 MCG PO TABS
88.0000 ug | ORAL_TABLET | Freq: Every day | ORAL | Status: DC
  Administered 2020-12-30: 88 ug via ORAL
  Filled 2020-12-29: qty 1

## 2020-12-29 MED ORDER — SODIUM CHLORIDE 0.9 % IV BOLUS
500.0000 mL | Freq: Once | INTRAVENOUS | Status: AC
  Administered 2020-12-29: 500 mL via INTRAVENOUS

## 2020-12-29 MED ORDER — MORPHINE SULFATE (PF) 2 MG/ML IV SOLN
2.0000 mg | INTRAVENOUS | Status: DC | PRN
Start: 2020-12-29 — End: 2020-12-30
  Administered 2020-12-29: 2 mg via INTRAVENOUS
  Filled 2020-12-29: qty 1

## 2020-12-29 MED ORDER — HEPARIN SODIUM (PORCINE) 5000 UNIT/ML IJ SOLN
5000.0000 [IU] | Freq: Three times a day (TID) | INTRAMUSCULAR | Status: DC
  Administered 2020-12-29 – 2020-12-30 (×2): 5000 [IU] via SUBCUTANEOUS
  Filled 2020-12-29 (×2): qty 1

## 2020-12-29 MED ORDER — METOPROLOL SUCCINATE ER 50 MG PO TB24
50.0000 mg | ORAL_TABLET | Freq: Every day | ORAL | Status: DC
  Filled 2020-12-29: qty 1

## 2020-12-29 MED ORDER — LOSARTAN POTASSIUM 25 MG PO TABS
12.5000 mg | ORAL_TABLET | Freq: Every day | ORAL | Status: DC
  Administered 2020-12-30: 12.5 mg via ORAL
  Filled 2020-12-29: qty 1

## 2020-12-29 MED ORDER — TRIMETHOPRIM 100 MG PO TABS
50.0000 mg | ORAL_TABLET | Freq: Every day | ORAL | Status: DC
  Filled 2020-12-29 (×2): qty 1

## 2020-12-29 MED ORDER — ACETAMINOPHEN 325 MG PO TABS: 650.0000 mg | ORAL_TABLET | ORAL | Status: DC | PRN

## 2020-12-29 MED ORDER — ISOSORBIDE MONONITRATE ER 60 MG PO TB24
30.0000 mg | ORAL_TABLET | Freq: Two times a day (BID) | ORAL | Status: DC
  Administered 2020-12-30: 30 mg via ORAL
  Filled 2020-12-29 (×2): qty 1

## 2020-12-29 MED ORDER — ALUM & MAG HYDROXIDE-SIMETH 200-200-20 MG/5ML PO SUSP
30.0000 mL | Freq: Once | ORAL | Status: AC
  Administered 2020-12-29: 30 mL via ORAL
  Filled 2020-12-29: qty 30

## 2020-12-29 MED ORDER — SPIRONOLACTONE 12.5 MG HALF TABLET
12.5000 mg | ORAL_TABLET | Freq: Every day | ORAL | Status: DC
  Administered 2020-12-30: 12.5 mg via ORAL
  Filled 2020-12-29 (×2): qty 1

## 2020-12-29 MED ORDER — SERTRALINE HCL 50 MG PO TABS
50.0000 mg | ORAL_TABLET | Freq: Every day | ORAL | Status: DC
  Administered 2020-12-29: 50 mg via ORAL
  Filled 2020-12-29: qty 1

## 2020-12-29 NOTE — ED Notes (Signed)
Pt with diarrhea x 2 days as well.  Pt concerned about UTI but denies burning with urination.

## 2020-12-29 NOTE — ED Notes (Addendum)
Zierle-Ghosh, [place] B, DO notified of trop level of 35 and cp not relieved with morphine

## 2020-12-29 NOTE — ED Triage Notes (Signed)
Pt with mid CP x 1 hour, took ASA ?325mg  at home.  Non radiating , refused IV from EMS

## 2020-12-29 NOTE — ED Notes (Signed)
Daughter in room while hospitalist in room with pt

## 2020-12-29 NOTE — H&P (Signed)
TRH H&P    Patient Demographics:    Erin Herman, is a 87 y.o. female  MRN: [id number]  DOB - 1928-03-14  Admit Date - 12/29/2020  Referring MD/NP/PA: Cammy Copa  Outpatient Primary MD for the patient is Kirstie Peri, MD  Patient coming from: home  Chief complaint- Chest pressure   HPI:    Erin Herman  is a 87 y.o. female,with history of anemia, anxiety, CAD s/p CABG, cardiac cath 11/07/20, cardiomyopathy, HTN, GERD, type 2 DMII, and recurrent UTIs presents to the ED with a chief complaint of chest pressure. Patient reports that she was on the phone when she had sudden onset of chest pressure. It was in the center of her chest, and felt similar to her previous MI symptoms. Patient reports that the pain did not radiate. It was not associated with exertion. She had no associated dyspnea, palpitations, nausea, diaphoresis, or nausea.  Patient reports that once the pressure started he remained constant, and did not get worse.  Taking aspirin, and taking Imdur helped to relieve the pain.  Pain is started up again in the ED.  Patient reports that she did have nausea that was not associated with the chest pain. For the last two days patient has had diarrhea and poor PO intake 2/2 poor appetite.  Patient reports that her diarrhea is nonbloody, no melena.  She has not had any stomach cramps.  She has been nauseous but has not had episodes of vomiting.  She does report generalized weakness and fatigue since she has had the diarrhea.  In the ED Temp 97.4, heart rate 72-82, respiratory rate 12-22, blood pressure 103/61, satting at 97% No leukocytosis, hemoglobin 10.8 Chemistry panel reveals a slight hyponatremia that likely secondary to poor p.o. intake BUN is 35, creatinine 1.03 Negative respiratory panel UA is indicative of UTI Chest x-ray shows post CABG chronic bronchitic changes without acute infiltrate 500 mL  bolus Cardiology consulted and recommends observation for trending tropes and inpatient cardiology consult    Review of systems:    In addition to the HPI above,  No Fever-chills, No Headache, No changes with Vision or hearing, No problems swallowing food or Liquids, Complains of chest pressure, but no dyspnea, or cough No Abdominal pain, No Nausea or Vomiting, bowel movements are regular, No Blood in stool or Urine, No dysuria, No new skin rashes or bruises, No new joints pains-aches,  No new weakness, tingling, numbness in any extremity, No recent weight gain or loss, No polyuria, polydypsia or polyphagia, No significant Mental Stressors.  All other systems reviewed and are negative.    Past History of the following :    Past Medical History:  Diagnosis Date   Anemia    iron   Anxiety    Aortic stenosis    Bioprosthetic AVR 2006   CAD (coronary artery disease), native coronary artery    Multivessel status post CABG 2006, occluded SVG to circumflex, DES to circumflex July 2020   Cardiomyopathy [facility])    Chronic kidney disease    Depression  Dysphagia    Essential hypertension    Family history of adverse reaction to anesthesia    N&V   GERD (gastroesophageal reflux disease)    Headache    History of hiatal hernia    HOH (hard of hearing)    LBBB (left bundle branch block)    Mixed hyperlipidemia    NSTEMI (non-ST elevated myocardial infarction) [facility])    July 2020   Osteoarthritis    Postoperative nausea and vomiting    Morphine   Type 2 diabetes mellitus (HCC)    UTI (lower urinary tract infection)       Past Surgical History:  Procedure Laterality Date   ABDOMINAL HYSTERECTOMY     partial   AORTIC VALVE REPLACEMENT  2006   Dr. Cornelius Moras - 23 mm [place] stentless cadaver valve   CARDIAC CATHETERIZATION  2020   with stents   CHOLECYSTECTOMY     CORONARY ARTERY BYPASS GRAFT  2006    Dr. Cornelius Moras - LIMA to LAD, SVG to circumflex   CORONARY STENT INTERVENTION  N/A 11/06/2018   Procedure: CORONARY STENT INTERVENTION;  Surgeon: Yvonne Kendall, MD;  Location: [facility];  Service: Cardiovascular;  Laterality: N/A;   CORONARY/GRAFT ANGIOGRAPHY N/A 11/06/2018   Procedure: CORONARY/GRAFT ANGIOGRAPHY;  Surgeon: Yvonne Kendall, MD;  Location: [facility];  Service: Cardiovascular;  Laterality: N/A;   LEFT HEART CATH AND CORS/GRAFTS ANGIOGRAPHY N/A 11/07/2020   Procedure: LEFT HEART CATH AND CORS/GRAFTS ANGIOGRAPHY;  Surgeon: Kathleene Hazel, MD;  Location: [facility];  Service: Cardiovascular;  Laterality: N/A;   THYROID LOBECTOMY Right    TOTAL HIP ARTHROPLASTY Right 03/01/2017   Procedure: RIGHT TOTAL HIP ARTHROPLASTY ANTERIOR APPROACH;  Surgeon: Durene Romans, MD;  Location: [facility] ORS;  Service: Orthopedics;  Laterality: Right;  70 mins   TOTAL HIP ARTHROPLASTY Left 04/29/2020   Procedure: TOTAL HIP ARTHROPLASTY ANTERIOR APPROACH;  Surgeon: Durene Romans, MD;  Location: [facility] OR[facility];  Service: Orthopedics;  Laterality: Left;  70 mins   TOTAL KNEE ARTHROPLASTY Right 2006   TOTAL KNEE ARTHROPLASTY Left 05/20/2014   DR Thurston Hole   TOTAL KNEE ARTHROPLASTY Left 05/20/2014   Procedure: LEFT TOTAL KNEE ARTHROPLASTY;  Surgeon: Nilda Simmer, MD;  Location: [facility];  Service: Orthopedics;  Laterality: Left;      Social History:      Social History   Tobacco Use   Smoking status: Never   Smokeless tobacco: Never  Substance Use Topics   Alcohol use: No    Alcohol/week: 0.0 standard drinks       Family History :     Family History  Problem Relation Age of Onset   Heart Problems Mother    Diabetes Father    Cirrhosis Father    Diabetes Other       Home Medications:   Prior to Admission medications   Medication Sig Start Date End Date Taking? Authorizing Provider  APPLE CIDER VINEGAR PO Take 1 tablet by mouth every morning.   Yes [provider]  aspirin EC 81 MG tablet Take 1 tablet (81 mg total) by mouth daily.  Swallow whole. 06/26/20  Yes Jonelle Sidle, MD  Biotin w/ Vitamins C & E (HAIR/SKIN/NAILS PO) Take 1 tablet by mouth daily.   Yes [provider]  CINNAMON PO Take 1 tablet by mouth 2 (two) times daily.   Yes [provider]  Coenzyme Q10 (COQ10 PO) Take 1 tablet by mouth daily.   Yes [provider]  CRANBERRY PO Take 1 tablet by mouth daily.   Yes [provider]  Cyanocobalamin (B-12) 2500 MCG TABS Take 2,500 mcg by mouth daily.   Yes [provider]  docusate sodium (COLACE) 100 MG capsule Take 1 capsule (100 mg total) by mouth 2 (two) times daily. 04/30/20  Yes Babish, Molli Hazard, PA-C  famotidine (PEPCID) 20 MG tablet Take 1 tablet (20 mg total) by mouth 2 (two) times daily. 11/07/18  Yes Mikhail, Nita Sells, DO  furosemide (LASIX) 40 MG tablet Take 1 tablet (40 mg total) by mouth 2 (two) times daily. 12/17/19  Yes Jonelle Sidle, MD  isosorbide mononitrate (IMDUR) 30 MG 24 hr tablet Take 0.5 tablets (15 mg total) by mouth 2 (two) times daily. Patient taking differently: Take 15 mg by mouth daily. 11/14/20 02/12/21 Yes Jonelle Sidle, MD  levothyroxine (SYNTHROID) 88 MCG tablet Take 88 mcg by mouth daily before breakfast.   Yes [provider]  losartan (COZAAR) 25 MG tablet Take 0.5 tablets (12.5 mg total) by mouth daily. 05/02/20  Yes Jonelle Sidle, MD  metFORMIN (GLUCOPHAGE) 500 MG tablet Take 1 tablet (500 mg total) by mouth 2 (two) times daily with a meal. 11/10/18  Yes Mikhail, [place], DO  metoprolol succinate (TOPROL-XL) 50 MG 24 hr tablet Take 1 tablet (50 mg total) by mouth daily. 07/14/18  Yes Jonelle Sidle, MD  Multiple Vitamins-Minerals (HAIR/SKIN/NAILS/BIOTIN) TABS Take 1 tablet by mouth daily.   Yes [provider]  Multiple Vitamins-Minerals (MULTIVITAMIN PO) Take 1 tablet by mouth daily.   Yes [provider]  Polyethyl Glycol-Propyl Glycol (SYSTANE OP) Place 1 drop into both eyes daily.   Yes  [provider]  potassium chloride SA (K-DUR,KLOR-CON) 20 MEQ tablet Take 1 tablet (20 mEq total) by mouth daily after supper. 01/26/17  Yes Jonelle Sidle, MD  rosuvastatin (CRESTOR) 5 MG tablet Take 1 tablet (5 mg total) by mouth daily. 07/17/20  Yes Jonelle Sidle, MD  sertraline (ZOLOFT) 100 MG tablet Take 50 mg by mouth at bedtime.   Yes [provider]  spironolactone (ALDACTONE) 25 MG tablet Take 0.5 tablets (12.5 mg total) by mouth daily. 07/06/16  Yes Laqueta Linden, MD  trimethoprim (TRIMPEX) 100 MG tablet Take 50 mg by mouth daily.   Yes [provider]  vitamin C (ASCORBIC ACID) 500 MG tablet Take 500 mg by mouth in the morning and at bedtime.   Yes [provider]  Vitamin E 180 MG (400 UNIT) CAPS Take 400 Units by mouth daily.   Yes [provider]  Cholecalciferol (VITAMIN D3) 125 MCG (5000 UT) CAPS Take 5,000 Units by mouth daily.    [provider]  metolazone (ZAROXOLYN) 5 MG tablet Take 1 tablet as needed for swelling Patient not taking: No sig reported 11/22/17   Jonelle Sidle, MD  polyethylene glycol (MIRALAX / GLYCOLAX) 17 g packet Take 17 g by mouth 2 (two) times daily. Patient not taking: No sig reported 04/30/20   Lanney Gins, PA-C     Allergies:     Allergies  Allergen Reactions   Lipitor [Atorvastatin]     MUSCLE ACHES/FATIGUE   Morphine Nausea And Vomiting   Penicillins Swelling and Other (See Comments)    Tolerated Cephalosporin Date: 04/29/20.  Severe arm swelling and redness Did it involve swelling of the face/tongue/throat, SOB, or low BP? No Did it involve sudden or severe rash/hives, skin peeling, or any reaction on the inside of  your mouth or nose? No Did you need to seek medical attention at a hospital or doctor's office? No When did it last happen?      30 years If all above answers are "NO", may proceed with cephalosporin use.   Reclast [Zoledronic Acid]     "nausea and  vomiting. Could not get out of bed.     Physical Exam:   Vitals  Blood pressure 109/72, pulse 78, temperature (!) 97.4 F (36.3 C), temperature source Oral, resp. rate 14, height  (1.626 m), weight 57.6 kg, SpO2 96 %.  1.  General: Patient lying supine in bed,  no acute distress   2. Psychiatric: Alert and oriented x 3, mood and behavior normal for situation, pleasant and cooperative with exam   3. Neurologic: Speech and language are normal, face is symmetric, moves all 4 extremities voluntarily, at baseline without acute deficits on limited exam   4. HEENMT:  Head is atraumatic, normocephalic, pupils reactive to light, neck is supple, trachea is midline, mucous membranes are moist   5. Respiratory : Lungs are clear to auscultation bilaterally without wheezing, rhonchi, rales, no cyanosis, no increase in work of breathing or accessory muscle use   6. Cardiovascular : Heart rate normal, rhythm is regular, slight murmur, no rubs or gallops, no peripheral edema, peripheral pulses palpated   7. Gastrointestinal:  Abdomen is soft, nondistended, minimally tender to BL lower quadrants without guarding,  bowel sounds active, no masses or organomegaly palpated   8. Skin:  Skin is warm, dry and intact without rashes, acute lesions, or ulcers on limited exam   9.Musculoskeletal:  No acute deformities or trauma, no asymmetry in tone, no peripheral edema, peripheral pulses palpated, no tenderness to palpation in the extremities     Data Review:    CBC Recent Labs  Lab 12/29/20 1427  WBC 6.2  HGB 10.8*  HCT 31.1*  PLT 207  MCV 96.6  MCH 33.5  MCHC 34.7  RDW 11.8   ------------------------------------------------------------------------------------------------------------------  Results for orders placed or performed during the hospital encounter of 12/29/20 (from the past 48 hour(s))  CBC     Status: Abnormal   Collection Time: 12/29/20  2:27 PM  Result Value Ref  Range   WBC 6.2 4.0 - 10.5 K/uL   RBC 3.22 (L) 3.87 - 5.11 MIL/uL   Hemoglobin 10.8 (L) 12.0 - 15.0 g/dL   HCT 37.9 (L) 02.4 - 09.7 %   MCV 96.6 80.0 - 100.0 fL   MCH 33.5 26.0 - 34.0 pg   MCHC 34.7 30.0 - 36.0 g/dL   RDW 35.3 29.9 - 24.2 %   Platelets 207 150 - 400 K/uL   nRBC 0.0 0.0 - 0.2 %    Comment: Performed at [facility], [address] [place], [place] [address]  Troponin I (High Sensitivity)     Status: None   Collection Time: 12/29/20  2:27 PM  Result Value Ref Range   Troponin I (High Sensitivity) 5 <18 ng/L    Comment: (NOTE) Elevated high sensitivity troponin I (hsTnI) values and significant  changes across serial measurements may suggest ACS but many other  chronic and acute conditions are known to elevate hsTnI results.  Refer to the "Links" section for chest pain algorithms and additional  guidance. Performed at [facility], [address] [place], [place] [address]   Comprehensive metabolic panel     Status: Abnormal   Collection Time: 12/29/20  2:27 PM  Result Value Ref  Range   Sodium 127 (L) 135 - 145 mmol/L   Potassium 3.9 3.5 - 5.1 mmol/L   Chloride 92 (L) 98 - 111 mmol/L   CO2 26 22 - 32 mmol/L   Glucose, Bld 138 (H) 70 - 99 mg/dL    Comment: Glucose reference range applies only to samples taken after fasting for at least 8 hours.   BUN 35 (H) 8 - 23 mg/dL   Creatinine, Ser 1.611.03 (H) 0.44 - 1.00 mg/dL   Calcium 8.7 (L) 8.9 - 10.3 mg/dL   Total Protein 7.2 6.5 - 8.1 g/dL   Albumin 4.0 3.5 - 5.0 g/dL   AST 25 15 - 41 U/L   ALT 13 0 - 44 U/L   Alkaline Phosphatase 60 38 - 126 U/L   Total Bilirubin 0.7 0.3 - 1.2 mg/dL   GFR, Estimated 51 (L) >60 mL/min    Comment: (NOTE) Calculated using the CKD-EPI Creatinine Equation (2021)    Anion gap 9 5 - 15    Comment: Performed at [facility], [address] [place], [place] [address]  Resp Panel by RT-PCR (Flu A&B, Covid) Nasopharyngeal Swab     Status: None   Collection Time: 12/29/20  2:48 PM    Specimen: Nasopharyngeal Swab; Nasopharyngeal(NP) swabs in vial transport medium  Result Value Ref Range   SARS Coronavirus 2 by RT PCR NEGATIVE NEGATIVE    Comment: (NOTE) SARS-CoV-2 target nucleic acids are NOT DETECTED.  The SARS-CoV-2 RNA is generally detectable in upper respiratory specimens during the acute phase of infection. The lowest concentration of SARS-CoV-2 viral copies this assay can detect is 138 copies/mL. A negative result does not preclude SARS-Cov-2 infection and should not be used as the sole basis for treatment or other patient management decisions. A negative result may occur with  improper specimen collection/handling, submission of specimen other than nasopharyngeal swab, presence of viral mutation(s) within the areas targeted by this assay, and inadequate number of viral copies(<138 copies/mL). A negative result must be combined with clinical observations, patient history, and epidemiological information. The expected result is Negative.  Fact Sheet for Patients:  BloggerCourse.comhttps://www.fda.gov/media/152166/download  Fact Sheet for Healthcare Providers:  SeriousBroker.ithttps://www.fda.gov/media/152162/download  This test is no t yet approved or cleared by the [place] FDA and  has been authorized for detection and/or diagnosis of SARS-CoV-2 by FDA under an Emergency Use Authorization (EUA). This EUA will remain  in effect (meaning this test can be used) for the duration of the COVID-19 declaration under Section 564(b)(1) of the Act, 21 U.S.C.section 360bbb-3(b)(1), unless the authorization is terminated  or revoked sooner.       Influenza A by PCR NEGATIVE NEGATIVE   Influenza B by PCR NEGATIVE NEGATIVE    Comment: (NOTE) The Xpert Xpress SARS-CoV-2/FLU/RSV plus assay is intended as an aid in the diagnosis of influenza from Nasopharyngeal swab specimens and should not be used as a sole basis for treatment. Nasal washings and aspirates are unacceptable for Xpert Xpress  SARS-CoV-2/FLU/RSV testing.  Fact Sheet for Patients: BloggerCourse.comhttps://www.fda.gov/media/152166/download  Fact Sheet for Healthcare Providers: SeriousBroker.ithttps://www.fda.gov/media/152162/download  This test is not yet approved or cleared by the [place] FDA and has been authorized for detection and/or diagnosis of SARS-CoV-2 by FDA under an Emergency Use Authorization (EUA). This EUA will remain in effect (meaning this test can be used) for the duration of the COVID-19 declaration under Section 564(b)(1) of the Act, 21 U.S.C. section 360bbb-3(b)(1), unless the authorization is terminated or revoked.  Performed at [facility], [address], [place], [place] [address]   Urinalysis, Routine w reflex microscopic Urine, Clean Catch     Status: Abnormal   Collection Time: 12/29/20  4:01 PM  Result Value Ref Range   Color, Urine YELLOW YELLOW   APPearance CLEAR CLEAR   Specific Gravity, Urine <1.005 (L) 1.005 - 1.030   pH 6.0 5.0 - 8.0   Glucose, UA NEGATIVE NEGATIVE mg/dL   Hgb urine dipstick TRACE (A) NEGATIVE   Bilirubin Urine NEGATIVE NEGATIVE   Ketones, ur NEGATIVE NEGATIVE mg/dL   Protein, ur NEGATIVE NEGATIVE mg/dL   Nitrite NEGATIVE NEGATIVE   Leukocytes,Ua LARGE (A) NEGATIVE    Comment: Performed at [facility], [address] [place], [place] [address]  Urinalysis, Microscopic (reflex)     Status: None   Collection Time: 12/29/20  4:01 PM  Result Value Ref Range   RBC / HPF 0-5 0 - 5 RBC/hpf   WBC, UA 21-50 0 - 5 WBC/hpf   Bacteria, UA NONE SEEN NONE SEEN   Squamous Epithelial / LPF 0-5 0 - 5    Comment: Performed at [facility], [address] [place], [place] [address]  Troponin I (High Sensitivity)     Status: None   Collection Time: 12/29/20  4:14 PM  Result Value Ref Range   Troponin I (High Sensitivity) 12 <18 ng/L    Comment: (NOTE) Elevated high sensitivity troponin I (hsTnI) values and significant  changes across serial measurements may suggest ACS but many other   chronic and acute conditions are known to elevate hsTnI results.  Refer to the "Links" section for chest pain algorithms and additional  guidance. Performed at [facility], [address] [place], [place] [address]     Chemistries  Recent Labs  Lab 12/29/20 1427  NA 127*  K 3.9  CL 92*  CO2 26  GLUCOSE 138*  BUN 35*  CREATININE 1.03*  CALCIUM 8.7*  AST 25  ALT 13  ALKPHOS 60  BILITOT 0.7   ------------------------------------------------------------------------------------------------------------------  ------------------------------------------------------------------------------------------------------------------ GFR: Estimated Creatinine Clearance: 30.7 mL/min (A) (by C-G formula based on SCr of 1.03 mg/dL (H)). Liver Function Tests: Recent Labs  Lab 12/29/20 1427  AST 25  ALT 13  ALKPHOS 60  BILITOT 0.7  PROT 7.2  ALBUMIN 4.0   No results for input(s): LIPASE, AMYLASE in the last 168 hours. No results for input(s): AMMONIA in the last 168 hours. Coagulation Profile: No results for input(s): INR, PROTIME in the last 168 hours. Cardiac Enzymes: No results for input(s): CKTOTAL, CKMB, CKMBINDEX, TROPONINI in the last 168 hours. BNP (last 3 results) No results for input(s): PROBNP in the last 8760 hours. HbA1C: No results for input(s): HGBA1C in the last 72 hours. CBG: No results for input(s): GLUCAP in the last 168 hours. Lipid Profile: No results for input(s): CHOL, HDL, LDLCALC, TRIG, CHOLHDL, LDLDIRECT in the last 72 hours. Thyroid Function Tests: No results for input(s): TSH, T4TOTAL, FREET4, T3FREE, THYROIDAB in the last 72 hours. Anemia Panel: No results for input(s): VITAMINB12, FOLATE, FERRITIN, TIBC, IRON, RETICCTPCT in the last 72 hours.  --------------------------------------------------------------------------------------------------------------- Urine analysis:    Component Value Date/Time   COLORURINE YELLOW 12/29/2020 1601    APPEARANCEUR CLEAR 12/29/2020 1601   LABSPEC <1.005 (L) 12/29/2020 1601   PHURINE 6.0 12/29/2020 1601   GLUCOSEU NEGATIVE 12/29/2020 1601   HGBUR TRACE (A) 12/29/2020 1601   BILIRUBINUR NEGATIVE 12/29/2020 1601   KETONESUR NEGATIVE 12/29/2020 1601   PROTEINUR NEGATIVE 12/29/2020 1601   UROBILINOGEN  0.2 05/20/2014 0802   NITRITE NEGATIVE 12/29/2020 1601   LEUKOCYTESUR LARGE (A) 12/29/2020 1601      Imaging Results:    DG Chest Port 1 View  Result Date: 12/29/2020 CLINICAL DATA:  Mid chest pain for 1 hour, diarrhea for 2 days, history coronary artery disease post NSTEMI, cardiomyopathy, hypertension, UTI EXAM: PORTABLE CHEST 1 VIEW COMPARISON:  Portable exam 1433 hours compared to 11/06/2020 FINDINGS: Normal heart size post CABG. Mediastinal contours and pulmonary vascularity normal. Atherosclerotic calcification aorta. Chronic bronchitic changes without pulmonary infiltrate, pleural effusion, or pneumothorax. Bones demineralized. IMPRESSION: Post CABG. Chronic bronchitic changes without acute infiltrate. Aortic Atherosclerosis (ICD10-I70.0). Electronically Signed   By: Ulyses Southward M.D.   On: 12/29/2020 14:54    My personal review of EKG: Rhythm NSR, Rate 92/min, QTc 540 ,no Acute ST changes   Assessment & Plan:    Principal Problem:   Chest pain Active Problems:   Hyperlipidemia   CAD, NATIVE VESSEL   Diabetes (HCC)   GERD (gastroesophageal reflux disease)   Chest pain/ unstable angina Chest pressure Trop normal 5, 12 Third trop pending CXR without acute changes Anxiety is likely contributing Cardiology consulted from ER and rec's observation with IP cards consult in the AM Continue beta blocker, statin, asa, ARB, and  imdur UTI Chronic colonization Bactrim prophylaxis at baseline PCP has advised no treatment unless patient is experiencing dysuria Patient is currently asymptomatic.  Culture ordered - so bacteria will be known if she does become symptomatic Continue to  monitor Dehydration Hx of diarrhea for 2 days No episodes of diarrhea in the ER BUN: Cr 35:1.03 Hypovolemic hyponatremia 127 GFR at baseline bolus in ED Continue IV hydration through the night Hold lasix CAD Continue metoprolol, increase imdur to 30mg  BID, continue statin, continue aspirin, continue losartan IP consult to cardiology for possible medication adjustments Thyroid disease Continue synthroid GERD Continue pepcid DMII Carb modified diet Correction scale   DVT Prophylaxis-   heparin - SCDs   AM Labs Ordered, also please review Full Orders  Family Communication: Admission, patients condition and plan of care including tests being ordered have been discussed with the patient and daughter who indicate understanding and agree with the plan and Code Status.  Code Status:  Full  Admission status: Observation  Time spent in minutes : 65   Ayiana Winslett B Zierle-GhoshDO

## 2020-12-29 NOTE — ED Provider Notes (Signed)
[facility] EMERGENCY DEPARTMENT Provider Note   CSN: [id number] Arrival date & time: 12/29/20  1406     History Chief Complaint  Patient presents with   Chest Pain    Erin Herman is a 87 y.o. female the past medical history of aortic stenosis status post AVR CAD status post CABG, CKD, hypertension, anemia, cardiomyopathy,.  In July 2020 patient was admitted for an NSTEMI, had a cardiac catheterization showed severe three-vessel disease after her CABG.  She had chronic subtotal occlusion of the mid and vein graft and had no options for PCI.  Patient states that she is medically.  Patient presents today with chief complaint of chest pressure.  She states that her       Chest Pain     Past Medical History:  Diagnosis Date   Anemia    iron   Anxiety    Aortic stenosis    Bioprosthetic AVR 2006   CAD (coronary artery disease), native coronary artery    Multivessel status post CABG 2006, occluded SVG to circumflex, DES to circumflex July 2020   Cardiomyopathy [facility])    Chronic kidney disease    Depression    Dysphagia    Essential hypertension    Family history of adverse reaction to anesthesia    N&V   GERD (gastroesophageal reflux disease)    Headache    History of hiatal hernia    HOH (hard of hearing)    LBBB (left bundle branch block)    Mixed hyperlipidemia    NSTEMI (non-ST elevated myocardial infarction) [facility])    July 2020   Osteoarthritis    Postoperative nausea and vomiting    Morphine   Type 2 diabetes mellitus (HCC)    UTI (lower urinary tract infection)     Patient Active Problem List   Diagnosis Date Noted   Chest pain 11/06/2020   Chronic systolic (congestive) heart failure (HCC)    Status post left hip replacement 04/29/2020   NSTEMI (non-ST elevated myocardial infarction) (HCC) 11/04/2018   Dysphagia 05/10/2018   Diabetes (HCC) 02/27/2018   S/P right THA, AA 03/01/2017   Postoperative anemia due to acute blood loss 05/22/2014    DJD (degenerative joint disease) of knee 05/20/2014   Primary localized osteoarthritis of left knee 04/30/2014   Preoperative cardiovascular examination 04/04/2014   Chronic diastolic heart failure (HCC) 07/03/2012   CAROTID ARTERY DISEASE 12/09/2009   Essential hypertension, benign 08/05/2008   CAD, NATIVE VESSEL 08/05/2008   Secondary cardiomyopathy (HCC) 08/05/2008   Aortic valve disorder 08/05/2008   Hyperlipidemia 08/03/2008    Past Surgical History:  Procedure Laterality Date   ABDOMINAL HYSTERECTOMY     partial   AORTIC VALVE REPLACEMENT  2006   Dr. Cornelius Moras - 23 mm [place] stentless cadaver valve   CARDIAC CATHETERIZATION  2020   with stents   CHOLECYSTECTOMY     CORONARY ARTERY BYPASS GRAFT  2006    Dr. Cornelius Moras - LIMA to LAD, SVG to circumflex   CORONARY STENT INTERVENTION N/A 11/06/2018   Procedure: CORONARY STENT INTERVENTION;  Surgeon: Yvonne Kendall, MD;  Location: [facility] INVASIVE CV LAB;  Service: Cardiovascular;  Laterality: N/A;   CORONARY/GRAFT ANGIOGRAPHY N/A 11/06/2018   Procedure: CORONARY/GRAFT ANGIOGRAPHY;  Surgeon: Yvonne Kendall, MD;  Location: [facility] INVASIVE CV LAB;  Service: Cardiovascular;  Laterality: N/A;   LEFT HEART CATH AND CORS/GRAFTS ANGIOGRAPHY N/A 11/07/2020   Procedure: LEFT HEART CATH AND CORS/GRAFTS ANGIOGRAPHY;  Surgeon: Kathleene Hazel, MD;  Location: [facility] INVASIVE  CV LAB;  Service: Cardiovascular;  Laterality: N/A;   THYROID LOBECTOMY Right    TOTAL HIP ARTHROPLASTY Right 03/01/2017   Procedure: RIGHT TOTAL HIP ARTHROPLASTY ANTERIOR APPROACH;  Surgeon: Durene Romanslin, Matthew, MD;  Location: [facility] ORS;  Service: Orthopedics;  Laterality: Right;  70 mins   TOTAL HIP ARTHROPLASTY Left 04/29/2020   Procedure: TOTAL HIP ARTHROPLASTY ANTERIOR APPROACH;  Surgeon: Durene Romanslin, Matthew, MD;  Location: [facility] ORS;  Service: Orthopedics;  Laterality: Left;  70 mins   TOTAL KNEE ARTHROPLASTY Right 2006   TOTAL KNEE ARTHROPLASTY Left 05/20/2014   DR Thurston HoleWAINER   TOTAL KNEE ARTHROPLASTY  Left 05/20/2014   Procedure: LEFT TOTAL KNEE ARTHROPLASTY;  Surgeon: Nilda Simmerobert A Wainer, MD;  [facility];  Service: Orthopedics;  Laterality: Left;     OB History   No obstetric history on file.     Family History  Problem Relation Age of Onset   Heart Problems Mother    Diabetes Father    Cirrhosis Father    Diabetes Other     Social History   Tobacco Use   Smoking status: Never   Smokeless tobacco: Never  Vaping Use   Vaping Use: Never used  Substance Use Topics   Alcohol use: No    Alcohol/week: 0.0 standard drinks   Drug use: No    Home Medications Prior to Admission medications   Medication Sig Start Date End Date Taking? Authorizing Provider  APPLE CIDER VINEGAR PO Take 1 tablet by mouth every morning.    [provider]  aspirin EC 81 MG tablet Take 1 tablet (81 mg total) by mouth daily. Swallow whole. 06/26/20   Jonelle SidleMcDowell, Samuel G, MD  Cholecalciferol (VITAMIN D3) 125 MCG (5000 UT) CAPS Take 5,000 Units by mouth daily.    [provider]  CINNAMON PO Take 1 tablet by mouth 2 (two) times daily.    [provider]  Coenzyme Q10 (COQ10 PO) Take 1 tablet by mouth daily.    [provider]  CRANBERRY PO Take 1 tablet by mouth daily.    [provider]  Cyanocobalamin (B-12) 2500 MCG TABS Take 2,500 mcg by mouth daily.    [provider]  docusate sodium (COLACE) 100 MG capsule Take 1 capsule (100 mg total) by mouth 2 (two) times daily. 04/30/20   Lanney GinsBabish, Matthew, PA-C  famotidine (PEPCID) 20 MG tablet Take 1 tablet (20 mg total) by mouth 2 (two) times daily. 11/07/18   Mikhail, Nita SellsMaryann, DO  furosemide (LASIX) 40 MG tablet Take 1 tablet (40 mg total) by mouth 2 (two) times daily. 12/17/19   Jonelle SidleMcDowell, Samuel G, MD  isosorbide mononitrate (IMDUR) 30 MG 24 hr tablet Take 0.5 tablets (15 mg total) by mouth 2 (two) times daily. Patient taking differently: Take 15 mg by mouth daily. 11/14/20 02/12/21  Jonelle SidleMcDowell, Samuel G, MD   levothyroxine (SYNTHROID) 88 MCG tablet Take 88 mcg by mouth daily before breakfast.    [provider]  losartan (COZAAR) 25 MG tablet Take 0.5 tablets (12.5 mg total) by mouth daily. 05/02/20   Jonelle SidleMcDowell, Samuel G, MD  metFORMIN (GLUCOPHAGE) 500 MG tablet Take 1 tablet (500 mg total) by mouth 2 (two) times daily with a meal. 11/10/18   Edsel PetrinMikhail, Maryann, DO  metolazone (ZAROXOLYN) 5 MG tablet Take 1 tablet as needed for swelling 11/22/17   Jonelle SidleMcDowell, Samuel G, MD  metoprolol succinate (TOPROL-XL) 50 MG 24 hr tablet Take 1 tablet (50 mg total) by mouth daily. 07/14/18   Nona DellMcDowell, Samuel  G, MD  Multiple Vitamins-Minerals (HAIR/SKIN/NAILS/BIOTIN) TABS Take 1 tablet by mouth daily.    [provider]  Multiple Vitamins-Minerals (MULTIVITAMIN PO) Take 1 tablet by mouth daily.    [provider]  Polyethyl Glycol-Propyl Glycol (SYSTANE OP) Place 1 drop into both eyes daily.    [provider]  polyethylene glycol (MIRALAX / GLYCOLAX) 17 g packet Take 17 g by mouth 2 (two) times daily. 04/30/20   Lanney Gins, PA-C  potassium chloride SA (K-DUR,KLOR-CON) 20 MEQ tablet Take 1 tablet (20 mEq total) by mouth daily after supper. 01/26/17   Jonelle Sidle, MD  rosuvastatin (CRESTOR) 5 MG tablet Take 1 tablet (5 mg total) by mouth daily. 07/17/20   Jonelle Sidle, MD  sertraline (ZOLOFT) 100 MG tablet Take 50 mg by mouth at bedtime.    [provider]  spironolactone (ALDACTONE) 25 MG tablet Take 0.5 tablets (12.5 mg total) by mouth daily. 07/06/16   Laqueta Linden, MD  trimethoprim (TRIMPEX) 100 MG tablet Take 100 mg by mouth daily.    [provider]  vitamin C (ASCORBIC ACID) 500 MG tablet Take 500 mg by mouth in the morning and at bedtime.    [provider]  Vitamin E 180 MG (400 UNIT) CAPS Take 400 Units by mouth daily.    [provider]    Allergies    Lipitor [atorvastatin], Morphine, Penicillins, and Reclast [zoledronic  acid]  Review of Systems   Review of Systems  Cardiovascular:  Positive for chest pain.  Ten systems reviewed and are negative for acute change, except as noted in the HPI.   Physical Exam Updated Vital Signs BP 100/62   Pulse 82   Temp (!) 97.4 F (36.3 C) (Oral)   Resp (!) 22   Ht 5\' 4"  (1.626 m)   Wt 57.6 kg   LMP  (LMP Unknown)   SpO2 95%   BMI 21.80 kg/m   Physical Exam Vitals and nursing note reviewed.  Constitutional:      General: She is not in acute distress.    Appearance: She is well-developed. She is not diaphoretic.  HENT:     Head: Normocephalic and atraumatic.     Right Ear: External ear normal.     Left Ear: External ear normal.     Nose: Nose normal.     Mouth/Throat:     Mouth: Mucous membranes are moist.  Eyes:     General: No scleral icterus.    Conjunctiva/sclera: Conjunctivae normal.  Cardiovascular:     Rate and Rhythm: Normal rate and regular rhythm.     Heart sounds: Normal heart sounds. No murmur heard.   No friction rub. No gallop.  Pulmonary:     Effort: Pulmonary effort is normal. No respiratory distress.     Breath sounds: Normal breath sounds.  Abdominal:     General: Bowel sounds are normal. There is no distension.     Palpations: Abdomen is soft. There is no mass.     Tenderness: There is no abdominal tenderness. There is no guarding.  Musculoskeletal:     Cervical back: Normal range of motion.  Skin:    General: Skin is warm and dry.  Neurological:     Mental Status: She is alert and oriented to person, place, and time.  Psychiatric:        Behavior: Behavior normal.    ED Results / Procedures / Treatments   Labs (all labs ordered are listed, but  only abnormal results are displayed) Labs Reviewed  RESP PANEL BY RT-PCR (FLU A&B, COVID) ARPGX2  CBC  COMPREHENSIVE METABOLIC PANEL  URINALYSIS, ROUTINE W REFLEX MICROSCOPIC  TROPONIN I (HIGH SENSITIVITY)    EKG None  Radiology No results  found.  Procedures Procedures   Medications Ordered in ED Medications  sodium chloride 0.9 % bolus 500 mL (500 mLs Intravenous New Bag/Given 12/29/20 1447)    ED Course  I have reviewed the triage vital signs and the nursing notes.  Pertinent labs & imaging results that were available during my care of the patient were reviewed by me and considered in my medical decision making (see chart for details).    MDM Rules/Calculators/A&P                         87 year old female here with high risk given history of medically managed severe CAD.I ordered and reviewed labs which shows initially elevated troponin from 5 up to 12 with only 1 hour of onset prior to arrival, CBC shows no significant abnormality, mild normocytic anemia.  CMP with hyponatremia likely secondary to her diarrhea as well as chronic renal insufficiency.  Urinalysis without signs of infection, respiratory panel is negative for coronavirus and flu. I ordered and reviewed images of a 1 view chest x-ray which showed no acute abnormalities.  EKG showed normal sinus rhythm with intraventricular conduction delay.  EKG is currently unchanged.  Patient has noticed urinary changes and urine does appear to be infected. COVID panel negative for COVID or influenza.  Case discussed with Dr. Loleta Dicker who recommends observation.  He states that she is otherwise fairly well maximized on her medical therapy and there is not much more they can do however does suggest that they could add Ranexa. Case discussed with Dr. Carren Rang who will admit the patient to the hospitalist service.    Final Clinical Impression(s) / ED Diagnoses Final diagnoses:  None    Rx / DC Orders ED Discharge Orders     None        Arthor Captain, PA-C 12/31/20 1003    Long, Arlyss Repress, MD 01/11/21 1746

## 2020-12-30 DIAGNOSIS — N183 Chronic kidney disease, stage 3 unspecified: Secondary | ICD-10-CM

## 2020-12-30 DIAGNOSIS — R079 Chest pain, unspecified: Secondary | ICD-10-CM

## 2020-12-30 DIAGNOSIS — K219 Gastro-esophageal reflux disease without esophagitis: Secondary | ICD-10-CM

## 2020-12-30 DIAGNOSIS — E782 Mixed hyperlipidemia: Secondary | ICD-10-CM

## 2020-12-30 DIAGNOSIS — I25110 Atherosclerotic heart disease of native coronary artery with unstable angina pectoris: Secondary | ICD-10-CM

## 2020-12-30 DIAGNOSIS — E1122 Type 2 diabetes mellitus with diabetic chronic kidney disease: Secondary | ICD-10-CM | POA: Diagnosis not present

## 2020-12-30 DIAGNOSIS — I200 Unstable angina: Secondary | ICD-10-CM | POA: Diagnosis not present

## 2020-12-30 LAB — BASIC METABOLIC PANEL
Anion gap: 9 (ref 5–15)
BUN: 25 mg/dL — ABNORMAL HIGH (ref 8–23)
CO2: 24 mmol/L (ref 22–32)
Calcium: 8.2 mg/dL — ABNORMAL LOW (ref 8.9–10.3)
Chloride: 101 mmol/L (ref 98–111)
Creatinine, Ser: 0.82 mg/dL (ref 0.44–1.00)
GFR, Estimated: >60 mL/min (ref >60)
Glucose, Bld: 95 mg/dL (ref 70–99)
Potassium: 3 mmol/L — ABNORMAL LOW (ref 3.5–5.1)
Sodium: 134 mmol/L — ABNORMAL LOW (ref 135–145)

## 2020-12-30 LAB — LIPID PANEL
Cholesterol: 122 mg/dL (ref 0–200)
HDL: 36 mg/dL — ABNORMAL LOW (ref >40)
LDL Cholesterol: 69 mg/dL (ref 0–99)
Total CHOL/HDL Ratio: 3.4 RATIO
Triglycerides: 83 mg/dL (ref <150)
VLDL: 17 mg/dL (ref 0–40)

## 2020-12-30 LAB — GLUCOSE, CAPILLARY
Glucose-Capillary: 98 mg/dL (ref 70–99)
Glucose-Capillary: 136 mg/dL — ABNORMAL HIGH (ref 70–99)

## 2020-12-30 LAB — TROPONIN I (HIGH SENSITIVITY): Troponin I (High Sensitivity): 24 ng/L — ABNORMAL HIGH (ref <18)

## 2020-12-30 MED ORDER — NITROGLYCERIN 0.4 MG SL SUBL: 0.4000 mg | SUBLINGUAL_TABLET | SUBLINGUAL | Status: DC | PRN

## 2020-12-30 MED ORDER — POTASSIUM CHLORIDE CRYS ER 20 MEQ PO TBCR
40.0000 meq | EXTENDED_RELEASE_TABLET | Freq: Once | ORAL | Status: AC
  Administered 2020-12-30: 40 meq via ORAL
  Filled 2020-12-30: qty 2

## 2020-12-30 MED ORDER — ISOSORBIDE MONONITRATE ER 30 MG PO TB24: 15.0000 mg | ORAL_TABLET | Freq: Every day | ORAL | Status: DC

## 2020-12-30 NOTE — Clinical Social Work Note (Signed)
Spoke with patient's daughter, [name], who was at bedside. Discussed HH options. [name] discussed HH with patient. Patient is not interested in [facility] services at this time.    Imogine Carvell, Juleen China, LCSW

## 2020-12-30 NOTE — Consult Note (Addendum)
Cardiology Consultation:   Patient ID: Erin Herman MRN: [id number]; DOB: 1928/03/25  Admit date: 12/29/2020 Date of Consult: 12/30/2020  PCP:  Kirstie Peri, MD   [facility] Providers Cardiologist:  Nona Dell, MD        Patient Profile:   Erin Herman is a 87 y.o. female with a hx of CAD who is being seen 12/30/2020 for the evaluation of chest pain at the request of Dr. Laural Benes.  History of Present Illness:   Erin Herman is a 87 year old female patient with history of CABG 2006, DES to the proximal circumflex 2020, bioprosthetic AVR 2006, ischemic cardiomyopathy, CKD, hypertension, LBBB.  Patient had NSTEMI 10/2020 and underwent cardiac catheterization that showed anatomy was unchanged she does have a 95% OM1 lesion not amendable to PCI.  Low-dose Imdur added.  Echo LVEF stable at 40 to 45%.  Patient presented to the ER yesterday with recurrent chest pressure while talking on the phone to her daughter about her 18 yr old husband who was being discharged from hospital with GI illness. She was a bit anxious. She didn't feel well for past 2 days with nausea and diarrhea. Didn't eat or take meds yest. Chest tightness without radiation felt similar to her previous MI symptoms.  Troponin slightly elevated and peaked at 45 but flat. No further chest pain and diarrhea has resolved. Hasn't had any exertional symptoms.   Past Medical History:  Diagnosis Date   Anemia    iron   Anxiety    Aortic stenosis    Bioprosthetic AVR 2006   CAD (coronary artery disease), native coronary artery    Multivessel status post CABG 2006, occluded SVG to circumflex, DES to circumflex July 2020   Cardiomyopathy [facility])    Chronic kidney disease    Depression    Dysphagia    Essential hypertension    Family history of adverse reaction to anesthesia    N&V   GERD (gastroesophageal reflux disease)    Headache    History of hiatal hernia    HOH (hard of hearing)    LBBB (left bundle  branch block)    Mixed hyperlipidemia    NSTEMI (non-ST elevated myocardial infarction) [facility])    July 2020   Osteoarthritis    Postoperative nausea and vomiting    Morphine   Type 2 diabetes mellitus (HCC)    UTI (lower urinary tract infection)     Past Surgical History:  Procedure Laterality Date   ABDOMINAL HYSTERECTOMY     partial   AORTIC VALVE REPLACEMENT  2006   Dr. Cornelius Moras - 23 mm [place] stentless cadaver valve   CARDIAC CATHETERIZATION  2020   with stents   CHOLECYSTECTOMY     CORONARY ARTERY BYPASS GRAFT  2006    Dr. Cornelius Moras - LIMA to LAD, SVG to circumflex   CORONARY STENT INTERVENTION N/A 11/06/2018   Procedure: CORONARY STENT INTERVENTION;  Surgeon: Yvonne Kendall, MD;  Location: [facility] INVASIVE CV LAB;  Service: Cardiovascular;  Laterality: N/A;   CORONARY/GRAFT ANGIOGRAPHY N/A 11/06/2018   Procedure: CORONARY/GRAFT ANGIOGRAPHY;  Surgeon: Yvonne Kendall, MD;  Location: [facility] INVASIVE CV LAB;  Service: Cardiovascular;  Laterality: N/A;   LEFT HEART CATH AND CORS/GRAFTS ANGIOGRAPHY N/A 11/07/2020   Procedure: LEFT HEART CATH AND CORS/GRAFTS ANGIOGRAPHY;  Surgeon: Kathleene Hazel, MD;  Location: [facility] INVASIVE CV LAB;  Service: Cardiovascular;  Laterality: N/A;   THYROID LOBECTOMY Right    TOTAL HIP ARTHROPLASTY Right 03/01/2017   Procedure: RIGHT TOTAL HIP ARTHROPLASTY ANTERIOR APPROACH;  Surgeon: Durene Romanslin, Matthew, MD;  Location: [facility] ORS;  Service: Orthopedics;  Laterality: Right;  70 mins   TOTAL HIP ARTHROPLASTY Left 04/29/2020   Procedure: TOTAL HIP ARTHROPLASTY ANTERIOR APPROACH;  Surgeon: Durene Romanslin, Matthew, MD;  Location: [facility] ORS;  Service: Orthopedics;  Laterality: Left;  70 mins   TOTAL KNEE ARTHROPLASTY Right 2006   TOTAL KNEE ARTHROPLASTY Left 05/20/2014   DR Thurston HoleWAINER   TOTAL KNEE ARTHROPLASTY Left 05/20/2014   Procedure: LEFT TOTAL KNEE ARTHROPLASTY;  Surgeon: Nilda Simmerobert A Wainer, MD;  [facility];  Service: Orthopedics;  Laterality: Left;     Home Medications:  Prior to  Admission medications   Medication Sig Start Date End Date Taking? Authorizing Provider  APPLE CIDER VINEGAR PO Take 1 tablet by mouth every morning.   Yes [provider]  aspirin EC 81 MG tablet Take 1 tablet (81 mg total) by mouth daily. Swallow whole. 06/26/20  Yes Jonelle SidleMcDowell, Samuel G, MD  Biotin w/ Vitamins C & E (HAIR/SKIN/NAILS PO) Take 1 tablet by mouth daily.   Yes [provider]  CINNAMON PO Take 1 tablet by mouth 2 (two) times daily.   Yes [provider]  Coenzyme Q10 (COQ10 PO) Take 1 tablet by mouth daily.   Yes [provider]  CRANBERRY PO Take 1 tablet by mouth daily.   Yes [provider]  Cyanocobalamin (B-12) 2500 MCG TABS Take 2,500 mcg by mouth daily.   Yes [provider]  docusate sodium (COLACE) 100 MG capsule Take 1 capsule (100 mg total) by mouth 2 (two) times daily. 04/30/20  Yes Babish, Molli HazardMatthew, PA-C  famotidine (PEPCID) 20 MG tablet Take 1 tablet (20 mg total) by mouth 2 (two) times daily. 11/07/18  Yes Mikhail, Nita SellsMaryann, DO  furosemide (LASIX) 40 MG tablet Take 1 tablet (40 mg total) by mouth 2 (two) times daily. 12/17/19  Yes Jonelle SidleMcDowell, Samuel G, MD  isosorbide mononitrate (IMDUR) 30 MG 24 hr tablet Take 0.5 tablets (15 mg total) by mouth 2 (two) times daily. Patient taking differently: Take 15 mg by mouth daily. 11/14/20 02/12/21 Yes Jonelle SidleMcDowell, Samuel G, MD  levothyroxine (SYNTHROID) 88 MCG tablet Take 88 mcg by mouth daily before breakfast.   Yes [provider]  losartan (COZAAR) 25 MG tablet Take 0.5 tablets (12.5 mg total) by mouth daily. 05/02/20  Yes Jonelle SidleMcDowell, Samuel G, MD  metFORMIN (GLUCOPHAGE) 500 MG tablet Take 1 tablet (500 mg total) by mouth 2 (two) times daily with a meal. 11/10/18  Yes Mikhail, [place] ShoresMaryann, DO  metoprolol succinate (TOPROL-XL) 50 MG 24 hr tablet Take 1 tablet (50 mg total) by mouth daily. 07/14/18  Yes Jonelle SidleMcDowell, Samuel G, MD  Multiple Vitamins-Minerals (HAIR/SKIN/NAILS/BIOTIN) TABS Take 1  tablet by mouth daily.   Yes [provider]  Multiple Vitamins-Minerals (MULTIVITAMIN PO) Take 1 tablet by mouth daily.   Yes [provider]  Polyethyl Glycol-Propyl Glycol (SYSTANE OP) Place 1 drop into both eyes daily.   Yes [provider]  potassium chloride SA (K-DUR,KLOR-CON) 20 MEQ tablet Take 1 tablet (20 mEq total) by mouth daily after supper. 01/26/17  Yes Jonelle SidleMcDowell, Samuel G, MD  rosuvastatin (CRESTOR) 5 MG tablet Take 1 tablet (5 mg total) by mouth daily. 07/17/20  Yes Jonelle SidleMcDowell, Samuel G, MD  sertraline (ZOLOFT) 100 MG tablet Take 50 mg by mouth at bedtime.   Yes [provider]  spironolactone (ALDACTONE) 25 MG tablet Take 0.5 tablets (12.5 mg total) by mouth daily. 07/06/16  Yes Laqueta LindenKoneswaran, Suresh A, MD  trimethoprim (TRIMPEX) 100 MG tablet Take 50 mg by mouth daily.   Yes [provider]  vitamin C (ASCORBIC ACID) 500 MG tablet Take 500 mg by mouth in the morning and at bedtime.   Yes [provider]  Vitamin E 180 MG (400 UNIT) CAPS Take 400 Units by mouth daily.   Yes [provider]  Cholecalciferol (VITAMIN D3) 125 MCG (5000 UT) CAPS Take 5,000 Units by mouth daily.    [provider]  metolazone (ZAROXOLYN) 5 MG tablet Take 1 tablet as needed for swelling Patient not taking: No sig reported 11/22/17   Jonelle Sidle, MD  polyethylene glycol (MIRALAX / GLYCOLAX) 17 g packet Take 17 g by mouth 2 (two) times daily. Patient not taking: No sig reported 04/30/20   Lanney Gins, PA-C    Inpatient Medications: Scheduled Meds:  aspirin EC  81 mg Oral Daily   famotidine  20 mg Oral Daily   heparin  5,000 Units Subcutaneous Q8H   isosorbide mononitrate  30 mg Oral BID   levothyroxine  88 mcg Oral QAC breakfast   LORazepam  1 mg Intravenous Once   losartan  12.5 mg Oral Daily   metoprolol succinate  50 mg Oral Daily   rosuvastatin  5 mg Oral q1800   sertraline  50 mg Oral QHS   spironolactone  12.5 mg Oral  Daily   trimethoprim  50 mg Oral Daily   Continuous Infusions:  sodium chloride 100 mL/hr at 12/29/20 2257   PRN Meds: acetaminophen, morphine injection, nitroGLYCERIN, ondansetron (ZOFRAN) IV, ondansetron (ZOFRAN) IV  Allergies:    Allergies  Allergen Reactions   Lipitor [Atorvastatin]     MUSCLE ACHES/FATIGUE   Morphine Nausea And Vomiting   Penicillins Swelling and Other (See Comments)    Tolerated Cephalosporin Date: 04/29/20.  Severe arm swelling and redness Did it involve swelling of the face/tongue/throat, SOB, or low BP? No Did it involve sudden or severe rash/hives, skin peeling, or any reaction on the inside of your mouth or nose? No Did you need to seek medical attention at a hospital or doctor's office? No When did it last happen?      30 years If all above answers are "NO", may proceed with cephalosporin use.   Reclast [Zoledronic Acid]     "nausea and vomiting. Could not get out of bed.    Social History:   Social History   Socioeconomic History   Marital status: Married    Spouse name: Not on file   Number of children: Not on file   Years of education: Not on file   Highest education level: Not on file  Occupational History   Not on file  Tobacco Use   Smoking status: Never   Smokeless tobacco: Never  Vaping Use   Vaping Use: Never used  Substance and Sexual Activity   Alcohol use: No    Alcohol/week: 0.0 standard drinks   Drug use: No   Sexual activity: Not Currently  Other Topics Concern   Not on file  Social History Narrative   Not on file   Social Determinants of Health   Financial Resource Strain: Not on file  Food Insecurity: Not on file  Transportation Needs: Not on file  Physical Activity: Not on file  Stress: Not on file  Social Connections: Not on file  Intimate Partner Violence: Not on file    Family History:     Family History  Problem Relation Age of Onset  Heart Problems Mother    Diabetes Father    Cirrhosis Father     Diabetes Other      ROS:  Please see the history of present illness.   Review of Systems  Constitutional: Negative.  HENT: Negative.    Eyes: Negative.   Cardiovascular:  Positive for chest pain.  Respiratory: Negative.    Hematologic/Lymphatic: Negative.   Musculoskeletal:  Positive for stiffness. Negative for joint pain.  Gastrointestinal:  Positive for diarrhea and nausea.  Genitourinary: Negative.   Neurological:  Positive for weakness.   All other ROS reviewed and negative.     Physical Exam/Data:   Vitals:   12/29/20 2125 12/29/20 2253 12/30/20 0001 12/30/20 0412  BP: (!) 92/58 94/63 111/60 119/63  Pulse: 77 75 75 78  Resp: 19  19 19   Temp: 97.9 F (36.6 C)  97.9 F (36.6 C) (!) 97.4 F (36.3 C)  TempSrc: Oral  Oral Oral  SpO2: 97%  96% 97%  Weight:      Height:        Intake/Output Summary (Last 24 hours) at 12/30/2020 0831 Last data filed at 12/30/2020 0600 Gross per 24 hour  Intake 1420.63 ml  Output 400 ml  Net 1020.63 ml   Last 3 Weights 12/29/2020 12/04/2020 11/08/2020  Weight (lbs) 127 lb 134 lb 3.2 oz 131 lb 6.4 oz  Weight (kg) 57.607 kg 60.873 kg 59.603 kg     Body mass index is 21.8 kg/m.  General:  Thin, in no acute distress  HEENT: normal Lymph: no adenopathy Neck: no JVD Endocrine:  No thryomegaly Vascular: No carotid bruits; FA pulses 2+ bilaterally without bruits  Cardiac:  normal S1, S2; RRR; 2/6 systolic murmur LSB Lungs:  clear to auscultation bilaterally, no wheezing, rhonchi or rales  Abd: soft, nontender, no hepatomegaly  Ext: no edema Musculoskeletal:  No deformities, BUE and BLE strength normal and equal Skin: warm and dry  Neuro:  CNs 2-12 intact, no focal abnormalities noted Psych:  Normal affect   EKG:  The EKG was personally reviewed and demonstrates:  NSR with LBBB unchanged Telemetry:  Telemetry was personally reviewed and demonstrates:  NSR with first degree AV block, PVC's  Relevant CV Studies: TTE 2020-12-07  1.  Pronounced septal motion consistent with LBBB and postop. Left  ventricular ejection fraction, by estimation, is 40 to 45%. The left  ventricle has mildly decreased function. The left ventricle has no  regional wall motion abnormalities. Left  ventricular diastolic parameters are consistent with Grade II diastolic  dysfunction (pseudonormalization).   2. Right ventricular systolic function is mildly reduced. The right  ventricular size is moderately enlarged. There is normal pulmonary artery  systolic pressure. The estimated right ventricular systolic pressure is  28.6 mmHg.   3. Left atrial size was severely dilated.   4. Right atrial size was mildly dilated.   5. The mitral valve is grossly normal. Mild mitral valve regurgitation.  No evidence of mitral stenosis.   6. 23 mm bioprosthetic aortic valve. Vmax 2.1 m/s, MG 11 mmHG, EOA 0.89.  DI 0.35. Normal prosthesis. The aortic valve has been repaired/replaced.  Aortic valve regurgitation is not visualized.   7. The inferior vena cava is normal in size with greater than 50%  respiratory variability, suggesting right atrial pressure of 3 mmHg.   Comparison(s): No significant change from prior study. EF relatively  unchanged.    LHC 2020-12-07   Severe triple vessel CAD s/p CABG with 1/2 patent bypass grafts  Chronic sub-total occlusion of the mid LAD. The mid and distal LAD fills from the patent LIMA graft Patent proximal Circumflex stent. Beyond the stent there is a moderate stenosis in the AV groove Circumflex stent. The moderate caliber obtuse marginal branch has a long segment of severe stenosis extending from the ostium into the mid vessel. This has been present since her last cath. The vessel had been bypassed. The vein graft to the obtuse marginal branch is known to be occluded. The obtuse marginal branch is not a favorable target for PCI. The RCA is a small non-dominant vessel. The vessel is chronically occluded in the mid segment.  The distal vessel fills from right to right collaterals.   Recommendations: No good options for PCI. I would not approach the OM branch with PCI as this would likely compromise flow into the AV groove Circumflex which supplies a dominant lateral wall system. Continue medical management of CAD.  Laboratory Data:  High Sensitivity Troponin:   Recent Labs  Lab 12/29/20 1427 12/29/20 1614 12/29/20 1927 12/29/20 2124 12/30/20 0536  TROPONINIHS 5 12 35* 44* 24*     Chemistry Recent Labs  Lab 12/29/20 1427  NA 127*  K 3.9  CL 92*  CO2 26  GLUCOSE 138*  BUN 35*  CREATININE 1.03*  CALCIUM 8.7*  GFRNONAA 51*  ANIONGAP 9    Recent Labs  Lab 12/29/20 1427  PROT 7.2  ALBUMIN 4.0  AST 25  ALT 13  ALKPHOS 60  BILITOT 0.7   Hematology Recent Labs  Lab 12/29/20 1427  WBC 6.2  RBC 3.22*  HGB 10.8*  HCT 31.1*  MCV 96.6  MCH 33.5  MCHC 34.7  RDW 11.8  PLT 207   BNPNo results for input(s): BNP, PROBNP in the last 168 hours.  DDimer No results for input(s): DDIMER in the last 168 hours.   Radiology/Studies:  DG Chest Port 1 View  Result Date: 12/29/2020 CLINICAL DATA:  Mid chest pain for 1 hour, diarrhea for 2 days, history coronary artery disease post NSTEMI, cardiomyopathy, hypertension, UTI EXAM: PORTABLE CHEST 1 VIEW COMPARISON:  Portable exam 1433 hours compared to 11/06/2020 FINDINGS: Normal heart size post CABG. Mediastinal contours and pulmonary vascularity normal. Atherosclerotic calcification aorta. Chronic bronchitic changes without pulmonary infiltrate, pleural effusion, or pneumothorax. Bones demineralized. IMPRESSION: Post CABG. Chronic bronchitic changes without acute infiltrate. Aortic Atherosclerosis (ICD10-I70.0). Electronically Signed   By: Ulyses Southward M.D.   On: 12/29/2020 14:54     Assessment and Plan:   Unstable angina with history of prior CABG, NSTEMI 10/2020 left heart cath anatomy unchanged LIMA to the LAD patent, SVG to circumflex occluded, stent  is patent in the proximal circumflex.  95% OM 1 lesion not amendable to PCI medical management recommended.  Sent home on low-dose Imdur.  LVEF 40 to 45% stable. Patient had missed her meds yesterday with nausea and diarrhea. No further pain since here. Would continue current medical therapy. Can't titrate up because of low BP  Ischemic cardiomyopathy EF 40 to 45% on echo 10/2020 on low-dose losartan, Aldactone and metoprolol-compensated  LBBB  CKD Crt 1.03  Hypertension-BP runs low  Hyponatremia/dehydration-2 days of nausea, diarrhea, poor oral intake. On IV fluids per primary team   Risk Assessment/Risk Scores:     TIMI Risk Score for Unstable Angina or Non-ST Elevation MI:   The patient's TIMI risk score is 6, which indicates a 41% risk of all cause mortality, new or recurrent myocardial infarction or need for urgent revascularization  in the next 14 days.          For questions or updates, please contact [facility] Please consult [email or web address] for contact info under    Signed, Jacolyn Reedy, PA-C  12/30/2020 8:31 AM  Pt seen and examined   I agree with findings as noted by Leda Gauze above   Pt is a 87 yo hx of severe CAD    Recent admit with cath as noted above   Severe dz   Not amenable for intervention   Plan for interventon  Pt had recurrence of pain in setting og GI illness and husband being ill   Admitted   Trivial bump in troponin   44 peak    labs consistent with mild dehydration (sl increase in BUN)  Wsas give IV fluids  Currrently feels good    Neck:  JVP is normal Lungs are CTA    Cardiac exam   RRR   No S3    Ext are without edema  I would keep on pts home meds and follow   Told daughter to watch BP   Keep greater than 100   If runs lower may have to adjust meds      Would ambulate some   If feels OK then d/c home    WIll make sure she has follow up in clinic    (sooner than Feb 2023)    Dietrich Pates MD

## 2020-12-30 NOTE — Discharge Instructions (Signed)

## 2020-12-30 NOTE — Discharge Summary (Signed)
Physician Discharge Summary  Erin Herman EGB:[id number] DOB: 1927-08-13 DOA: 12/29/2020  PCP: Kirstie Peri, MD Cardiologist: Kerry Kass date: 12/29/2020 Discharge date: 12/30/2020  Admitted From:  Home  Disposition: Home with HH   Recommendations for Outpatient Follow-up:  Follow up with PCP in 1 weeks Follow up with cardiology in 2-3 weeks   Home Health:  PT, RN  Discharge Condition: STABLE   CODE STATUS: FULL DIET: Heart Healthy    Brief Hospitalization Summary: Please see all hospital notes, images, labs for full details of the hospitalization. ADMISSION HPI:  87 y.o. female,with history of anemia, anxiety, CAD s/p CABG, cardiac cath 11/07/20, cardiomyopathy, HTN, GERD, type 2 DMII, and recurrent UTIs presents to the ED with a chief complaint of chest pressure. Patient reports that she was on the phone when she had sudden onset of chest pressure. It was in the center of her chest, and felt similar to her previous MI symptoms. Patient reports that the pain did not radiate. It was not associated with exertion. She had no associated dyspnea, palpitations, nausea, diaphoresis, or nausea.  Patient reports that once the pressure started he remained constant, and did not get worse.  Taking aspirin, and taking Imdur helped to relieve the pain.  Pain is started up again in the ED.  Patient reports that she did have nausea that was not associated with the chest pain. For the last two days patient has had diarrhea and poor PO intake 2/2 poor appetite.  Patient reports that her diarrhea is nonbloody, no melena.  She has not had any stomach cramps.  She has been nauseous but has not had episodes of vomiting.  She does report generalized weakness and fatigue since she has had the diarrhea.   In the ED Temp 97.4, heart rate 72-82, respiratory rate 12-22, blood pressure 103/61, satting at 97% No leukocytosis, hemoglobin 10.8 Chemistry panel reveals a slight hyponatremia that likely secondary to  poor p.o. intake BUN is 35, creatinine 1.03 Negative respiratory panel UA is indicative of UTI Chest x-ray shows post CABG chronic bronchitic changes without acute infiltrate 500 mL bolus Cardiology consulted and recommends observation for trending tropes and inpatient cardiology consult  HOSPITAL COURSE   Patient was admitted with symptoms of unstable angina with prior history of CABG and NSTEMI.  She is being managed medically at this time.  Cardiology consulted on her in the hospital and recommended continuing current medical therapy.  Dr. Dietrich Pates consulted on her today prior to discharge and recommended monitoring blood pressure at home with goal of keeping systolic blood pressure greater than 100.  Patient is stable to discharge home.  No changes to our current cardiac medications.  She recommended cardiology follow-up outpatient in the next couple of months.  Patient is feeling better and would like to go home.  Discharge home completed.   Discharge Diagnoses:  Principal Problem:   Chest pain Active Problems:   Hyperlipidemia   CAD, NATIVE VESSEL   Diabetes (HCC)   GERD (gastroesophageal reflux disease)   Discharge Instructions:  Allergies as of 12/30/2020       Reactions   Lipitor [atorvastatin]    MUSCLE ACHES/FATIGUE   Morphine Nausea And Vomiting   Penicillins Swelling, Other (See Comments)   Tolerated Cephalosporin Date: 04/29/20. Severe arm swelling and redness Did it involve swelling of the face/tongue/throat, SOB, or low BP? No Did it involve sudden or severe rash/hives, skin peeling, or any reaction on the inside of your mouth or nose? No Did  you need to seek medical attention at a hospital or doctor's office? No When did it last happen?      30 years If all above answers are "NO", may proceed with cephalosporin use.   Reclast [zoledronic Acid]    "nausea and vomiting. Could not get out of bed.        Medication List     STOP taking these medications     metolazone 5 MG tablet Commonly known as: ZAROXOLYN   polyethylene glycol 17 g packet Commonly known as: MIRALAX / GLYCOLAX       TAKE these medications    APPLE CIDER VINEGAR PO Take 1 tablet by mouth every morning.   aspirin EC 81 MG tablet Take 1 tablet (81 mg total) by mouth daily. Swallow whole.   B-12 2500 MCG Tabs Take 2,500 mcg by mouth daily.   CINNAMON PO Take 1 tablet by mouth 2 (two) times daily.   COQ10 PO Take 1 tablet by mouth daily.   CRANBERRY PO Take 1 tablet by mouth daily.   docusate sodium 100 MG capsule Commonly known as: Colace Take 1 capsule (100 mg total) by mouth 2 (two) times daily.   famotidine 20 MG tablet Commonly known as: PEPCID Take 1 tablet (20 mg total) by mouth 2 (two) times daily.   furosemide 40 MG tablet Commonly known as: LASIX Take 1 tablet (40 mg total) by mouth 2 (two) times daily.   HAIR/SKIN/NAILS PO Take 1 tablet by mouth daily.   Hair/Skin/Nails/Biotin Tabs Take 1 tablet by mouth daily.   isosorbide mononitrate 30 MG 24 hr tablet Commonly known as: IMDUR Take 0.5 tablets (15 mg total) by mouth daily.   levothyroxine 88 MCG tablet Commonly known as: SYNTHROID Take 88 mcg by mouth daily before breakfast.   losartan 25 MG tablet Commonly known as: COZAAR Take 0.5 tablets (12.5 mg total) by mouth daily.   metFORMIN 500 MG tablet Commonly known as: GLUCOPHAGE Take 1 tablet (500 mg total) by mouth 2 (two) times daily with a meal.   metoprolol succinate 50 MG 24 hr tablet Commonly known as: TOPROL-XL Take 1 tablet (50 mg total) by mouth daily.   MULTIVITAMIN PO Take 1 tablet by mouth daily.   potassium chloride SA 20 MEQ tablet Commonly known as: KLOR-CON Take 1 tablet (20 mEq total) by mouth daily after supper.   rosuvastatin 5 MG tablet Commonly known as: CRESTOR Take 1 tablet (5 mg total) by mouth daily.   sertraline 100 MG tablet Commonly known as: ZOLOFT Take 50 mg by mouth at bedtime.    spironolactone 25 MG tablet Commonly known as: ALDACTONE Take 0.5 tablets (12.5 mg total) by mouth daily.   SYSTANE OP Place 1 drop into both eyes daily.   trimethoprim 100 MG tablet Commonly known as: TRIMPEX Take 50 mg by mouth daily.   vitamin C 500 MG tablet Commonly known as: ASCORBIC ACID Take 500 mg by mouth in the morning and at bedtime.   Vitamin D3 125 MCG (5000 UT) Caps Take 5,000 Units by mouth daily.   Vitamin E 180 MG (400 UNIT) Caps Take 400 Units by mouth daily.        Follow-up Information     Kirstie Peri, MD. Schedule an appointment as soon as possible for a visit in 1 week(s).   Specialty: Internal Medicine Why: Hospital Follow Up Contact information: [address]  [place] [address] [phone]         Diona Browner,  Illene Bolus, MD. Schedule an appointment as soon as possible for a visit in 3 week(s).   Specialty: Cardiology Why: Hospital Follow Up Contact information: [address] [place] [address] [phone]                Allergies  Allergen Reactions   Lipitor [Atorvastatin]     MUSCLE ACHES/FATIGUE   Morphine Nausea And Vomiting   Penicillins Swelling and Other (See Comments)    Tolerated Cephalosporin Date: 04/29/20.  Severe arm swelling and redness Did it involve swelling of the face/tongue/throat, SOB, or low BP? No Did it involve sudden or severe rash/hives, skin peeling, or any reaction on the inside of your mouth or nose? No Did you need to seek medical attention at a hospital or doctor's office? No When did it last happen?      30 years If all above answers are "NO", may proceed with cephalosporin use.   Reclast [Zoledronic Acid]     "nausea and vomiting. Could not get out of bed.   Allergies as of 12/30/2020       Reactions   Lipitor [atorvastatin]    MUSCLE ACHES/FATIGUE   Morphine Nausea And Vomiting   Penicillins Swelling, Other (See Comments)   Tolerated Cephalosporin Date: 04/29/20. Severe arm  swelling and redness Did it involve swelling of the face/tongue/throat, SOB, or low BP? No Did it involve sudden or severe rash/hives, skin peeling, or any reaction on the inside of your mouth or nose? No Did you need to seek medical attention at a hospital or doctor's office? No When did it last happen?      30 years If all above answers are "NO", may proceed with cephalosporin use.   Reclast [zoledronic Acid]    "nausea and vomiting. Could not get out of bed.        Medication List     STOP taking these medications    metolazone 5 MG tablet Commonly known as: ZAROXOLYN   polyethylene glycol 17 g packet Commonly known as: MIRALAX / GLYCOLAX       TAKE these medications    APPLE CIDER VINEGAR PO Take 1 tablet by mouth every morning.   aspirin EC 81 MG tablet Take 1 tablet (81 mg total) by mouth daily. Swallow whole.   B-12 2500 MCG Tabs Take 2,500 mcg by mouth daily.   CINNAMON PO Take 1 tablet by mouth 2 (two) times daily.   COQ10 PO Take 1 tablet by mouth daily.   CRANBERRY PO Take 1 tablet by mouth daily.   docusate sodium 100 MG capsule Commonly known as: Colace Take 1 capsule (100 mg total) by mouth 2 (two) times daily.   famotidine 20 MG tablet Commonly known as: PEPCID Take 1 tablet (20 mg total) by mouth 2 (two) times daily.   furosemide 40 MG tablet Commonly known as: LASIX Take 1 tablet (40 mg total) by mouth 2 (two) times daily.   HAIR/SKIN/NAILS PO Take 1 tablet by mouth daily.   Hair/Skin/Nails/Biotin Tabs Take 1 tablet by mouth daily.   isosorbide mononitrate 30 MG 24 hr tablet Commonly known as: IMDUR Take 0.5 tablets (15 mg total) by mouth daily.   levothyroxine 88 MCG tablet Commonly known as: SYNTHROID Take 88 mcg by mouth daily before breakfast.   losartan 25 MG tablet Commonly known as: COZAAR Take 0.5 tablets (12.5 mg total) by mouth daily.   metFORMIN 500 MG tablet Commonly known as: GLUCOPHAGE Take 1 tablet (  500 mg  total) by mouth 2 (two) times daily with a meal.   metoprolol succinate 50 MG 24 hr tablet Commonly known as: TOPROL-XL Take 1 tablet (50 mg total) by mouth daily.   MULTIVITAMIN PO Take 1 tablet by mouth daily.   potassium chloride SA 20 MEQ tablet Commonly known as: KLOR-CON Take 1 tablet (20 mEq total) by mouth daily after supper.   rosuvastatin 5 MG tablet Commonly known as: CRESTOR Take 1 tablet (5 mg total) by mouth daily.   sertraline 100 MG tablet Commonly known as: ZOLOFT Take 50 mg by mouth at bedtime.   spironolactone 25 MG tablet Commonly known as: ALDACTONE Take 0.5 tablets (12.5 mg total) by mouth daily.   SYSTANE OP Place 1 drop into both eyes daily.   trimethoprim 100 MG tablet Commonly known as: TRIMPEX Take 50 mg by mouth daily.   vitamin C 500 MG tablet Commonly known as: ASCORBIC ACID Take 500 mg by mouth in the morning and at bedtime.   Vitamin D3 125 MCG (5000 UT) Caps Take 5,000 Units by mouth daily.   Vitamin E 180 MG (400 UNIT) Caps Take 400 Units by mouth daily.        Procedures/Studies: DG Chest Port 1 View  Result Date: 12/29/2020 CLINICAL DATA:  Mid chest pain for 1 hour, diarrhea for 2 days, history coronary artery disease post NSTEMI, cardiomyopathy, hypertension, UTI EXAM: PORTABLE CHEST 1 VIEW COMPARISON:  Portable exam 1433 hours compared to 11/06/2020 FINDINGS: Normal heart size post CABG. Mediastinal contours and pulmonary vascularity normal. Atherosclerotic calcification aorta. Chronic bronchitic changes without pulmonary infiltrate, pleural effusion, or pneumothorax. Bones demineralized. IMPRESSION: Post CABG. Chronic bronchitic changes without acute infiltrate. Aortic Atherosclerosis (ICD10-I70.0). Electronically Signed   By: Ulyses Southward M.D.   On: 12/29/2020 14:54     Subjective: Pt reporting that she is no longer having any chest pain or chest pressure   Discharge Exam: Vitals:   12/30/20 0412 12/30/20 0941  BP: 119/63  (!) 107/50  Pulse: 78 83  Resp: 19   Temp: (!) 97.4 F (36.3 C) 98.2 F (36.8 C)  SpO2: 97% 96%   Vitals:   12/29/20 2253 12/30/20 0001 12/30/20 0412 12/30/20 0941  BP: 94/63 111/60 119/63 (!) 107/50  Pulse: 75 75 78 83  Resp:  19 19   Temp:  97.9 F (36.6 C) (!) 97.4 F (36.3 C) 98.2 F (36.8 C)  TempSrc:  Oral Oral Oral  SpO2:  96% 97% 96%  Weight:      Height:       General: Pt is alert, awake, not in acute distress Cardiovascular: normal S1/S2 +, no rubs, no gallops Respiratory: CTA bilaterally, no wheezing, no rhonchi Abdominal: Soft, NT, ND, bowel sounds + Extremities: no edema, no cyanosis   The results of significant diagnostics from this hospitalization (including imaging, microbiology, ancillary and laboratory) are listed below for reference.     Microbiology: Recent Results (from the past 240 hour(s))  Resp Panel by RT-PCR (Flu A&B, Covid) Nasopharyngeal Swab     Status: None   Collection Time: 12/29/20  2:48 PM   Specimen: Nasopharyngeal Swab; Nasopharyngeal(NP) swabs in vial transport medium  Result Value Ref Range Status   SARS Coronavirus 2 by RT PCR NEGATIVE NEGATIVE Final    Comment: (NOTE) SARS-CoV-2 target nucleic acids are NOT DETECTED.  The SARS-CoV-2 RNA is generally detectable in upper respiratory specimens during the acute phase of infection. The lowest concentration of SARS-CoV-2 viral copies this  assay can detect is 138 copies/mL. A negative result does not preclude SARS-Cov-2 infection and should not be used as the sole basis for treatment or other patient management decisions. A negative result may occur with  improper specimen collection/handling, submission of specimen other than nasopharyngeal swab, presence of viral mutation(s) within the areas targeted by this assay, and inadequate number of viral copies(<138 copies/mL). A negative result must be combined with clinical observations, patient history, and epidemiological information.  The expected result is Negative.  Fact Sheet for Patients:  BloggerCourse.comhttps://www.fda.gov/media/152166/download  Fact Sheet for Healthcare Providers:  SeriousBroker.ithttps://www.fda.gov/media/152162/download  This test is no t yet approved or cleared by the [place] FDA and  has been authorized for detection and/or diagnosis of SARS-CoV-2 by FDA under an Emergency Use Authorization (EUA). This EUA will remain  in effect (meaning this test can be used) for the duration of the COVID-19 declaration under Section 564(b)(1) of the Act, 21 U.S.C.section 360bbb-3(b)(1), unless the authorization is terminated  or revoked sooner.       Influenza A by PCR NEGATIVE NEGATIVE Final   Influenza B by PCR NEGATIVE NEGATIVE Final    Comment: (NOTE) The Xpert Xpress SARS-CoV-2/FLU/RSV plus assay is intended as an aid in the diagnosis of influenza from Nasopharyngeal swab specimens and should not be used as a sole basis for treatment. Nasal washings and aspirates are unacceptable for Xpert Xpress SARS-CoV-2/FLU/RSV testing.  Fact Sheet for Patients: BloggerCourse.comhttps://www.fda.gov/media/152166/download  Fact Sheet for Healthcare Providers: SeriousBroker.ithttps://www.fda.gov/media/152162/download  This test is not yet approved or cleared by the [place] FDA and has been authorized for detection and/or diagnosis of SARS-CoV-2 by FDA under an Emergency Use Authorization (EUA). This EUA will remain in effect (meaning this test can be used) for the duration of the COVID-19 declaration under Section 564(b)(1) of the Act, 21 U.S.C. section 360bbb-3(b)(1), unless the authorization is terminated or revoked.  Performed at [facility], [address] [place], [place] [id number]      Labs: BNP (last 3 results) No results for input(s): BNP in the last 8760 hours. Basic Metabolic Panel: Recent Labs  Lab 12/29/20 1427 12/30/20 0536  NA 127* 134*  K 3.9 3.0*  CL 92* 101  CO2 26 24  GLUCOSE 138* 95  BUN 35* 25*  CREATININE 1.03* 0.82   CALCIUM 8.7* 8.2*   Liver Function Tests: Recent Labs  Lab 12/29/20 1427  AST 25  ALT 13  ALKPHOS 60  BILITOT 0.7  PROT 7.2  ALBUMIN 4.0   No results for input(s): LIPASE, AMYLASE in the last 168 hours. No results for input(s): AMMONIA in the last 168 hours. CBC: Recent Labs  Lab 12/29/20 1427  WBC 6.2  HGB 10.8*  HCT 31.1*  MCV 96.6  PLT 207   Cardiac Enzymes: No results for input(s): CKTOTAL, CKMB, CKMBINDEX, TROPONINI in the last 168 hours. BNP: Invalid input(s): POCBNP CBG: Recent Labs  Lab 12/29/20 2136 12/30/20 0825 12/30/20 1153  GLUCAP 110* 98 136*   D-Dimer No results for input(s): DDIMER in the last 72 hours. Hgb A1c No results for input(s): HGBA1C in the last 72 hours. Lipid Profile Recent Labs    12/30/20 0536  CHOL 122  HDL 36*  LDLCALC 69  TRIG 83  CHOLHDL 3.4   Thyroid function studies No results for input(s): TSH, T4TOTAL, T3FREE, THYROIDAB in the last 72 hours.  Invalid input(s): FREET3 Anemia work up No results for input(s): VITAMINB12, FOLATE, FERRITIN, TIBC, IRON, RETICCTPCT in the last 72 hours. Urinalysis  Component Value Date/Time   COLORURINE YELLOW 12/29/2020 1601   APPEARANCEUR CLEAR 12/29/2020 1601   LABSPEC <1.005 (L) 12/29/2020 1601   PHURINE 6.0 12/29/2020 1601   GLUCOSEU NEGATIVE 12/29/2020 1601   HGBUR TRACE (A) 12/29/2020 1601   BILIRUBINUR NEGATIVE 12/29/2020 1601   KETONESUR NEGATIVE 12/29/2020 1601   PROTEINUR NEGATIVE 12/29/2020 1601   UROBILINOGEN 0.2 05/20/2014 0802   NITRITE NEGATIVE 12/29/2020 1601   LEUKOCYTESUR LARGE (A) 12/29/2020 1601   Sepsis Labs Invalid input(s): PROCALCITONIN,  WBC,  LACTICIDVEN Microbiology Recent Results (from the past 240 hour(s))  Resp Panel by RT-PCR (Flu A&B, Covid) Nasopharyngeal Swab     Status: None   Collection Time: 12/29/20  2:48 PM   Specimen: Nasopharyngeal Swab; Nasopharyngeal(NP) swabs in vial transport medium  Result Value Ref Range Status   SARS  Coronavirus 2 by RT PCR NEGATIVE NEGATIVE Final    Comment: (NOTE) SARS-CoV-2 target nucleic acids are NOT DETECTED.  The SARS-CoV-2 RNA is generally detectable in upper respiratory specimens during the acute phase of infection. The lowest concentration of SARS-CoV-2 viral copies this assay can detect is 138 copies/mL. A negative result does not preclude SARS-Cov-2 infection and should not be used as the sole basis for treatment or other patient management decisions. A negative result may occur with  improper specimen collection/handling, submission of specimen other than nasopharyngeal swab, presence of viral mutation(s) within the areas targeted by this assay, and inadequate number of viral copies(<138 copies/mL). A negative result must be combined with clinical observations, patient history, and epidemiological information. The expected result is Negative.  Fact Sheet for Patients:  BloggerCourse.com  Fact Sheet for Healthcare Providers:  SeriousBroker.it  This test is no t yet approved or cleared by the [place] FDA and  has been authorized for detection and/or diagnosis of SARS-CoV-2 by FDA under an Emergency Use Authorization (EUA). This EUA will remain  in effect (meaning this test can be used) for the duration of the COVID-19 declaration under Section 564(b)(1) of the Act, 21 U.S.C.section 360bbb-3(b)(1), unless the authorization is terminated  or revoked sooner.       Influenza A by PCR NEGATIVE NEGATIVE Final   Influenza B by PCR NEGATIVE NEGATIVE Final    Comment: (NOTE) The Xpert Xpress SARS-CoV-2/FLU/RSV plus assay is intended as an aid in the diagnosis of influenza from Nasopharyngeal swab specimens and should not be used as a sole basis for treatment. Nasal washings and aspirates are unacceptable for Xpert Xpress SARS-CoV-2/FLU/RSV testing.  Fact Sheet for  Patients: BloggerCourse.com  Fact Sheet for Healthcare Providers: SeriousBroker.it  This test is not yet approved or cleared by the [place] FDA and has been authorized for detection and/or diagnosis of SARS-CoV-2 by FDA under an Emergency Use Authorization (EUA). This EUA will remain in effect (meaning this test can be used) for the duration of the COVID-19 declaration under Section 564(b)(1) of the Act, 21 U.S.C. section 360bbb-3(b)(1), unless the authorization is terminated or revoked.  Performed at [facility], [address], [place], [place] [address]    Time coordinating discharge:   SIGNED:  Standley Dakins, MD  [facility] 12/30/2020, 2:18 PM How to contact the [facility] Attending or Consulting provider 7A - 7P or covering provider during after hours 7P -7A, for this patient?  Check the care team in [facility] and look for a) attending/consulting [facility] provider listed and b) the [facility] team listed Log into [email or web address] and use [place]'s universal password to access. If you do not have  the password, please contact the hospital operator. Locate the [facility] provider you are looking for under [facility] and page to a number that you can be directly reached. If you still have difficulty reaching the provider, please page the [facility] (Director on Call) for the Hospitalists listed on amion for assistance.

## 2020-12-31 ENCOUNTER — Ambulatory Visit: Payer: Medicare Other | Admitting: Cardiology

## 2020-12-31 LAB — URINE CULTURE: Culture: <10000 — AB

## 2021-01-05 NOTE — Progress Notes (Signed)
Cardiology Office Note  Date: 01/06/2021   ID: Erin Herman, DOB [birth year], MRN [id number]  PCP:  Kirstie Peri, MD  Cardiologist:  Nona Dell, MD Electrophysiologist:  None   Chief Complaint: Hospital follow-up  History of Present Illness: Erin Herman is a 87 y.o. female with a history of HTN, CAD, secondary cardiomyopathy, aortic valve disorder, carotid artery disease, chronic diastolic heart failure, NSTEMI, chronic systolic heart failure, HLD.  She was last seen by me on 12/04/2020 for follow-up status post cardiac catheterization.  She she denies any further episode of chest pain since hospital discharge.  She denied any DOE or SOB, PND, orthopnea, bleeding, orthostatic symptoms, CVA or TIA-like symptoms, claudication, DVT PE or PE-like symptoms.  She was continuing her cardiac regimen of aspirin, Lasix, Imdur, losartan, metolazone, Toprol-XL, spironolactone, potassium.  After cardiac catheterization medical management was recommended.  Daughter stated she was extremely susceptible to increased dose of nitrates.  Cardiac cath revealed severe three-vessel disease.  She was to continue aspirin 81 mg daily, Imdur 15 mg daily, Toprol 50 mg daily, Crestor 5 mg daily.  She appears euvolemic and was continuing her Lasix 40 mg p.o. twice daily and metolazone as needed for swelling.  She was continuing spironolactone 12.5 mg daily, and losartan 12.5 mg daily.  Blood pressure was well controlled.  Echocardiogram November 07, 2020 EF 40 to 45%, G2 DD, RV moderately enlarged, LA severely dilated, RA mildly dilated, mild MR, 23 mm bioprosthetic aortic valve.  She had an recent admission on 12/29/2020 for chest pain/chest pressure.  She had sudden onset of chest pain/pressure which she described as feeling similar to her previous MI symptoms.  She denied radiation.  Not associated with exertion.  No associated dyspnea, palpitation, nausea, diaphoresis.  She stated once the pain began it was  constant and did not worsen.  Pain had started up again in the emergency department when she arrived.  She did have nausea not associated with chest pain for the prior 2 days.  She had diarrhea and poor p.o. intake secondary to poor appetite.  She was seen by cardiology who recommended observation for trending troponins and inpatient cardiology consult.  She was admitted with unstable angina.  She was seen by Dr. Tenny Craw cardiology in consultation.  She recommended monitoring blood pressure at home with goal of keeping systolic blood pressure greater than 100.  She was discharged on 12/30/2020.  Troponins were 03-30-34.  She is here for follow-up.  She states the Imdur makes her feel a little funny in her head even though she is on the lowest possible dose.  States her blood pressures have been hovering around 100 or below since being at home.  She states she is very sensitive to any type of nitrate.  She states she cannot take sublingual nitroglycerin because it causes a precipitous drop in her blood pressure.  She states she is very sensitive to medication dosage.  She denies any further anginal symptoms i.e. chest pain or chest pressure.  She denies any further diarrhea.  Denies any nausea, vomiting. Blood pressure today is 100/50 and pulse rate of 74.  She states she was allergic to morphine in the ED and they gave it to her anyway and she felt bad after medication given.  I advised her we will place that on the allergy list.   Past Medical History:  Diagnosis Date   Anemia    iron   Anxiety    Aortic stenosis    Bioprosthetic  AVR 2006   CAD (coronary artery disease), native coronary artery    Multivessel status post CABG 2006, occluded SVG to circumflex, DES to circumflex July 2020   Cardiomyopathy [facility](HCC)    Chronic kidney disease    Depression    Dysphagia    Essential hypertension    Family history of adverse reaction to anesthesia    N&V   GERD (gastroesophageal reflux disease)    Headache     History of hiatal hernia    HOH (hard of hearing)    LBBB (left bundle branch block)    Mixed hyperlipidemia    NSTEMI (non-ST elevated myocardial infarction) [facility](HCC)    July 2020   Osteoarthritis    Postoperative nausea and vomiting    Morphine   Type 2 diabetes mellitus (HCC)    UTI (lower urinary tract infection)     Past Surgical History:  Procedure Laterality Date   ABDOMINAL HYSTERECTOMY     partial   AORTIC VALVE REPLACEMENT  2006   Dr. Cornelius Moraswen - 23 mm [place] stentless cadaver valve   CARDIAC CATHETERIZATION  2020   with stents   CHOLECYSTECTOMY     CORONARY ARTERY BYPASS GRAFT  2006    Dr. Cornelius Moraswen - LIMA to LAD, SVG to circumflex   CORONARY STENT INTERVENTION N/A 11/06/2018   Procedure: CORONARY STENT INTERVENTION;  Surgeon: Yvonne KendallEnd, Christopher, MD;  Location: [facility] INVASIVE CV LAB;  Service: Cardiovascular;  Laterality: N/A;   CORONARY/GRAFT ANGIOGRAPHY N/A 11/06/2018   Procedure: CORONARY/GRAFT ANGIOGRAPHY;  Surgeon: Yvonne KendallEnd, Christopher, MD;  Location: [facility] INVASIVE CV LAB;  Service: Cardiovascular;  Laterality: N/A;   LEFT HEART CATH AND CORS/GRAFTS ANGIOGRAPHY N/A 11/07/2020   Procedure: LEFT HEART CATH AND CORS/GRAFTS ANGIOGRAPHY;  Surgeon: Kathleene HazelMcAlhany, Christopher D, MD;  Location: [facility] INVASIVE CV LAB;  Service: Cardiovascular;  Laterality: N/A;   THYROID LOBECTOMY Right    TOTAL HIP ARTHROPLASTY Right 03/01/2017   Procedure: RIGHT TOTAL HIP ARTHROPLASTY ANTERIOR APPROACH;  Surgeon: Durene Romanslin, Matthew, MD;  Location: [facility] ORS;  Service: Orthopedics;  Laterality: Right;  70 mins   TOTAL HIP ARTHROPLASTY Left 04/29/2020   Procedure: TOTAL HIP ARTHROPLASTY ANTERIOR APPROACH;  Surgeon: Durene Romanslin, Matthew, MD;  Location: [facility] ORS;  Service: Orthopedics;  Laterality: Left;  70 mins   TOTAL KNEE ARTHROPLASTY Right 2006   TOTAL KNEE ARTHROPLASTY Left 05/20/2014   DR Thurston HoleWAINER   TOTAL KNEE ARTHROPLASTY Left 05/20/2014   Procedure: LEFT TOTAL KNEE ARTHROPLASTY;  Surgeon: Nilda Simmerobert A Wainer, MD;  [facility];   Service: Orthopedics;  Laterality: Left;    Current Outpatient Medications  Medication Sig Dispense Refill   APPLE CIDER VINEGAR PO Take 1 tablet by mouth every morning.     aspirin EC 81 MG tablet Take 1 tablet (81 mg total) by mouth daily. Swallow whole. 90 tablet 3   Biotin w/ Vitamins C & E (HAIR/SKIN/NAILS PO) Take 1 tablet by mouth daily.     Cholecalciferol (VITAMIN D3) 125 MCG (5000 UT) CAPS Take 5,000 Units by mouth daily.     CINNAMON PO Take 1 tablet by mouth 2 (two) times daily.     Coenzyme Q10 (COQ10 PO) Take 1 tablet by mouth daily.     CRANBERRY PO Take 1 tablet by mouth daily.     Cyanocobalamin (B-12) 2500 MCG TABS Take 2,500 mcg by mouth daily.     docusate sodium (COLACE) 100 MG capsule Take 1 capsule (100 mg total) by mouth 2 (two) times daily. 28 capsule 0  famotidine (PEPCID) 20 MG tablet Take 1 tablet (20 mg total) by mouth 2 (two) times daily. 60 tablet 1   furosemide (LASIX) 40 MG tablet Take 1 tablet (40 mg total) by mouth 2 (two) times daily. 60 tablet 6   levothyroxine (SYNTHROID) 88 MCG tablet Take 88 mcg by mouth daily before breakfast.     losartan (COZAAR) 25 MG tablet Take 0.5 tablets (12.5 mg total) by mouth daily. 15 tablet 3   metFORMIN (GLUCOPHAGE) 500 MG tablet Take 1 tablet (500 mg total) by mouth 2 (two) times daily with a meal.     metoprolol succinate (TOPROL XL) 25 MG 24 hr tablet Take 1 tablet (25 mg total) by mouth daily. 901 tablet 1   Multiple Vitamins-Minerals (HAIR/SKIN/NAILS/BIOTIN) TABS Take 1 tablet by mouth daily.     Multiple Vitamins-Minerals (MULTIVITAMIN PO) Take 1 tablet by mouth daily.     Polyethyl Glycol-Propyl Glycol (SYSTANE OP) Place 1 drop into both eyes daily.     potassium chloride SA (K-DUR,KLOR-CON) 20 MEQ tablet Take 1 tablet (20 mEq total) by mouth daily after supper. 90 tablet 0   psyllium (METAMUCIL) 58.6 % packet Take 1 packet by mouth daily.     rosuvastatin (CRESTOR) 5 MG tablet Take 1 tablet (5 mg total) by mouth  daily. 90 tablet 2   sertraline (ZOLOFT) 100 MG tablet Take 50 mg by mouth at bedtime.     spironolactone (ALDACTONE) 25 MG tablet Take 0.5 tablets (12.5 mg total) by mouth daily. 45 tablet 3   vitamin C (ASCORBIC ACID) 500 MG tablet Take 500 mg by mouth in the morning and at bedtime.     Vitamin E 180 MG (400 UNIT) CAPS Take 400 Units by mouth daily.     isosorbide mononitrate (IMDUR) 30 MG 24 hr tablet Take 7.5 mg by mouth daily 15 tablet 2   No current facility-administered medications for this visit.   Allergies:  Lipitor [atorvastatin], Morphine, Penicillins, and Reclast [zoledronic acid]   Social History: The patient  reports that she has never smoked. She has never used smokeless tobacco. She reports that she does not drink alcohol and does not use drugs.   Family History: The patient's family history includes Cirrhosis in her father; Diabetes in her father and another family member; Heart Problems in her mother.   ROS:  Please see the history of present illness. Otherwise, complete review of systems is positive for none.  All other systems are reviewed and negative.   Physical Exam: VS:  BP (!) 100/50   Pulse 74   Ht 5\' 4"  (1.626 m)   Wt 133 lb (60.3 kg)   LMP  (LMP Unknown)   SpO2 97%   BMI 22.83 kg/m , BMI Body mass index is 22.83 kg/m.  Wt Readings from Last 3 Encounters:  01/06/21 133 lb (60.3 kg)  12/29/20 127 lb (57.6 kg)  12/04/20 134 lb 3.2 oz (60.9 kg)    General: Patient appears comfortable at rest. Neck: Supple, no elevated JVP or carotid bruits, no thyromegaly. Lungs: Clear to auscultation, nonlabored breathing at rest. Cardiac: Regular rate and rhythm, no S3 or significant systolic murmur, no pericardial rub. Extremities: No pitting edema, distal pulses 2+. Skin: Warm and dry. Musculoskeletal: No kyphosis. Neuropsychiatric: Alert and oriented x3, affect grossly appropriate.  ECG:  EKG 12/29/2020) ED, sinus rhythm rate of 92, nonspecific IVCD with LAD.   LVH.  LBBB  Recent Labwork: 11/06/2020: Magnesium 1.8 12/29/2020: ALT 13; AST 25; Hemoglobin 10.8;  Platelets 207 12/30/2020: BUN 25; Creatinine, Ser 0.82; Potassium 3.0; Sodium 134     Component Value Date/Time   CHOL 122 12/30/2020 0536   TRIG 83 12/30/2020 0536   HDL 36 (L) 12/30/2020 0536   CHOLHDL 3.4 12/30/2020 0536   VLDL 17 12/30/2020 0536   LDLCALC 69 12/30/2020 0536    Other Studies Reviewed Today:   Cardiac catheter is 2020-11-09 LEFT HEART CATH AND CORS/GRAFTS ANGIOGRAPHY      Conclusion       Mid LM lesion is 30% stenosed.   Ost LAD lesion is 25% stenosed.   Prox Cx to Mid Cx lesion is 50% stenosed.   Mid RCA lesion is 100% stenosed.   Origin to Prox Graft lesion is 100% stenosed.   LPAV lesion is 40% stenosed.   1st Mrg lesion is 95% stenosed.   Prox LAD to Mid LAD lesion is 99% stenosed.   Previously placed Ost Cx to Prox Cx stent (unknown type) is  widely patent.   SVG graft was not injected.   LIMA and is normal in caliber.   The graft exhibits no disease.   Severe triple vessel CAD s/p CABG with 1/2 patent bypass grafts Chronic sub-total occlusion of the mid LAD. The mid and distal LAD fills from the patent LIMA graft Patent proximal Circumflex stent. Beyond the stent there is a moderate stenosis in the AV groove Circumflex stent. The moderate caliber obtuse marginal branch has a long segment of severe stenosis extending from the ostium into the mid vessel. This has been present since her last cath. The vessel had been bypassed. The vein graft to the obtuse marginal branch is known to be occluded. The obtuse marginal branch is not a favorable target for PCI. The RCA is a small non-dominant vessel. The vessel is chronically occluded in the mid segment. The distal vessel fills from right to right collaterals.   Recommendations: No good options for PCI. I would not approach the OM branch with PCI as this would likely compromise flow into the AV groove Circumflex  which supplies a dominant lateral wall system. Continue medical management of CAD.  Diagnostic Dominance: Left        Echocardiogram November 09, 2020  1. Pronounced septal motion consistent with LBBB and postop. Left ventricular ejection fraction, by estimation, is 40 to 45%. The left ventricle has mildly decreased function. The left ventricle has no regional wall motion abnormalities. Left ventricular diastolic parameters are consistent with Grade II diastolic dysfunction (pseudonormalization). 2. Right ventricular systolic function is mildly reduced. The right ventricular size is moderately enlarged. There is normal pulmonary artery systolic pressure. The estimated right ventricular systolic pressure is 28.6 mmHg. 3. Left atrial size was severely dilated. 4. Right atrial size was mildly dilated. 5. The mitral valve is grossly normal. Mild mitral valve regurgitation. No evidence of mitral stenosis. 6. 23 mm bioprosthetic aortic valve. Vmax 2.1 m/s, MG 11 mmHG, EOA 0.89. DI 0.35. Normal prosthesis. The aortic valve has been repaired/replaced. Aortic valve regurgitation is not visualized. 7. The inferior vena cava is normal in size with greater than 50% respiratory variability, suggesting right atrial pressure of 3 mmHg. Comparison(s): No significant change from prior study. EF relatively unchanged.     Assessment and Plan:  1. CAD in native artery   2. Unstable angina pectoris (HCC)   3. Mixed hyperlipidemia   4. Secondary cardiomyopathy (HCC)    1. CAD in native artery Currently denies any anginal or exertional symptoms.  She states  she did have 1 episode of mild chest pressure recently but was short-lived.  She states she took an aspirin and the half pill of Imdur which resolved the pain.  She states she is very sensitive to medications.  She states she believes the isosorbide mononitrate is causing her to feel funny in the head.  She is very sensitive to nitrates with precipitous  blood pressure drops with sublingual nitroglycerin.  Blood pressure is low today with BP of 100/50.  I advised her she could initially decrease Toprol-XL to 25 mg to see if this would help her blood pressure.  Advised her to try this for the first week and then if tolerated and blood pressure is increased above 100 she could split the Imdur 15 mg (to 7.5 mg) due to her sensitivity and side effects to see if this would help.  She and daughter agree and understand.  Decrease Toprol to 25 mg daily.  Decrease Imdur to 7.5 mg p.o. daily.  Continue aspirin 81 mg daily.  2. Unstable angina pectoris (HCC) Patient's had no recent chest pain other than slight episode recently which quickly resolved without issue after taking low-dose aspirin and 1/2 pill of Imdur.  Continue aspirin 81 mg daily.  3. Mixed hyperlipidemia Continue Crestor 5 mg daily.  4. Secondary cardiomyopathy (HCC) Echocardiogram 11/21/20.  Pronounced septal motion consistent with LBBB and postop.  LVEF 40 to 45%.  No WMA's.  G2 DD, RV moderately enlarged, LA severely dilated, RA mildly dilated, mild MR, bioprosthetic aortic valve.  Continue Toprol at decreased dose of 25 mg daily.  Continue spironolactone 12.5 mg p.o. daily.  Continue losartan 12.5 mg p.o. daily.  Continue Lasix 40 mg p.o. twice daily.    Medication Adjustments/Labs and Tests Ordered: Current medicines are reviewed at length with the patient today.  Concerns regarding medicines are outlined above.   Disposition: Follow-up with Dr. Diona Browner or APP 6 months  Signed, Rennis Harding, NP 01/06/2021 2:47 PM    [facility] at [facility] [address] [place], [place], [place] [address] Phone: [phone]; Fax: [phone]

## 2021-01-06 ENCOUNTER — Ambulatory Visit: Payer: Medicare Other | Admitting: Family Medicine

## 2021-01-06 ENCOUNTER — Other Ambulatory Visit: Payer: Self-pay

## 2021-01-06 ENCOUNTER — Encounter: Payer: Self-pay | Admitting: Family Medicine

## 2021-01-06 VITALS — BP 100/50 | HR 74 | Ht 64.0 in | Wt 133.0 lb

## 2021-01-06 DIAGNOSIS — E782 Mixed hyperlipidemia: Secondary | ICD-10-CM

## 2021-01-06 DIAGNOSIS — N183 Chronic kidney disease, stage 3 unspecified: Secondary | ICD-10-CM | POA: Diagnosis not present

## 2021-01-06 DIAGNOSIS — I25110 Atherosclerotic heart disease of native coronary artery with unstable angina pectoris: Secondary | ICD-10-CM

## 2021-01-06 DIAGNOSIS — I2510 Atherosclerotic heart disease of native coronary artery without angina pectoris: Secondary | ICD-10-CM

## 2021-01-06 DIAGNOSIS — I200 Unstable angina: Secondary | ICD-10-CM

## 2021-01-06 DIAGNOSIS — I429 Cardiomyopathy, unspecified: Secondary | ICD-10-CM | POA: Diagnosis not present

## 2021-01-06 DIAGNOSIS — E1122 Type 2 diabetes mellitus with diabetic chronic kidney disease: Secondary | ICD-10-CM | POA: Diagnosis not present

## 2021-01-06 MED ORDER — ROSUVASTATIN CALCIUM 5 MG PO TABS: 5.0000 mg | ORAL_TABLET | Freq: Every day | ORAL | 2 refills | Status: DC

## 2021-01-06 MED ORDER — METOPROLOL SUCCINATE ER 25 MG PO TB24: 25.0000 mg | ORAL_TABLET | Freq: Every day | ORAL | 1 refills | Status: DC

## 2021-01-06 MED ORDER — ISOSORBIDE MONONITRATE ER 30 MG PO TB24: ORAL_TABLET | ORAL | 2 refills | Status: DC

## 2021-01-06 NOTE — Patient Instructions (Addendum)
Medication Instructions:  Your physician has recommended you make the following change in your medication:  Decrease metoprolol succinate to 25 mg daily Restart isosorbide mononitrate 7.5 mg daily after one week of being on metoprolol 25 mg Continue other medications the same  Labwork: none  Testing/Procedures: none  Follow-Up: Your physician recommends that you schedule a follow-up appointment in: 6 months  Any Other Special Instructions Will Be Listed Below (If Applicable).  If you need a refill on your cardiac medications before your next appointment, please call your pharmacy.

## 2021-01-07 ENCOUNTER — Other Ambulatory Visit: Payer: Self-pay | Admitting: *Deleted

## 2021-01-07 MED ORDER — METOPROLOL SUCCINATE ER 25 MG PO TB24: 25.0000 mg | ORAL_TABLET | Freq: Every day | ORAL | 3 refills | Status: DC

## 2021-06-08 ENCOUNTER — Ambulatory Visit: Payer: Medicare Other | Admitting: Cardiology

## 2021-06-08 ENCOUNTER — Encounter: Payer: Self-pay | Admitting: Cardiology

## 2021-06-08 VITALS — BP 88/50 | HR 78 | Ht 64.0 in | Wt 135.4 lb

## 2021-06-08 DIAGNOSIS — I25119 Atherosclerotic heart disease of native coronary artery with unspecified angina pectoris: Secondary | ICD-10-CM | POA: Diagnosis not present

## 2021-06-08 DIAGNOSIS — I5020 Unspecified systolic (congestive) heart failure: Secondary | ICD-10-CM | POA: Diagnosis not present

## 2021-06-08 DIAGNOSIS — Z953 Presence of xenogenic heart valve: Secondary | ICD-10-CM

## 2021-06-08 NOTE — Patient Instructions (Signed)
Medication Instructions:   STOP Losartan   Labwork: None today  Testing/Procedures: None today  Follow-Up: 6 months  Any Other Special Instructions Will Be Listed Below (If Applicable).  If you need a refill on your cardiac medications before your next appointment, please call your pharmacy.

## 2021-06-08 NOTE — Progress Notes (Signed)
Cardiology Office Note  Date: 06/08/2021   ID: Erin Herman, DOB 05/22/1927, MRN [id number]  PCP:  Monico Blitz, MD  Cardiologist:  Rozann Lesches, MD Electrophysiologist:  None   Chief Complaint  Patient presents with   Cardiac follow-up    History of Present Illness: Erin Herman is a 88 y.o. female last seen in September 2022 by Mr. Leonides Sake NP.  She is here today with her husband and daughter.  Reports fatigue, lack of stamina, no angina symptoms however.  We went over her medications.  She is not taking Imdur on a regular basis.  We discussed stopping losartan altogether since she is on the lowest dose and blood pressure remains low.  With current dose of Lasix she still has ankle edema, has not been able to cut this back.  I reviewed her last echocardiogram from July 2022, LVEF approximately 40 to 45% at that time.  Bioprosthetic AVR with mean gradient 11 mmHg and normal function.  Past Medical History:  Diagnosis Date   Anemia    iron   Anxiety    Aortic stenosis    Bioprosthetic AVR 2006   CAD (coronary artery disease), native coronary artery    Multivessel status post CABG 2006, occluded SVG to circumflex, DES to circumflex July 2020   Cardiomyopathy [facility])    Chronic kidney disease    Depression    Dysphagia    Essential hypertension    Family history of adverse reaction to anesthesia    N&V   GERD (gastroesophageal reflux disease)    Headache    History of hiatal hernia    HOH (hard of hearing)    LBBB (left bundle branch block)    Mixed hyperlipidemia    NSTEMI (non-ST elevated myocardial infarction) [facility])    July 2020   Osteoarthritis    Postoperative nausea and vomiting    Morphine   Type 2 diabetes mellitus ([place])    UTI (lower urinary tract infection)     Past Surgical History:  Procedure Laterality Date   ABDOMINAL HYSTERECTOMY     partial   AORTIC VALVE REPLACEMENT  2006   Dr. Roxy Manns - 23 mm [place] stentless cadaver valve    CARDIAC CATHETERIZATION  2020   with stents   CHOLECYSTECTOMY     CORONARY ARTERY BYPASS GRAFT  2006    Dr. Roxy Manns - LIMA to LAD, SVG to circumflex   CORONARY STENT INTERVENTION N/A 11/06/2018   Procedure: CORONARY STENT INTERVENTION;  Surgeon: Nelva Bush, MD;  Location: [place] [facility];  Service: Cardiovascular;  Laterality: N/A;   CORONARY/GRAFT ANGIOGRAPHY N/A 11/06/2018   Procedure: CORONARY/GRAFT ANGIOGRAPHY;  Surgeon: Nelva Bush, MD;  Location: [place] [facility];  Service: Cardiovascular;  Laterality: N/A;   LEFT HEART CATH AND CORS/GRAFTS ANGIOGRAPHY N/A 11/07/2020   Procedure: LEFT HEART CATH AND CORS/GRAFTS ANGIOGRAPHY;  Surgeon: Burnell Blanks, MD;  Location: [place] CV [facility];  Service: Cardiovascular;  Laterality: N/A;   THYROID LOBECTOMY Right    TOTAL HIP ARTHROPLASTY Right 03/01/2017   Procedure: RIGHT TOTAL HIP ARTHROPLASTY ANTERIOR APPROACH;  Surgeon: Paralee Cancel, MD;  [facility]: [facility];  Service: Orthopedics;  Laterality: Right;  70 mins   TOTAL HIP ARTHROPLASTY Left 04/29/2020   Procedure: TOTAL HIP ARTHROPLASTY ANTERIOR APPROACH;  Surgeon: Paralee Cancel, MD;  [facility]: [facility];  Service: Orthopedics;  Laterality: Left;  70 mins   TOTAL KNEE ARTHROPLASTY Right 2006   TOTAL KNEE ARTHROPLASTY Left 05/20/2014   DR Noemi Chapel  TOTAL KNEE ARTHROPLASTY Left 05/20/2014   Procedure: LEFT TOTAL KNEE ARTHROPLASTY;  Surgeon: Lorn Junes, MD;  Location: [place];  Service: Orthopedics;  Laterality: Left;    Current Outpatient Medications  Medication Sig Dispense Refill   APPLE CIDER VINEGAR PO Take 1 tablet by mouth every morning.     aspirin EC 81 MG tablet Take 1 tablet (81 mg total) by mouth daily. Swallow whole. 90 tablet 3   Biotin w/ Vitamins C & E (HAIR/SKIN/NAILS PO) Take 1 tablet by mouth daily.     Cholecalciferol (VITAMIN D3) 125 MCG (5000 UT) CAPS Take 5,000 Units by mouth daily.     CINNAMON PO Take 1 tablet by mouth 2 (two) times daily.     Coenzyme  Q10 (COQ10 PO) Take 1 tablet by mouth daily.     CRANBERRY PO Take 1 tablet by mouth daily.     Cyanocobalamin (B-12) 2500 MCG TABS Take 2,500 mcg by mouth daily.     famotidine (PEPCID) 20 MG tablet Take 10 mg by mouth 2 (two) times daily.     furosemide (LASIX) 40 MG tablet Take 1 tablet (40 mg total) by mouth 2 (two) times daily. 60 tablet 6   levothyroxine (SYNTHROID) 88 MCG tablet Take 88 mcg by mouth daily before breakfast.     metFORMIN (GLUCOPHAGE) 500 MG tablet Take 1 tablet (500 mg total) by mouth 2 (two) times daily with a meal.     metoprolol succinate (TOPROL XL) 25 MG 24 hr tablet Take 1 tablet (25 mg total) by mouth daily. 90 tablet 3   Multiple Vitamins-Minerals (HAIR/SKIN/NAILS/BIOTIN) TABS Take 1 tablet by mouth daily.     Polyethyl Glycol-Propyl Glycol (SYSTANE OP) Place 1 drop into both eyes as needed.     potassium chloride SA (K-DUR,KLOR-CON) 20 MEQ tablet Take 1 tablet (20 mEq total) by mouth daily after supper. 90 tablet 0   psyllium (METAMUCIL) 58.6 % packet Take 1 packet by mouth daily.     rosuvastatin (CRESTOR) 5 MG tablet Take 1 tablet (5 mg total) by mouth daily. 90 tablet 2   sertraline (ZOLOFT) 100 MG tablet Take 100 mg by mouth at bedtime.     spironolactone (ALDACTONE) 25 MG tablet Take 0.5 tablets (12.5 mg total) by mouth daily. 45 tablet 3   vitamin C (ASCORBIC ACID) 500 MG tablet Take 500 mg by mouth daily.     Vitamin E 180 MG (400 UNIT) CAPS Take 400 Units by mouth daily.     isosorbide mononitrate (IMDUR) 30 MG 24 hr tablet Take 7.5 mg by mouth daily (Patient not taking: Reported on 06/08/2021) 15 tablet 2   No current facility-administered medications for this visit.   Allergies:  Lipitor [atorvastatin], Morphine, Penicillins, and Reclast [zoledronic acid]   ROS: No orthopnea or PND.  Physical Exam: VS:  BP (!) 88/50    Pulse 78    Ht 5\' 4"  (1.626 m)    Wt 135 lb 6.4 oz (61.4 kg)    LMP  (LMP Unknown)    SpO2 95%    BMI 23.24 kg/m , BMI Body mass  index is 23.24 kg/m.  Wt Readings from Last 3 Encounters:  06/08/21 135 lb 6.4 oz (61.4 kg)  01/06/21 133 lb (60.3 kg)  12/29/20 127 lb (57.6 kg)    General: Patient appears comfortable at rest. HEENT: Conjunctiva and lids normal, wearing a mask. Neck: Supple, no elevated JVP or carotid bruits, no thyromegaly. Lungs: Clear to auscultation, nonlabored breathing  at rest. Cardiac: Regular rate and rhythm, no S3, 2/6 systolic murmur, no pericardial rub. Extremities: Trace ankle edema.  ECG:  An ECG dated 12/29/2020 was personally reviewed today and demonstrated:  Sinus rhythm with left bundle branch block.  Recent Labwork: 11/06/2020: Magnesium 1.8 12/29/2020: ALT 13; AST 25; Hemoglobin 10.8; Platelets 207 12/30/2020: BUN 25; Creatinine, Ser 0.82; Potassium 3.0; Sodium 134     Component Value Date/Time   CHOL 122 12/30/2020 0536   TRIG 83 12/30/2020 0536   HDL 36 (L) 12/30/2020 0536   CHOLHDL 3.4 12/30/2020 0536   VLDL 17 12/30/2020 0536   LDLCALC 69 12/30/2020 0536    Other Studies Reviewed Today:  Echocardiogram 25-Nov-2020:  1. Pronounced septal motion consistent with LBBB and postop. Left  ventricular ejection fraction, by estimation, is 40 to 45%. The left  ventricle has mildly decreased function. The left ventricle has no  regional wall motion abnormalities. Left  ventricular diastolic parameters are consistent with Grade II diastolic  dysfunction (pseudonormalization).   2. Right ventricular systolic function is mildly reduced. The right  ventricular size is moderately enlarged. There is normal pulmonary artery  systolic pressure. The estimated right ventricular systolic pressure is  [id number] mmHg.   3. Left atrial size was severely dilated.   4. Right atrial size was mildly dilated.   5. The mitral valve is grossly normal. Mild mitral valve regurgitation.  No evidence of mitral stenosis.   6. 23 mm bioprosthetic aortic valve. Vmax 2.1 m/s, MG 11 mmHG, EOA 0.89.  DI 0.35.  Normal prosthesis. The aortic valve has been repaired/replaced.  Aortic valve regurgitation is not visualized.   7. The inferior vena cava is normal in size with greater than 50%  respiratory variability, suggesting right atrial pressure of 3 mmHg.   Cardiac catheterization 11-25-20:   Mid LM lesion is 30% stenosed.   Ost LAD lesion is 25% stenosed.   Prox Cx to Mid Cx lesion is 50% stenosed.   Mid RCA lesion is 100% stenosed.   Origin to Prox Graft lesion is 100% stenosed.   LPAV lesion is 40% stenosed.   1st Mrg lesion is 95% stenosed.   Prox LAD to Mid LAD lesion is 99% stenosed.   Previously placed Ost Cx to Prox Cx stent (unknown type) is  widely patent.   SVG graft was not injected.   LIMA and is normal in caliber.   The graft exhibits no disease.   Severe triple vessel CAD s/p CABG with 1/2 patent bypass grafts Chronic sub-total occlusion of the mid LAD. The mid and distal LAD fills from the patent LIMA graft Patent proximal Circumflex stent. Beyond the stent there is a moderate stenosis in the AV groove Circumflex stent. The moderate caliber obtuse marginal branch has a long segment of severe stenosis extending from the ostium into the mid vessel. This has been present since her last cath. The vessel had been bypassed. The vein graft to the obtuse marginal branch is known to be occluded. The obtuse marginal branch is not a favorable target for PCI.  The RCA is a small non-dominant vessel. The vessel is chronically occluded in the mid segment. The distal vessel fills from right to right collaterals.    Recommendations: No good options for PCI. I would not approach the OM branch with PCI as this would likely compromise flow into the AV groove Circumflex which supplies a dominant lateral wall system. Continue medical management of CAD.  Assessment and Plan:  1.  Multivessel CAD status post CABG with 1 of 2 bypass grafts patent at angiography in July 2022.  There were no good options  for PCI at that point and plan is medical therapy.  Fortunately, not reporting any progressive angina at this point.  Not on Imdur regularly.  Cannot tolerate sublingual nitroglycerin very well due to low blood pressures at baseline.  Plan to stop losartan completely.  Otherwise continue aspirin, Toprol-XL, and Crestor.  2.  HFrEF with ischemic cardiomyopathy and LVEF 40 to 45%.  Medical therapy limited by low blood pressure at baseline, in fact stopping losartan completely at this time.  Continue Toprol-XL, Aldactone, Lasix, and potassium supplement.  3.  Aortic valve disease status post bioprosthetic AVR, normal function with mean gradient 11 mmHg by echocardiogram in July 2022.  Medication Adjustments/Labs and Tests Ordered: Current medicines are reviewed at length with the patient today.  Concerns regarding medicines are outlined above.   Tests Ordered: No orders of the defined types were placed in this encounter.   Medication Changes: No orders of the defined types were placed in this encounter.   Disposition:  Follow up 6 months.  Signed, Satira Sark, MD, [facility] 06/08/2021 4:12 PM    [facility] [place], [place], [place] [address] Phone: [phone]; Fax: [phone]

## 2021-06-18 ENCOUNTER — Ambulatory Visit (INDEPENDENT_AMBULATORY_CARE_PROVIDER_SITE_OTHER): Payer: Medicare Other

## 2021-06-18 ENCOUNTER — Ambulatory Visit
Admission: RE | Admit: 2021-06-18 | Discharge: 2021-06-18 | Disposition: A | Discharge status: Home / Self Care | Payer: Medicare Other | Source: Ambulatory Visit | Attending: Urgent Care | Admitting: Urgent Care

## 2021-06-18 ENCOUNTER — Other Ambulatory Visit: Payer: Self-pay

## 2021-06-18 VITALS — BP 110/63 | HR 83 | Temp 97.9°F | Resp 16

## 2021-06-18 DIAGNOSIS — M25472 Effusion, left ankle: Secondary | ICD-10-CM | POA: Diagnosis not present

## 2021-06-18 DIAGNOSIS — M79675 Pain in left toe(s): Secondary | ICD-10-CM | POA: Diagnosis not present

## 2021-06-18 DIAGNOSIS — S92412A Displaced fracture of proximal phalanx of left great toe, initial encounter for closed fracture: Secondary | ICD-10-CM | POA: Diagnosis not present

## 2021-06-18 DIAGNOSIS — S90122A Contusion of left lesser toe(s) without damage to nail, initial encounter: Secondary | ICD-10-CM

## 2021-06-18 DIAGNOSIS — S90112A Contusion of left great toe without damage to nail, initial encounter: Secondary | ICD-10-CM | POA: Diagnosis not present

## 2021-06-18 DIAGNOSIS — M25562 Pain in left knee: Secondary | ICD-10-CM

## 2021-06-18 DIAGNOSIS — M25572 Pain in left ankle and joints of left foot: Secondary | ICD-10-CM

## 2021-06-18 MED ORDER — ACETAMINOPHEN 325 MG PO TABS: 650.0000 mg | ORAL_TABLET | Freq: Four times a day (QID) | ORAL | 0 refills | Status: AC | PRN

## 2021-06-18 MED ORDER — HYDROCODONE-ACETAMINOPHEN 5-325 MG PO TABS: 1.0000 | ORAL_TABLET | Freq: Four times a day (QID) | ORAL | 0 refills | Status: DC | PRN

## 2021-06-18 NOTE — ED Triage Notes (Signed)
Pt presents with pain and swelling in left feet, swelling in left ankle x 2 days, after she tripped and fell with a rug at home. Pt reports pain is worse in the left big toe.

## 2021-06-18 NOTE — ED Provider Notes (Signed)
[place]   MRN: [id number] DOB: 04/18/1928  Subjective:   Erin Herman is a 88 y.o. female presenting for 2-day history of acute onset persistent left great toe pain and swelling with bruising.  She is also had some ankle swelling and left knee pain.  The latter is by enlarge very mild compared to the left great toe pain.  Symptoms started by suffering a fall.  States that she caught her toe on the carpet and ended up falling making impact against her knee as well.  No head injury, loss of consciousness, confusion, dizziness.  No open wounds.  No current facility-administered medications for this encounter.  Current Outpatient Medications:    APPLE CIDER VINEGAR PO, Take 1 tablet by mouth every morning., Disp: , Rfl:    aspirin EC 81 MG tablet, Take 1 tablet (81 mg total) by mouth daily. Swallow whole., Disp: 90 tablet, Rfl: 3   Biotin w/ Vitamins C & E (HAIR/SKIN/NAILS PO), Take 1 tablet by mouth daily., Disp: , Rfl:    Cholecalciferol (VITAMIN D3) 125 MCG (5000 UT) CAPS, Take 5,000 Units by mouth daily., Disp: , Rfl:    CINNAMON PO, Take 1 tablet by mouth 2 (two) times daily., Disp: , Rfl:    Coenzyme Q10 (COQ10 PO), Take 1 tablet by mouth daily., Disp: , Rfl:    CRANBERRY PO, Take 1 tablet by mouth daily., Disp: , Rfl:    Cyanocobalamin (B-12) 2500 MCG TABS, Take 2,500 mcg by mouth daily., Disp: , Rfl:    famotidine (PEPCID) 20 MG tablet, Take 10 mg by mouth 2 (two) times daily., Disp: , Rfl:    furosemide (LASIX) 40 MG tablet, Take 1 tablet (40 mg total) by mouth 2 (two) times daily., Disp: 60 tablet, Rfl: 6   isosorbide mononitrate (IMDUR) 30 MG 24 hr tablet, Take 7.5 mg by mouth daily (Patient not taking: Reported on 06/08/2021), Disp: 15 tablet, Rfl: 2   levothyroxine (SYNTHROID) 88 MCG tablet, Take 88 mcg by mouth daily before breakfast., Disp: , Rfl:    metFORMIN (GLUCOPHAGE) 500 MG tablet, Take 1 tablet (500 mg total) by mouth 2 (two) times daily with a  meal., Disp: , Rfl:    metoprolol succinate (TOPROL XL) 25 MG 24 hr tablet, Take 1 tablet (25 mg total) by mouth daily., Disp: 90 tablet, Rfl: 3   Multiple Vitamins-Minerals (HAIR/SKIN/NAILS/BIOTIN) TABS, Take 1 tablet by mouth daily., Disp: , Rfl:    Polyethyl Glycol-Propyl Glycol (SYSTANE OP), Place 1 drop into both eyes as needed., Disp: , Rfl:    potassium chloride SA (K-DUR,KLOR-CON) 20 MEQ tablet, Take 1 tablet (20 mEq total) by mouth daily after supper., Disp: 90 tablet, Rfl: 0   psyllium (METAMUCIL) 58.6 % packet, Take 1 packet by mouth daily., Disp: , Rfl:    rosuvastatin (CRESTOR) 5 MG tablet, Take 1 tablet (5 mg total) by mouth daily., Disp: 90 tablet, Rfl: 2   sertraline (ZOLOFT) 100 MG tablet, Take 100 mg by mouth at bedtime., Disp: , Rfl:    spironolactone (ALDACTONE) 25 MG tablet, Take 0.5 tablets (12.5 mg total) by mouth daily., Disp: 45 tablet, Rfl: 3   vitamin C (ASCORBIC ACID) 500 MG tablet, Take 500 mg by mouth daily., Disp: , Rfl:    Vitamin E 180 MG (400 UNIT) CAPS, Take 400 Units by mouth daily., Disp: , Rfl:    Allergies  Allergen Reactions   Lipitor [Atorvastatin]     MUSCLE ACHES/FATIGUE   Morphine Nausea And Vomiting  Penicillins Swelling and Other (See Comments)    Tolerated Cephalosporin Date: 04/29/20.  Severe arm swelling and redness Did it involve swelling of the face/tongue/throat, SOB, or low BP? No Did it involve sudden or severe rash/hives, skin peeling, or any reaction on the inside of your mouth or nose? No Did you need to seek medical attention at a hospital or doctor's office? No When did it last happen?      30 years If all above answers are "NO", may proceed with cephalosporin use.   Reclast [Zoledronic Acid]     "nausea and vomiting. Could not get out of bed.    Past Medical History:  Diagnosis Date   Anemia    iron   Anxiety    Aortic stenosis    Bioprosthetic AVR 2006   CAD (coronary artery disease), native coronary artery     Multivessel status post CABG 2006, occluded SVG to circumflex, DES to circumflex July 2020   Cardiomyopathy [facility])    Chronic kidney disease    Depression    Dysphagia    Essential hypertension    Family history of adverse reaction to anesthesia    N&V   GERD (gastroesophageal reflux disease)    Headache    History of hiatal hernia    HOH (hard of hearing)    LBBB (left bundle branch block)    Mixed hyperlipidemia    NSTEMI (non-ST elevated myocardial infarction) [facility])    July 2020   Osteoarthritis    Postoperative nausea and vomiting    Morphine   Type 2 diabetes mellitus ([facility])    UTI (lower urinary tract infection)      Past Surgical History:  Procedure Laterality Date   ABDOMINAL HYSTERECTOMY     partial   AORTIC VALVE REPLACEMENT  2006   Dr. Cornelius Moras - 23 mm [place] stentless cadaver valve   CARDIAC CATHETERIZATION  2020   with stents   CHOLECYSTECTOMY     CORONARY ARTERY BYPASS GRAFT  2006    Dr. Cornelius Moras - LIMA to LAD, SVG to circumflex   CORONARY STENT INTERVENTION N/A 11/06/2018   Procedure: CORONARY STENT INTERVENTION;  Surgeon: Yvonne Kendall, MD;  [facility]: [facility];  Service: Cardiovascular;  Laterality: N/A;   CORONARY/GRAFT ANGIOGRAPHY N/A 11/06/2018   Procedure: CORONARY/GRAFT ANGIOGRAPHY;  Surgeon: Yvonne Kendall, MD;  Location: [facility];  Service: Cardiovascular;  Laterality: N/A;   LEFT HEART CATH AND CORS/GRAFTS ANGIOGRAPHY N/A 11/07/2020   Procedure: LEFT HEART CATH AND CORS/GRAFTS ANGIOGRAPHY;  Surgeon: Kathleene Hazel, MD;  [facility];  Service: Cardiovascular;  Laterality: N/A;   THYROID LOBECTOMY Right    TOTAL HIP ARTHROPLASTY Right 03/01/2017   Procedure: RIGHT TOTAL HIP ARTHROPLASTY ANTERIOR APPROACH;  Surgeon: Durene Romans, MD;  [facility];  Service: Orthopedics;  Laterality: Right;  70 mins   TOTAL HIP ARTHROPLASTY Left 04/29/2020   Procedure: TOTAL HIP ARTHROPLASTY ANTERIOR APPROACH;  Surgeon: Durene Romans, MD;  [facility];  Service: Orthopedics;  Laterality: Left;  70 mins   TOTAL KNEE ARTHROPLASTY Right 2006   TOTAL KNEE ARTHROPLASTY Left 05/20/2014   DR Thurston Hole   TOTAL KNEE ARTHROPLASTY Left 05/20/2014   Procedure: LEFT TOTAL KNEE ARTHROPLASTY;  Surgeon: Nilda Simmer, MD;  [facility];  Service: Orthopedics;  Laterality: Left;    Family History  Problem Relation Age of Onset   Heart Problems Mother    Diabetes Father    Cirrhosis Father  Diabetes Other     Social History   Tobacco Use   Smoking status: Never   Smokeless tobacco: Never  Vaping Use   Vaping Use: Never used  Substance Use Topics   Alcohol use: No    Alcohol/week: 0.0 standard drinks   Drug use: No    ROS   Objective:   Vitals: BP 110/63 (BP Location: Right Arm)    Pulse 83    Temp 97.9 F (36.6 C) (Oral)    Resp 16    LMP  (LMP Unknown)    SpO2 93%   Physical Exam Constitutional:      General: She is not in acute distress.    Appearance: Normal appearance. She is well-developed. She is not ill-appearing, toxic-appearing or diaphoretic.  HENT:     Head: Normocephalic and atraumatic.     Nose: Nose normal.     Mouth/Throat:     Mouth: Mucous membranes are moist.  Eyes:     General: No scleral icterus.       Right eye: No discharge.        Left eye: No discharge.     Extraocular Movements: Extraocular movements intact.  Cardiovascular:     Rate and Rhythm: Normal rate.  Pulmonary:     Effort: Pulmonary effort is normal.  Musculoskeletal:     Left knee: No swelling, deformity, effusion, erythema, ecchymosis, lacerations, bony tenderness or crepitus. Normal range of motion. Tenderness present over the lateral joint line. No medial joint line or patellar tendon tenderness. Normal alignment and normal patellar mobility.     Left lower leg: No swelling, deformity, lacerations, tenderness or bony tenderness. No edema.     Left ankle: Swelling present. No deformity, ecchymosis or  lacerations. No tenderness. Normal range of motion.     Left foot: Decreased range of motion. Normal capillary refill. Swelling, tenderness and bony tenderness present. No deformity, laceration or crepitus.       Feet:     Comments: Ambulates gingerly favoring the left great toe.  Skin:    General: Skin is warm and dry.  Neurological:     General: No focal deficit present.     Mental Status: She is alert and oriented to person, place, and time.  Psychiatric:        Mood and Affect: Mood normal.        Behavior: Behavior normal.    DG Ankle Complete Left  Result Date: 06/18/2021 CLINICAL DATA:  Pain and swelling in the left foot and swelling of the left ankle for 2 days after fall at home. Pain is greatest in the left big toe. EXAM: LEFT ANKLE COMPLETE - 3+ VIEW; LEFT FOOT - COMPLETE 3+ VIEW COMPARISON:  None. FINDINGS: Cortical irregularity at the head of the great toe proximal phalanx, suggestive of a minimally displaced fracture, best appreciated on the AP view. The great toe IP joint spaces intact. Pin at the head of the great toe metatarsal; hardware is intact without evidence of complication. Small posterior and plantar calcaneal enthesophytes. No additional fractures in the foot or ankle. No ankle joint dislocation or joint effusion. Soft tissue swelling is noted about the ankle. Vascular calcifications in the visualized left lower leg and foot. IMPRESSION: Cortical irregularity at the head of the great toe proximal phalanx, suggestive of a minimally displaced fracture. No additional fractures in the foot or ankle. No ankle joint dislocation. Electronically Signed   By: Sherron AlesLaura  Parra M.D.   On: 06/18/2021 15:22  DG Foot Complete Left  Result Date: 06/18/2021 CLINICAL DATA:  Pain and swelling in the left foot and swelling of the left ankle for 2 days after fall at home. Pain is greatest in the left big toe. EXAM: LEFT ANKLE COMPLETE - 3+ VIEW; LEFT FOOT - COMPLETE 3+ VIEW COMPARISON:  None.  FINDINGS: Cortical irregularity at the head of the great toe proximal phalanx, suggestive of a minimally displaced fracture, best appreciated on the AP view. The great toe IP joint spaces intact. Pin at the head of the great toe metatarsal; hardware is intact without evidence of complication. Small posterior and plantar calcaneal enthesophytes. No additional fractures in the foot or ankle. No ankle joint dislocation or joint effusion. Soft tissue swelling is noted about the ankle. Vascular calcifications in the visualized left lower leg and foot. IMPRESSION: Cortical irregularity at the head of the great toe proximal phalanx, suggestive of a minimally displaced fracture. No additional fractures in the foot or ankle. No ankle joint dislocation. Electronically Signed   By: Sherron Ales M.D.   On: 06/18/2021 15:22    Patient placed into a short leg posterior splint.  Assessment and Plan :   PDMP not reviewed this encounter.  1. Closed displaced fracture of proximal phalanx of left great toe, initial encounter   2. Great toe pain, left   3. Traumatic ecchymosis of toe of left foot, initial encounter   4. Left ankle swelling   5. Acute pain of left knee    Patient is to wear the splint at all times, ambulate only when necessary using the cane.  No crutches.  Schedule Tylenol, use hydrocodone for breakthrough pain.  We will follow-up with Dr. Ardelle Anton asap. Counseled patient on potential for adverse effects with medications prescribed/recommended today, ER and return-to-clinic precautions discussed, patient verbalized understanding.    Wallis Bamberg, [place] 06/18/21 1546

## 2021-06-18 NOTE — Discharge Instructions (Addendum)
You have a mildly displaced toe fracture of the great toe.  This requires that you wear a short leg splint at all times.  Avoid bearing weight on the left foot as much as possible.  Follow-up with Dr. Ardelle Anton for further management of your toe fracture.  Use Tylenol for pain.  If Tylenol does not help you with your pain then go ahead and use hydrocodone.  Be careful with this medication as it is a narcotic pain medicine.  If Tylenol alone helps to control your pain then just use Tylenol.

## 2021-06-24 ENCOUNTER — Other Ambulatory Visit: Payer: Self-pay

## 2021-06-24 ENCOUNTER — Ambulatory Visit: Payer: Medicare Other | Admitting: Podiatry

## 2021-06-24 ENCOUNTER — Ambulatory Visit (INDEPENDENT_AMBULATORY_CARE_PROVIDER_SITE_OTHER): Payer: Medicare Other

## 2021-06-24 DIAGNOSIS — S9032XA Contusion of left foot, initial encounter: Secondary | ICD-10-CM

## 2021-06-24 DIAGNOSIS — S92405A Nondisplaced unspecified fracture of left great toe, initial encounter for closed fracture: Secondary | ICD-10-CM

## 2021-07-06 NOTE — Progress Notes (Signed)
? ?HPI: 88 y.o. female presenting today as a new patient for evaluation of an injury that occurred on 06/17/2021 when she tripped and she was taken to the urgent care and diagnosed with fracture.  She was referred here for further treatment and evaluation. ? ?Past Medical History:  ?Diagnosis Date  ? Anemia   ? iron  ? Anxiety   ? Aortic stenosis   ? Bioprosthetic AVR 2006  ? CAD (coronary artery disease), native coronary artery   ? Multivessel status post CABG 2006, occluded SVG to circumflex, DES to circumflex July 2020  ? Cardiomyopathy (HCC)   ? Chronic kidney disease   ? Depression   ? Dysphagia   ? Essential hypertension   ? Family history of adverse reaction to anesthesia   ? N&V  ? GERD (gastroesophageal reflux disease)   ? Headache   ? History of hiatal hernia   ? HOH (hard of hearing)   ? LBBB (left bundle branch block)   ? Mixed hyperlipidemia   ? NSTEMI (non-ST elevated myocardial infarction) (HCC)   ? July 2020  ? Osteoarthritis   ? Postoperative nausea and vomiting   ? Morphine  ? Type 2 diabetes mellitus (HCC)   ? UTI (lower urinary tract infection)   ? ? ?Past Surgical History:  ?Procedure Laterality Date  ? ABDOMINAL HYSTERECTOMY    ? partial  ? AORTIC VALVE REPLACEMENT  2006  ? Dr. Cornelius Moras - 23 mm [place] stentless cadaver valve  ? CARDIAC CATHETERIZATION  2020  ? with stents  ? CHOLECYSTECTOMY    ? CORONARY ARTERY BYPASS GRAFT  2006   ? Dr. Cornelius Moras - LIMA to LAD, SVG to circumflex  ? CORONARY STENT INTERVENTION N/A 11/06/2018  ? Procedure: CORONARY STENT INTERVENTION;  Surgeon: Yvonne Kendall, MD;  [facility]: [facility];  Service: Cardiovascular;  Laterality: N/A;  ? CORONARY/GRAFT ANGIOGRAPHY N/A 11/06/2018  ? Procedure: CORONARY/GRAFT ANGIOGRAPHY;  Surgeon: Yvonne Kendall, MD;  Location: [facility];  Service: Cardiovascular;  Laterality: N/A;  ? LEFT HEART CATH AND CORS/GRAFTS ANGIOGRAPHY N/A 11/07/2020  ? Procedure: LEFT HEART CATH AND CORS/GRAFTS ANGIOGRAPHY;  Surgeon: Kathleene Hazel, MD;  Location: [facility];  Service: Cardiovascular;  Laterality: N/A;  ? THYROID LOBECTOMY Right   ? TOTAL HIP ARTHROPLASTY Right 03/01/2017  ? Procedure: RIGHT TOTAL HIP ARTHROPLASTY ANTERIOR APPROACH;  Surgeon: Durene Romans, MD;  Location: [facility];  Service: Orthopedics;  Laterality: Right;  70 mins  ? TOTAL HIP ARTHROPLASTY Left 04/29/2020  ? Procedure: TOTAL HIP ARTHROPLASTY ANTERIOR APPROACH;  Surgeon: Durene Romans, MD;  Location: [facility];  Service: Orthopedics;  Laterality: Left;  70 mins  ? TOTAL KNEE ARTHROPLASTY Right 2006  ? TOTAL KNEE ARTHROPLASTY Left 05/20/2014  ? DR Thurston Hole  ? TOTAL KNEE ARTHROPLASTY Left 05/20/2014  ? Procedure: LEFT TOTAL KNEE ARTHROPLASTY;  Surgeon: Nilda Simmer, MD;  Location: [facility];  Service: Orthopedics;  Laterality: Left;  ? ? ?Allergies  ?Allergen Reactions  ? Lipitor [Atorvastatin]   ?  MUSCLE ACHES/FATIGUE  ? Morphine Nausea And Vomiting  ? Penicillins Swelling and Other (See Comments)  ?  Tolerated Cephalosporin Date: 04/29/20. ? ?Severe arm swelling and redness ?Did it involve swelling of the face/tongue/throat, SOB, or low BP? No ?Did it involve sudden or severe rash/hives, skin peeling, or any reaction on the inside of your mouth or nose? No ?Did you need to seek medical attention at a hospital or doctor's office? No ?When did it  last happen?      30 years ?If all above answers are "NO", may proceed with cephalosporin use.  ? Reclast [Zoledronic Acid]   ?  "nausea and vomiting. Could not get out of bed.  ? ?  ?Physical Exam: ?General: The patient is alert and oriented x3 in no acute distress. ? ?Dermatology: Skin is warm, dry and supple bilateral lower extremities. Negative for open lesions or macerations. ? ?Vascular: Palpable pedal pulses bilaterally. Capillary refill within normal limits.  Mild ecchymosis noted around the forefoot ? ?Neurological: Light touch and protective threshold grossly intact ? ?Musculoskeletal Exam: No pedal deformities  noted.  Overall there is decent alignment of the great toe ? ?Radiographic Exam LT foot and ankle 06/18/2021 urgent care:  ?FINDINGS: ?Cortical irregularity at the head of the great toe proximal phalanx, ?suggestive of a minimally displaced fracture, best appreciated on ?the AP view. The great toe IP joint spaces intact. Pin at the head ?of the great toe metatarsal; hardware is intact without evidence of ?complication. Small posterior and plantar calcaneal enthesophytes. ?  ?No additional fractures in the foot or ankle. No ankle joint ?dislocation or joint effusion. Soft tissue swelling is noted about ?the ankle. Vascular calcifications in the visualized left lower leg ?and foot. ?  ?IMPRESSION: ?Cortical irregularity at the head of the great toe proximal phalanx, ?suggestive of a minimally displaced fracture. ?  ?No additional fractures in the foot or ankle. No ankle joint ?dislocation.   ? ?Assessment: ?1.  Fracture head of the proximal phalanx left hallux, closed, nondisplaced, initial encounter ? ? ?Plan of Care:  ?1. Patient evaluated. X-Rays reviewed that were taken at the urgent care.  ?2.  At this moment we are going to pursue conservative treatment.  No surgery warranted because of minimal displacement and overall decent alignment of the toe ?3.  Short cam boot dispensed.  Patient is minimally ambulatory.  We will allow her to be weightbearing in the cam boot since nonweightbearing may be more detrimental for the overall health of the patient ?4.  Recommend rest ice compression and elevation ?5.  Return to clinic in 4 weeks for follow-up x-ray ? ?  ?  ?Felecia Shelling, DPM ?[facility] ? ?Dr. Felecia Shelling, DPM  ?  ?[address]                                        ?[place], [place] [address]                ?Office [phone]  ?Fax [phone] ? ? ? ? ?

## 2021-07-22 ENCOUNTER — Ambulatory Visit (INDEPENDENT_AMBULATORY_CARE_PROVIDER_SITE_OTHER): Payer: Medicare Other

## 2021-07-22 ENCOUNTER — Ambulatory Visit: Payer: Medicare Other | Admitting: Podiatry

## 2021-07-22 DIAGNOSIS — S92405A Nondisplaced unspecified fracture of left great toe, initial encounter for closed fracture: Secondary | ICD-10-CM

## 2021-07-26 NOTE — Progress Notes (Signed)
? ?HPI: 88 y.o. female presenting today for follow-up evaluation of a toe fracture that occurred on 06/17/2021 when she tripped and she was taken to the urgent care and diagnosed with fracture.  Overall the patient states that she is doing well.  She only has minor pain.  She rates the pain 2/10 on a daily basis.  No new complaints at this time ?Past Medical History:  ?Diagnosis Date  ? Anemia   ? iron  ? Anxiety   ? Aortic stenosis   ? Bioprosthetic AVR 2006  ? CAD (coronary artery disease), native coronary artery   ? Multivessel status post CABG 2006, occluded SVG to circumflex, DES to circumflex July 2020  ? Cardiomyopathy (HCC)   ? Chronic kidney disease   ? Depression   ? Dysphagia   ? Essential hypertension   ? Family history of adverse reaction to anesthesia   ? N&V  ? GERD (gastroesophageal reflux disease)   ? Headache   ? History of hiatal hernia   ? HOH (hard of hearing)   ? LBBB (left bundle branch block)   ? Mixed hyperlipidemia   ? NSTEMI (non-ST elevated myocardial infarction) (HCC)   ? July 2020  ? Osteoarthritis   ? Postoperative nausea and vomiting   ? Morphine  ? Type 2 diabetes mellitus (HCC)   ? UTI (lower urinary tract infection)   ? ? ?Past Surgical History:  ?Procedure Laterality Date  ? ABDOMINAL HYSTERECTOMY    ? partial  ? AORTIC VALVE REPLACEMENT  2006  ? Dr. Cornelius Moras - 23 mm [place] stentless cadaver valve  ? CARDIAC CATHETERIZATION  2020  ? with stents  ? CHOLECYSTECTOMY    ? CORONARY ARTERY BYPASS GRAFT  2006   ? Dr. Cornelius Moras - LIMA to LAD, SVG to circumflex  ? CORONARY STENT INTERVENTION N/A 11/06/2018  ? Procedure: CORONARY STENT INTERVENTION;  Surgeon: Yvonne Kendall, MD;  [facility];  Service: Cardiovascular;  Laterality: N/A;  ? CORONARY/GRAFT ANGIOGRAPHY N/A 11/06/2018  ? Procedure: CORONARY/GRAFT ANGIOGRAPHY;  Surgeon: Yvonne Kendall, MD;  [facility];  Service: Cardiovascular;  Laterality: N/A;  ? LEFT HEART CATH AND CORS/GRAFTS ANGIOGRAPHY N/A  11/07/2020  ? Procedure: LEFT HEART CATH AND CORS/GRAFTS ANGIOGRAPHY;  Surgeon: Kathleene Hazel, MD;  [facility];  Service: Cardiovascular;  Laterality: N/A;  ? THYROID LOBECTOMY Right   ? TOTAL HIP ARTHROPLASTY Right 03/01/2017  ? Procedure: RIGHT TOTAL HIP ARTHROPLASTY ANTERIOR APPROACH;  Surgeon: Durene Romans, MD;  Location: [facility];  Service: Orthopedics;  Laterality: Right;  70 mins  ? TOTAL HIP ARTHROPLASTY Left 04/29/2020  ? Procedure: TOTAL HIP ARTHROPLASTY ANTERIOR APPROACH;  Surgeon: Durene Romans, MD;  Location: [facility];  Service: Orthopedics;  Laterality: Left;  70 mins  ? TOTAL KNEE ARTHROPLASTY Right 2006  ? TOTAL KNEE ARTHROPLASTY Left 05/20/2014  ? DR Thurston Hole  ? TOTAL KNEE ARTHROPLASTY Left 05/20/2014  ? Procedure: LEFT TOTAL KNEE ARTHROPLASTY;  Surgeon: Nilda Simmer, MD;  Location: [facility];  Service: Orthopedics;  Laterality: Left;  ? ? ?Allergies  ?Allergen Reactions  ? Lipitor [Atorvastatin]   ?  MUSCLE ACHES/FATIGUE  ? Morphine Nausea And Vomiting  ? Penicillins Swelling and Other (See Comments)  ?  Tolerated Cephalosporin Date: 04/29/20. ? ?Severe arm swelling and redness ?Did it involve swelling of the face/tongue/throat, SOB, or low BP? No ?Did it involve sudden or severe rash/hives, skin peeling, or any reaction on the inside of your mouth  or nose? No ?Did you need to seek medical attention at a hospital or doctor's office? No ?When did it last happen?      30 years ?If all above answers are "NO", may proceed with cephalosporin use.  ? Reclast [Zoledronic Acid]   ?  "nausea and vomiting. Could not get out of bed.  ? ?  ?Physical Exam: ?General: The patient is alert and oriented x3 in no acute distress. ? ?Dermatology: Skin is warm, dry and supple bilateral lower extremities. Negative for open lesions or macerations. ? ?Vascular: Palpable pedal pulses bilaterally. Capillary refill within normal limits.  Mild ecchymosis noted around the forefoot ? ?Neurological: Light touch  and protective threshold grossly intact ? ?Musculoskeletal Exam: No pedal deformities noted.  Overall there is decent alignment of the great toe.  Minimal tenderness to palpation ? ?Radiographic Exam LT foot: ?X-rays compared to last x-rays taken which demonstrates good routine healing of the fracture fragment.  This should continue to heal uneventfully ? ?Assessment: ?1.  Fracture head of the proximal phalanx left hallux, closed, nondisplaced, initial encounter ? ? ?Plan of Care:  ?1. Patient evaluated. ?2.  Patient may discontinue the cam boot.  She may resume good supportive tennis shoes and sneakers that do not irritate the toe ?3.  Explained that she may have some tenderness and achiness over the next few months as it completely heals. ?4.  Return to clinic as needed ?  ?  ?Felecia Shelling, DPM ?[facility] ? ?Dr. Felecia Shelling, DPM  ?  ?[address]                                        ?[place], [place] [address]                ?Office [phone]  ?Fax [phone] ? ? ? ? ?

## 2021-07-28 ENCOUNTER — Emergency Department (HOSPITAL_COMMUNITY): Payer: Medicare Other

## 2021-07-28 ENCOUNTER — Telehealth: Payer: Self-pay | Admitting: Cardiology

## 2021-07-28 ENCOUNTER — Observation Stay (HOSPITAL_COMMUNITY)
Admission: EM | Admit: 2021-07-28 | Discharge: 2021-07-30 | Disposition: A | Discharge status: Home / Self Care | Payer: Medicare Other | Attending: Internal Medicine | Admitting: Internal Medicine

## 2021-07-28 ENCOUNTER — Other Ambulatory Visit: Payer: Self-pay

## 2021-07-28 ENCOUNTER — Encounter (HOSPITAL_COMMUNITY): Payer: Self-pay

## 2021-07-28 DIAGNOSIS — Z7982 Long term (current) use of aspirin: Secondary | ICD-10-CM | POA: Insufficient documentation

## 2021-07-28 DIAGNOSIS — Z96643 Presence of artificial hip joint, bilateral: Secondary | ICD-10-CM | POA: Diagnosis not present

## 2021-07-28 DIAGNOSIS — Z952 Presence of prosthetic heart valve: Secondary | ICD-10-CM | POA: Insufficient documentation

## 2021-07-28 DIAGNOSIS — I130 Hypertensive heart and chronic kidney disease with heart failure and stage 1 through stage 4 chronic kidney disease, or unspecified chronic kidney disease: Secondary | ICD-10-CM | POA: Diagnosis not present

## 2021-07-28 DIAGNOSIS — R0789 Other chest pain: Secondary | ICD-10-CM | POA: Diagnosis present

## 2021-07-28 DIAGNOSIS — Z951 Presence of aortocoronary bypass graft: Secondary | ICD-10-CM | POA: Insufficient documentation

## 2021-07-28 DIAGNOSIS — F32A Depression, unspecified: Secondary | ICD-10-CM

## 2021-07-28 DIAGNOSIS — I10 Essential (primary) hypertension: Secondary | ICD-10-CM | POA: Diagnosis present

## 2021-07-28 DIAGNOSIS — I5032 Chronic diastolic (congestive) heart failure: Secondary | ICD-10-CM | POA: Insufficient documentation

## 2021-07-28 DIAGNOSIS — Z7984 Long term (current) use of oral hypoglycemic drugs: Secondary | ICD-10-CM | POA: Insufficient documentation

## 2021-07-28 DIAGNOSIS — R079 Chest pain, unspecified: Secondary | ICD-10-CM | POA: Diagnosis present

## 2021-07-28 DIAGNOSIS — R072 Precordial pain: Secondary | ICD-10-CM | POA: Diagnosis not present

## 2021-07-28 DIAGNOSIS — Z79899 Other long term (current) drug therapy: Secondary | ICD-10-CM | POA: Diagnosis not present

## 2021-07-28 DIAGNOSIS — E1122 Type 2 diabetes mellitus with diabetic chronic kidney disease: Secondary | ICD-10-CM | POA: Insufficient documentation

## 2021-07-28 DIAGNOSIS — E876 Hypokalemia: Secondary | ICD-10-CM | POA: Insufficient documentation

## 2021-07-28 DIAGNOSIS — Z96653 Presence of artificial knee joint, bilateral: Secondary | ICD-10-CM | POA: Insufficient documentation

## 2021-07-28 DIAGNOSIS — I2510 Atherosclerotic heart disease of native coronary artery without angina pectoris: Secondary | ICD-10-CM | POA: Insufficient documentation

## 2021-07-28 DIAGNOSIS — N189 Chronic kidney disease, unspecified: Secondary | ICD-10-CM | POA: Insufficient documentation

## 2021-07-28 DIAGNOSIS — K219 Gastro-esophageal reflux disease without esophagitis: Secondary | ICD-10-CM | POA: Diagnosis present

## 2021-07-28 DIAGNOSIS — E785 Hyperlipidemia, unspecified: Secondary | ICD-10-CM | POA: Diagnosis present

## 2021-07-28 LAB — BASIC METABOLIC PANEL
Anion gap: 11 (ref 5–15)
BUN: 31 mg/dL — ABNORMAL HIGH (ref 8–23)
CO2: 26 mmol/L (ref 22–32)
Calcium: 9 mg/dL (ref 8.9–10.3)
Chloride: 97 mmol/L — ABNORMAL LOW (ref 98–111)
Creatinine, Ser: 0.82 mg/dL (ref 0.44–1.00)
GFR, Estimated: >60 mL/min (ref >60)
Glucose, Bld: 110 mg/dL — ABNORMAL HIGH (ref 70–99)
Potassium: 3.3 mmol/L — ABNORMAL LOW (ref 3.5–5.1)
Sodium: 134 mmol/L — ABNORMAL LOW (ref 135–145)

## 2021-07-28 LAB — CBC
HCT: 31.1 % — ABNORMAL LOW (ref 36.0–46.0)
Hemoglobin: 10.4 g/dL — ABNORMAL LOW (ref 12.0–15.0)
MCH: 31.7 pg (ref 26.0–34.0)
MCHC: 33.4 g/dL (ref 30.0–36.0)
MCV: 94.8 fL (ref 80.0–100.0)
Platelets: 216 10*3/uL (ref 150–400)
RBC: 3.28 MIL/uL — ABNORMAL LOW (ref 3.87–5.11)
RDW: 11.9 % (ref 11.5–15.5)
WBC: 6.1 10*3/uL (ref 4.0–10.5)
nRBC: 0 % (ref 0.0–0.2)

## 2021-07-28 LAB — TROPONIN I (HIGH SENSITIVITY): Troponin I (High Sensitivity): 29 ng/L — ABNORMAL HIGH (ref <18)

## 2021-07-28 LAB — BRAIN NATRIURETIC PEPTIDE: B Natriuretic Peptide: 286 pg/mL — ABNORMAL HIGH (ref 0.0–100.0)

## 2021-07-28 MED ORDER — ROSUVASTATIN CALCIUM 10 MG PO TABS
5.0000 mg | ORAL_TABLET | Freq: Every day | ORAL | Status: DC
  Administered 2021-07-29 – 2021-07-30 (×2): 5 mg via ORAL
  Filled 2021-07-28 (×2): qty 1

## 2021-07-28 MED ORDER — SPIRONOLACTONE 25 MG PO TABS
12.5000 mg | ORAL_TABLET | Freq: Every day | ORAL | Status: DC
  Administered 2021-07-29: 12.5 mg via ORAL
  Filled 2021-07-28: qty 0.5
  Filled 2021-07-28: qty 1
  Filled 2021-07-28: qty 0.5

## 2021-07-28 MED ORDER — METOPROLOL TARTRATE 50 MG PO TABS
50.0000 mg | ORAL_TABLET | Freq: Two times a day (BID) | ORAL | Status: DC
  Administered 2021-07-28: 50 mg via ORAL
  Filled 2021-07-28: qty 1

## 2021-07-28 MED ORDER — HEPARIN SODIUM (PORCINE) 5000 UNIT/ML IJ SOLN
5000.0000 [IU] | Freq: Three times a day (TID) | INTRAMUSCULAR | Status: DC
  Administered 2021-07-28 – 2021-07-30 (×5): 5000 [IU] via SUBCUTANEOUS
  Filled 2021-07-28 (×5): qty 1

## 2021-07-28 MED ORDER — MORPHINE SULFATE (PF) 2 MG/ML IV SOLN: 2.0000 mg | Freq: Once | INTRAVENOUS | Status: DC

## 2021-07-28 MED ORDER — ONDANSETRON HCL 4 MG/2ML IJ SOLN: 4.0000 mg | Freq: Four times a day (QID) | INTRAMUSCULAR | Status: DC | PRN

## 2021-07-28 MED ORDER — FAMOTIDINE 20 MG PO TABS
10.0000 mg | ORAL_TABLET | Freq: Two times a day (BID) | ORAL | Status: DC
  Administered 2021-07-28 – 2021-07-30 (×4): 10 mg via ORAL
  Filled 2021-07-28 (×4): qty 1

## 2021-07-28 MED ORDER — SERTRALINE HCL 50 MG PO TABS
100.0000 mg | ORAL_TABLET | Freq: Every day | ORAL | Status: DC
  Administered 2021-07-28 – 2021-07-29 (×2): 100 mg via ORAL
  Filled 2021-07-28 (×2): qty 2

## 2021-07-28 MED ORDER — RANOLAZINE ER 500 MG PO TB12
500.0000 mg | ORAL_TABLET | Freq: Two times a day (BID) | ORAL | Status: DC
  Administered 2021-07-28: 500 mg via ORAL
  Filled 2021-07-28: qty 1

## 2021-07-28 MED ORDER — LEVOTHYROXINE SODIUM 88 MCG PO TABS
88.0000 ug | ORAL_TABLET | Freq: Every day | ORAL | Status: DC
  Administered 2021-07-29 – 2021-07-30 (×2): 88 ug via ORAL
  Filled 2021-07-28 (×2): qty 1

## 2021-07-28 MED ORDER — ISOSORBIDE MONONITRATE ER 30 MG PO TB24: 30.0000 mg | ORAL_TABLET | Freq: Every day | ORAL | Status: DC

## 2021-07-28 MED ORDER — ASPIRIN EC 81 MG PO TBEC
81.0000 mg | DELAYED_RELEASE_TABLET | Freq: Every day | ORAL | Status: DC
  Administered 2021-07-29 – 2021-07-30 (×2): 81 mg via ORAL
  Filled 2021-07-28 (×2): qty 1

## 2021-07-28 MED ORDER — ACETAMINOPHEN 325 MG PO TABS: 650.0000 mg | ORAL_TABLET | ORAL | Status: DC | PRN

## 2021-07-28 NOTE — Telephone Encounter (Signed)
Pt c/o of Chest Pain: STAT if CP now or developed within 24 hours ? ?1. Are you having CP right now? no ? ?2. Are you experiencing any other symptoms (ex. SOB, nausea, vomiting, sweating)? no ? ?3. How long have you been experiencing CP? Sunday ? ?4. Is your CP continuous or coming and going? Came and went, 4 times ? ?5. Have you taken Nitroglycerin? No, takes isosorbide and an aspirin when she has it. ? ? ?Patient's daughter states the patient has had 4 angina attacks since Sunday.  ??  ?

## 2021-07-28 NOTE — ED Triage Notes (Signed)
Pt presents with chest pain that has been on and off since Sunday. States that it it feels like pressure. Pt with extensive cardiac history. Pt states she also has had some swelling in her stomach. ?

## 2021-07-28 NOTE — Telephone Encounter (Signed)
Reports since Sunday afternoon, having intermittent angina attacks rated 5/10. Denies SOB or dizziness. Denies active chest pain. Reports taking imdur 30 mg each time she had an episode of angina. Has not checked Blood pressure. Reports last episode was this morning and HR was 108. HR now 91. Advised message would be sent to provider.  ?

## 2021-07-28 NOTE — Telephone Encounter (Signed)
Daughter- [name] is calling to report that her mom has been having recurrent chest pain since Sunday, >5 episodes, most of them are stable angina (with exertion) but the last one occurred at rest ? - she has been taking Imdur tablets everytime she gets chest pain (pressure)- not able to tolerate SL nitro as it makes her dizzy and feel bad and low BP. ?- her HR was elevaed at 108 and BP was also elevated at 157/80s ? ?I reviewed her CATH from 7/22: she has multivessel CAD, she is s/p CABG and only LIMA to LAD is open, has severe native dx which are not amenable for PCI thus she has been on medical therapy ? ? - I recommend them to go to the ER. Get EKG, Trop, labs and intensify medical therapy- Increase betablocker and start Ranexa 1gm BID.  ?Daughter, [name] is [place] Sp[place] take her to [facility] ER for eval  ? ?Renae Fickle, MD ?Cardiology coverage ?

## 2021-07-28 NOTE — ED Notes (Signed)
Patient transported to X-ray 

## 2021-07-28 NOTE — ED Notes (Signed)
Pt's daughter left to go home.

## 2021-07-28 NOTE — ED Provider Notes (Signed)
? ?Emergency Department Provider Note ? ? ?I have reviewed the triage vital signs and the nursing notes. ? ? ?HISTORY ? ?Chief Complaint ?Chest Pain ? ? ?HPI ?Erin Herman is a 88 y.o. female with PMH reviewed below including CAD s/p multiple PCI and CABG now on medical mgmt presents to the ED with CP.  Patient has had intermittent episodes of chest heaviness/tightness since Sunday evening.  Daughter at bedside notes 2 episodes today.  Patient is very sensitive to nitroglycerin, drops her blood pressure, and so she does not take this at home.  She takes the isosorbide (7.5 mg) PRN and has taken it twice today without lasting relief.  She denies any diaphoresis or nausea.  No shortness of breath.  She does feel like shifting onto some fluid in her abdomen. No fever or cough. No abdominal pain.  ? ? ?Past Medical History:  ?Diagnosis Date  ? Anemia   ? iron  ? Anxiety   ? Aortic stenosis   ? Bioprosthetic AVR 2006  ? CAD (coronary artery disease), native coronary artery   ? Multivessel status post CABG 2006, occluded SVG to circumflex, DES to circumflex July 2020  ? Cardiomyopathy ([place])   ? Chronic kidney disease   ? Depression   ? Dysphagia   ? Essential hypertension   ? Family history of adverse reaction to anesthesia   ? N&V  ? GERD (gastroesophageal reflux disease)   ? Headache   ? History of hiatal hernia   ? HOH (hard of hearing)   ? LBBB (left bundle branch block)   ? Mixed hyperlipidemia   ? NSTEMI (non-ST elevated myocardial infarction) ([place])   ? July 2020  ? Osteoarthritis   ? Postoperative nausea and vomiting   ? Morphine  ? Type 2 diabetes mellitus ([place])   ? UTI (lower urinary tract infection)   ? ? ?Review of Systems ? ?Constitutional: No fever/chills ?Eyes: No visual changes. ?ENT: No sore throat. ?Cardiovascular: Positive chest pain. ?Respiratory: Denies shortness of breath. ?Gastrointestinal: No abdominal pain.  No nausea, no vomiting.  No diarrhea.  No constipation. ?Genitourinary: Negative  for dysuria. ?Musculoskeletal: Negative for back pain. ?Skin: Negative for rash. ?Neurological: Negative for headaches. ? ?____________________________________________ ? ? ?PHYSICAL EXAM: ? ?VITAL SIGNS: ?ED Triage Vitals  ?Enc Vitals Group  ?   BP 07/28/21 2132 111/68  ?   Pulse Rate 07/28/21 2132 89  ?   Resp 07/28/21 2132 18  ?   Temp 07/28/21 2132 98.4 ?F (36.9 ?C)  ?   Temp Source 07/28/21 2132 Oral  ?   SpO2 07/28/21 2132 96 %  ?   Weight 07/28/21 2130 132 lb (59.9 kg)  ?   Height 07/28/21 2130 5\' 4"  (1.626 m)  ? ?Constitutional: Alert and oriented. Well appearing and in no acute distress. ?Eyes: Conjunctivae are normal.  ?Head: Atraumatic. ?Nose: No congestion/rhinnorhea. ?Mouth/Throat: Mucous membranes are moist.   ?Neck: No stridor.   ?Cardiovascular: Normal rate, regular rhythm. Good peripheral circulation. Grossly normal heart sounds.   ?Respiratory: Normal respiratory effort.  No retractions. Lungs CTAB. ?Gastrointestinal: Soft and nontender. Mild distention.  ?Musculoskeletal: No lower extremity tenderness nor edema. No gross deformities of extremities. ?Neurologic:  Normal speech and language. No gross focal neurologic deficits are appreciated.  ?Skin:  Skin is warm, dry and intact. No rash noted. ? ? ?____________________________________________ ?  ?LABS ?(all labs ordered are listed, but only abnormal results are displayed) ? ?Labs Reviewed  ?BASIC METABOLIC PANEL - Abnormal; Notable  for the following components:  ?    Result Value  ? Sodium 134 (*)   ? Potassium 3.3 (*)   ? Chloride 97 (*)   ? Glucose, Bld 110 (*)   ? BUN 31 (*)   ? All other components within normal limits  ?CBC - Abnormal; Notable for the following components:  ? RBC 3.28 (*)   ? Hemoglobin 10.4 (*)   ? HCT 31.1 (*)   ? All other components within normal limits  ?BRAIN NATRIURETIC PEPTIDE - Abnormal; Notable for the following components:  ? B Natriuretic Peptide 286.0 (*)   ? All other components within normal limits  ?TROPONIN I  (HIGH SENSITIVITY) - Abnormal; Notable for the following components:  ? Troponin I (High Sensitivity) 29 (*)   ? All other components within normal limits  ?TROPONIN I (HIGH SENSITIVITY)  ? ?____________________________________________ ? ?EKG ? ? EKG Interpretation ? ?Date/Time:  Tuesday July 28 2021 21:33:00 EDT ?Ventricular Rate:  91 ?PR Interval:    ?QRS Duration: 161 ?QT Interval:  421 ?QTC Calculation: 518 ?R Axis:   130 ?Text Interpretation: Atrial fibrillation Nonspecific intraventricular conduction delay Borderline ST depression, diffuse leads Similar to Sep 2022 tracing Confirmed by Alona Bene [phone]) on 07/28/2021 9:49:11 PM ?  ? ?  ? ? ?____________________________________________ ? ?RADIOLOGY ? ?DG Chest 2 View ? ?Result Date: 07/28/2021 ?CLINICAL DATA:  Chest pain and pressure. EXAM: CHEST - 2 VIEW COMPARISON:  Portable chest 12/29/2020. FINDINGS: There is mild cardiomegaly, old CABG, aortic calcification and tortuosity with stable mediastinum. No vascular congestion is seen. The lungs are slightly emphysematous but clear. The sulci are sharp. Osteopenia and thoracic spondylosis. IMPRESSION: No active cardiopulmonary disease. Stable post CABG chest with mild cardiomegaly. Aortic atherosclerosis. COPD. Electronically Signed   By: Almira Bar M.D.   On: 07/28/2021 22:22   ? ?____________________________________________ ? ? ?PROCEDURES ? ?Procedure(s) performed:  ? ?Procedures ? ?None  ?____________________________________________ ? ? ?INITIAL IMPRESSION / ASSESSMENT AND PLAN / ED COURSE ? ?Pertinent labs & imaging results that were available during my care of the patient were reviewed by me and considered in my medical decision making (see chart for details). ?  ?This patient is Presenting for Evaluation of CP, which does require a range of treatment options, and is a complaint that involves a high risk of morbidity and mortality. ? ?The Differential Diagnoses includes but is not exclusive to acute  coronary syndrome, aortic dissection, pulmonary embolism, cardiac tamponade, community-acquired pneumonia, pericarditis, musculoskeletal chest wall pain, etc. ? ? ?Critical Interventions-  ?  ?Medications  ?ranolazine (RANEXA) 12 hr tablet 500 mg (has no administration in time range)  ? ? ?Reassessment after intervention: Patient without active pain. ? ?I did obtain Additional Historical Information from daughter at bedside. ? ?I decided to review pertinent External Data, and in summary patient's last cath in July 2022:  ? ?  Mid LM lesion is 30% stenosed. ?  Ost LAD lesion is 25% stenosed. ?  Prox Cx to Mid Cx lesion is 50% stenosed. ?  Mid RCA lesion is 100% stenosed. ?  Origin to Prox Graft lesion is 100% stenosed. ?  LPAV lesion is 40% stenosed. ?  1st Mrg lesion is 95% stenosed. ?  Prox LAD to Mid LAD lesion is 99% stenosed. ?  Previously placed Ost Cx to Prox Cx stent (unknown type) is  widely patent. ?  SVG graft was not injected. ?  LIMA and is normal in caliber. ?  The graft  exhibits no disease. ?  ?Severe triple vessel CAD s/p CABG with 1/2 patent bypass grafts ?Chronic sub-total occlusion of the mid LAD. The mid and distal LAD fills from the patent LIMA graft ?Patent proximal Circumflex stent. Beyond the stent there is a moderate stenosis in the AV groove Circumflex stent. The moderate caliber obtuse marginal branch has a Jaxyn Mestas segment of severe stenosis extending from the ostium into the mid vessel. This has been present since her last cath. The vessel had been bypassed. The vein graft to the obtuse marginal branch is known to be occluded. The obtuse marginal branch is not a favorable target for PCI.  ?The RCA is a small non-dominant vessel. The vessel is chronically occluded in the mid segment. The distal vessel fills from right to right collaterals.  ?  ?Recommendations: No good options for PCI. I would not approach the OM branch with PCI as this would likely compromise flow into the AV groove  Circumflex which supplies a dominant lateral wall system. Continue medical management of CAD.  ?  ?Clinical Laboratory Tests Ordered, included troponin mildly elevated at 29. No AKI. BNP mildly elevated at 286. ? ?R

## 2021-07-29 ENCOUNTER — Observation Stay (HOSPITAL_BASED_OUTPATIENT_CLINIC_OR_DEPARTMENT_OTHER): Payer: Medicare Other

## 2021-07-29 DIAGNOSIS — K219 Gastro-esophageal reflux disease without esophagitis: Secondary | ICD-10-CM | POA: Diagnosis not present

## 2021-07-29 DIAGNOSIS — I10 Essential (primary) hypertension: Secondary | ICD-10-CM | POA: Diagnosis not present

## 2021-07-29 DIAGNOSIS — I2510 Atherosclerotic heart disease of native coronary artery without angina pectoris: Secondary | ICD-10-CM

## 2021-07-29 DIAGNOSIS — R079 Chest pain, unspecified: Secondary | ICD-10-CM | POA: Diagnosis not present

## 2021-07-29 DIAGNOSIS — I5032 Chronic diastolic (congestive) heart failure: Secondary | ICD-10-CM

## 2021-07-29 DIAGNOSIS — E782 Mixed hyperlipidemia: Secondary | ICD-10-CM

## 2021-07-29 DIAGNOSIS — F32A Depression, unspecified: Secondary | ICD-10-CM

## 2021-07-29 DIAGNOSIS — I200 Unstable angina: Secondary | ICD-10-CM

## 2021-07-29 DIAGNOSIS — I5020 Unspecified systolic (congestive) heart failure: Secondary | ICD-10-CM | POA: Diagnosis not present

## 2021-07-29 DIAGNOSIS — E876 Hypokalemia: Secondary | ICD-10-CM

## 2021-07-29 DIAGNOSIS — I25110 Atherosclerotic heart disease of native coronary artery with unstable angina pectoris: Secondary | ICD-10-CM | POA: Diagnosis not present

## 2021-07-29 DIAGNOSIS — I259 Chronic ischemic heart disease, unspecified: Secondary | ICD-10-CM

## 2021-07-29 LAB — TROPONIN I (HIGH SENSITIVITY)
Troponin I (High Sensitivity): 36 ng/L — ABNORMAL HIGH (ref <18)
Troponin I (High Sensitivity): 37 ng/L — ABNORMAL HIGH (ref <18)

## 2021-07-29 LAB — ECHOCARDIOGRAM LIMITED
Calc EF: 45.7 %
Height: 64 in
S' Lateral: 3.5 cm
Single Plane A2C EF: 47.3 %
Single Plane A4C EF: 43 %
Weight: 2112 oz

## 2021-07-29 LAB — GLUCOSE, CAPILLARY
Glucose-Capillary: 118 mg/dL — ABNORMAL HIGH (ref 70–99)
Glucose-Capillary: 175 mg/dL — ABNORMAL HIGH (ref 70–99)

## 2021-07-29 LAB — CBG MONITORING, ED: Glucose-Capillary: 159 mg/dL — ABNORMAL HIGH (ref 70–99)

## 2021-07-29 LAB — LIPID PANEL
Cholesterol: 139 mg/dL (ref 0–200)
HDL: 40 mg/dL — ABNORMAL LOW (ref >40)
LDL Cholesterol: 80 mg/dL (ref 0–99)
Total CHOL/HDL Ratio: 3.5 RATIO
Triglycerides: 93 mg/dL (ref <150)
VLDL: 19 mg/dL (ref 0–40)

## 2021-07-29 MED ORDER — FUROSEMIDE 10 MG/ML IJ SOLN
40.0000 mg | Freq: Once | INTRAMUSCULAR | Status: AC
  Administered 2021-07-29: 40 mg via INTRAVENOUS
  Filled 2021-07-29: qty 4

## 2021-07-29 MED ORDER — RANOLAZINE ER 500 MG PO TB12: 1000.0000 mg | ORAL_TABLET | Freq: Two times a day (BID) | ORAL | Status: DC

## 2021-07-29 MED ORDER — INSULIN ASPART 100 UNIT/ML IJ SOLN
0.0000 [IU] | Freq: Three times a day (TID) | INTRAMUSCULAR | Status: DC
  Administered 2021-07-29: 3 [IU] via SUBCUTANEOUS
  Administered 2021-07-30: 2 [IU] via SUBCUTANEOUS
  Administered 2021-07-30: 3 [IU] via SUBCUTANEOUS
  Filled 2021-07-29: qty 1

## 2021-07-29 MED ORDER — POTASSIUM CHLORIDE 20 MEQ PO PACK
20.0000 meq | PACK | Freq: Once | ORAL | Status: AC
  Administered 2021-07-29: 20 meq via ORAL
  Filled 2021-07-29: qty 1

## 2021-07-29 MED ORDER — METOPROLOL SUCCINATE ER 25 MG PO TB24
25.0000 mg | ORAL_TABLET | Freq: Every day | ORAL | Status: DC
  Administered 2021-07-29 – 2021-07-30 (×2): 25 mg via ORAL
  Filled 2021-07-29 (×2): qty 1

## 2021-07-29 MED ORDER — RANOLAZINE ER 500 MG PO TB12
500.0000 mg | ORAL_TABLET | Freq: Two times a day (BID) | ORAL | Status: DC
  Administered 2021-07-29 – 2021-07-30 (×3): 500 mg via ORAL
  Filled 2021-07-29 (×3): qty 1

## 2021-07-29 NOTE — ED Notes (Signed)
Pt's pure wick failed. Changed full bed linen, replaced purewick and placed brief on pt. Pt comfortable in bed.  ?

## 2021-07-29 NOTE — ED Notes (Signed)
Took pt a glass of water ok per RN LL ?

## 2021-07-29 NOTE — ED Notes (Signed)
Echo in progress.

## 2021-07-29 NOTE — Assessment & Plan Note (Signed)
Continue aspirin, Imdur, Crestor, spironolactone and increase beta-blocker ? ?

## 2021-07-29 NOTE — Assessment & Plan Note (Signed)
Continue Pepcid  

## 2021-07-29 NOTE — Progress Notes (Signed)
Per HPI: ?Erin Herman is a 88 y.o. female with medical history significant of with history of anemia, coronary artery disease status post CABG, cardiomyopathy, chronic kidney disease, essential hypertension, GERD, mixed hyperlipidemia, type 2 diabetes, and more presents the ED with a chief complaint of chest pain.  Patient describes the pain as a substernal pressure.  It started midmorning while she was doing house chores.  She does not think her house chores were particularly strenuous or like she was exerting herself.  The pressure was constant until it went away in the ER.  She is not sure what made it go away in the ER.  She denies any associated shortness of breath, nausea, diaphoresis, dizziness.  She does admit to palpitations.  She reports that the pain did not radiate at all.  She has had a heart attack before and she reports that this was not nearly as bad as it was when she had a heart attack.  Patient has been under a lot of stress at home with her husband in hospice, and her daughter living with them trying to help take care of them.  She lastly complains about feeling like her abdomen has fluid on it.  She reports when she has fluid retention its in her face and her abdomen never in her legs.  Patient has no other complaints at this time. ?Patient does not smoke, does not drink, does not use illicit drugs.  She is vaccinated for COVID.  Patient is DNI. ? ?-Patient has been admitted for evaluation of chest pain in the setting of multivessel CAD.  She has been seen by cardiology with plans to initiate Ranexa 500 mg twice daily.  2D echocardiogram ordered and pending for evaluation.  Plan is to continue other medications to include beta-blocker, aspirin, and statin.  Anticipate conservative management for now.  She was also given Lasix on admission, follow diuresis. ? ?Total care time: 30 minutes. ?

## 2021-07-29 NOTE — H&P (Signed)
?History and Physical  ? ? ?Patient: Erin Herman [id number] DOB: 09-Apr-1928 ?DOA: 07/28/2021 ?DOS: the patient was seen and examined on 07/29/2021 ?PCP: Kirstie PeriShah, Ashish, MD  ?Patient coming from: Home ? ?Chief Complaint:  ?Chief Complaint  ?Patient presents with  ? Chest Pain  ? ?HPI: Erin Herman is a 88 y.o. female with medical history significant of with history of anemia, coronary artery disease status post CABG, cardiomyopathy, chronic kidney disease, essential hypertension, GERD, mixed hyperlipidemia, type 2 diabetes, and more presents the ED with a chief complaint of chest pain.  Patient describes the pain as a substernal pressure.  It started midmorning while she was doing house chores.  She does not think her house chores were particularly strenuous or like she was exerting herself.  The pressure was constant until it went away in the ER.  She is not sure what made it go away in the ER.  She denies any associated shortness of breath, nausea, diaphoresis, dizziness.  She does admit to palpitations.  She reports that the pain did not radiate at all.  She has had a heart attack before and she reports that this was not nearly as bad as it was when she had a heart attack.  Patient has been under a lot of stress at home with her husband in hospice, and her daughter living with them trying to help take care of them.  She lastly complains about feeling like her abdomen has fluid on it.  She reports when she has fluid retention its in her face and her abdomen never in her legs.  Patient has no other complaints at this time. ?Patient does not smoke, does not drink, does not use illicit drugs.  She is vaccinated for COVID.  Patient is DNI. ?Review of Systems: As mentioned in the history of present illness. All other systems reviewed and are negative. ?Past Medical History:  ?Diagnosis Date  ? Anemia   ? iron  ? Anxiety   ? Aortic stenosis   ? Bioprosthetic AVR 2006  ? CAD (coronary artery disease), native  coronary artery   ? Multivessel status post CABG 2006, occluded SVG to circumflex, DES to circumflex July 2020  ? Cardiomyopathy (HCC)   ? Chronic kidney disease   ? Depression   ? Dysphagia   ? Essential hypertension   ? Family history of adverse reaction to anesthesia   ? N&V  ? GERD (gastroesophageal reflux disease)   ? Headache   ? History of hiatal hernia   ? HOH (hard of hearing)   ? LBBB (left bundle branch block)   ? Mixed hyperlipidemia   ? NSTEMI (non-ST elevated myocardial infarction) (HCC)   ? July 2020  ? Osteoarthritis   ? Postoperative nausea and vomiting   ? Morphine  ? Type 2 diabetes mellitus (HCC)   ? UTI (lower urinary tract infection)   ? ?Past Surgical History:  ?Procedure Laterality Date  ? ABDOMINAL HYSTERECTOMY    ? partial  ? AORTIC VALVE REPLACEMENT  2006  ? Dr. Cornelius Moraswen - 23 mm [place] stentless cadaver valve  ? CARDIAC CATHETERIZATION  2020  ? with stents  ? CHOLECYSTECTOMY    ? CORONARY ARTERY BYPASS GRAFT  2006   ? Dr. Cornelius Moraswen - LIMA to LAD, SVG to circumflex  ? CORONARY STENT INTERVENTION N/A 11/06/2018  ? Procedure: CORONARY STENT INTERVENTION;  Surgeon: Yvonne KendallEnd, Christopher, MD;  Location: [facility];  Service: Cardiovascular;  Laterality: N/A;  ? CORONARY/GRAFT ANGIOGRAPHY N/A 11/06/2018  ?  Procedure: CORONARY/GRAFT ANGIOGRAPHY;  Surgeon: Yvonne Kendall, MD;  Location: [facility];  Service: Cardiovascular;  Laterality: N/A;  ? LEFT HEART CATH AND CORS/GRAFTS ANGIOGRAPHY N/A 11/07/2020  ? Procedure: LEFT HEART CATH AND CORS/GRAFTS ANGIOGRAPHY;  Surgeon: Kathleene Hazel, MD;  Location: [facility];  Service: Cardiovascular;  Laterality: N/A;  ? THYROID LOBECTOMY Right   ? TOTAL HIP ARTHROPLASTY Right 03/01/2017  ? Procedure: RIGHT TOTAL HIP ARTHROPLASTY ANTERIOR APPROACH;  Surgeon: Durene Romans, MD;  Location: [facility];  Service: Orthopedics;  Laterality: Right;  70 mins  ? TOTAL HIP ARTHROPLASTY Left 04/29/2020  ? Procedure: TOTAL HIP ARTHROPLASTY ANTERIOR APPROACH;   Surgeon: Durene Romans, MD;  Location: [facility];  Service: Orthopedics;  Laterality: Left;  70 mins  ? TOTAL KNEE ARTHROPLASTY Right 2006  ? TOTAL KNEE ARTHROPLASTY Left 05/20/2014  ? DR Thurston Hole  ? TOTAL KNEE ARTHROPLASTY Left 05/20/2014  ? Procedure: LEFT TOTAL KNEE ARTHROPLASTY;  Surgeon: Nilda Simmer, MD;  Location: [facility];  Service: Orthopedics;  Laterality: Left;  ? ?Social History:  reports that she has never smoked. She has never used smokeless tobacco. She reports that she does not drink alcohol and does not use drugs. ? ?Allergies  ?Allergen Reactions  ? Lipitor [Atorvastatin]   ?  MUSCLE ACHES/FATIGUE  ? Morphine Nausea And Vomiting  ? Penicillins Swelling and Other (See Comments)  ?  Tolerated Cephalosporin Date: 04/29/20. ? ?Severe arm swelling and redness ?Did it involve swelling of the face/tongue/throat, SOB, or low BP? No ?Did it involve sudden or severe rash/hives, skin peeling, or any reaction on the inside of your mouth or nose? No ?Did you need to seek medical attention at a hospital or doctor's office? No ?When did it last happen?      30 years ?If all above answers are "NO", may proceed with cephalosporin use.  ? Reclast [Zoledronic Acid]   ?  "nausea and vomiting. Could not get out of bed.  ? ? ?Family History  ?Problem Relation Age of Onset  ? Heart Problems Mother   ? Diabetes Father   ? Cirrhosis Father   ? Diabetes Other   ? ? ?Prior to Admission medications   ?Medication Sig Start Date End Date Taking? Authorizing Provider  ?acetaminophen (TYLENOL) 325 MG tablet Take 2 tablets (650 mg total) by mouth every 6 (six) hours as needed. 06/18/21   Wallis Bamberg, PA-C  ?APPLE CIDER VINEGAR PO Take 1 tablet by mouth every morning.    [provider]  ?aspirin EC 81 MG tablet Take 1 tablet (81 mg total) by mouth daily. Swallow whole. 06/26/20   Jonelle Sidle, MD  ?Biotin w/ Vitamins C & E (HAIR/SKIN/NAILS PO) Take 1 tablet by mouth daily.    [provider]  ?Cholecalciferol  (VITAMIN D3) 125 MCG (5000 UT) CAPS Take 5,000 Units by mouth daily.    [provider]  ?CINNAMON PO Take 1 tablet by mouth 2 (two) times daily.    [provider]  ?Coenzyme Q10 (COQ10 PO) Take 1 tablet by mouth daily.    [provider]  ?CRANBERRY PO Take 1 tablet by mouth daily.    [provider]  ?Cyanocobalamin (B-12) 2500 MCG TABS Take 2,500 mcg by mouth daily.    [provider]  ?famotidine (PEPCID) 20 MG tablet Take 10 mg by mouth 2 (two) times daily.    [provider]  ?furosemide (LASIX) 40 MG tablet Take 1 tablet (40 mg  total) by mouth 2 (two) times daily. 12/17/19   Jonelle Sidle, MD  ?HYDROcodone-acetaminophen (NORCO/VICODIN) 5-325 MG tablet Take 1 tablet by mouth every 6 (six) hours as needed for severe pain. 06/18/21   Wallis Bamberg, PA-C  ?isosorbide mononitrate (IMDUR) 30 MG 24 hr tablet Take 7.5 mg by mouth daily ?Patient not taking: Reported on 06/08/2021 01/06/21   Netta Neat., NP  ?levothyroxine (SYNTHROID) 88 MCG tablet Take 88 mcg by mouth daily before breakfast.    [provider]  ?metFORMIN (GLUCOPHAGE) 500 MG tablet Take 1 tablet (500 mg total) by mouth 2 (two) times daily with a meal. 11/10/18   Edsel Petrin, DO  ?metoprolol succinate (TOPROL XL) 25 MG 24 hr tablet Take 1 tablet (25 mg total) by mouth daily. 01/07/21   Netta Neat., NP  ?Multiple Vitamins-Minerals (HAIR/SKIN/NAILS/BIOTIN) TABS Take 1 tablet by mouth daily.    [provider]  ?Polyethyl Glycol-Propyl Glycol (SYSTANE OP) Place 1 drop into both eyes as needed.    [provider]  ?potassium chloride SA (K-DUR,KLOR-CON) 20 MEQ tablet Take 1 tablet (20 mEq total) by mouth daily after supper. 01/26/17   Jonelle Sidle, MD  ?psyllium (METAMUCIL) 58.6 % packet Take 1 packet by mouth daily.    [provider]  ?rosuvastatin (CRESTOR) 5 MG tablet Take 1 tablet (5 mg total) by mouth daily. 01/06/21   Netta Neat.,  NP  ?sertraline (ZOLOFT) 100 MG tablet Take 100 mg by mouth at bedtime.    [provider]  ?spironolactone (ALDACTONE) 25 MG tablet Take 0.5 tablets (12.5 mg total) by mouth daily. 07/06/16   Freeman Caldron

## 2021-07-29 NOTE — TOC Progression Note (Signed)
Transition of Care (TOC) - Progression Note  ? ? ?Patient Details  ?Name: Erin Herman ?MRN: [id number] ?Date of Birth: [birth year] ? ?Transition of Care (TOC) CM/SW Contact  ?Salome Arnt, LCSW ?Phone Number: ?07/29/2021, 12:56 PM ? ?Clinical Narrative:    ?Transition of Care (TOC) Screening Note ? ? ?Patient Details  ?Name: Erin Herman ?Date of Birth: 05-Dec-1927 ? ? ?Transition of Care (TOC) CM/SW Contact:    ?Salome Arnt, LCSW ?Phone Number: ?07/29/2021, 12:56 PM ? ? ? ?Transition of Care Department [facility]) has reviewed patient and no TOC needs have been identified at this time. We will continue to monitor patient advancement through interdisciplinary progression rounds. If new patient transition needs arise, please place a TOC consult. ?  ? ? ? ?  ?  ? ?Expected Discharge Plan and Services ?  ?  ?  ?  ?  ?                ?  ?  ?  ?  ?  ?  ?  ?  ?  ?  ? ? ?Social Determinants of Health (SDOH) Interventions ?  ? ?Readmission Risk Interventions ?   ? View : No data to display.  ?  ?  ?  ? ? ?

## 2021-07-29 NOTE — ED Notes (Signed)
Lab at bedside

## 2021-07-29 NOTE — Assessment & Plan Note (Signed)
Potassium 3.3 ?20 mEq potassium given at admission ?Recheck with tomorrow's labs ?

## 2021-07-29 NOTE — Consult Note (Addendum)
? ?Cardiology Consultation:  ? ?Patient ID: Erin Herman ?MRN: [id number]; DOB: 06-24-1927 ? ?Admit date: 07/28/2021 ?Date of Consult: 07/29/2021 ? ?PCP:  Erin Blitz, MD ?  ?[facility] Providers ?Cardiologist:  Erin Lesches, MD      ? ? ?Patient Profile:  ? ?Erin Herman is a 88 y.o. 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 Clearence Herman. ? ?History of Present Illness:  ? ?Erin Herman was last examined by Dr. Domenic Herman in 05/2021 and reported having fatigue and decreased energy but denied any specific angina. Given her soft BP of 88/50, Losartan was discontinued and she was continued on Toprol-XL, Aldactone, ASA, Crestor and Lasix. ? ?She called the office yesterday reporting intermittent episodes of chest pain since Sunday and her family members ultimately called the after-hours line and it was recommended she go to the ED for further evaluation. In talking with the patient today, she reports having intermittent episodes of chest pressure since this past weekend and she has been utilizing Imdur 7.5mg  several times a day with temporary improvement in her symptoms but the pressure returns. She reports this typically occurs with activity such as moving around the house, improving with rest. No association with positional changes or food consumption. No specific orthopnea, PND or pitting edema. She has been under increased stress as her 40 year old husband suffered a  heart attack approximately 3 weeks ago and is now being followed by Hospice. She reports one of her daughters is staying with them a majority of the time and helping with routine chores around the house. ? ?Initial labs this admission show WBC 6.1, Hgb 10.4, platelets 216, Na+ 134, K+ 3.3 and creatinine 0.82. BNP 286. Initial and repeat Hs Troponin values have been flat at 29, 37 and 36. FLP shows total cholesterol of 139, triglycerides 93, HDL 40 and LDL 80. CXR shows no active cardiopulmonary disease. EKG shows NSR, HR 91 with 1st degree AV block and underlying LBBB.  ? ? ?Past Medical History:  ?Diagnosis Date  ? Anemia   ? iron  ? Anxiety   ? Aortic stenosis   ? Bioprosthetic AVR 2006  ? CAD (coronary artery disease), native coronary artery   ? Multivessel status post CABG 2006, occluded SVG to circumflex, DES to circumflex July 2020  ? Cardiomyopathy ([place])   ? Chronic kidney disease   ? Depression   ? Dysphagia   ? Essential hypertension   ? Family history of adverse reaction to anesthesia   ? N&V  ? GERD (gastroesophageal reflux disease)   ? Headache   ? History of hiatal hernia   ? HOH (hard of hearing)   ? LBBB (left bundle [name] block)   ? Mixed hyperlipidemia   ? NSTEMI (non-ST elevated myocardial infarction) ([place])   ? July 2020  ? Osteoarthritis   ? Postoperative nausea and vomiting   ? Morphine  ? Type 2 diabetes mellitus ([place])   ? UTI (lower urinary tract infection)   ? ? ?  Past Surgical History:  ?Procedure Laterality Date  ? ABDOMINAL HYSTERECTOMY    ? partial  ? AORTIC VALVE REPLACEMENT  2006  ? Erin Herman - 23 mm [place] stentless cadaver valve  ? CARDIAC CATHETERIZATION  2020  ? with stents  ? CHOLECYSTECTOMY    ? CORONARY ARTERY BYPASS GRAFT  2006   ? Erin Herman - LIMA to LAD, SVG to circumflex  ? CORONARY STENT INTERVENTION N/A 11/06/2018  ? Procedure: CORONARY STENT INTERVENTION;  Surgeon: Erin Bush, MD;  Location: [place] [facility];  Service: Cardiovascular;  Laterality: N/A;  ?  CORONARY/GRAFT ANGIOGRAPHY N/A 11/06/2018  ? Procedure: CORONARY/GRAFT ANGIOGRAPHY;  Surgeon: Erin Bush, MD;  Location: [place] [facility];  Service: Cardiovascular;  Laterality: N/A;  ? LEFT HEART CATH AND CORS/GRAFTS ANGIOGRAPHY N/A 11/07/2020  ? Procedure: LEFT HEART CATH AND CORS/GRAFTS ANGIOGRAPHY;  Surgeon: Erin Blanks, MD;  Location: [place] [facility];  Service: Cardiovascular;  Laterality: N/A;  ? THYROID LOBECTOMY Right   ? TOTAL HIP ARTHROPLASTY Right 03/01/2017  ? Procedure: RIGHT TOTAL HIP ARTHROPLASTY ANTERIOR APPROACH;  Surgeon: Erin Cancel, MD;  Location: [facility];  Service: Orthopedics;  Laterality: Right;  70 mins  ? TOTAL HIP ARTHROPLASTY Left 04/29/2020  ? Procedure: TOTAL HIP ARTHROPLASTY ANTERIOR APPROACH;  Surgeon: Erin Cancel, MD;  Location: [facility];  Service: Orthopedics;  Laterality: Left;  70 mins  ? TOTAL KNEE ARTHROPLASTY Right 2006  ? TOTAL KNEE ARTHROPLASTY Left 05/20/2014  ? DR Erin Herman  ? TOTAL KNEE ARTHROPLASTY Left 05/20/2014  ? Procedure: LEFT TOTAL KNEE ARTHROPLASTY;  Surgeon: Erin Junes, MD;  Location: [facility];  Service: Orthopedics;  Laterality: Left;  ?  ? ?Home Medications:  ?Prior to Admission medications   ?Medication Sig Start Date End Date Taking? Authorizing Provider  ?acetaminophen (TYLENOL) 325 MG tablet Take 2 tablets (650 mg total) by mouth every 6 (six) hours as needed. 06/18/21   Erin Eagles, PA-C  ?APPLE CIDER VINEGAR PO Take 1 tablet by mouth every morning.    [provider]  ?aspirin EC 81 MG tablet Take 1 tablet (81 mg total) by mouth daily. Swallow whole. 06/26/20   Erin Sark, MD  ?Biotin w/ Vitamins C & E (HAIR/SKIN/NAILS PO) Take 1 tablet by mouth daily.    [provider]  ?Cholecalciferol (VITAMIN D3) 125 MCG (5000 UT) CAPS Take 5,000 Units by mouth daily.    [provider]  ?CINNAMON PO Take 1 tablet by mouth 2 (two) times daily.    [provider]  ?Coenzyme Q10 (COQ10 PO) Take 1 tablet by mouth  daily.    [provider]  ?CRANBERRY PO Take 1 tablet by mouth daily.    [provider]  ?Cyanocobalamin (B-12) 2500 MCG TABS Take 2,500 mcg by mouth daily.    [provider]  ?famotidine (PEPCID) 20 MG tablet Take 10 mg by mouth 2 (two) times daily.    [provider]  ?furosemide (LASIX) 40 MG tablet Take 1 tablet (40 mg total) by mouth 2 (two) times daily. 12/17/19   Erin Sark, MD  ?HYDROcodone-acetaminophen (NORCO/VICODIN) 5-325 MG tablet Take 1 tablet by mouth every 6 (six) hours as needed for severe pain. 06/18/21   Erin Eagles, PA-C  ?isosorbide mononitrate (IMDUR) 30 MG 24 hr tablet Take 7.5 mg by mouth daily ?Patient not taking: Reported on 06/08/2021 01/06/21   Verta Ellen., NP  ?levothyroxine (SYNTHROID) 88 MCG tablet Take 88 mcg by mouth daily  before breakfast.    [provider]  ?metFORMIN (GLUCOPHAGE) 500 MG tablet Take 1 tablet (500 mg total) by mouth 2 (two) times daily with a meal. 11/10/18   Cristal Ford, DO  ?metoprolol succinate (TOPROL XL) 25 MG 24 hr tablet Take 1 tablet (25 mg total) by mouth daily. 01/07/21   Verta Ellen., NP  ?Multiple Vitamins-Minerals (HAIR/SKIN/NAILS/BIOTIN) TABS Take 1 tablet by mouth daily.    [provider]  ?Polyethyl Glycol-Propyl Glycol (SYSTANE OP) Place 1 drop into both eyes as needed.    [provider]  ?potassium chloride SA (K-DUR,KLOR-CON) 20 MEQ tablet Take 1 tablet (20 mEq total) by mouth daily after supper. 01/26/17   Erin Sark, MD  ?psyllium (METAMUCIL) 58.6 % packet Take 1 packet by mouth daily.    [provider]  ?rosuvastatin (CRESTOR) 5 MG tablet Take 1 tablet (5 mg total) by mouth daily. 01/06/21   Verta Ellen., NP  ?sertraline (ZOLOFT) 100 MG tablet Take 100 mg by mouth at bedtime.    [provider]  ?spironolactone (ALDACTONE) 25 MG tablet Take 0.5 tablets (12.5 mg total) by mouth daily. 07/06/16   Herminio Commons, MD   ?vitamin C (ASCORBIC ACID) 500 MG tablet Take 500 mg by mouth daily.    [provider]  ?Vitamin E 180 MG (400 UNIT) CAPS Take 400 Units by mouth daily.    [provider]  ? ? ?Inpatient Medications:

## 2021-07-29 NOTE — Telephone Encounter (Signed)
Currently being seen in ED ?

## 2021-07-29 NOTE — Assessment & Plan Note (Signed)
Continue aspirin, Imdur, increase metoprolol, continue Crestor, add Ranexa ?Continue to monitor ?

## 2021-07-29 NOTE — Assessment & Plan Note (Signed)
Continue Crestor ?Lipid panel in the a.m. ?

## 2021-07-29 NOTE — Progress Notes (Signed)
*  PRELIMINARY RESULTS* ?Echocardiogram ?Limited 2D Echocardiogram has been performed. ? ?Carolyne Fiscal ?07/29/2021, 12:09 PM ?

## 2021-07-29 NOTE — Assessment & Plan Note (Signed)
Continue Zoloft 

## 2021-07-29 NOTE — Assessment & Plan Note (Signed)
-   Continue metoprolol and spironolactone -Continue to monitor 

## 2021-07-29 NOTE — Assessment & Plan Note (Signed)
-  Troponin minimally elevated, continue to trend ?-Cath from July/22 shows multivessel CAD status post CABG with only LIMA to LAD is open has severe native disease which is not amenable to PCI ?-Cardiology note from same day's presentation recommends Ranexa 1 g twice daily and increasing beta-blocker ?-Monitor on telemetry ?-Cardiology consult ? ?

## 2021-07-29 NOTE — ED Notes (Signed)
Daughter called and received an update on pt.   ?

## 2021-07-30 DIAGNOSIS — I259 Chronic ischemic heart disease, unspecified: Secondary | ICD-10-CM | POA: Diagnosis not present

## 2021-07-30 DIAGNOSIS — I25110 Atherosclerotic heart disease of native coronary artery with unstable angina pectoris: Secondary | ICD-10-CM | POA: Diagnosis not present

## 2021-07-30 DIAGNOSIS — I10 Essential (primary) hypertension: Secondary | ICD-10-CM | POA: Diagnosis not present

## 2021-07-30 DIAGNOSIS — I5032 Chronic diastolic (congestive) heart failure: Secondary | ICD-10-CM | POA: Diagnosis not present

## 2021-07-30 DIAGNOSIS — E782 Mixed hyperlipidemia: Secondary | ICD-10-CM

## 2021-07-30 LAB — GLUCOSE, CAPILLARY
Glucose-Capillary: 127 mg/dL — ABNORMAL HIGH (ref 70–99)
Glucose-Capillary: 173 mg/dL — ABNORMAL HIGH (ref 70–99)

## 2021-07-30 LAB — BASIC METABOLIC PANEL
Anion gap: 10 (ref 5–15)
BUN: 31 mg/dL — ABNORMAL HIGH (ref 8–23)
CO2: 30 mmol/L (ref 22–32)
Calcium: 9.4 mg/dL (ref 8.9–10.3)
Chloride: 97 mmol/L — ABNORMAL LOW (ref 98–111)
Creatinine, Ser: 1.25 mg/dL — ABNORMAL HIGH (ref 0.44–1.00)
GFR, Estimated: 40 mL/min — ABNORMAL LOW (ref >60)
Glucose, Bld: 137 mg/dL — ABNORMAL HIGH (ref 70–99)
Potassium: 3.5 mmol/L (ref 3.5–5.1)
Sodium: 137 mmol/L (ref 135–145)

## 2021-07-30 LAB — MAGNESIUM: Magnesium: 2.2 mg/dL (ref 1.7–2.4)

## 2021-07-30 MED ORDER — FUROSEMIDE 20 MG PO TABS: 20.0000 mg | ORAL_TABLET | Freq: Every evening | ORAL | Status: DC

## 2021-07-30 MED ORDER — RANOLAZINE ER 500 MG PO TB12
500.0000 mg | ORAL_TABLET | Freq: Two times a day (BID) | ORAL | 1 refills | Status: DC
Start: 2021-07-30 — End: 2021-10-08

## 2021-07-30 MED ORDER — FUROSEMIDE 20 MG PO TABS: ORAL_TABLET | ORAL | 1 refills | Status: DC

## 2021-07-30 MED ORDER — FUROSEMIDE 40 MG PO TABS: 40.0000 mg | ORAL_TABLET | Freq: Every day | ORAL | Status: DC

## 2021-07-30 NOTE — Care Management Obs Status (Signed)
MEDICARE OBSERVATION STATUS NOTIFICATION ? ? ?Patient Details  ?Name: Erin Herman ?MRN: [id number] ?Date of Birth: 29-Jul-1927 ? ? ?Medicare Observation Status Notification Given:  Yes ? ? ? ?Corey Harold ?07/30/2021, 11:15 AM ?

## 2021-07-30 NOTE — Progress Notes (Signed)
? ?Progress Note ? ?Patient Name: Erin Herman ?Date of Encounter: 07/30/2021 ? ?[facility] Cardiologist: Rozann Lesches, MD  ? ?Subjective  ? ?Patient feels well. No chest pain.  ? ?Inpatient Medications  ?  ?Scheduled Meds: ? aspirin EC  81 mg Oral Daily  ? famotidine  10 mg Oral BID  ? heparin  5,000 Units Subcutaneous Q8H  ? insulin aspart  0-15 Units Subcutaneous TID WC  ? levothyroxine  88 mcg Oral QAC breakfast  ? metoprolol succinate  25 mg Oral Daily  ?  morphine injection  2 mg Intravenous Once  ? ranolazine  500 mg Oral BID  ? rosuvastatin  5 mg Oral Daily  ? sertraline  100 mg Oral QHS  ? ?Continuous Infusions: ? ?PRN Meds: ?acetaminophen, ondansetron (ZOFRAN) IV  ? ?Vital Signs  ?  ?Vitals:  ? 07/29/21 1603 07/29/21 1820 07/29/21 2234 07/30/21 0218  ?BP: 134/77 130/84 132/74 135/80  ?Pulse: 69 72 73 76  ?Resp: 16  20 18   ?Temp:   97.8 ?F (36.6 ?C) 98.1 ?F (36.7 ?C)  ?TempSrc:   Oral Oral  ?SpO2:   97% 96%  ?Weight:      ?Height:      ? ?No intake or output data in the 24 hours ending 07/30/21 1126 ? ?  07/28/2021  ?  9:30 PM 06/08/2021  ?  3:22 PM 01/06/2021  ?  1:56 PM  ?Last 3 Weights  ?Weight (lbs) 132 lb 135 lb 6.4 oz 133 lb  ?Weight (kg) 59.875 kg 61.417 kg 60.328 kg  ?   ? ?Telemetry  ?  ?NSR with PVC - Personally Reviewed ? ?ECG  ?  ?No new ECG - Personally Reviewed ? ?Physical Exam  ? ?GEN: Elderly female, NAD ?Neck: No JVD ?Cardiac: RRR, no murmurs, rubs, or gallops.  ?Respiratory: Faint crackles at left lung base; otherwise clear ?GI: Soft, nontender, non-distended  ?MS: No edema; No deformity. ?Neuro:  Nonfocal  ?Psych: Normal affect  ? ?Labs  ?  ?High Sensitivity Troponin:   ?Recent Labs  ?Lab 07/28/21 ?2153 07/29/21 ?[id number] 07/29/21 ?[id number]  ?TROPONINIHS 29* 37* 36*  ?   ?Chemistry ?Recent Labs  ?Lab 07/28/21 ?2153 07/30/21 ?[id number]  ?NA 134* 137  ?K 3.3* 3.5  ?CL 97* 97*  ?CO2 26 30  ?GLUCOSE 110* 137*  ?BUN 31* 31*  ?CREATININE 0.82 1.25*  ?CALCIUM 9.0 9.4  ?MG  --  2.2  ?GFRNONAA >60 40*   ?ANIONGAP 11 10  ?  ?Lipids  ?Recent Labs  ?Lab 07/29/21 ?0014  ?CHOL 139  ?TRIG 93  ?HDL 40*  ?[place] 80  ?CHOLHDL 3.5  ?  ?Hematology ?Recent Labs  ?Lab 07/28/21 ?2153  ?WBC 6.1  ?RBC 3.28*  ?HGB 10.4*  ?HCT 31.1*  ?MCV 94.8  ?MCH 31.7  ?MCHC 33.4  ?RDW 11.9  ?PLT 216  ? ?Thyroid No results for input(s): TSH, FREET4 in the last 168 hours.  ?BNP ?Recent Labs  ?Lab 07/28/21 ?2153  ?BNP 286.0*  ?  ?DDimer No results for input(s): DDIMER in the last 168 hours.  ? ?Radiology  ?  ?DG Chest 2 View ? ?Result Date: 07/28/2021 ?CLINICAL DATA:  Chest pain and pressure. EXAM: CHEST - 2 VIEW COMPARISON:  Portable chest 12/29/2020. FINDINGS: There is mild cardiomegaly, old CABG, aortic calcification and tortuosity with stable mediastinum. No vascular congestion is seen. The lungs are slightly emphysematous but clear. The sulci are sharp. Osteopenia and thoracic spondylosis. IMPRESSION: No active cardiopulmonary disease. Stable post CABG  chest with mild cardiomegaly. Aortic atherosclerosis. COPD. Electronically Signed   By: Telford Nab M.D.   On: 07/28/2021 22:22  ? ?ECHOCARDIOGRAM LIMITED ? ?Result Date: 07/29/2021 ?   ECHOCARDIOGRAM LIMITED REPORT   Patient Name:   TEHILA COSTANZO Date of Exam: 07/29/2021 Medical Rec #:  [id number]           Height:       64.0 in Accession #:    [id number]          Weight:       132.0 lb Date of Birth:  1927/06/22           BSA:          1.640 m? Patient Age:    88 years            BP:           111/64 mmHg Patient Gender: F                   HR:           73 bpm. Exam Location:  [facility] Procedure: Limited Echo Indications:     Chest Pain  History:         Patient has prior history of Echocardiogram examinations, most                  recent 11/07/2020. Cardiomyopathy and CHF, CAD and Previous                  Myocardial Infarction, Arrythmias:LBBB, Signs/Symptoms:Chest                  Pain; Risk Factors:Hypertension, Dyslipidemia and Diabetes.                  There is a 23 mm [place]  bioprosthetic valve in the Aortic                  position.                  Images by Lonn Georgia, student.  Sonographer:     Wenda Low Referring Phys:  [id number] Erma Heritage Diagnosing Phys: Carlyle Dolly MD IMPRESSIONS  1. Left ventricular ejection fraction, by estimation, is 35 to 40%. The left ventricle has moderately decreased function.  2. Right ventricular systolic function is normal. The right ventricular size is normal.  3. The inferior vena cava is normal in size with greater than 50% respiratory variability, suggesting right atrial pressure of 3 mmHg.  4. Limited echo to evaluate LV function FINDINGS  Left Ventricle: Anterolateral hypokinesis. Anteroseptal motion consistent with LBBB. Left ventricular ejection fraction, by estimation, is 35 to 40%. The left ventricle has moderately decreased function. Right Ventricle: The right ventricular size is normal. Right vetricular wall thickness was not well visualized. Right ventricular systolic function is normal. Pericardium: There is no evidence of pericardial effusion. Venous: The inferior vena cava is normal in size with greater than 50% respiratory variability, suggesting right atrial pressure of 3 mmHg. LEFT VENTRICLE PLAX 2D LVIDd:         4.40 cm LVIDs:         3.50 cm LV PW:         1.60 cm LV IVS:        1.10 cm LVOT diam:     1.30 cm LVOT Area:     1.33 cm?  LV Volumes (MOD) LV vol d, MOD A2C: 76.4 ml LV vol  d, MOD A4C: 69.3 ml LV vol s, MOD A2C: 40.3 ml LV vol s, MOD A4C: 39.5 ml LV SV MOD A2C:     36.1 ml LV SV MOD A4C:     69.3 ml LV SV MOD BP:      33.6 ml LEFT ATRIUM             Index LA diam:        4.40 cm 2.68 cm/m? LA Vol (A2C):   35.4 ml 21.59 ml/m? LA Vol (A4C):   33.3 ml 20.31 ml/m? LA Biplane Vol: 35.5 ml 21.65 ml/m?   AORTA Ao Root diam: 1.60 cm Ao Asc diam:  2.40 cm  SHUNTS Systemic Diam: 1.30 cm Carlyle Dolly MD Electronically signed by Carlyle Dolly MD Signature Date/Time: 07/29/2021/12:37:15 PM    Final (Updated)     ? ?Cardiac Studies  ? ?LHC: 10/2020 ?Mid LM lesion is 30% stenosed. ?  Ost LAD lesion is 25% stenosed. ?  Prox Cx to Mid Cx lesion is 50% stenosed. ?  Mid RCA lesion is 100% stenosed. ?  Origin to Prox Graft lesion is 100% stenosed. ?  LPAV lesion is 40% stenosed. ?  1st Mrg lesion is 95% stenosed. ?  Prox LAD to Mid LAD lesion is 99% stenosed. ?  Previously placed Ost Cx to Prox Cx stent (unknown type) is  widely patent. ?  SVG graft was not injected. ?  LIMA and is normal in caliber. ?  The graft exhibits no disease. ?  ?Severe triple vessel CAD s/p CABG with 1/2 patent bypass grafts ?Chronic sub-total occlusion of the mid LAD. The mid and distal LAD fills from the patent LIMA graft ?Patent proximal Circumflex stent. Beyond the stent there is a moderate stenosis in the AV groove Circumflex stent. The moderate caliber obtuse marginal branch has a long segment of severe stenosis extending from the ostium into the mid vessel. This has been present since her last cath. The vessel had been bypassed. The vein graft to the obtuse marginal branch is known to be occluded. The obtuse marginal branch is not a favorable target for PCI.  ?The RCA is a small non-dominant vessel. The vessel is chronically occluded in the mid segment. The distal vessel fills from right to right collaterals.  ?  ?Recommendations: No good options for PCI. I would not approach the OM branch with PCI as this would likely compromise flow into the AV groove Circumflex which supplies a dominant lateral wall system. Continue medical management of CAD.  ?  ?  ?Echocardiogram: 10/2020 ?IMPRESSIONS  ? ? ? 1. Pronounced septal motion consistent with LBBB and postop. Left  ?ventricular ejection fraction, by estimation, is 40 to 45%. The left  ?ventricle has mildly decreased function. The left ventricle has no  ?regional wall motion abnormalities. Left  ?ventricular diastolic parameters are consistent with Grade II diastolic  ?dysfunction  (pseudonormalization).  ? 2. Right ventricular systolic function is mildly reduced. The right  ?ventricular size is moderately enlarged. There is normal pulmonary artery  ?systolic pressure. The estimated right ventricular systo

## 2021-07-30 NOTE — Progress Notes (Signed)
Discharge instructions given. Patient verbalized understanding with no further questions. Awaiting daughter to come pick up patient. IV removed. Call light within reach.  ?

## 2021-07-30 NOTE — Discharge Summary (Signed)
Physician Discharge Summary  ?Erin Herman [id number] DOB: March 06, 1928 DOA: 07/28/2021 ? ?PCP: Monico Blitz, MD ? ?Admit date: 07/28/2021 ? ?Discharge date: 07/30/2021 ? ?Admitted From:Home ? ?Disposition:  Home ? ?Recommendations for Outpatient Follow-up:  ?Follow up with PCP in 1-2 weeks ?Follow-up with cardiology outpatient in 1-2 weeks ?Continue Ranexa 500 mg twice daily as prescribed ?Continue other home blood pressure agents as noted below with Lasix adjusted to 40 mg in a.m. and 20 mg p.m. ? ?Home Health: None ? ?Equipment/Devices: None ? ?Discharge Condition:Stable ? ?CODE STATUS: DNI ? ?Diet recommendation: Heart Healthy/Carb Mod ? ?Brief/Interim Summary: ?Per HPI: ?Erin Herman is a 88 y.o. female with medical history significant of with history of anemia, coronary artery disease status post CABG, cardiomyopathy, chronic kidney disease, essential hypertension, GERD, mixed hyperlipidemia, type 2 diabetes, and more presents the ED with a chief complaint of chest pain.  Patient describes the pain as a substernal pressure.  It started midmorning while she was doing house chores.  She does not think her house chores were particularly strenuous or like she was exerting herself.  The pressure was constant until it went away in the ER.  She is not sure what made it go away in the ER.  She denies any associated shortness of breath, nausea, diaphoresis, dizziness.  She does admit to palpitations.  She reports that the pain did not radiate at all.  She has had a heart attack before and she reports that this was not nearly as bad as it was when she had a heart attack.  Patient has been under a lot of stress at home with her husband in hospice, and her daughter living with them trying to help take care of them.  She lastly complains about feeling like her abdomen has fluid on it.  She reports when she has fluid retention its in her face and her abdomen never in her legs.  Patient has no other complaints at  this time. ?Patient does not smoke, does not drink, does not use illicit drugs.  She is vaccinated for COVID.  Patient is DNI. ? ?-Patient was admitted for chest pain concerning for anginal symptoms in the setting of multivessel CAD.  Unfortunately, given advanced age and lack of ACS with poor PCI targets she was recommended conservative management.  She was started on Ranexa 500 mg twice daily with no further chest pain noted.  She is able to ambulate with no exertional chest pain as well.  She has been recommended to continue her medications as noted below with aspirin, Crestor, Toprol-XL, and Imdur as needed.  Lasix dosing has been adjusted as noted above. ? ?Discharge Diagnoses:  ?Principal Problem: ?  Chest pain ?Active Problems: ?  Hyperlipidemia ?  Essential hypertension, benign ?  CAD, NATIVE VESSEL ?  Chronic diastolic heart failure ([place]) ?  GERD (gastroesophageal reflux disease) ?  Depression ?  Hypokalemia ? ?Principal discharge diagnosis: Chest pain concerning for angina in the setting of multivessel CAD. ? ?Discharge Instructions ? ?Discharge Instructions   ? ? Diet - low sodium heart healthy   Complete by: As directed ?  ? Increase activity slowly   Complete by: As directed ?  ? ?  ? ?Allergies as of 07/30/2021   ? ?   Reactions  ? Lipitor [atorvastatin]   ? MUSCLE ACHES/FATIGUE  ? Morphine Nausea And Vomiting  ? Penicillins Swelling, Other (See Comments)  ? Tolerated Cephalosporin Date: 04/29/20. ?Severe arm swelling and redness ?Did it involve swelling  of the face/tongue/throat, SOB, or low BP? No ?Did it involve sudden or severe rash/hives, skin peeling, or any reaction on the inside of your mouth or nose? No ?Did you need to seek medical attention at a hospital or doctor's office? No ?When did it last happen?      30 years ?If all above answers are "NO", may proceed with cephalosporin use.  ? Reclast [zoledronic Acid]   ? "nausea and vomiting. Could not get out of bed.  ? ?  ? ?  ?Medication List  ?   ? ?STOP taking these medications   ? ?HYDROcodone-acetaminophen 5-325 MG tablet ?Commonly known as: NORCO/VICODIN ?  ? ?  ? ?TAKE these medications   ? ?acetaminophen 325 MG tablet ?Commonly known as: Tylenol ?Take 2 tablets (650 mg total) by mouth every 6 (six) hours as needed. ?What changed: reasons to take this ?  ?APPLE CIDER VINEGAR PO ?Take 1 tablet by mouth every morning. ?  ?aspirin EC 81 MG tablet ?Take 1 tablet (81 mg total) by mouth daily. Swallow whole. ?  ?B-12 2500 MCG Tabs ?Take 2,500 mcg by mouth daily. ?  ?CINNAMON PO ?Take 1 tablet by mouth 2 (two) times daily. ?  ?COQ10 PO ?Take 1 tablet by mouth daily. ?  ?CRANBERRY PO ?Take 1 tablet by mouth daily. ?  ?famotidine 20 MG tablet ?Commonly known as: PEPCID ?Take 10 mg by mouth 2 (two) times daily. ?  ?furosemide 20 MG tablet ?Commonly known as: LASIX ?Take 40mg  in am and 20mg  pm. ?What changed:  ?medication strength ?how much to take ?how to take this ?when to take this ?additional instructions ?  ?Hair/Skin/Nails/Biotin Tabs ?Take 1 tablet by mouth daily. ?  ?isosorbide mononitrate 30 MG 24 hr tablet ?Commonly known as: IMDUR ?Take 7.5 mg by mouth daily ?What changed:  ?how much to take ?how to take this ?when to take this ?reasons to take this ?additional instructions ?  ?levothyroxine 88 MCG tablet ?Commonly known as: SYNTHROID ?Take 88 mcg by mouth daily before breakfast. ?  ?metFORMIN 500 MG tablet ?Commonly known as: GLUCOPHAGE ?Take 1 tablet (500 mg total) by mouth 2 (two) times daily with a meal. ?  ?metoprolol succinate 25 MG 24 hr tablet ?Commonly known as: Toprol XL ?Take 1 tablet (25 mg total) by mouth daily. ?  ?potassium chloride SA 20 MEQ tablet ?Commonly known as: KLOR-CON M ?Take 1 tablet (20 mEq total) by mouth daily after supper. ?  ?psyllium 58.6 % packet ?Commonly known as: METAMUCIL ?Take 1 packet by mouth daily. ?  ?ranolazine 500 MG 12 hr tablet ?Commonly known as: RANEXA ?Take 1 tablet (500 mg total) by mouth 2 (two) times  daily. ?  ?rosuvastatin 5 MG tablet ?Commonly known as: CRESTOR ?Take 1 tablet (5 mg total) by mouth daily. ?  ?sertraline 100 MG tablet ?Commonly known as: ZOLOFT ?Take 100 mg by mouth at bedtime. ?  ?spironolactone 25 MG tablet ?Commonly known as: ALDACTONE ?Take 0.5 tablets (12.5 mg total) by mouth daily. ?  ?SYSTANE OP ?Place 1 drop into both eyes as needed (dry eye). ?  ?vitamin C 500 MG tablet ?Commonly known as: ASCORBIC ACID ?Take 500 mg by mouth daily. ?  ?Vitamin D3 125 MCG (5000 UT) Caps ?Take 5,000 Units by mouth daily. ?  ?Vitamin E 180 MG (400 UNIT) Caps ?Take 400 Units by mouth daily. ?  ? ?  ? ? Follow-up Information   ? ? Monico Blitz, MD. Schedule an appointment  as soon as possible for a visit in 1 week(s).   ?Specialty: Internal Medicine ?Contact information: ?[address]  ?[place] [address] ?[phone] ? ? ?  ?  ? ? Satira Sark, MD. Schedule an appointment as soon as possible for a visit in 1 week(s).   ?Specialty: Cardiology ?Contact information: ?[address] ?[place] [address] ?[phone] ? ? ?  ?  ? ?  ?  ? ?  ? ?Allergies  ?Allergen Reactions  ? Lipitor [Atorvastatin]   ?  MUSCLE ACHES/FATIGUE  ? Morphine Nausea And Vomiting  ? Penicillins Swelling and Other (See Comments)  ?  Tolerated Cephalosporin Date: 04/29/20. ? ?Severe arm swelling and redness ?Did it involve swelling of the face/tongue/throat, SOB, or low BP? No ?Did it involve sudden or severe rash/hives, skin peeling, or any reaction on the inside of your mouth or nose? No ?Did you need to seek medical attention at a hospital or doctor's office? No ?When did it last happen?      30 years ?If all above answers are "NO", may proceed with cephalosporin use.  ? Reclast [Zoledronic Acid]   ?  "nausea and vomiting. Could not get out of bed.  ? ? ?Consultations: ?Cardiology ? ? ?Procedures/Studies: ?DG Chest 2 View ? ?Result Date: 07/28/2021 ?CLINICAL DATA:  Chest pain and pressure. EXAM: CHEST - 2 VIEW COMPARISON:   Portable chest 12/29/2020. FINDINGS: There is mild cardiomegaly, old CABG, aortic calcification and tortuosity with stable mediastinum. No vascular congestion is seen. The lungs are slightly emphysematous bu

## 2021-08-09 ENCOUNTER — Emergency Department (HOSPITAL_COMMUNITY)
Admission: EM | Admit: 2021-08-09 | Discharge: 2021-08-09 | Disposition: A | Discharge status: Home / Self Care | Payer: Medicare Other | Attending: Emergency Medicine | Admitting: Emergency Medicine

## 2021-08-09 ENCOUNTER — Emergency Department (HOSPITAL_COMMUNITY): Payer: Medicare Other

## 2021-08-09 ENCOUNTER — Encounter (HOSPITAL_COMMUNITY): Payer: Self-pay

## 2021-08-09 ENCOUNTER — Other Ambulatory Visit: Payer: Self-pay

## 2021-08-09 DIAGNOSIS — J189 Pneumonia, unspecified organism: Secondary | ICD-10-CM | POA: Diagnosis not present

## 2021-08-09 DIAGNOSIS — R791 Abnormal coagulation profile: Secondary | ICD-10-CM | POA: Diagnosis not present

## 2021-08-09 DIAGNOSIS — R778 Other specified abnormalities of plasma proteins: Secondary | ICD-10-CM | POA: Insufficient documentation

## 2021-08-09 DIAGNOSIS — R079 Chest pain, unspecified: Secondary | ICD-10-CM | POA: Diagnosis present

## 2021-08-09 LAB — BASIC METABOLIC PANEL
Anion gap: 13 (ref 5–15)
BUN: 47 mg/dL — ABNORMAL HIGH (ref 8–23)
CO2: 27 mmol/L (ref 22–32)
Calcium: 9.7 mg/dL (ref 8.9–10.3)
Chloride: 90 mmol/L — ABNORMAL LOW (ref 98–111)
Creatinine, Ser: 1.28 mg/dL — ABNORMAL HIGH (ref 0.44–1.00)
GFR, Estimated: 39 mL/min — ABNORMAL LOW (ref >60)
Glucose, Bld: 190 mg/dL — ABNORMAL HIGH (ref 70–99)
Potassium: 3 mmol/L — ABNORMAL LOW (ref 3.5–5.1)
Sodium: 130 mmol/L — ABNORMAL LOW (ref 135–145)

## 2021-08-09 LAB — CBC
HCT: 33 % — ABNORMAL LOW (ref 36.0–46.0)
Hemoglobin: 11.1 g/dL — ABNORMAL LOW (ref 12.0–15.0)
MCH: 31.6 pg (ref 26.0–34.0)
MCHC: 33.6 g/dL (ref 30.0–36.0)
MCV: 94 fL (ref 80.0–100.0)
Platelets: 255 10*3/uL (ref 150–400)
RBC: 3.51 MIL/uL — ABNORMAL LOW (ref 3.87–5.11)
RDW: 12.4 % (ref 11.5–15.5)
WBC: 11.5 10*3/uL — ABNORMAL HIGH (ref 4.0–10.5)
nRBC: 0 % (ref 0.0–0.2)

## 2021-08-09 LAB — D-DIMER, QUANTITATIVE: D-Dimer, Quant: 1.53 ug/mL-FEU — ABNORMAL HIGH (ref 0.00–0.50)

## 2021-08-09 LAB — TROPONIN I (HIGH SENSITIVITY)
Troponin I (High Sensitivity): 34 ng/L — ABNORMAL HIGH (ref <18)
Troponin I (High Sensitivity): 32 ng/L — ABNORMAL HIGH (ref <18)

## 2021-08-09 LAB — BRAIN NATRIURETIC PEPTIDE: B Natriuretic Peptide: 581 pg/mL — ABNORMAL HIGH (ref 0.0–100.0)

## 2021-08-09 MED ORDER — POTASSIUM CHLORIDE CRYS ER 20 MEQ PO TBCR
40.0000 meq | EXTENDED_RELEASE_TABLET | Freq: Once | ORAL | Status: AC
  Administered 2021-08-09: 40 meq via ORAL
  Filled 2021-08-09: qty 2

## 2021-08-09 MED ORDER — DOXYCYCLINE HYCLATE 100 MG PO TABS
100.0000 mg | ORAL_TABLET | Freq: Once | ORAL | Status: AC
  Administered 2021-08-09: 100 mg via ORAL
  Filled 2021-08-09: qty 1

## 2021-08-09 MED ORDER — DOXYCYCLINE HYCLATE 100 MG PO CAPS
100.0000 mg | ORAL_CAPSULE | Freq: Two times a day (BID) | ORAL | 0 refills | Status: DC
Start: 2021-08-09 — End: 2021-09-02

## 2021-08-09 MED ORDER — POTASSIUM CHLORIDE 10 MEQ/100ML IV SOLN
10.0000 meq | Freq: Once | INTRAVENOUS | Status: AC
  Administered 2021-08-09: 10 meq via INTRAVENOUS
  Filled 2021-08-09: qty 100

## 2021-08-09 MED ORDER — IOHEXOL 350 MG/ML SOLN
60.0000 mL | Freq: Once | INTRAVENOUS | Status: AC | PRN
  Administered 2021-08-09: 60 mL via INTRAVENOUS

## 2021-08-09 MED ORDER — FENTANYL CITRATE PF 50 MCG/ML IJ SOSY
50.0000 ug | PREFILLED_SYRINGE | Freq: Once | INTRAMUSCULAR | Status: AC
  Administered 2021-08-09: 50 ug via INTRAVENOUS
  Filled 2021-08-09: qty 1

## 2021-08-09 MED ORDER — FENTANYL CITRATE PF 50 MCG/ML IJ SOSY
25.0000 ug | PREFILLED_SYRINGE | Freq: Once | INTRAMUSCULAR | Status: AC
  Administered 2021-08-09: 25 ug via INTRAVENOUS
  Filled 2021-08-09: qty 1

## 2021-08-09 NOTE — Discharge Instructions (Addendum)
You have been seen and discharged from the emergency department.  Take antibiotic as directed.  You were given your first dose here in the department, continue dosage starting tonight 08/09/2021.  Take Tylenol as needed for pain control.  Stay well-hydrated.  Follow-up with your primary provider for further evaluation and further care. Take home medications as prescribed. If you have any worsening symptoms or further concerns for your health please return to an emergency department for further evaluation. ?

## 2021-08-09 NOTE — ED Notes (Signed)
Pt to CT

## 2021-08-09 NOTE — ED Provider Notes (Signed)
Patient signed out to me by previous provider. Please refer to their note for full HPI.  Briefly this is a 88 year old female who presented with chest pain.  EKG is unchanged, troponins are slightly elevated but flat at baseline.  Pain resolved with pain medicine.  Chest x-ray identifies pneumonia.  Patient had slight hypoxia and a D-dimer was elevated.  We are pending CT PE study. ?Physical Exam  ?BP 124/66   Pulse 75   Temp 98.5 ?F (36.9 ?C) (Oral)   Resp 19   Ht 5\' 4"  (1.626 m)   Wt 59.9 kg   LMP  (LMP Unknown)   SpO2 94%   BMI 22.66 kg/m?  ? ?Physical Exam ?Vitals and nursing note reviewed.  ?Constitutional:   ?   Appearance: Normal appearance.  ?HENT:  ?   Head: Normocephalic.  ?   Mouth/Throat:  ?   Mouth: Mucous membranes are moist.  ?Cardiovascular:  ?   Rate and Rhythm: Normal rate.  ?Pulmonary:  ?   Effort: Pulmonary effort is normal. No respiratory distress.  ?   Breath sounds: Examination of the right-lower field reveals decreased breath sounds and rales. Examination of the left-lower field reveals decreased breath sounds and rales. Decreased breath sounds and rales present.  ?Abdominal:  ?   Palpations: Abdomen is soft.  ?   Tenderness: There is no abdominal tenderness.  ?Skin: ?   General: Skin is warm.  ?Neurological:  ?   Mental Status: She is alert and oriented to person, place, and time. Mental status is at baseline.  ?Psychiatric:     ?   Mood and Affect: Mood normal.  ? ? ?Procedures  ?Procedures ? ?ED Course / MDM  ?  ?Medical Decision Making ?Amount and/or Complexity of Data Reviewed ?Labs: ordered. ?Radiology: ordered. ? ?Risk ?Prescription drug management. ? ? ?CT PE study is negative for blood clot.  Shows layering pleural effusions bilaterally with opacity in the right lower lobe.  Patient will be treated for pneumonia.  Repeat EKG shows no EKG changes.  Chest pain is currently controlled.  No hypoxia on room air, speaking in full sentences, no distress,  stable appearing.  Patient  at this time appears safe and stable for discharge and close outpatient follow up. Discharge plan and strict return to ED precautions discussed, patient verbalizes understanding and agreement. ? ? ? ? ?  ? , DO ?08/09/21 1041 ? ?

## 2021-08-09 NOTE — ED Triage Notes (Signed)
Pt from home via [facility] with c/o chest pain that started about an hour ago, centrally located with no radiation, no other associated symptoms reported. Pt took 4 baby asa prior to EMS arrival. Pt says she cannot take nitro because it drops her BP.  ?

## 2021-09-01 NOTE — Progress Notes (Signed)
? ? ?Cardiology Office Note ? ?Date: 09/02/2021  ? ?ID: Erin Herman, DOB 12-16-1927, MRN [id number] ? ?PCP:  Kirstie Peri, MD  ?Cardiologist:  Nona Dell, MD ?Electrophysiologist:  None  ? ?Chief Complaint  ?Patient presents with  ? Hospitalization Follow-up  ? ? ?History of Present Illness: ?Erin Herman is a 88 y.o. female presenting for a posthospital visit.  She was last seen in the office in February.  She was recently admitted to [facility] in April with chest discomfort, high-sensitivity troponin I levels not suggestive of ACS.  She was treated medically at that time.  States that she was ultimately diagnosed with pneumonia and treated with antibiotics as an outpatient. ? ?She is here today with her daughter for a follow-up visit, does not describe any recurring chest pain or nitroglycerin use with regularity.  She has been on stable cardiac regimen as noted below.  Blood pressure is running low, although she is not symptomatic.  Denies any orthostatic symptoms, no falls or syncope. ? ?Follow-up echocardiogram in April revealed LVEF 35 to 40% range.  Cardiomyopathy is being managed conservatively at this point. ? ?Past Medical History:  ?Diagnosis Date  ? Anemia   ? iron  ? Anxiety   ? Aortic stenosis   ? Bioprosthetic AVR 2006  ? CAD (coronary artery disease), native coronary artery   ? Multivessel status post CABG 2006, occluded SVG to circumflex, DES to circumflex July 2020  ? Cardiomyopathy (HCC)   ? Chronic kidney disease   ? Depression   ? Dysphagia   ? Essential hypertension   ? Family history of adverse reaction to anesthesia   ? N&V  ? GERD (gastroesophageal reflux disease)   ? Headache   ? History of hiatal hernia   ? HOH (hard of hearing)   ? LBBB (left bundle branch block)   ? Mixed hyperlipidemia   ? NSTEMI (non-ST elevated myocardial infarction) (HCC)   ? July 2020  ? Osteoarthritis   ? Postoperative nausea and vomiting   ? Morphine  ? Type 2 diabetes mellitus (HCC)   ? UTI  (lower urinary tract infection)   ? ? ?Past Surgical History:  ?Procedure Laterality Date  ? ABDOMINAL HYSTERECTOMY    ? partial  ? AORTIC VALVE REPLACEMENT  2006  ? Dr. Cornelius Moras - 23 mm [place] stentless cadaver valve  ? CARDIAC CATHETERIZATION  2020  ? with stents  ? CHOLECYSTECTOMY    ? CORONARY ARTERY BYPASS GRAFT  2006   ? Dr. Cornelius Moras - LIMA to LAD, SVG to circumflex  ? CORONARY STENT INTERVENTION N/A 11/06/2018  ? Procedure: CORONARY STENT INTERVENTION;  Surgeon: Yvonne Kendall, MD;  [facility];  Service: Cardiovascular;  Laterality: N/A;  ? CORONARY/GRAFT ANGIOGRAPHY N/A 11/06/2018  ? Procedure: CORONARY/GRAFT ANGIOGRAPHY;  Surgeon: Yvonne Kendall, MD;  Location: [facility];  Service: Cardiovascular;  Laterality: N/A;  ? LEFT HEART CATH AND CORS/GRAFTS ANGIOGRAPHY N/A 11/07/2020  ? Procedure: LEFT HEART CATH AND CORS/GRAFTS ANGIOGRAPHY;  Surgeon: Kathleene Hazel, MD;  Location: [facility];  Service: Cardiovascular;  Laterality: N/A;  ? THYROID LOBECTOMY Right   ? TOTAL HIP ARTHROPLASTY Right 03/01/2017  ? Procedure: RIGHT TOTAL HIP ARTHROPLASTY ANTERIOR APPROACH;  Surgeon: Durene Romans, MD;  Location: [facility];  Service: Orthopedics;  Laterality: Right;  70 mins  ? TOTAL HIP ARTHROPLASTY Left 04/29/2020  ? Procedure: TOTAL HIP ARTHROPLASTY ANTERIOR APPROACH;  Surgeon: Durene Romans, MD;  Location: [facility];  Service: Orthopedics;  Laterality: Left;  70 mins  ? TOTAL KNEE ARTHROPLASTY Right 2006  ? TOTAL KNEE ARTHROPLASTY Left 05/20/2014  ? DR Thurston Hole  ? TOTAL KNEE ARTHROPLASTY Left 05/20/2014  ? Procedure: LEFT TOTAL KNEE ARTHROPLASTY;  Surgeon: Nilda Simmer, MD;  Location: [facility];  Service: Orthopedics;  Laterality: Left;  ? ? ?Current Outpatient Medications  ?Medication Sig Dispense Refill  ? acetaminophen (TYLENOL) 325 MG tablet Take 2 tablets (650 mg total) by mouth every 6 (six) hours as needed. (Patient taking differently: Take 650 mg by mouth every 6 (six) hours as needed  for mild pain.) 30 tablet 0  ? APPLE CIDER VINEGAR PO Take 1 tablet by mouth every morning.    ? aspirin EC 81 MG tablet Take 1 tablet (81 mg total) by mouth daily. Swallow whole. 90 tablet 3  ? Cholecalciferol (VITAMIN D3) 125 MCG (5000 UT) CAPS Take 5,000 Units by mouth daily.    ? CINNAMON PO Take 1 tablet by mouth 2 (two) times daily.    ? ciprofloxacin (CIPRO) 500 MG tablet Take 500 mg by mouth 2 (two) times daily.    ? Coenzyme Q10 (COQ10 PO) Take 1 tablet by mouth daily.    ? CRANBERRY PO Take 1 tablet by mouth daily.    ? Cyanocobalamin (B-12) 2500 MCG TABS Take 2,500 mcg by mouth daily.    ? famotidine (PEPCID) 20 MG tablet Take 10 mg by mouth 2 (two) times daily.    ? furosemide (LASIX) 20 MG tablet Take 40mg  in am and 20mg  pm. 90 tablet 1  ? isosorbide mononitrate (IMDUR) 30 MG 24 hr tablet Take 7.5 mg by mouth daily 15 tablet 2  ? levothyroxine (SYNTHROID) 88 MCG tablet Take 88 mcg by mouth daily before breakfast.    ? metFORMIN (GLUCOPHAGE) 500 MG tablet Take 1 tablet (500 mg total) by mouth 2 (two) times daily with a meal.    ? metoprolol succinate (TOPROL XL) 25 MG 24 hr tablet Take 1 tablet (25 mg total) by mouth daily. 90 tablet 3  ? Multiple Vitamins-Minerals (HAIR/SKIN/NAILS/BIOTIN) TABS Take 1 tablet by mouth daily.    ? Polyethyl Glycol-Propyl Glycol (SYSTANE OP) Place 1 drop into both eyes as needed (dry eye).    ? potassium chloride SA (K-DUR,KLOR-CON) 20 MEQ tablet Take 1 tablet (20 mEq total) by mouth daily after supper. 90 tablet 0  ? psyllium (METAMUCIL) 58.6 % packet Take 1 packet by mouth daily.    ? ranolazine (RANEXA) 500 MG 12 hr tablet Take 1 tablet (500 mg total) by mouth 2 (two) times daily. (Patient taking differently: Take 500 mg by mouth daily.) 60 tablet 1  ? rosuvastatin (CRESTOR) 5 MG tablet Take 1 tablet (5 mg total) by mouth daily. 90 tablet 2  ? sertraline (ZOLOFT) 100 MG tablet Take 100 mg by mouth at bedtime.    ? spironolactone (ALDACTONE) 25 MG tablet Take 0.5 tablets  (12.5 mg total) by mouth daily. 45 tablet 3  ? vitamin C (ASCORBIC ACID) 500 MG tablet Take 500 mg by mouth daily.    ? Vitamin E 180 MG (400 UNIT) CAPS Take 400 Units by mouth daily.    ? ?No current facility-administered medications for this visit.  ? ?Allergies:  Lipitor [atorvastatin], Morphine, Nitroglycerin, Penicillins, and Reclast [zoledronic acid]  ? ?ROS: Hearing loss. ? ?Physical Exam: ?VS:  BP (!) 90/48   Pulse 84   Ht 5\' 4"  (1.626 m)   Wt 133 lb 6.4 oz (60.5 kg)  LMP  (LMP Unknown)   SpO2 97%   BMI 22.90 kg/m? , BMI Body mass index is 22.9 kg/m?. ? ?Wt Readings from Last 3 Encounters:  ?09/02/21 133 lb 6.4 oz (60.5 kg)  ?08/09/21 132 lb (59.9 kg)  ?07/28/21 132 lb (59.9 kg)  ?  ?General: Patient appears comfortable at rest.  Using a rolling walker. ?HEENT: Conjunctiva and lids normal. ?Neck: Supple, no elevated JVP or carotid bruits, no thyromegaly. ?Lungs: Clear to auscultation, nonlabored breathing at rest. ?Cardiac: Regular rate and rhythm, no S3, 1/6 systolic murmur. ?Extremities: No pitting edema. ? ?ECG:  An ECG dated 08/09/2021 was personally reviewed today and demonstrated:  Sinus rhythm with left bundle branch block and prolonged PR interval.  PACs. ? ?Recent Labwork: ?12/29/2020: ALT 13; AST 25 ?07/30/2021: Magnesium 2.2 ?08/09/2021: B Natriuretic Peptide 581.0; BUN 47; Creatinine, Ser 1.28; Hemoglobin 11.1; Platelets 255; Potassium 3.0; Sodium 130  ?   ?Component Value Date/Time  ? CHOL 139 07/29/2021 0014  ? TRIG 93 07/29/2021 0014  ? HDL 40 (L) 07/29/2021 0014  ? CHOLHDL 3.5 07/29/2021 0014  ? VLDL 19 07/29/2021 0014  ? LDLCALC 80 07/29/2021 0014  ? ? ?Other Studies Reviewed Today: ? ?Echocardiogram 07/29/2021: ? 1. Left ventricular ejection fraction, by estimation, is 35 to 40%. The  ?left ventricle has moderately decreased function.  ? 2. Right ventricular systolic function is normal. The right ventricular  ?size is normal.  ? 3. The inferior vena cava is normal in size with greater than  50%  ?respiratory variability, suggesting right atrial pressure of 3 mmHg.  ? 4. Limited echo to evaluate LV function  ? ?Assessment and Plan: ? ?1.  Multivessel CAD status post CABG with subsequently documente

## 2021-09-02 ENCOUNTER — Encounter: Payer: Self-pay | Admitting: Cardiology

## 2021-09-02 ENCOUNTER — Ambulatory Visit: Payer: Medicare Other | Admitting: Cardiology

## 2021-09-02 ENCOUNTER — Ambulatory Visit (INDEPENDENT_AMBULATORY_CARE_PROVIDER_SITE_OTHER): Payer: Medicare Other

## 2021-09-02 ENCOUNTER — Ambulatory Visit: Payer: Medicare Other | Admitting: Podiatry

## 2021-09-02 VITALS — BP 90/48 | HR 84 | Ht 64.0 in | Wt 133.4 lb

## 2021-09-02 DIAGNOSIS — Z953 Presence of xenogenic heart valve: Secondary | ICD-10-CM

## 2021-09-02 DIAGNOSIS — I25119 Atherosclerotic heart disease of native coronary artery with unspecified angina pectoris: Secondary | ICD-10-CM | POA: Diagnosis not present

## 2021-09-02 DIAGNOSIS — I5020 Unspecified systolic (congestive) heart failure: Secondary | ICD-10-CM

## 2021-09-02 DIAGNOSIS — S92405A Nondisplaced unspecified fracture of left great toe, initial encounter for closed fracture: Secondary | ICD-10-CM

## 2021-09-02 NOTE — Patient Instructions (Signed)
Medication Instructions:   Your physician recommends that you continue on your current medications as directed. Please refer to the Current Medication list given to you today.  Labwork:  none  Testing/Procedures:  none  Follow-Up:  Your physician recommends that you schedule a follow-up appointment in: 3 months.  Any Other Special Instructions Will Be Listed Below (If Applicable).  If you need a refill on your cardiac medications before your next appointment, please call your pharmacy. 

## 2021-09-02 NOTE — Progress Notes (Signed)
? ?HPI: 88 y.o. female presenting today for follow-up evaluation of a toe fracture that occurred on 06/17/2021 when she tripped and she was taken to the urgent care and diagnosed with fracture.  Patient continues to do well.  She continues to have some slight sensitivity with swelling throughout the day.  Presenting for further treatment and evaluation ? ?Past Medical History:  ?Diagnosis Date  ? Anemia   ? iron  ? Anxiety   ? Aortic stenosis   ? Bioprosthetic AVR 2006  ? CAD (coronary artery disease), native coronary artery   ? Multivessel status post CABG 2006, occluded SVG to circumflex, DES to circumflex July 2020  ? Cardiomyopathy (HCC)   ? Chronic kidney disease   ? Depression   ? Dysphagia   ? Essential hypertension   ? Family history of adverse reaction to anesthesia   ? N&V  ? GERD (gastroesophageal reflux disease)   ? Headache   ? History of hiatal hernia   ? HOH (hard of hearing)   ? LBBB (left bundle branch block)   ? Mixed hyperlipidemia   ? NSTEMI (non-ST elevated myocardial infarction) (HCC)   ? July 2020  ? Osteoarthritis   ? Postoperative nausea and vomiting   ? Morphine  ? Type 2 diabetes mellitus (HCC)   ? UTI (lower urinary tract infection)   ? ? ?Past Surgical History:  ?Procedure Laterality Date  ? ABDOMINAL HYSTERECTOMY    ? partial  ? AORTIC VALVE REPLACEMENT  2006  ? Dr. Cornelius Moras - 23 mm [place] stentless cadaver valve  ? CARDIAC CATHETERIZATION  2020  ? with stents  ? CHOLECYSTECTOMY    ? CORONARY ARTERY BYPASS GRAFT  2006   ? Dr. Cornelius Moras - LIMA to LAD, SVG to circumflex  ? CORONARY STENT INTERVENTION N/A 11/06/2018  ? Procedure: CORONARY STENT INTERVENTION;  Surgeon: Yvonne Kendall, MD;  [facility];  Service: Cardiovascular;  Laterality: N/A;  ? CORONARY/GRAFT ANGIOGRAPHY N/A 11/06/2018  ? Procedure: CORONARY/GRAFT ANGIOGRAPHY;  Surgeon: Yvonne Kendall, MD;  [facility]: [facility];  Service: Cardiovascular;  Laterality: N/A;  ? LEFT HEART CATH AND CORS/GRAFTS ANGIOGRAPHY  N/A 11/07/2020  ? Procedure: LEFT HEART CATH AND CORS/GRAFTS ANGIOGRAPHY;  Surgeon: Kathleene Hazel, MD;  [facility]: [facility];  Service: Cardiovascular;  Laterality: N/A;  ? THYROID LOBECTOMY Right   ? TOTAL HIP ARTHROPLASTY Right 03/01/2017  ? Procedure: RIGHT TOTAL HIP ARTHROPLASTY ANTERIOR APPROACH;  Surgeon: Durene Romans, MD;  Location: [facility];  Service: Orthopedics;  Laterality: Right;  70 mins  ? TOTAL HIP ARTHROPLASTY Left 04/29/2020  ? Procedure: TOTAL HIP ARTHROPLASTY ANTERIOR APPROACH;  Surgeon: Durene Romans, MD;  Location: [facility];  Service: Orthopedics;  Laterality: Left;  70 mins  ? TOTAL KNEE ARTHROPLASTY Right 2006  ? TOTAL KNEE ARTHROPLASTY Left 05/20/2014  ? DR Thurston Hole  ? TOTAL KNEE ARTHROPLASTY Left 05/20/2014  ? Procedure: LEFT TOTAL KNEE ARTHROPLASTY;  Surgeon: Nilda Simmer, MD;  Location: [facility];  Service: Orthopedics;  Laterality: Left;  ? ? ?Allergies  ?Allergen Reactions  ? Lipitor [Atorvastatin]   ?  MUSCLE ACHES/FATIGUE  ? Morphine Nausea And Vomiting  ? Nitroglycerin Other (See Comments)  ?  Very sensitive, drops BP significantly per pt  ? Penicillins Swelling and Other (See Comments)  ?  Tolerated Cephalosporin Date: 04/29/20. ? ?Severe arm swelling and redness ?Did it involve swelling of the face/tongue/throat, SOB, or low BP? No ?Did it involve sudden or severe rash/hives, skin  peeling, or any reaction on the inside of your mouth or nose? No ?Did you need to seek medical attention at a hospital or doctor's office? No ?When did it last happen?      30 years ?If all above answers are "NO", may proceed with cephalosporin use.  ? Reclast [Zoledronic Acid]   ?  "nausea and vomiting. Could not get out of bed.  ? ?  ?Physical Exam: ?General: The patient is alert and oriented x3 in no acute distress. ? ?Dermatology: Skin is warm, dry and supple bilateral lower extremities. Negative for open lesions or macerations. ? ?Vascular: Palpable pedal pulses bilaterally. Capillary refill  within normal limits.  No ecchymosis or edema noted ? ?Neurological: Light touch and protective threshold grossly intact ? ?Musculoskeletal Exam: No pedal deformities noted.  Overall there is decent alignment of the great toe.  No tenderness to palpation. ? ?Radiographic Exam LT foot: ?Overall improvement since left foot x-rays taken.  Compared to prior x-rays and demonstrates good routine healing of the fracture fragment.  This should continue to heal uneventfully ? ?Assessment: ?1.  Fracture head of the proximal phalanx left hallux, closed, nondisplaced, subsequent encounter with routine healing ? ?Plan of Care:  ?1. Patient evaluated.  New x-rays were reviewed today and compared to prior x-rays. ?2.  Continue wearing good supportive tennis shoes and sneakers that do not irritate the toe ?3.  Explained to the patient that although the bone is slow to heal and the fracture is slow to heal she is doing well and she may continue ambulating as tolerated with full activity given her age.  ?4.  Return to clinic as needed ?  ?  ?Felecia Shelling, DPM ?[facility] ? ?Dr. Felecia Shelling, DPM  ?  ?[address]                                        ?[place], [place] [address]                ?Office [phone]  ?Fax [phone] ? ? ? ? ?

## 2021-09-09 ENCOUNTER — Telehealth: Payer: Self-pay | Admitting: Cardiology

## 2021-09-09 NOTE — Telephone Encounter (Signed)
?  Pt c/o medication issue: ? ?1. Name of Medication: ranolazine (RANEXA) 500 MG 12 hr tablet ? ?2. How are you currently taking this medication (dosage and times per day)? Take 1 tablet (500 mg total) by mouth 2 (two) times daily. ? ?3. Are you having a reaction (difficulty breathing--STAT)?  ? ?4. What is your medication issue? Pt's daughter said, this medication is helping pt but its making her constipated. They are asking if there's something the pt can take to help her constipation ?

## 2021-09-09 NOTE — Telephone Encounter (Signed)
Says she's already used colace 200 mg daily with little relief. Advised that miralax is safe to use and encouraged to increase fluid intake (water). Verbalized understanding.  ? ?

## 2021-10-08 ENCOUNTER — Telehealth: Payer: Self-pay | Admitting: Cardiology

## 2021-10-08 NOTE — Telephone Encounter (Signed)
Pt c/o medication issue:  1. Name of Medication: ranolazine (RANEXA) 500 MG 12 hr tablet  2. How are you currently taking this medication (dosage and times per day)? Stopped 09/24/21  3. Are you having a reaction (difficulty breathing--STAT)? No   4. What is your medication issue? Daughter called to inform the office pt is no longer taking this medication and is back on the isosorbide mononitrate.

## 2021-10-08 NOTE — Telephone Encounter (Signed)
Ranolazine removed from patients current medication list.

## 2021-11-04 ENCOUNTER — Telehealth: Payer: Self-pay | Admitting: Cardiology

## 2021-11-04 NOTE — Telephone Encounter (Signed)
Pt c/o medication issue:  1. Name of Medication:  isosorbide mononitrate (IMDUR) 30 MG 24 hr tablet   2. How are you currently taking this medication (dosage and times per day)?   As prescribed  3. Are you having a reaction (difficulty breathing--STAT)?   No  4. What is your medication issue?   Daughter called stating patient is not feeling well taking this medication.  Daughter stated patient does not have any energy and feels very tired since she re-started this medication.

## 2021-11-04 NOTE — Telephone Encounter (Signed)
Daughter states that about a year ago, patient was taking isosorbide which caused her to feel really fatigued and made her want to sleep a lot, therefore she stopped taking it. States that after patients last visit with Dr. Diona Browner, she started taking ranolazine which started to cause her constipation. States that she has since stopped taking that medication and started back on the isosorbide 3 weeks ago, which is causing the same symptoms as before. Wants to know if there is something else that she can take that will take care of the angina attacks and not cause constipation and fatigue. Please advise

## 2021-11-05 MED ORDER — ISOSORBIDE DINITRATE 10 MG PO TABS: ORAL_TABLET | ORAL | 1 refills | Status: DC

## 2021-11-05 NOTE — Telephone Encounter (Signed)
Daughter made aware, verbalized understanding. Request that isosorbide dinitrate 10 mg 2-3 times a day be called in to [facility] in [place].

## 2021-11-22 ENCOUNTER — Encounter (HOSPITAL_COMMUNITY): Payer: Self-pay | Admitting: *Deleted

## 2021-11-22 ENCOUNTER — Emergency Department (HOSPITAL_COMMUNITY): Payer: Medicare Other

## 2021-11-22 ENCOUNTER — Observation Stay (HOSPITAL_COMMUNITY)
Admission: EM | Admit: 2021-11-22 | Discharge: 2021-11-23 | Disposition: A | Discharge status: Home / Self Care | Payer: Medicare Other | Attending: Internal Medicine | Admitting: Internal Medicine

## 2021-11-22 ENCOUNTER — Other Ambulatory Visit: Payer: Self-pay

## 2021-11-22 DIAGNOSIS — E039 Hypothyroidism, unspecified: Secondary | ICD-10-CM | POA: Insufficient documentation

## 2021-11-22 DIAGNOSIS — I130 Hypertensive heart and chronic kidney disease with heart failure and stage 1 through stage 4 chronic kidney disease, or unspecified chronic kidney disease: Secondary | ICD-10-CM | POA: Diagnosis not present

## 2021-11-22 DIAGNOSIS — Z20822 Contact with and (suspected) exposure to covid-19: Secondary | ICD-10-CM | POA: Insufficient documentation

## 2021-11-22 DIAGNOSIS — F32A Depression, unspecified: Secondary | ICD-10-CM | POA: Diagnosis present

## 2021-11-22 DIAGNOSIS — I5032 Chronic diastolic (congestive) heart failure: Secondary | ICD-10-CM | POA: Diagnosis not present

## 2021-11-22 DIAGNOSIS — Z79899 Other long term (current) drug therapy: Secondary | ICD-10-CM | POA: Diagnosis not present

## 2021-11-22 DIAGNOSIS — N189 Chronic kidney disease, unspecified: Secondary | ICD-10-CM | POA: Insufficient documentation

## 2021-11-22 DIAGNOSIS — R072 Precordial pain: Principal | ICD-10-CM | POA: Insufficient documentation

## 2021-11-22 DIAGNOSIS — Z7984 Long term (current) use of oral hypoglycemic drugs: Secondary | ICD-10-CM | POA: Insufficient documentation

## 2021-11-22 DIAGNOSIS — Z951 Presence of aortocoronary bypass graft: Secondary | ICD-10-CM | POA: Diagnosis not present

## 2021-11-22 DIAGNOSIS — I25119 Atherosclerotic heart disease of native coronary artery with unspecified angina pectoris: Secondary | ICD-10-CM | POA: Insufficient documentation

## 2021-11-22 DIAGNOSIS — Z955 Presence of coronary angioplasty implant and graft: Secondary | ICD-10-CM | POA: Insufficient documentation

## 2021-11-22 DIAGNOSIS — I10 Essential (primary) hypertension: Secondary | ICD-10-CM | POA: Diagnosis present

## 2021-11-22 DIAGNOSIS — D72829 Elevated white blood cell count, unspecified: Secondary | ICD-10-CM | POA: Insufficient documentation

## 2021-11-22 DIAGNOSIS — K219 Gastro-esophageal reflux disease without esophagitis: Secondary | ICD-10-CM | POA: Diagnosis present

## 2021-11-22 DIAGNOSIS — E785 Hyperlipidemia, unspecified: Secondary | ICD-10-CM | POA: Diagnosis present

## 2021-11-22 DIAGNOSIS — R079 Chest pain, unspecified: Secondary | ICD-10-CM

## 2021-11-22 DIAGNOSIS — Z96653 Presence of artificial knee joint, bilateral: Secondary | ICD-10-CM | POA: Diagnosis not present

## 2021-11-22 DIAGNOSIS — Z8679 Personal history of other diseases of the circulatory system: Secondary | ICD-10-CM

## 2021-11-22 DIAGNOSIS — Z7982 Long term (current) use of aspirin: Secondary | ICD-10-CM | POA: Insufficient documentation

## 2021-11-22 DIAGNOSIS — Z96643 Presence of artificial hip joint, bilateral: Secondary | ICD-10-CM | POA: Insufficient documentation

## 2021-11-22 DIAGNOSIS — E119 Type 2 diabetes mellitus without complications: Secondary | ICD-10-CM | POA: Insufficient documentation

## 2021-11-22 LAB — CBC WITH DIFFERENTIAL/PLATELET
Abs Immature Granulocytes: 0.03 10*3/uL (ref 0.00–0.07)
Basophils Absolute: 0 10*3/uL (ref 0.0–0.1)
Basophils Relative: 0 %
Eosinophils Absolute: 0 10*3/uL (ref 0.0–0.5)
Eosinophils Relative: 0 %
HCT: 37.1 % (ref 36.0–46.0)
Hemoglobin: 12.1 g/dL (ref 12.0–15.0)
Immature Granulocytes: 0 %
Lymphocytes Relative: 16 %
Lymphs Abs: 1.9 10*3/uL (ref 0.7–4.0)
MCH: 31.3 pg (ref 26.0–34.0)
MCHC: 32.6 g/dL (ref 30.0–36.0)
MCV: 96.1 fL (ref 80.0–100.0)
Monocytes Absolute: 0.5 10*3/uL (ref 0.1–1.0)
Monocytes Relative: 4 %
Neutro Abs: 9.5 10*3/uL — ABNORMAL HIGH (ref 1.7–7.7)
Neutrophils Relative %: 80 %
Platelets: 276 10*3/uL (ref 150–400)
RBC: 3.86 MIL/uL — ABNORMAL LOW (ref 3.87–5.11)
RDW: 13 % (ref 11.5–15.5)
WBC: 12.1 10*3/uL — ABNORMAL HIGH (ref 4.0–10.5)
nRBC: 0 % (ref 0.0–0.2)

## 2021-11-22 LAB — BASIC METABOLIC PANEL
Anion gap: 11 (ref 5–15)
BUN: 35 mg/dL — ABNORMAL HIGH (ref 8–23)
CO2: 29 mmol/L (ref 22–32)
Calcium: 9.3 mg/dL (ref 8.9–10.3)
Chloride: 97 mmol/L — ABNORMAL LOW (ref 98–111)
Creatinine, Ser: 1.18 mg/dL — ABNORMAL HIGH (ref 0.44–1.00)
GFR, Estimated: 43 mL/min — ABNORMAL LOW (ref >60)
Glucose, Bld: 142 mg/dL — ABNORMAL HIGH (ref 70–99)
Potassium: 3.3 mmol/L — ABNORMAL LOW (ref 3.5–5.1)
Sodium: 137 mmol/L (ref 135–145)

## 2021-11-22 LAB — TROPONIN I (HIGH SENSITIVITY): Troponin I (High Sensitivity): 8 ng/L (ref <18)

## 2021-11-22 NOTE — ED Notes (Signed)
Patient transported to X-ray 

## 2021-11-22 NOTE — ED Notes (Signed)
ED Provider at bedside. 

## 2021-11-22 NOTE — ED Triage Notes (Signed)
Pt arrived to er by ems after family called them out for chest pain, pt reports that she took 1 nitro prior to ems arrival with a little improvement of the chest pain, along with a dose of 324 Asprin,  ems also gave pt 1 nitro as well improvement of chest pain as well,  Pt states that the chest pain started a few hours ago, describes it as pressure across the entire chest area, denies any n/v, sob, or breaking out in a sweat.

## 2021-11-22 NOTE — ED Provider Notes (Signed)
[facility] EMERGENCY DEPARTMENT Provider Note   CSN: [id number] Arrival date & time: 11/22/21  2102     History {Add pertinent medical, surgical, social history, OB history to HPI:1} Chief Complaint  Patient presents with   Chest Pain    Erin Herman is a 88 y.o. female.  Patient with history of bypass surgery and a stent.  She had pain in her chest today and took 2 nitros with relief   Chest Pain      Home Medications Prior to Admission medications   Medication Sig Start Date End Date Taking? Authorizing Provider  acetaminophen (TYLENOL) 325 MG tablet Take 2 tablets (650 mg total) by mouth every 6 (six) hours as needed. Patient taking differently: Take 650 mg by mouth every 6 (six) hours as needed for mild pain. 06/18/21   Wallis Bamberg, PA-C  APPLE CIDER VINEGAR PO Take 1 tablet by mouth every morning.    [provider]  aspirin EC 81 MG tablet Take 1 tablet (81 mg total) by mouth daily. Swallow whole. 06/26/20   Jonelle Sidle, MD  Cholecalciferol (VITAMIN D3) 125 MCG (5000 UT) CAPS Take 5,000 Units by mouth daily.    [provider]  CINNAMON PO Take 1 tablet by mouth 2 (two) times daily.    [provider]  Coenzyme Q10 (COQ10 PO) Take 1 tablet by mouth daily.    [provider]  CRANBERRY PO Take 1 tablet by mouth daily.    [provider]  Cyanocobalamin (B-12) 2500 MCG TABS Take 2,500 mcg by mouth daily.    [provider]  famotidine (PEPCID) 20 MG tablet Take 10 mg by mouth 2 (two) times daily.    [provider]  furosemide (LASIX) 20 MG tablet Take 40mg  in am and 20mg  pm. 07/30/21   , Pratik D, DO  isosorbide dinitrate (ISORDIL) 10 MG tablet Take one tablet by mouth two to three times a day. 11/05/21   Sherryll Burger, MD  levothyroxine (SYNTHROID) 88 MCG tablet Take 88 mcg by mouth daily before breakfast.    [provider]  metFORMIN (GLUCOPHAGE) 500 MG tablet Take 1 tablet (500  mg total) by mouth 2 (two) times daily with a meal. 11/10/18   Mikhail, Jonelle Sidle, DO  metoprolol succinate (TOPROL XL) 25 MG 24 hr tablet Take 1 tablet (25 mg total) by mouth daily. 01/07/21   Nita Sells., NP  Multiple Vitamins-Minerals (HAIR/SKIN/NAILS/BIOTIN) TABS Take 1 tablet by mouth daily.    [provider]  [facility] ULTRA test strip USE 1 STRIP TO CHECK GLUCOSE ONCE DAILY 07/29/21   [provider]  Polyethyl Glycol-Propyl Glycol (SYSTANE OP) Place 1 drop into both eyes as needed (dry eye).    [provider]  potassium chloride SA (K-DUR,KLOR-CON) 20 MEQ tablet Take 1 tablet (20 mEq total) by mouth daily after supper. 01/26/17   09/28/21, MD  psyllium (METAMUCIL) 58.6 % packet Take 1 packet by mouth daily.    [provider]  rosuvastatin (CRESTOR) 5 MG tablet Take 1 tablet (5 mg total) by mouth daily. 01/06/21   Jonelle Sidle., NP  sertraline (ZOLOFT) 100 MG tablet Take 100 mg by mouth at bedtime.    [provider]  spironolactone (ALDACTONE) 25 MG tablet Take 0.5 tablets (12.5 mg total) by mouth daily. 07/06/16   Netta Neat, MD  vitamin C (ASCORBIC ACID) 500 MG tablet Take 500 mg by mouth daily.  [provider]  Vitamin E 180 MG (400 UNIT) CAPS Take 400 Units by mouth daily.    [provider]      Allergies    Lipitor [atorvastatin], Morphine, Nitroglycerin, Penicillins, and Reclast [zoledronic acid]    Review of Systems   Review of Systems  Cardiovascular:  Positive for chest pain.    Physical Exam Updated Vital Signs BP 118/67   Pulse 78   Temp 97.8 F (36.6 C) (Oral)   Resp 16   Ht 5\' 4"  (1.626 m)   Wt 59 kg   LMP  (LMP Unknown)   SpO2 99%   BMI 22.31 kg/m  Physical Exam  ED Results / Procedures / Treatments   Labs (all labs ordered are listed, but only abnormal results are displayed) Labs Reviewed  BASIC METABOLIC PANEL - Abnormal; Notable for the following components:       Result Value   Potassium 3.3 (*)    Chloride 97 (*)    Glucose, Bld 142 (*)    BUN 35 (*)    Creatinine, Ser 1.18 (*)    GFR, Estimated 43 (*)    All other components within normal limits  CBC WITH DIFFERENTIAL/PLATELET - Abnormal; Notable for the following components:   WBC 12.1 (*)    RBC 3.86 (*)    Neutro Abs 9.5 (*)    All other components within normal limits  TROPONIN I (HIGH SENSITIVITY)  TROPONIN I (HIGH SENSITIVITY)    EKG EKG Interpretation  Date/Time:  Sunday November 22 2021 21:12:36 EDT Ventricular Rate:  82 PR Interval:  219 QRS Duration: 168 QT Interval:  453 QTC Calculation: 530 R Axis:   204 Text Interpretation: Sinus rhythm Borderline prolonged PR interval Consider left atrial enlargement Nonspecific intraventricular conduction delay Borderline T abnormalities, lateral leads Confirmed by 11-03-2001 [phone]) on 11/22/2021 10:17:50 PM  Radiology DG Chest 2 View  Result Date: 11/22/2021 CLINICAL DATA:  Chest pain EXAM: CHEST - 2 VIEW COMPARISON:  08/09/2021 FINDINGS: Cardiac shadow is enlarged but stable. Postsurgical changes are noted. Aortic calcifications are seen. The lungs are hyper aerated bilaterally. No focal infiltrate or effusion is seen. No bony abnormality is noted. IMPRESSION: Hyperinflation without acute abnormality. Electronically Signed   By: 08/11/2021 M.D.   On: 11/22/2021 21:45    Procedures Procedures  {Document cardiac monitor, telemetry assessment procedure when appropriate:1}  Medications Ordered in ED Medications - No data to display  ED Course/ Medical Decision Making/ A&P                           Medical Decision Making Amount and/or Complexity of Data Reviewed Labs: ordered. Radiology: ordered.   Patient with chest pain and coronary artery disease.  {Document critical care time when appropriate:1} {Document review of labs and clinical decision tools ie heart score, Chads2Vasc2 etc:1}  {Document your independent  review of radiology images, and any outside records:1} {Document your discussion with family members, caretakers, and with consultants:1} {Document social determinants of health affecting pt's care:1} {Document your decision making why or why not admission, treatments were needed:1} Final Clinical Impression(s) / ED Diagnoses Final diagnoses:  None    Rx / DC Orders ED Discharge Orders     None

## 2021-11-23 ENCOUNTER — Observation Stay (HOSPITAL_BASED_OUTPATIENT_CLINIC_OR_DEPARTMENT_OTHER): Payer: Medicare Other

## 2021-11-23 DIAGNOSIS — R079 Chest pain, unspecified: Secondary | ICD-10-CM

## 2021-11-23 DIAGNOSIS — F32A Depression, unspecified: Secondary | ICD-10-CM

## 2021-11-23 DIAGNOSIS — D72829 Elevated white blood cell count, unspecified: Secondary | ICD-10-CM

## 2021-11-23 DIAGNOSIS — I10 Essential (primary) hypertension: Secondary | ICD-10-CM

## 2021-11-23 DIAGNOSIS — E039 Hypothyroidism, unspecified: Secondary | ICD-10-CM

## 2021-11-23 DIAGNOSIS — E782 Mixed hyperlipidemia: Secondary | ICD-10-CM

## 2021-11-23 DIAGNOSIS — K219 Gastro-esophageal reflux disease without esophagitis: Secondary | ICD-10-CM

## 2021-11-23 DIAGNOSIS — I5032 Chronic diastolic (congestive) heart failure: Secondary | ICD-10-CM | POA: Diagnosis not present

## 2021-11-23 LAB — COMPREHENSIVE METABOLIC PANEL
ALT: 28 U/L (ref 0–44)
AST: 29 U/L (ref 15–41)
Albumin: 3.5 g/dL (ref 3.5–5.0)
Alkaline Phosphatase: 72 U/L (ref 38–126)
Anion gap: 8 (ref 5–15)
BUN: 31 mg/dL — ABNORMAL HIGH (ref 8–23)
CO2: 30 mmol/L (ref 22–32)
Calcium: 8.9 mg/dL (ref 8.9–10.3)
Chloride: 101 mmol/L (ref 98–111)
Creatinine, Ser: 1.12 mg/dL — ABNORMAL HIGH (ref 0.44–1.00)
GFR, Estimated: 46 mL/min — ABNORMAL LOW (ref >60)
Glucose, Bld: 132 mg/dL — ABNORMAL HIGH (ref 70–99)
Potassium: 4.1 mmol/L (ref 3.5–5.1)
Sodium: 139 mmol/L (ref 135–145)
Total Bilirubin: 1 mg/dL (ref 0.3–1.2)
Total Protein: 6.2 g/dL — ABNORMAL LOW (ref 6.5–8.1)

## 2021-11-23 LAB — ECHOCARDIOGRAM COMPLETE
AR max vel: 1.69 cm2
AV Area VTI: 1.7 cm2
AV Area mean vel: 1.7 cm2
AV Mean grad: 9 mmHg
AV Peak grad: 18 mmHg
Ao pk vel: 2.12 m/s
Area-P 1/2: 3.16 cm2
Calc EF: 35.4 %
Height: 64 in
MV M vel: 4.49 m/s
MV Peak grad: 80.6 mmHg
MV VTI: 1.96 cm2
Radius: 0.65 cm
S' Lateral: 3.9 cm
Single Plane A2C EF: 43.4 %
Single Plane A4C EF: 32.7 %
Weight: 2105.83 oz

## 2021-11-23 LAB — CBC
HCT: 30.8 % — ABNORMAL LOW (ref 36.0–46.0)
Hemoglobin: 10 g/dL — ABNORMAL LOW (ref 12.0–15.0)
MCH: 31.3 pg (ref 26.0–34.0)
MCHC: 32.5 g/dL (ref 30.0–36.0)
MCV: 96.6 fL (ref 80.0–100.0)
Platelets: 222 10*3/uL (ref 150–400)
RBC: 3.19 MIL/uL — ABNORMAL LOW (ref 3.87–5.11)
RDW: 13.1 % (ref 11.5–15.5)
WBC: 11.7 10*3/uL — ABNORMAL HIGH (ref 4.0–10.5)
nRBC: 0 % (ref 0.0–0.2)

## 2021-11-23 LAB — TROPONIN I (HIGH SENSITIVITY)
Troponin I (High Sensitivity): 8 ng/L (ref <18)
Troponin I (High Sensitivity): 8 ng/L (ref <18)

## 2021-11-23 LAB — URINALYSIS, ROUTINE W REFLEX MICROSCOPIC
Bilirubin Urine: NEGATIVE
Glucose, UA: NEGATIVE mg/dL
Ketones, ur: NEGATIVE mg/dL
Nitrite: NEGATIVE
Protein, ur: NEGATIVE mg/dL
Specific Gravity, Urine: 1.01 (ref 1.005–1.030)
WBC, UA: >50 WBC/hpf — ABNORMAL HIGH (ref 0–5)
pH: 6 (ref 5.0–8.0)

## 2021-11-23 LAB — TSH: TSH: 1.046 u[IU]/mL (ref 0.350–4.500)

## 2021-11-23 LAB — SARS CORONAVIRUS 2 BY RT PCR: SARS Coronavirus 2 by RT PCR: NEGATIVE

## 2021-11-23 MED ORDER — SPIRONOLACTONE 12.5 MG HALF TABLET
12.5000 mg | ORAL_TABLET | Freq: Every day | ORAL | Status: DC
  Administered 2021-11-23: 12.5 mg via ORAL
  Filled 2021-11-23: qty 1

## 2021-11-23 MED ORDER — POTASSIUM CHLORIDE 20 MEQ PO PACK
40.0000 meq | PACK | Freq: Once | ORAL | Status: AC
  Administered 2021-11-23: 40 meq via ORAL
  Filled 2021-11-23: qty 2

## 2021-11-23 MED ORDER — ACETAMINOPHEN 325 MG PO TABS: 650.0000 mg | ORAL_TABLET | ORAL | Status: DC | PRN

## 2021-11-23 MED ORDER — ASPIRIN 81 MG PO TBEC
81.0000 mg | DELAYED_RELEASE_TABLET | Freq: Every day | ORAL | Status: DC
  Administered 2021-11-23: 81 mg via ORAL
  Filled 2021-11-23: qty 1

## 2021-11-23 MED ORDER — ISOSORBIDE DINITRATE 20 MG PO TABS
20.0000 mg | ORAL_TABLET | Freq: Two times a day (BID) | ORAL | Status: DC
  Administered 2021-11-23: 20 mg via ORAL
  Filled 2021-11-23 (×2): qty 1

## 2021-11-23 MED ORDER — ROSUVASTATIN CALCIUM 10 MG PO TABS
5.0000 mg | ORAL_TABLET | Freq: Every day | ORAL | Status: DC
  Administered 2021-11-23: 5 mg via ORAL
  Filled 2021-11-23: qty 1

## 2021-11-23 MED ORDER — HEPARIN SODIUM (PORCINE) 5000 UNIT/ML IJ SOLN
5000.0000 [IU] | Freq: Three times a day (TID) | INTRAMUSCULAR | Status: DC
  Administered 2021-11-23 (×2): 5000 [IU] via SUBCUTANEOUS
  Filled 2021-11-23 (×2): qty 1

## 2021-11-23 MED ORDER — FUROSEMIDE 20 MG PO TABS
20.0000 mg | ORAL_TABLET | Freq: Every day | ORAL | Status: DC
  Administered 2021-11-23: 20 mg via ORAL
  Filled 2021-11-23: qty 1

## 2021-11-23 MED ORDER — FAMOTIDINE 20 MG PO TABS
10.0000 mg | ORAL_TABLET | Freq: Two times a day (BID) | ORAL | Status: DC
  Administered 2021-11-23: 10 mg via ORAL
  Filled 2021-11-23 (×2): qty 1

## 2021-11-23 MED ORDER — LEVOTHYROXINE SODIUM 88 MCG PO TABS
88.0000 ug | ORAL_TABLET | Freq: Every day | ORAL | Status: DC
  Administered 2021-11-23: 88 ug via ORAL
  Filled 2021-11-23: qty 1

## 2021-11-23 MED ORDER — SERTRALINE HCL 50 MG PO TABS
100.0000 mg | ORAL_TABLET | Freq: Every day | ORAL | Status: DC
Start: 2021-11-23 — End: 2021-11-23
  Administered 2021-11-23: 100 mg via ORAL
  Filled 2021-11-23: qty 2

## 2021-11-23 MED ORDER — ONDANSETRON HCL 4 MG/2ML IJ SOLN: 4.0000 mg | Freq: Four times a day (QID) | INTRAMUSCULAR | Status: DC | PRN

## 2021-11-23 MED ORDER — ISOSORBIDE DINITRATE 20 MG PO TABS: 20.0000 mg | ORAL_TABLET | Freq: Two times a day (BID) | ORAL | 0 refills | Status: DC

## 2021-11-23 MED ORDER — METOPROLOL SUCCINATE ER 25 MG PO TB24
25.0000 mg | ORAL_TABLET | Freq: Every day | ORAL | Status: DC
  Filled 2021-11-23: qty 1

## 2021-11-23 NOTE — ED Provider Notes (Signed)
  Physical Exam  BP 136/69 (BP Location: Left Arm)   Pulse 70   Temp 98.9 F (37.2 C) (Oral)   Resp 18   Ht 5\' 4"  (1.626 m)   Wt 59.7 kg   LMP  (LMP Unknown)   SpO2 98%   BMI 22.59 kg/m   Physical Exam Vitals and nursing note reviewed.  Constitutional:      Appearance: She is well-developed.  Pulmonary:     Effort: Pulmonary effort is normal.  Neurological:     Mental Status: She is alert.     Procedures  Procedures  ED Course / MDM  Care assumed from Dr. .  Plan for this patient is admission once second troponin has returned negative.  I have discussed care with the hospitalist who agrees to admit.       Estell Harpin, MD 11/23/21 [phone]

## 2021-11-23 NOTE — Assessment & Plan Note (Addendum)
-  EKG without acute ischemic changes -Started with exertion -better with nitro -Echo in the AM -Trop normal x 2 -Increase Imdur -Monitor on tele

## 2021-11-23 NOTE — Assessment & Plan Note (Signed)
-  WBC 12 -Most likely 2/2 stress reaction -CXR is negative for acute infiltrate -Covid pending -UA pending

## 2021-11-23 NOTE — Assessment & Plan Note (Signed)
Continue pepcid

## 2021-11-23 NOTE — Progress Notes (Signed)
  Transition of Care [facility]) Screening Note   Patient Details  Name: Erin Herman Date of Birth: June 14, 1927   Transition of Care [facility]) CM/SW Contact:    Villa Herb, LCSWA Phone Number: 11/23/2021, 11:36 AM    Transition of Care Department [facility]) has reviewed patient and no TOC needs have been identified at this time. We will continue to monitor patient advancement through interdisciplinary progression rounds. If new patient transition needs arise, please place a TOC consult.

## 2021-11-23 NOTE — Progress Notes (Signed)
*  PRELIMINARY RESULTS* Echocardiogram 2D Echocardiogram has been performed.  Carolyne Fiscal 11/23/2021, 12:53 PM

## 2021-11-23 NOTE — Assessment & Plan Note (Signed)
-  check TSH -Continue synthroid

## 2021-11-23 NOTE — Progress Notes (Signed)
Patient to floor from ER. Alert and oriented x4. Oriented to room. Patient resting comfortably with call bell in reach and bed alarms on.

## 2021-11-23 NOTE — Assessment & Plan Note (Addendum)
-  Continue asa, statin, beta blocker, aldactone

## 2021-11-23 NOTE — Progress Notes (Signed)
Patient discharged home today, transported home by family. Discharge paperwork went over with patient and daughter, both verbalized understanding. Belongings sent home with patient.  

## 2021-11-23 NOTE — Assessment & Plan Note (Signed)
-  Continue zoloft  

## 2021-11-23 NOTE — Discharge Summary (Signed)
Physician Discharge Summary  Erin Herman [id number] DOB: 1927-12-07 DOA: 11/22/2021  PCP: Monico Blitz, MD  Admit date: 11/22/2021  Discharge date: 11/23/2021  Admitted From:Home  Disposition:  Home  Recommendations for Outpatient Follow-up:  Follow up with PCP in 1-2 weeks Follow-up with cardiology, Dr. Domenic Polite as scheduled on 8/21 and started on Imdur 20 mg twice daily as prescribed to help improve symptoms of angina Continue other home medications as prior  Home Health: None  Equipment/Devices: None  Discharge Condition:Stable  CODE STATUS: Full  Diet recommendation: Heart Healthy/carb modified  Brief/Interim Summary: Erin Herman is a 88 y.o. female with medical history significant of anxiety, coronary artery disease status post CABG and stent, cardiomyopathy, depression, hypertension, GERD, hyperlipidemia, type 2 diabetes mellitus, and more presents ED with a chief complaint of chest pain.  She did not have any troponin elevation or changes to her EKG and chest pain had resolved with nitroglycerin.  She follows up with her cardiologist Dr. Domenic Polite regularly.  She had no further chest pain throughout the course of this admission and had 2D echocardiogram which appeared stable compared to prior.  Her Imdur dose was increased to help assist with her anginal symptoms and she will follow-up with cardiology as noted above.  No other acute events noted and she is stable for discharge.  Discharge Diagnoses:  Principal Problem:   Chest pain Active Problems:   Hyperlipidemia   Essential hypertension, benign   Chronic diastolic heart failure (HCC)   GERD (gastroesophageal reflux disease)   Depression   Hypothyroidism   Leukocytosis  Principal discharge diagnosis: Chest pain related to chronic angina.  Discharge Instructions  Discharge Instructions     Diet - low sodium heart healthy   Complete by: As directed    Increase activity slowly   Complete by: As  directed       Allergies as of 11/23/2021       Reactions   Lipitor [atorvastatin]    MUSCLE ACHES/FATIGUE   Morphine Nausea And Vomiting   Nitroglycerin Other (See Comments)   Very sensitive, drops BP significantly per pt   Penicillins Swelling, Other (See Comments)   Tolerated Cephalosporin Date: 04/29/20. Severe arm swelling and redness Did it involve swelling of the face/tongue/throat, SOB, or low BP? No Did it involve sudden or severe rash/hives, skin peeling, or any reaction on the inside of your mouth or nose? No Did you need to seek medical attention at a hospital or doctor's office? No When did it last happen?      30 years If all above answers are "NO", may proceed with cephalosporin use.   Reclast [zoledronic Acid]    "nausea and vomiting. Could not get out of bed.        Medication List     TAKE these medications    acetaminophen 325 MG tablet Commonly known as: Tylenol Take 2 tablets (650 mg total) by mouth every 6 (six) hours as needed. What changed: reasons to take this   APPLE CIDER VINEGAR PO Take 1 tablet by mouth every morning.   ascorbic acid 500 MG tablet Commonly known as: VITAMIN C Take 500 mg by mouth daily.   aspirin EC 81 MG tablet Take 1 tablet (81 mg total) by mouth daily. Swallow whole.   B-12 2500 MCG Tabs Take 2,500 mcg by mouth daily.   CINNAMON PO Take 1 tablet by mouth 2 (two) times daily.   COQ10 PO Take 1 tablet by mouth daily.   CRANBERRY PO  Take 1 tablet by mouth daily.   famotidine 20 MG tablet Commonly known as: PEPCID Take 10 mg by mouth 2 (two) times daily.   furosemide 40 MG tablet Commonly known as: LASIX Take 40-80 mg by mouth daily as needed for fluid.   Hair/Skin/Nails/Biotin Tabs Take 1 tablet by mouth daily.   isosorbide dinitrate 20 MG tablet Commonly known as: ISORDIL Take 1 tablet (20 mg total) by mouth 2 (two) times daily. What changed:  medication strength how much to take how to take  this when to take this additional instructions   levothyroxine 88 MCG tablet Commonly known as: SYNTHROID Take 88 mcg by mouth daily before breakfast.   metFORMIN 500 MG tablet Commonly known as: GLUCOPHAGE Take 1 tablet (500 mg total) by mouth 2 (two) times daily with a meal.   metoprolol succinate 25 MG 24 hr tablet Commonly known as: Toprol XL Take 1 tablet (25 mg total) by mouth daily.   OneTouch Ultra test strip Generic drug: glucose blood USE 1 STRIP TO CHECK GLUCOSE ONCE DAILY   potassium chloride SA 20 MEQ tablet Commonly known as: KLOR-CON M Take 1 tablet (20 mEq total) by mouth daily after supper.   psyllium 58.6 % packet Commonly known as: METAMUCIL Take 1 packet by mouth daily.   rosuvastatin 5 MG tablet Commonly known as: CRESTOR Take 1 tablet (5 mg total) by mouth daily.   sertraline 50 MG tablet Commonly known as: ZOLOFT Take 50 mg by mouth daily.   spironolactone 25 MG tablet Commonly known as: ALDACTONE Take 0.5 tablets (12.5 mg total) by mouth daily.   SYSTANE OP Place 1 drop into both eyes as needed (dry eye).   Vitamin D3 125 MCG (5000 UT) Caps Take 5,000 Units by mouth daily.   Vitamin E 180 MG (400 UNIT) Caps Take 400 Units by mouth daily.        Follow-up Information     Kirstie Peri, MD. Schedule an appointment as soon as possible for a visit in 1 week(s).   Specialty: Internal Medicine Contact information: [address]  [place] [address] [phone]         Jonelle Sidle, MD. Go to.   Specialty: Cardiology Contact information: [address] [place] [address] [phone]                Allergies  Allergen Reactions   Lipitor [Atorvastatin]     MUSCLE ACHES/FATIGUE   Morphine Nausea And Vomiting   Nitroglycerin Other (See Comments)    Very sensitive, drops BP significantly per pt   Penicillins Swelling and Other (See Comments)    Tolerated Cephalosporin Date: 04/29/20.  Severe arm swelling  and redness Did it involve swelling of the face/tongue/throat, SOB, or low BP? No Did it involve sudden or severe rash/hives, skin peeling, or any reaction on the inside of your mouth or nose? No Did you need to seek medical attention at a hospital or doctor's office? No When did it last happen?      30 years If all above answers are "NO", may proceed with cephalosporin use.   Reclast [Zoledronic Acid]     "nausea and vomiting. Could not get out of bed.    Consultations: None   Procedures/Studies: ECHOCARDIOGRAM COMPLETE  Result Date: 11/23/2021    ECHOCARDIOGRAM REPORT   Patient Name:   Erin Herman Date of Exam: 11/23/2021 Medical Rec #:  [id number]  Height:       64.0 in Accession #:    [id number]          Weight:       131.6 lb Date of Birth:  1927/06/29           BSA:          1.638 m Patient Age:    88 years            BP:           110/55 mmHg Patient Gender: F                   HR:           63 bpm. [facility] Procedure: 2D Echo, Cardiac Doppler and Color Doppler Indications:    Chest Pain  History:        Patient has prior history of Echocardiogram examinations, most                 recent 07/29/2021. CHF and Cardiomyopathy, CAD and Previous                 Myocardial Infarction, Prior CABG, Arrythmias:LBBB,                 Signs/Symptoms:Chest Pain; Risk Factors:Hypertension, Diabetes                 and Dyslipidemia. There is a 1mm [place] Bioprosthetic valve in                 the Aortic positiom.  Sonographer:    Mikki Harbor Referring Phys: [id number] ASIA B ZIERLE-GHOSH IMPRESSIONS  1. LVEF is depressed with diffuse hypokinesis; septal akinesis. . Left ventricular ejection fraction, by estimation, is 35%%. The left ventricle has moderately decreased function. The left ventricular internal cavity size was mildly dilated. There is mild left ventricular hypertrophy. Left ventricular diastolic parameters are consistent with Grade III diastolic dysfunction  (restrictive).  2. Right ventricular systolic function is mildly reduced. The right ventricular size is moderately enlarged. There is moderately elevated pulmonary artery systolic pressure.  3. Left atrial size was severely dilated.  4. Right atrial size was mild to moderately dilated.  5. MR is more severe than in previous echo report of July 2022. Moderate to severe mitral valve regurgitation.  6. Tricuspid valve regurgitation is mild to moderate.  7. S/p AVR (23 mm toront stentless cadaveric valve in 2006 ) Peak and mean gradients through the valve are 18 and 9 mm Hg respectively AVA (VTI) is 1.53 cm2 Compared to echo from 2022, mean gradient is relatively unchanged . The aortic valve has been repaired/replaced. Aortic valve regurgitation is not visualized. Comparison(s): The left ventricular function is unchanged. FINDINGS  Left Ventricle: LVEF is depressed with diffuse hypokinesis; septal akinesis. Left ventricular ejection fraction, by estimation, is 35%%. The left ventricle has moderately decreased function. The left ventricular internal cavity size was mildly dilated. There is mild left ventricular hypertrophy. Left ventricular diastolic parameters are consistent with Grade III diastolic dysfunction (restrictive). Right Ventricle: The right ventricular size is moderately enlarged. Right vetricular wall thickness was not assessed. Right ventricular systolic function is mildly reduced. There is moderately elevated pulmonary artery systolic pressure. The tricuspid regurgitant velocity is 3.14 m/s, and with an assumed right atrial pressure of 8 mmHg, the estimated right ventricular systolic pressure is 47.4 mmHg. Left Atrium: Left atrial size was severely dilated. Right Atrium: Right atrial size was mild to  moderately dilated. Pericardium: Trivial pericardial effusion is present. Mitral Valve: MR is more severe than in previous echo report of July 2022. There is mild thickening of the mitral valve leaflet(s).  Mild mitral annular calcification. Moderate to severe mitral valve regurgitation. MV peak gradient, 9.1 mmHg. The mean mitral valve gradient is 3.0 mmHg. Tricuspid Valve: The tricuspid valve is normal in structure. Tricuspid valve regurgitation is mild to moderate. Aortic Valve: S/p AVR (23 mm toront stentless cadaveric valve in 2006 ) Peak and mean gradients through the valve are 18 and 9 mm Hg respectively AVA (VTI) is 1.53 cm2 Compared to echo from 2022, mean gradient is relatively unchanged. The aortic valve has been repaired/replaced. Aortic valve regurgitation is not visualized. Aortic valve mean gradient measures 9.0 mmHg. Aortic valve peak gradient measures 18.0 mmHg. Aortic valve area, by VTI measures 1.70 cm. Pulmonic Valve: The pulmonic valve was normal in structure. Pulmonic valve regurgitation is mild to moderate. Aorta: The aortic root is normal in size and structure. IAS/Shunts: No atrial level shunt detected by color flow Doppler.  LEFT VENTRICLE PLAX 2D LVIDd:         5.20 cm     Diastology LVIDs:         3.90 cm     LV e' medial:    4.79 cm/s LV PW:         1.10 cm     LV E/e' medial:  29.0 LV IVS:        1.40 cm     LV e' lateral:   6.42 cm/s LVOT diam:     1.70 cm     LV E/e' lateral: 21.7 LV SV:         80 LV SV Index:   49 LVOT Area:     2.27 cm  LV Volumes (MOD) LV vol d, MOD A2C: 80.4 ml LV vol d, MOD A4C: 68.6 ml LV vol s, MOD A2C: 45.5 ml LV vol s, MOD A4C: 46.2 ml LV SV MOD A2C:     34.9 ml LV SV MOD A4C:     68.6 ml LV SV MOD BP:      26.3 ml RIGHT VENTRICLE RV Basal diam:  4.05 cm RV Mid diam:    3.90 cm RV S prime:     7.72 cm/s TAPSE (M-mode): 2.0 cm LEFT ATRIUM              Index        RIGHT ATRIUM           Index LA diam:        4.90 cm  2.99 cm/m   RA Area:     20.40 cm LA Vol (A2C):   108.0 ml 65.95 ml/m  RA Volume:   61.50 ml  37.55 ml/m LA Vol (A4C):   101.0 ml 61.67 ml/m LA Biplane Vol: 106.0 ml 64.73 ml/m  AORTIC VALVE                     PULMONIC VALVE AV Area (Vmax):     1.69 cm      PV Vmax:       0.72 m/s AV Area (Vmean):   1.70 cm      PV Peak grad:  2.1 mmHg AV Area (VTI):     1.70 cm AV Vmax:           212.00 cm/s AV Vmean:          137.500  cm/s AV VTI:            0.470 m AV Peak Grad:      18.0 mmHg AV Mean Grad:      9.0 mmHg LVOT Vmax:         157.67 cm/s LVOT Vmean:        102.900 cm/s LVOT VTI:          0.351 m LVOT/AV VTI ratio: 0.75  AORTA Ao Root diam: 2.70 cm Ao Asc diam:  3.60 cm MITRAL VALVE                  TRICUSPID VALVE MV Area (PHT): 3.16 cm       TR Peak grad:   39.4 mmHg MV Area VTI:   1.96 cm       TR Vmax:        314.00 cm/s MV Peak grad:  9.1 mmHg MV Mean grad:  3.0 mmHg       SHUNTS MV Vmax:       1.51 m/s       Systemic VTI:  0.35 m MV Vmean:      80.7 cm/s      Systemic Diam: 1.70 cm MV Decel Time: 240 msec MR Peak grad:    80.6 mmHg MR Mean grad:    45.0 mmHg MR Vmax:         449.00 cm/s MR Vmean:        302.0 cm/s MR PISA:         2.65 cm MR PISA Eff ROA: 59 mm MR PISA Radius:  0.65 cm MV E velocity: 139.00 cm/s MV A velocity: 70.70 cm/s MV E/A ratio:  1.97 Dorris Carnes MD Electronically signed by Dorris Carnes MD Signature Date/Time: 11/23/2021/2:53:21 PM    Final    DG Chest 2 View  Result Date: 11/22/2021 CLINICAL DATA:  Chest pain EXAM: CHEST - 2 VIEW COMPARISON:  08/09/2021 FINDINGS: Cardiac shadow is enlarged but stable. Postsurgical changes are noted. Aortic calcifications are seen. The lungs are hyper aerated bilaterally. No focal infiltrate or effusion is seen. No bony abnormality is noted. IMPRESSION: Hyperinflation without acute abnormality. Electronically Signed   By: Inez Catalina M.D.   On: 11/22/2021 21:45     Discharge Exam: Vitals:   11/23/21 1256 11/23/21 1537  BP: (!) 109/59 123/74  Pulse: 63 76  Resp: 17 14  Temp: 98.2 F (36.8 C) 98.2 F (36.8 C)  SpO2: 98% 93%   Vitals:   11/23/21 0500 11/23/21 0813 11/23/21 1256 11/23/21 1537  BP: (!) 96/46 (!) 110/55 (!) 109/59 123/74  Pulse: 64 63 63 76  Resp: 18 15 17  14   Temp: 97.8 F (36.6 C) 97.9 F (36.6 C) 98.2 F (36.8 C) 98.2 F (36.8 C)  TempSrc: Oral Oral Oral Oral  SpO2:  100% 98% 93%  Weight:      Height:        General: Pt is alert, awake, not in acute distress Cardiovascular: RRR, S1/S2 +, no rubs, no gallops Respiratory: CTA bilaterally, no wheezing, no rhonchi Abdominal: Soft, NT, ND, bowel sounds + Extremities: no edema, no cyanosis    The results of significant diagnostics from this hospitalization (including imaging, microbiology, ancillary and laboratory) are listed below for reference.     Microbiology: Recent Results (from the past 240 hour(s))  SARS Coronavirus 2 by RT PCR (hospital order, performed in [facility] hospital lab) *cepheid single result test* Anterior Nasal Swab  Status: None   Collection Time: 11/23/21  1:46 AM   Specimen: Anterior Nasal Swab  Result Value Ref Range Status   SARS Coronavirus 2 by RT PCR NEGATIVE NEGATIVE Final    Comment: Performed at [facility], [address] [place], [place] [address]     Labs: BNP (last 3 results) Recent Labs    07/28/21 2153 08/09/21 0435  BNP 286.0* [id number]*   Basic Metabolic Panel: Recent Labs  Lab 11/22/21 2119 11/23/21 0607  NA 137 139  K 3.3* 4.1  CL 97* 101  CO2 29 30  GLUCOSE 142* 132*  BUN 35* 31*  CREATININE 1.18* 1.12*  CALCIUM 9.3 8.9   Liver Function Tests: Recent Labs  Lab 11/23/21 0607  AST 29  ALT 28  ALKPHOS 72  BILITOT 1.0  PROT 6.2*  ALBUMIN 3.5   No results for input(s): "LIPASE", "AMYLASE" in the last 168 hours. No results for input(s): "AMMONIA" in the last 168 hours. CBC: Recent Labs  Lab 11/22/21 2119 11/23/21 0607  WBC 12.1* 11.7*  NEUTROABS 9.5*  --   HGB 12.1 10.0*  HCT 37.1 30.8*  MCV 96.1 96.6  PLT 276 222   Cardiac Enzymes: No results for input(s): "CKTOTAL", "CKMB", "CKMBINDEX", "TROPONINI" in the last 168 hours. BNP: Invalid input(s): "POCBNP" CBG: No results for input(s): "GLUCAP" in the  last 168 hours. D-Dimer No results for input(s): "DDIMER" in the last 72 hours. Hgb A1c No results for input(s): "HGBA1C" in the last 72 hours. Lipid Profile No results for input(s): "CHOL", "HDL", "LDLCALC", "TRIG", "CHOLHDL", "LDLDIRECT" in the last 72 hours. Thyroid function studies Recent Labs    11/23/21 0608  TSH 1.046   Anemia work up No results for input(s): "VITAMINB12", "FOLATE", "FERRITIN", "TIBC", "IRON", "RETICCTPCT" in the last 72 hours. Urinalysis    Component Value Date/Time   COLORURINE YELLOW 11/23/2021 1330   APPEARANCEUR HAZY (A) 11/23/2021 1330   LABSPEC 1.010 11/23/2021 1330   PHURINE 6.0 11/23/2021 1330   GLUCOSEU NEGATIVE 11/23/2021 1330   HGBUR LARGE (A) 11/23/2021 1330   BILIRUBINUR NEGATIVE 11/23/2021 1330   KETONESUR NEGATIVE 11/23/2021 1330   PROTEINUR NEGATIVE 11/23/2021 1330   UROBILINOGEN 0.2 05/20/2014 0802   NITRITE NEGATIVE 11/23/2021 1330   LEUKOCYTESUR LARGE (A) 11/23/2021 1330   Sepsis Labs Recent Labs  Lab 11/22/21 2119 11/23/21 0607  WBC 12.1* 11.7*   Microbiology Recent Results (from the past 240 hour(s))  SARS Coronavirus 2 by RT PCR ([facility]) *cepheid single result test* Anterior Nasal Swab     Status: None   Collection Time: 11/23/21  1:46 AM   Specimen: Anterior Nasal Swab  Result Value Ref Range Status   SARS Coronavirus 2 by RT PCR NEGATIVE NEGATIVE Final    Comment: Performed at [facility], [address] [place], [place] [address]     Time coordinating discharge: 35 minutes  SIGNED:   Rodena Goldmann, DO [facility] 11/23/2021, 3:58 PM  If 7PM-7AM, please contact night-coverage [email or web address]

## 2021-11-23 NOTE — H&P (Signed)
History and Physical    Patient: Erin Herman WUJ:[id number] DOB: 1927/12/10 DOA: 11/22/2021 DOS: the patient was seen and examined on 11/23/2021 PCP: Kirstie Peri, MD  Patient coming from: Home  Chief Complaint:  Chief Complaint  Patient presents with   Chest Pain   HPI: Erin Herman is a 87 y.o. female with medical history significant of anxiety, coronary artery disease status post CABG and stent, cardiomyopathy, depression, hypertension, GERD, hyperlipidemia, type 2 diabetes mellitus, and more presents ED with a chief complaint of chest pain.  Patient reports the chest pain started between 4 and 5 PM.  She had already been feeling fatigued and not well.  She was up walking around and then she had a pressure develop in her chest.  She reports it was associated with flushing in her face.  She had no nausea, diaphoresis, dizziness, or palpitations.  She felt like she was having a heart attack.  She reports that this feels very similar to when she has had a heart attack in the past.  The pressure did not radiate up into her neck or down into her arm.  Nitro made to get better.  She reports she has been under a great deal of stress at home because her husband is in hospice care.  She does not wear oxygen at home, and is wearing oxygen at the time of my exam.  There is no documented hypoxia, but she reports that it makes her feel better.  Patient has no other complaints at this time.  Patient does not smoke, does not drink, does not do drugs.  She is vaccinated for COVID.  Patient is full code. Review of Systems: As mentioned in the history of present illness. All other systems reviewed and are negative. Past Medical History:  Diagnosis Date   Anemia    iron   Anxiety    Aortic stenosis    Bioprosthetic AVR 2006   CAD (coronary artery disease), native coronary artery    Multivessel status post CABG 2006, occluded SVG to circumflex, DES to circumflex July 2020   Cardiomyopathy  [facility])    Chronic kidney disease    Depression    Dysphagia    Essential hypertension    Family history of adverse reaction to anesthesia    N&V   GERD (gastroesophageal reflux disease)    Headache    History of hiatal hernia    HOH (hard of hearing)    LBBB (left bundle branch block)    Mixed hyperlipidemia    NSTEMI (non-ST elevated myocardial infarction) [facility])    July 2020   Osteoarthritis    Postoperative nausea and vomiting    Morphine   Type 2 diabetes mellitus (HCC)    UTI (lower urinary tract infection)    Past Surgical History:  Procedure Laterality Date   ABDOMINAL HYSTERECTOMY     partial   AORTIC VALVE REPLACEMENT  2006   Dr. Cornelius Moras - 23 mm [place] stentless cadaver valve   CARDIAC CATHETERIZATION  2020   with stents   CHOLECYSTECTOMY     CORONARY ARTERY BYPASS GRAFT  2006    Dr. Cornelius Moras - LIMA to LAD, SVG to circumflex   CORONARY STENT INTERVENTION N/A 11/06/2018   Procedure: CORONARY STENT INTERVENTION;  Surgeon: Yvonne Kendall, MD;  [facility];  Service: Cardiovascular;  Laterality: N/A;   CORONARY/GRAFT ANGIOGRAPHY N/A 11/06/2018   Procedure: CORONARY/GRAFT ANGIOGRAPHY;  Surgeon: Yvonne Kendall, MD;  Location: [facility] IN[facility];  Service: Cardiovascular;  Laterality: N/A;   LEFT HEART CATH AND CORS/GRAFTS ANGIOGRAPHY N/A 11/07/2020   Procedure: LEFT HEART CATH AND CORS/GRAFTS ANGIOGRAPHY;  Surgeon: Kathleene Hazel, MD;  Location: [facility];  Service: Cardiovascular;  Laterality: N/A;   THYROID LOBECTOMY Right    TOTAL HIP ARTHROPLASTY Right 03/01/2017   Procedure: RIGHT TOTAL HIP ARTHROPLASTY ANTERIOR APPROACH;  Surgeon: Durene Romans, MD;  Location: [facility] ORS;  Service: Orthopedics;  Laterality: Right;  70 mins   TOTAL HIP ARTHROPLASTY Left 04/29/2020   Procedure: TOTAL HIP ARTHROPLASTY ANTERIOR APPROACH;  Surgeon: Durene Romans, MD;  Location: [facility];  Service: Orthopedics;  Laterality: Left;  70 mins   TOTAL KNEE ARTHROPLASTY  Right 2006   TOTAL KNEE ARTHROPLASTY Left 05/20/2014   DR Thurston Hole   TOTAL KNEE ARTHROPLASTY Left 05/20/2014   Procedure: LEFT TOTAL KNEE ARTHROPLASTY;  Surgeon: Nilda Simmer, MD;  Location: [facility];  Service: Orthopedics;  Laterality: Left;   Social History:  reports that she has never smoked. She has never used smokeless tobacco. She reports that she does not drink alcohol and does not use drugs.  Allergies  Allergen Reactions   Lipitor [Atorvastatin]     MUSCLE ACHES/FATIGUE   Morphine Nausea And Vomiting   Nitroglycerin Other (See Comments)    Very sensitive, drops BP significantly per pt   Penicillins Swelling and Other (See Comments)    Tolerated Cephalosporin Date: 04/29/20.  Severe arm swelling and redness Did it involve swelling of the face/tongue/throat, SOB, or low BP? No Did it involve sudden or severe rash/hives, skin peeling, or any reaction on the inside of your mouth or nose? No Did you need to seek medical attention at a hospital or doctor's office? No When did it last happen?      30 years If all above answers are "NO", may proceed with cephalosporin use.   Reclast [Zoledronic Acid]     "nausea and vomiting. Could not get out of bed.    Family History  Problem Relation Age of Onset   Heart Problems Mother    Diabetes Father    Cirrhosis Father    Diabetes Other     Prior to Admission medications   Medication Sig Start Date End Date Taking? Authorizing Provider  acetaminophen (TYLENOL) 325 MG tablet Take 2 tablets (650 mg total) by mouth every 6 (six) hours as needed. Patient taking differently: Take 650 mg by mouth every 6 (six) hours as needed for mild pain. 06/18/21   Wallis Bamberg, PA-C  APPLE CIDER VINEGAR PO Take 1 tablet by mouth every morning.    [provider]  aspirin EC 81 MG tablet Take 1 tablet (81 mg total) by mouth daily. Swallow whole. 06/26/20   Jonelle Sidle, MD  Cholecalciferol (VITAMIN D3) 125 MCG (5000 UT) CAPS Take 5,000 Units  by mouth daily.    [provider]  CINNAMON PO Take 1 tablet by mouth 2 (two) times daily.    [provider]  Coenzyme Q10 (COQ10 PO) Take 1 tablet by mouth daily.    [provider]  CRANBERRY PO Take 1 tablet by mouth daily.    [provider]  Cyanocobalamin (B-12) 2500 MCG TABS Take 2,500 mcg by mouth daily.    [provider]  famotidine (PEPCID) 20 MG tablet Take 10 mg by mouth 2 (two) times daily.    [provider]  furosemide (LASIX) 20 MG tablet Take 40mg  in am and 20mg  pm. 07/30/21   ,  Pratik D, DO  isosorbide dinitrate (ISORDIL) 10 MG tablet Take one tablet by mouth two to three times a day. 11/05/21   Jonelle Sidle, MD  levothyroxine (SYNTHROID) 88 MCG tablet Take 88 mcg by mouth daily before breakfast.    [provider]  metFORMIN (GLUCOPHAGE) 500 MG tablet Take 1 tablet (500 mg total) by mouth 2 (two) times daily with a meal. 11/10/18   Mikhail, Nita Sells, DO  metoprolol succinate (TOPROL XL) 25 MG 24 hr tablet Take 1 tablet (25 mg total) by mouth daily. 01/07/21   Netta Neat., NP  Multiple Vitamins-Minerals (HAIR/SKIN/NAILS/BIOTIN) TABS Take 1 tablet by mouth daily.    [provider]  [facility] - [place] ULTRA test strip USE 1 STRIP TO CHECK GLUCOSE ONCE DAILY 07/29/21   [provider]  Polyethyl Glycol-Propyl Glycol (SYSTANE OP) Place 1 drop into both eyes as needed (dry eye).    [provider]  potassium chloride SA (K-DUR,KLOR-CON) 20 MEQ tablet Take 1 tablet (20 mEq total) by mouth daily after supper. 01/26/17   Jonelle Sidle, MD  psyllium (METAMUCIL) 58.6 % packet Take 1 packet by mouth daily.    [provider]  rosuvastatin (CRESTOR) 5 MG tablet Take 1 tablet (5 mg total) by mouth daily. 01/06/21   Netta Neat., NP  sertraline (ZOLOFT) 100 MG tablet Take 100 mg by mouth at bedtime.    [provider]  spironolactone (ALDACTONE) 25 MG tablet Take 0.5 tablets  (12.5 mg total) by mouth daily. 07/06/16   Laqueta Linden, MD  vitamin C (ASCORBIC ACID) 500 MG tablet Take 500 mg by mouth daily.    [provider]  Vitamin E 180 MG (400 UNIT) CAPS Take 400 Units by mouth daily.    [provider]    Physical Exam: Vitals:   11/23/21 0000 11/23/21 0030 11/23/21 0100 11/23/21 0140  BP: 128/61 116/66 120/66 112/89  Pulse: 70 75 78 73  Resp: (!) 21 20 19 14   Temp:    97.9 F (36.6 C)  TempSrc:    Oral  SpO2: 100% 99% 100% 100%  Weight:      Height:       1.  General: Patient lying supine in bed,  no acute distress   2. Psychiatric: Alert and oriented x 3, mood and behavior normal for situation, pleasant and cooperative with exam   3. Neurologic: Speech and language are normal, face is symmetric, moves all 4 extremities voluntarily, at baseline without acute deficits on limited exam   4. HEENMT:  Head is atraumatic, normocephalic, pupils reactive to light, neck is supple, trachea is midline, mucous membranes are moist   5. Respiratory : Lungs are clear to auscultation bilaterally without wheezing, rhonchi, rales, no cyanosis, no increase in work of breathing or accessory muscle use, 2L [place] in place   6. Cardiovascular : Heart rate normal, rhythm is regular, no murmurs, rubs or gallops, trace peripheral edema, peripheral pulses palpated   7. Gastrointestinal:  Abdomen is soft, nondistended, nontender to palpation bowel sounds active, no masses or organomegaly palpated   8. Skin:  Skin is warm, dry and intact without rashes, acute lesions, or ulcers on limited exam   9.Musculoskeletal:  No acute deformities or trauma, no asymmetry in tone,  peripheral pulses palpated, no tenderness to palpation in the extremities  Data Reviewed: In the ED Temp 97.8, heart rate 67-79, respiratory 15-23, blood pressure 102/51-131/79, satting at 99% on 2 L nasal cannula  Leukocytosis 12.1, hemoglobin 12.1, platelets 276 Chemistry panel  reveals a hypokalemia 3.3 Chest x-ray shows hyperinflation without acute abnormality EKG shows a heart rate of 82, sinus rhythm, prolonged QTc at 530, but no ST elevation Troponin 8, 8 Nitro given and relieve chest pain Admission requested for chest pain work-up  Assessment and Plan: * Chest pain -EKG without acute ischemic changes -Started with exertion -better with nitro -Echo in the AM -Trop normal x 2 -Increase Imdur -Monitor on tele   Leukocytosis -WBC 12 -Most likely 2/2 stress reaction -CXR is negative for acute infiltrate -Covid pending -UA pending  Hypothyroidism -check TSH -Continue synthroid  Depression -Continue zoloft  GERD (gastroesophageal reflux disease) -Continue pepcid  Chronic diastolic heart failure (HCC) -Continue asa, statin, beta blocker, aldactone  Essential hypertension, benign -Continue metoprolol   Hyperlipidemia -Continue statin       Advance Care Planning:   Code Status: Full Code   Consults: none  Family Communication: no family at bedside  Severity of Illness: The appropriate patient status for this patient is OBSERVATION. Observation status is judged to be reasonable and necessary in order to provide the required intensity of service to ensure the patient's safety. The patient's presenting symptoms, physical exam findings, and initial radiographic and laboratory data in the context of their medical condition is felt to place them at decreased risk for further clinical deterioration. Furthermore, it is anticipated that the patient will be medically stable for discharge from the hospital within 2 midnights of admission.   Author: Lilyan Gilford, DO 11/23/2021 2:26 AM  For on call review [email or web address].

## 2021-11-23 NOTE — Assessment & Plan Note (Signed)
Continue metoprolol. 

## 2021-11-23 NOTE — Progress Notes (Signed)
SATURATION QUALIFICATIONS: (This note is used to comply with regulatory documentation for home oxygen)  Patient Saturations on Room Air at Rest = 95%  Patient Saturations on Room Air while Ambulating = 93%  Please briefly explain why patient needs home oxygen: Patient ambulated in the hallway, reported no complaints of SOB. MD Sherryll Burger made aware.

## 2021-11-23 NOTE — ED Notes (Signed)
Hospitalist at bedside 

## 2021-11-23 NOTE — Assessment & Plan Note (Signed)
Continue statin. 

## 2021-12-01 ENCOUNTER — Telehealth: Payer: Self-pay | Admitting: Cardiology

## 2021-12-01 MED ORDER — ROSUVASTATIN CALCIUM 5 MG PO TABS: 5.0000 mg | ORAL_TABLET | Freq: Every day | ORAL | 1 refills | Status: AC

## 2021-12-01 NOTE — Telephone Encounter (Signed)
Done

## 2021-12-01 NOTE — Telephone Encounter (Signed)
*  STAT* If patient is at the pharmacy, call can be transferred to refill team.   1. Which medications need to be refilled? (please list name of each medication and dose if known) rosuvastatin (CRESTOR) 5 MG tablet  2. Which pharmacy/location (including street and city if local pharmacy) is medication to be sent to? [facility] - [place], [place] - [address]  3. Do they need a 30 day or 90 day supply? 90

## 2021-12-14 ENCOUNTER — Encounter: Payer: Self-pay | Admitting: Cardiology

## 2021-12-14 ENCOUNTER — Ambulatory Visit: Payer: Medicare Other | Admitting: Cardiology

## 2021-12-14 VITALS — BP 106/56 | HR 71 | Ht 64.0 in | Wt 129.6 lb

## 2021-12-14 DIAGNOSIS — Z953 Presence of xenogenic heart valve: Secondary | ICD-10-CM | POA: Diagnosis not present

## 2021-12-14 DIAGNOSIS — Z952 Presence of prosthetic heart valve: Secondary | ICD-10-CM

## 2021-12-14 DIAGNOSIS — I340 Nonrheumatic mitral (valve) insufficiency: Secondary | ICD-10-CM | POA: Diagnosis not present

## 2021-12-14 DIAGNOSIS — I25119 Atherosclerotic heart disease of native coronary artery with unspecified angina pectoris: Secondary | ICD-10-CM | POA: Diagnosis not present

## 2021-12-14 NOTE — Patient Instructions (Addendum)
Medication Instructions:   Your physician recommends that you continue on your current medications as directed. Please refer to the Current Medication list given to you today.  Labwork:  none  Testing/Procedures:  none  Follow-Up:  Your physician recommends that you schedule a follow-up appointment in: 3 months.  Any Other Special Instructions Will Be Listed Below (If Applicable).  If you need a refill on your cardiac medications before your next appointment, please call your pharmacy. 

## 2021-12-14 NOTE — Progress Notes (Signed)
Cardiology Office Note  Date: 12/14/2021   ID: Erin Herman, DOB 11-30-1927, MRN [id number]  PCP:  Monico Blitz, MD  Cardiologist:  Rozann Lesches, MD Electrophysiologist:  None   Chief Complaint  Patient presents with   Cardiac follow-up    History of Present Illness: Erin Herman is a 88 y.o. female last seen in May.  She was hospitalized in July with chest discomfort, no ACS by cardiac enzymes.  Long-acting nitrates were uptitrated at that time.  She is here with her daughter for follow-up.  Fortunately, tolerating dose adjustment in Isordil.  Follow-up echocardiogram in July revealed stable LVEF at approximately 35% with restrictive diastolic filling pattern, mildly reduced RV contraction with moderate pulmonary hypertension, severe left atrial enlargement, and moderate to severe mitral regurgitation.  Bioprosthetic AVR was stable with mean gradient 9 mmHg.  I reviewed her medications today.  Do not anticipate any adjustments at this time.  In terms of GDMT, she is not a good candidate for SGLT2 inhibitors with history of recurrent UTIs.  Past Medical History:  Diagnosis Date   Anemia    iron   Anxiety    Aortic stenosis    Bioprosthetic AVR 2006   CAD (coronary artery disease), native coronary artery    Multivessel status post CABG 2006, occluded SVG to circumflex, DES to circumflex July 2020   Cardiomyopathy [facility])    Chronic kidney disease    Depression    Dysphagia    Essential hypertension    Family history of adverse reaction to anesthesia    N&V   GERD (gastroesophageal reflux disease)    Headache    History of hiatal hernia    HOH (hard of hearing)    LBBB (left bundle branch block)    Mixed hyperlipidemia    NSTEMI (non-ST elevated myocardial infarction) [facility])    July 2020   Osteoarthritis    Postoperative nausea and vomiting    Morphine   Type 2 diabetes mellitus ([place])    UTI (lower urinary tract infection)     Past Surgical  History:  Procedure Laterality Date   ABDOMINAL HYSTERECTOMY     partial   AORTIC VALVE REPLACEMENT  2006   Dr. Roxy Manns - 23 mm [place] stentless cadaver valve   CARDIAC CATHETERIZATION  2020   with stents   CHOLECYSTECTOMY     CORONARY ARTERY BYPASS GRAFT  2006    Dr. Roxy Manns - LIMA to LAD, SVG to circumflex   CORONARY STENT INTERVENTION N/A 11/06/2018   Procedure: CORONARY STENT INTERVENTION;  Surgeon: Nelva Bush, MD;  Location: [place] CV LAB;  Service: Cardiovascular;  Laterality: N/A;   CORONARY/GRAFT ANGIOGRAPHY N/A 11/06/2018   Procedure: CORONARY/GRAFT ANGIOGRAPHY;  Surgeon: Nelva Bush, MD;  Location: [place] CV LAB;  Service: Cardiovascular;  Laterality: N/A;   LEFT HEART CATH AND CORS/GRAFTS ANGIOGRAPHY N/A 11/07/2020   Procedure: LEFT HEART CATH AND CORS/GRAFTS ANGIOGRAPHY;  Surgeon: Burnell Blanks, MD;  Location: [place] CV LAB;  Service: Cardiovascular;  Laterality: N/A;   THYROID LOBECTOMY Right    TOTAL HIP ARTHROPLASTY Right 03/01/2017   Procedure: RIGHT TOTAL HIP ARTHROPLASTY ANTERIOR APPROACH;  Surgeon: Paralee Cancel, MD;  Location: [facility];  Service: Orthopedics;  Laterality: Right;  70 mins   TOTAL HIP ARTHROPLASTY Left 04/29/2020   Procedure: TOTAL HIP ARTHROPLASTY ANTERIOR APPROACH;  Surgeon: Paralee Cancel, MD;  Location: [facility];  Service: Orthopedics;  Laterality: Left;  70 mins   TOTAL KNEE ARTHROPLASTY Right 2006  TOTAL KNEE ARTHROPLASTY Left 05/20/2014   DR Noemi Chapel   TOTAL KNEE ARTHROPLASTY Left 05/20/2014   Procedure: LEFT TOTAL KNEE ARTHROPLASTY;  Surgeon: Lorn Junes, MD;  Location: [place];  Service: Orthopedics;  Laterality: Left;    Current Outpatient Medications  Medication Sig Dispense Refill   acetaminophen (TYLENOL) 325 MG tablet Take 2 tablets (650 mg total) by mouth every 6 (six) hours as needed. (Patient taking differently: Take 650 mg by mouth every 6 (six) hours as needed for mild pain.) 30 tablet 0   APPLE CIDER VINEGAR PO  Take 1 tablet by mouth every morning.     aspirin EC 81 MG tablet Take 1 tablet (81 mg total) by mouth daily. Swallow whole. 90 tablet 3   Cholecalciferol (VITAMIN D3) 125 MCG (5000 UT) CAPS Take 5,000 Units by mouth daily.     CINNAMON PO Take 1 tablet by mouth 2 (two) times daily.     Coenzyme Q10 (COQ10 PO) Take 1 tablet by mouth daily.     CRANBERRY PO Take 1 tablet by mouth daily.     Cyanocobalamin (B-12) 2500 MCG TABS Take 2,500 mcg by mouth daily.     famotidine (PEPCID) 20 MG tablet Take 10 mg by mouth 2 (two) times daily.     furosemide (LASIX) 40 MG tablet Take 40-80 mg by mouth daily as needed for fluid.     isosorbide dinitrate (ISORDIL) 20 MG tablet Take 1 tablet (20 mg total) by mouth 2 (two) times daily. 60 tablet 0   levothyroxine (SYNTHROID) 88 MCG tablet Take 88 mcg by mouth daily before breakfast.     metFORMIN (GLUCOPHAGE) 500 MG tablet Take 1 tablet (500 mg total) by mouth 2 (two) times daily with a meal.     metoprolol succinate (TOPROL XL) 25 MG 24 hr tablet Take 1 tablet (25 mg total) by mouth daily. 90 tablet 3   Multiple Vitamins-Minerals (HAIR/SKIN/NAILS/BIOTIN) TABS Take 1 tablet by mouth daily.     ONETOU[place] ULTRA test strip USE 1 STRIP TO CHECK GLUCOSE ONCE DAILY     Polyethyl Glycol-Propyl Glycol (SYSTANE OP) Place 1 drop into both eyes as needed (dry eye).     potassium chloride SA (K-DUR,KLOR-CON) 20 MEQ tablet Take 1 tablet (20 mEq total) by mouth daily after supper. 90 tablet 0   psyllium (METAMUCIL) 58.6 % packet Take 1 packet by mouth daily.     rosuvastatin (CRESTOR) 5 MG tablet Take 1 tablet (5 mg total) by mouth daily. 90 tablet 1   sertraline (ZOLOFT) 50 MG tablet Take 50 mg by mouth daily.     spironolactone (ALDACTONE) 25 MG tablet Take 0.5 tablets (12.5 mg total) by mouth daily. 45 tablet 3   vitamin C (ASCORBIC ACID) 500 MG tablet Take 500 mg by mouth daily.     Vitamin E 180 MG (400 UNIT) CAPS Take 400 Units by mouth daily.     No current  facility-administered medications for this visit.   Allergies:  Lipitor [atorvastatin], Morphine, Nitroglycerin, Penicillins, and Reclast [zoledronic acid]   ROS: No orthopnea or PND.  Intermittent ankle edema.  Physical Exam: VS:  BP (!) 106/56   Pulse 71   Ht 5\' 4"  (1.626 m)   Wt 129 lb 9.6 oz (58.8 kg)   LMP  (LMP Unknown)   SpO2 96%   BMI 22.25 kg/m , BMI Body mass index is 22.25 kg/m.  Wt Readings from Last 3 Encounters:  12/14/21 129 lb 9.6 oz (58.8 kg)  11/23/21 131 lb 9.8 oz (59.7 kg)  09/02/21 133 lb 6.4 oz (60.5 kg)    General: Patient appears comfortable at rest. HEENT: Conjunctiva and lids normal. Neck: Supple, no elevated JVP or carotid bruits, no thyromegaly. Lungs: Clear to auscultation, nonlabored breathing at rest. Cardiac: Regular rate and rhythm, no S3, 1/6 systolic murmur. Extremities: Trace ankle edema.  ECG:  An ECG dated 11/22/2021 was personally reviewed today and demonstrated:  Sinus rhythm with IVCD of left bundle branch block type.  Recent Labwork: 07/30/2021: Magnesium 2.2 08/09/2021: B Natriuretic Peptide 581.0 11/23/2021: ALT 28; AST 29; BUN 31; Creatinine, Ser 1.12; Hemoglobin 10.0; Platelets 222; Potassium 4.1; Sodium 139; TSH 1.046     Component Value Date/Time   CHOL 139 07/29/2021 0014   TRIG 93 07/29/2021 0014   HDL 40 (L) 07/29/2021 0014   CHOLHDL 3.5 07/29/2021 0014   VLDL 19 07/29/2021 0014   LDLCALC 80 07/29/2021 0014    Other Studies Reviewed Today:  Chest x-ray 11/22/2021: FINDINGS: Cardiac shadow is enlarged but stable. Postsurgical changes are noted. Aortic calcifications are seen. The lungs are hyper aerated bilaterally. No focal infiltrate or effusion is seen. No bony abnormality is noted.   IMPRESSION: Hyperinflation without acute abnormality.  Echocardiogram 11/23/2021:  1. LVEF is depressed with diffuse hypokinesis; septal akinesis. . Left  ventricular ejection fraction, by estimation, is 35%%. The left ventricle   has moderately decreased function. The left ventricular internal cavity  size was mildly dilated. There is  mild left ventricular hypertrophy. Left ventricular diastolic parameters  are consistent with Grade III diastolic dysfunction (restrictive).   2. Right ventricular systolic function is mildly reduced. The right  ventricular size is moderately enlarged. There is moderately elevated  pulmonary artery systolic pressure.   3. Left atrial size was severely dilated.   4. Right atrial size was mild to moderately dilated.   5. MR is more severe than in previous echo report of July 2022. Moderate  to severe mitral valve regurgitation.   6. Tricuspid valve regurgitation is mild to moderate.   7. S/p AVR (23 mm [place] stentless cadaveric valve in 2006 ) Peak and  mean gradients through the valve are 18 and 9 mm Hg respectively AVA (VTI)  is 1.53 cm2 Compared to echo from 2022, mean gradient is relatively  unchanged . The aortic valve has been  repaired/replaced. Aortic valve regurgitation is not visualized.   Assessment and Plan:  1.  Multivessel CAD status post CABG with graft disease and for PCI options.  Plan to continue medical therapy.  Fortunately, she is tolerating up titration of Isordil and otherwise continues on aspirin, Toprol-XL, and Crestor.  No changes were made today.  2.  HFrEF with ischemic cardiomyopathy and LVEF approximately 35%.  Also grade 3 diastolic dysfunction.  Current regimen includes Toprol-XL, Aldactone, and Lasix with potassium supplement.  Relatively low blood pressure has limited other options such as ARB or Entresto.  Also not a good candidate for SGLT2 inhibitor with history of recurrent UTIs.  3.  Aortic valve disease status post bioprosthetic AVR.  Mean gradient 9 mmHg by most recent echocardiogram.  4.  Moderate to severe mitral regurgitation, plan conservative management.  Medication Adjustments/Labs and Tests Ordered: Current medicines are reviewed at  length with the patient today.  Concerns regarding medicines are outlined above.   Tests Ordered: No orders of the defined types were placed in this encounter.   Medication Changes: No orders of the defined types were placed in this  encounter.   Disposition:  Follow up  3 months.  Signed, Jonelle Sidle, MD, [facility] 12/14/2021 4:12 PM    [facility] [address] [place], [place], [place] [address] Phone: [phone]; Fax: [phone]

## 2021-12-22 ENCOUNTER — Telehealth: Payer: Self-pay | Admitting: Cardiology

## 2021-12-22 MED ORDER — ISOSORBIDE DINITRATE 20 MG PO TABS: 20.0000 mg | ORAL_TABLET | Freq: Two times a day (BID) | ORAL | 3 refills | Status: AC

## 2021-12-22 NOTE — Telephone Encounter (Signed)
*  STAT* If patient is at the pharmacy, call can be transferred to refill team.   1. Which medications need to be refilled? (please list name of each medication and dose if known)   isosorbide dinitrate (ISORDIL) 20 MG tablet    2. Which pharmacy/location (including street and city if local pharmacy) is medication to be sent to? [facility]) [facility] - [place], [place] - [address]  3. Do they need a 30 day or 90 day supply?  90 day

## 2021-12-22 NOTE — Telephone Encounter (Signed)
Done

## 2022-01-14 ENCOUNTER — Other Ambulatory Visit: Payer: Self-pay | Admitting: *Deleted

## 2022-01-14 MED ORDER — METOPROLOL SUCCINATE ER 25 MG PO TB24: 25.0000 mg | ORAL_TABLET | Freq: Every day | ORAL | 3 refills | Status: AC

## 2022-03-10 ENCOUNTER — Emergency Department (HOSPITAL_COMMUNITY): Payer: Medicare Other

## 2022-03-10 ENCOUNTER — Encounter (HOSPITAL_COMMUNITY): Payer: Self-pay

## 2022-03-10 ENCOUNTER — Inpatient Hospital Stay (HOSPITAL_COMMUNITY)
Admission: EM | Admit: 2022-03-10 | Discharge: 2022-03-30 | DRG: 640 | Disposition: A | Discharge status: Home Health | Payer: Medicare Other | Attending: Internal Medicine | Admitting: Internal Medicine

## 2022-03-10 ENCOUNTER — Other Ambulatory Visit: Payer: Self-pay

## 2022-03-10 DIAGNOSIS — Z515 Encounter for palliative care: Secondary | ICD-10-CM

## 2022-03-10 DIAGNOSIS — E11649 Type 2 diabetes mellitus with hypoglycemia without coma: Secondary | ICD-10-CM | POA: Diagnosis not present

## 2022-03-10 DIAGNOSIS — K219 Gastro-esophageal reflux disease without esophagitis: Secondary | ICD-10-CM | POA: Diagnosis present

## 2022-03-10 DIAGNOSIS — Z711 Person with feared health complaint in whom no diagnosis is made: Secondary | ICD-10-CM | POA: Diagnosis not present

## 2022-03-10 DIAGNOSIS — R110 Nausea: Secondary | ICD-10-CM | POA: Diagnosis not present

## 2022-03-10 DIAGNOSIS — N183 Chronic kidney disease, stage 3 unspecified: Secondary | ICD-10-CM | POA: Diagnosis present

## 2022-03-10 DIAGNOSIS — K7460 Unspecified cirrhosis of liver: Secondary | ICD-10-CM | POA: Diagnosis present

## 2022-03-10 DIAGNOSIS — E039 Hypothyroidism, unspecified: Secondary | ICD-10-CM | POA: Diagnosis present

## 2022-03-10 DIAGNOSIS — Z833 Family history of diabetes mellitus: Secondary | ICD-10-CM

## 2022-03-10 DIAGNOSIS — Z789 Other specified health status: Secondary | ICD-10-CM | POA: Diagnosis not present

## 2022-03-10 DIAGNOSIS — G4700 Insomnia, unspecified: Secondary | ICD-10-CM | POA: Diagnosis present

## 2022-03-10 DIAGNOSIS — K7682 Hepatic encephalopathy: Secondary | ICD-10-CM | POA: Diagnosis not present

## 2022-03-10 DIAGNOSIS — E876 Hypokalemia: Secondary | ICD-10-CM | POA: Diagnosis present

## 2022-03-10 DIAGNOSIS — E119 Type 2 diabetes mellitus without complications: Secondary | ICD-10-CM

## 2022-03-10 DIAGNOSIS — E8809 Other disorders of plasma-protein metabolism, not elsewhere classified: Secondary | ICD-10-CM | POA: Diagnosis present

## 2022-03-10 DIAGNOSIS — Z79890 Hormone replacement therapy (postmenopausal): Secondary | ICD-10-CM

## 2022-03-10 DIAGNOSIS — K5900 Constipation, unspecified: Secondary | ICD-10-CM | POA: Diagnosis not present

## 2022-03-10 DIAGNOSIS — Z9049 Acquired absence of other specified parts of digestive tract: Secondary | ICD-10-CM

## 2022-03-10 DIAGNOSIS — T43225A Adverse effect of selective serotonin reuptake inhibitors, initial encounter: Secondary | ICD-10-CM | POA: Diagnosis present

## 2022-03-10 DIAGNOSIS — R54 Age-related physical debility: Secondary | ICD-10-CM | POA: Diagnosis present

## 2022-03-10 DIAGNOSIS — R7401 Elevation of levels of liver transaminase levels: Secondary | ICD-10-CM | POA: Diagnosis not present

## 2022-03-10 DIAGNOSIS — I700 Atherosclerosis of aorta: Secondary | ICD-10-CM | POA: Diagnosis present

## 2022-03-10 DIAGNOSIS — I4892 Unspecified atrial flutter: Secondary | ICD-10-CM | POA: Diagnosis present

## 2022-03-10 DIAGNOSIS — N390 Urinary tract infection, site not specified: Secondary | ICD-10-CM | POA: Diagnosis present

## 2022-03-10 DIAGNOSIS — I959 Hypotension, unspecified: Secondary | ICD-10-CM | POA: Diagnosis present

## 2022-03-10 DIAGNOSIS — F410 Panic disorder [episodic paroxysmal anxiety] without agoraphobia: Secondary | ICD-10-CM | POA: Diagnosis present

## 2022-03-10 DIAGNOSIS — E43 Unspecified severe protein-calorie malnutrition: Secondary | ICD-10-CM | POA: Diagnosis not present

## 2022-03-10 DIAGNOSIS — R9431 Abnormal electrocardiogram [ECG] [EKG]: Secondary | ICD-10-CM | POA: Diagnosis not present

## 2022-03-10 DIAGNOSIS — E871 Hypo-osmolality and hyponatremia: Secondary | ICD-10-CM | POA: Diagnosis not present

## 2022-03-10 DIAGNOSIS — E1165 Type 2 diabetes mellitus with hyperglycemia: Secondary | ICD-10-CM | POA: Diagnosis present

## 2022-03-10 DIAGNOSIS — I10 Essential (primary) hypertension: Secondary | ICD-10-CM | POA: Diagnosis not present

## 2022-03-10 DIAGNOSIS — Y92009 Unspecified place in unspecified non-institutional (private) residence as the place of occurrence of the external cause: Secondary | ICD-10-CM | POA: Diagnosis not present

## 2022-03-10 DIAGNOSIS — Z90711 Acquired absence of uterus with remaining cervical stump: Secondary | ICD-10-CM

## 2022-03-10 DIAGNOSIS — E669 Obesity, unspecified: Secondary | ICD-10-CM | POA: Diagnosis present

## 2022-03-10 DIAGNOSIS — E1122 Type 2 diabetes mellitus with diabetic chronic kidney disease: Secondary | ICD-10-CM | POA: Diagnosis present

## 2022-03-10 DIAGNOSIS — E782 Mixed hyperlipidemia: Secondary | ICD-10-CM | POA: Diagnosis present

## 2022-03-10 DIAGNOSIS — I083 Combined rheumatic disorders of mitral, aortic and tricuspid valves: Secondary | ICD-10-CM | POA: Diagnosis present

## 2022-03-10 DIAGNOSIS — I2510 Atherosclerotic heart disease of native coronary artery without angina pectoris: Secondary | ICD-10-CM | POA: Diagnosis present

## 2022-03-10 DIAGNOSIS — I5082 Biventricular heart failure: Secondary | ICD-10-CM | POA: Diagnosis present

## 2022-03-10 DIAGNOSIS — I130 Hypertensive heart and chronic kidney disease with heart failure and stage 1 through stage 4 chronic kidney disease, or unspecified chronic kidney disease: Secondary | ICD-10-CM | POA: Diagnosis not present

## 2022-03-10 DIAGNOSIS — D631 Anemia in chronic kidney disease: Secondary | ICD-10-CM | POA: Diagnosis present

## 2022-03-10 DIAGNOSIS — I5042 Chronic combined systolic (congestive) and diastolic (congestive) heart failure: Secondary | ICD-10-CM | POA: Diagnosis present

## 2022-03-10 DIAGNOSIS — R627 Adult failure to thrive: Secondary | ICD-10-CM | POA: Diagnosis present

## 2022-03-10 DIAGNOSIS — F32A Depression, unspecified: Secondary | ICD-10-CM | POA: Diagnosis present

## 2022-03-10 DIAGNOSIS — Z953 Presence of xenogenic heart valve: Secondary | ICD-10-CM

## 2022-03-10 DIAGNOSIS — E875 Hyperkalemia: Secondary | ICD-10-CM | POA: Diagnosis present

## 2022-03-10 DIAGNOSIS — D689 Coagulation defect, unspecified: Secondary | ICD-10-CM | POA: Diagnosis present

## 2022-03-10 DIAGNOSIS — K5730 Diverticulosis of large intestine without perforation or abscess without bleeding: Secondary | ICD-10-CM | POA: Diagnosis present

## 2022-03-10 DIAGNOSIS — I5040 Unspecified combined systolic (congestive) and diastolic (congestive) heart failure: Secondary | ICD-10-CM | POA: Diagnosis not present

## 2022-03-10 DIAGNOSIS — F419 Anxiety disorder, unspecified: Secondary | ICD-10-CM | POA: Diagnosis not present

## 2022-03-10 DIAGNOSIS — Z951 Presence of aortocoronary bypass graft: Secondary | ICD-10-CM

## 2022-03-10 DIAGNOSIS — Z888 Allergy status to other drugs, medicaments and biological substances status: Secondary | ICD-10-CM

## 2022-03-10 DIAGNOSIS — Z96653 Presence of artificial knee joint, bilateral: Secondary | ICD-10-CM | POA: Diagnosis present

## 2022-03-10 DIAGNOSIS — R000 Tachycardia, unspecified: Secondary | ICD-10-CM | POA: Diagnosis present

## 2022-03-10 DIAGNOSIS — N179 Acute kidney failure, unspecified: Secondary | ICD-10-CM | POA: Diagnosis not present

## 2022-03-10 DIAGNOSIS — I447 Left bundle-branch block, unspecified: Secondary | ICD-10-CM | POA: Diagnosis present

## 2022-03-10 DIAGNOSIS — R1310 Dysphagia, unspecified: Secondary | ICD-10-CM | POA: Diagnosis present

## 2022-03-10 DIAGNOSIS — Z7982 Long term (current) use of aspirin: Secondary | ICD-10-CM

## 2022-03-10 DIAGNOSIS — K30 Functional dyspepsia: Secondary | ICD-10-CM | POA: Diagnosis present

## 2022-03-10 DIAGNOSIS — Z96643 Presence of artificial hip joint, bilateral: Secondary | ICD-10-CM | POA: Diagnosis present

## 2022-03-10 DIAGNOSIS — I25110 Atherosclerotic heart disease of native coronary artery with unstable angina pectoris: Secondary | ICD-10-CM

## 2022-03-10 DIAGNOSIS — Z6826 Body mass index (BMI) 26.0-26.9, adult: Secondary | ICD-10-CM

## 2022-03-10 DIAGNOSIS — Z7189 Other specified counseling: Secondary | ICD-10-CM | POA: Diagnosis not present

## 2022-03-10 DIAGNOSIS — Z66 Do not resuscitate: Secondary | ICD-10-CM | POA: Diagnosis not present

## 2022-03-10 DIAGNOSIS — H9190 Unspecified hearing loss, unspecified ear: Secondary | ICD-10-CM | POA: Diagnosis present

## 2022-03-10 DIAGNOSIS — Z79899 Other long term (current) drug therapy: Secondary | ICD-10-CM

## 2022-03-10 DIAGNOSIS — Z885 Allergy status to narcotic agent status: Secondary | ICD-10-CM

## 2022-03-10 DIAGNOSIS — I252 Old myocardial infarction: Secondary | ICD-10-CM

## 2022-03-10 DIAGNOSIS — Z880 Allergy status to penicillin: Secondary | ICD-10-CM

## 2022-03-10 DIAGNOSIS — E860 Dehydration: Secondary | ICD-10-CM | POA: Diagnosis present

## 2022-03-10 DIAGNOSIS — Z7984 Long term (current) use of oral hypoglycemic drugs: Secondary | ICD-10-CM

## 2022-03-10 LAB — CBC WITH DIFFERENTIAL/PLATELET
Abs Immature Granulocytes: 0.02 10*3/uL (ref 0.00–0.07)
Basophils Absolute: 0 10*3/uL (ref 0.0–0.1)
Basophils Relative: 0 %
Eosinophils Absolute: 0 10*3/uL (ref 0.0–0.5)
Eosinophils Relative: 0 %
HCT: 36 % (ref 36.0–46.0)
Hemoglobin: 12.7 g/dL (ref 12.0–15.0)
Immature Granulocytes: 0 %
Lymphocytes Relative: 15 %
Lymphs Abs: 1.1 10*3/uL (ref 0.7–4.0)
MCH: 32.3 pg (ref 26.0–34.0)
MCHC: 35.3 g/dL (ref 30.0–36.0)
MCV: 91.6 fL (ref 80.0–100.0)
Monocytes Absolute: 0.6 10*3/uL (ref 0.1–1.0)
Monocytes Relative: 9 %
Neutro Abs: 5.2 10*3/uL (ref 1.7–7.7)
Neutrophils Relative %: 76 %
Platelets: 263 10*3/uL (ref 150–400)
RBC: 3.93 MIL/uL (ref 3.87–5.11)
RDW: 12.9 % (ref 11.5–15.5)
WBC: 7 10*3/uL (ref 4.0–10.5)
nRBC: 0 % (ref 0.0–0.2)

## 2022-03-10 LAB — URINALYSIS, ROUTINE W REFLEX MICROSCOPIC
Bacteria, UA: NONE SEEN
Bilirubin Urine: NEGATIVE
Glucose, UA: NEGATIVE mg/dL
Ketones, ur: NEGATIVE mg/dL
Nitrite: NEGATIVE
Protein, ur: NEGATIVE mg/dL
Specific Gravity, Urine: 1.004 — ABNORMAL LOW (ref 1.005–1.030)
WBC, UA: >50 WBC/hpf — ABNORMAL HIGH (ref 0–5)
pH: 7 (ref 5.0–8.0)

## 2022-03-10 LAB — COMPREHENSIVE METABOLIC PANEL
ALT: 23 U/L (ref 0–44)
AST: 34 U/L (ref 15–41)
Albumin: 4.1 g/dL (ref 3.5–5.0)
Alkaline Phosphatase: 74 U/L (ref 38–126)
Anion gap: 13 (ref 5–15)
BUN: 19 mg/dL (ref 8–23)
CO2: 37 mmol/L — ABNORMAL HIGH (ref 22–32)
Calcium: 8.6 mg/dL — ABNORMAL LOW (ref 8.9–10.3)
Chloride: 69 mmol/L — ABNORMAL LOW (ref 98–111)
Creatinine, Ser: 1.17 mg/dL — ABNORMAL HIGH (ref 0.44–1.00)
GFR, Estimated: 44 mL/min — ABNORMAL LOW (ref >60)
Glucose, Bld: 110 mg/dL — ABNORMAL HIGH (ref 70–99)
Potassium: 2.2 mmol/L — CL (ref 3.5–5.1)
Sodium: 119 mmol/L — CL (ref 135–145)
Total Bilirubin: 0.8 mg/dL (ref 0.3–1.2)
Total Protein: 7 g/dL (ref 6.5–8.1)

## 2022-03-10 LAB — LIPASE, BLOOD: Lipase: 40 U/L (ref 11–51)

## 2022-03-10 LAB — GLUCOSE, CAPILLARY: Glucose-Capillary: 227 mg/dL — ABNORMAL HIGH (ref 70–99)

## 2022-03-10 LAB — MAGNESIUM: Magnesium: 1.6 mg/dL — ABNORMAL LOW (ref 1.7–2.4)

## 2022-03-10 LAB — TSH: TSH: 4.512 u[IU]/mL — ABNORMAL HIGH (ref 0.350–4.500)

## 2022-03-10 LAB — NA AND K (SODIUM & POTASSIUM), RAND UR
Potassium Urine: 12 mmol/L
Sodium, Ur: 11 mmol/L

## 2022-03-10 MED ORDER — LEVOTHYROXINE SODIUM 88 MCG PO TABS
88.0000 ug | ORAL_TABLET | Freq: Every day | ORAL | Status: DC
  Administered 2022-03-11 – 2022-03-15 (×5): 88 ug via ORAL
  Filled 2022-03-10 (×5): qty 1

## 2022-03-10 MED ORDER — POTASSIUM CHLORIDE 10 MEQ/100ML IV SOLN
10.0000 meq | Freq: Once | INTRAVENOUS | Status: AC
  Administered 2022-03-10: 10 meq via INTRAVENOUS
  Filled 2022-03-10: qty 100

## 2022-03-10 MED ORDER — PROMETHAZINE HCL 12.5 MG PO TABS
12.5000 mg | ORAL_TABLET | Freq: Three times a day (TID) | ORAL | Status: DC | PRN
  Administered 2022-03-17 – 2022-03-22 (×4): 12.5 mg via ORAL
  Filled 2022-03-10 (×4): qty 1

## 2022-03-10 MED ORDER — POLYETHYLENE GLYCOL 3350 17 G PO PACK
17.0000 g | PACK | Freq: Every day | ORAL | Status: DC | PRN
  Administered 2022-03-24: 17 g via ORAL
  Filled 2022-03-10: qty 1

## 2022-03-10 MED ORDER — ASPIRIN 81 MG PO TBEC
81.0000 mg | DELAYED_RELEASE_TABLET | Freq: Every day | ORAL | Status: DC
  Administered 2022-03-11 – 2022-03-30 (×19): 81 mg via ORAL
  Filled 2022-03-10 (×20): qty 1

## 2022-03-10 MED ORDER — METOCLOPRAMIDE HCL 5 MG/ML IJ SOLN
10.0000 mg | Freq: Once | INTRAMUSCULAR | Status: AC
  Administered 2022-03-10: 10 mg via INTRAVENOUS
  Filled 2022-03-10: qty 2

## 2022-03-10 MED ORDER — POTASSIUM CHLORIDE 10 MEQ/100ML IV SOLN
10.0000 meq | INTRAVENOUS | Status: AC
  Administered 2022-03-10 (×3): 10 meq via INTRAVENOUS
  Filled 2022-03-10 (×3): qty 100

## 2022-03-10 MED ORDER — ISOSORBIDE DINITRATE 20 MG PO TABS
20.0000 mg | ORAL_TABLET | Freq: Two times a day (BID) | ORAL | Status: DC
  Administered 2022-03-10 – 2022-03-12 (×4): 20 mg via ORAL
  Filled 2022-03-10 (×4): qty 1

## 2022-03-10 MED ORDER — METOPROLOL SUCCINATE ER 25 MG PO TB24
25.0000 mg | ORAL_TABLET | Freq: Every day | ORAL | Status: DC
  Administered 2022-03-10 – 2022-03-14 (×5): 25 mg via ORAL
  Filled 2022-03-10 (×5): qty 1

## 2022-03-10 MED ORDER — SODIUM CHLORIDE 0.9 % IV BOLUS
1000.0000 mL | Freq: Once | INTRAVENOUS | Status: AC
  Administered 2022-03-10: 1000 mL via INTRAVENOUS

## 2022-03-10 MED ORDER — ENOXAPARIN SODIUM 30 MG/0.3ML IJ SOSY
30.0000 mg | PREFILLED_SYRINGE | INTRAMUSCULAR | Status: DC
  Administered 2022-03-10 – 2022-03-12 (×3): 30 mg via SUBCUTANEOUS
  Filled 2022-03-10 (×3): qty 0.3

## 2022-03-10 MED ORDER — ACETAMINOPHEN 325 MG PO TABS
650.0000 mg | ORAL_TABLET | Freq: Four times a day (QID) | ORAL | Status: DC | PRN
  Administered 2022-03-13 – 2022-03-23 (×18): 650 mg via ORAL
  Filled 2022-03-10 (×18): qty 2

## 2022-03-10 MED ORDER — POTASSIUM CHLORIDE IN NACL 40-0.9 MEQ/L-% IV SOLN
INTRAVENOUS | Status: DC
  Administered 2022-03-11: 50 mL/h via INTRAVENOUS

## 2022-03-10 MED ORDER — INSULIN ASPART 100 UNIT/ML IJ SOLN
0.0000 [IU] | Freq: Every day | INTRAMUSCULAR | Status: DC
  Administered 2022-03-10: 2 [IU] via SUBCUTANEOUS

## 2022-03-10 MED ORDER — ONDANSETRON HCL 4 MG/2ML IJ SOLN
4.0000 mg | Freq: Once | INTRAMUSCULAR | Status: AC
  Administered 2022-03-10: 4 mg via INTRAVENOUS
  Filled 2022-03-10: qty 2

## 2022-03-10 MED ORDER — POTASSIUM CHLORIDE CRYS ER 20 MEQ PO TBCR
40.0000 meq | EXTENDED_RELEASE_TABLET | ORAL | Status: AC
  Administered 2022-03-10 – 2022-03-11 (×2): 40 meq via ORAL
  Filled 2022-03-10 (×2): qty 2

## 2022-03-10 MED ORDER — IOHEXOL 300 MG/ML  SOLN
75.0000 mL | Freq: Once | INTRAMUSCULAR | Status: AC | PRN
  Administered 2022-03-10: 75 mL via INTRAVENOUS

## 2022-03-10 MED ORDER — INSULIN ASPART 100 UNIT/ML IJ SOLN
0.0000 [IU] | Freq: Three times a day (TID) | INTRAMUSCULAR | Status: DC
  Administered 2022-03-11 (×2): 2 [IU] via SUBCUTANEOUS
  Administered 2022-03-12: 3 [IU] via SUBCUTANEOUS
  Administered 2022-03-12: 2 [IU] via SUBCUTANEOUS
  Administered 2022-03-12: 1 [IU] via SUBCUTANEOUS
  Administered 2022-03-13: 2 [IU] via SUBCUTANEOUS
  Administered 2022-03-13: 3 [IU] via SUBCUTANEOUS
  Administered 2022-03-13 – 2022-03-14 (×2): 2 [IU] via SUBCUTANEOUS
  Administered 2022-03-14 (×2): 1 [IU] via SUBCUTANEOUS
  Administered 2022-03-15: 2 [IU] via SUBCUTANEOUS
  Administered 2022-03-15 – 2022-03-16 (×2): 1 [IU] via SUBCUTANEOUS
  Administered 2022-03-16: 2 [IU] via SUBCUTANEOUS
  Administered 2022-03-17: 1 [IU] via SUBCUTANEOUS
  Administered 2022-03-17: 2 [IU] via SUBCUTANEOUS
  Administered 2022-03-17: 1 [IU] via SUBCUTANEOUS
  Administered 2022-03-18 (×2): 2 [IU] via SUBCUTANEOUS
  Administered 2022-03-19: 1 [IU] via SUBCUTANEOUS
  Administered 2022-03-19: 2 [IU] via SUBCUTANEOUS
  Administered 2022-03-19 – 2022-03-20 (×2): 1 [IU] via SUBCUTANEOUS
  Administered 2022-03-20: 2 [IU] via SUBCUTANEOUS
  Administered 2022-03-20 – 2022-03-21 (×3): 1 [IU] via SUBCUTANEOUS
  Administered 2022-03-21 – 2022-03-22 (×2): 2 [IU] via SUBCUTANEOUS
  Administered 2022-03-22: 1 [IU] via SUBCUTANEOUS
  Administered 2022-03-23: 3 [IU] via SUBCUTANEOUS
  Administered 2022-03-23 (×2): 1 [IU] via SUBCUTANEOUS
  Administered 2022-03-24 – 2022-03-25 (×4): 2 [IU] via SUBCUTANEOUS
  Administered 2022-03-25 – 2022-03-26 (×3): 1 [IU] via SUBCUTANEOUS
  Administered 2022-03-27: 2 [IU] via SUBCUTANEOUS
  Administered 2022-03-27 – 2022-03-28 (×2): 1 [IU] via SUBCUTANEOUS
  Administered 2022-03-28: 2 [IU] via SUBCUTANEOUS
  Administered 2022-03-29: 1 [IU] via SUBCUTANEOUS
  Administered 2022-03-29: 2 [IU] via SUBCUTANEOUS
  Administered 2022-03-30: 1 [IU] via SUBCUTANEOUS

## 2022-03-10 MED ORDER — SODIUM CHLORIDE 0.9 % IV BOLUS
500.0000 mL | Freq: Once | INTRAVENOUS | Status: AC
  Administered 2022-03-10: 500 mL via INTRAVENOUS

## 2022-03-10 MED ORDER — MAGNESIUM SULFATE 2 GM/50ML IV SOLN
2.0000 g | Freq: Once | INTRAVENOUS | Status: AC
  Administered 2022-03-10: 2 g via INTRAVENOUS
  Filled 2022-03-10: qty 50

## 2022-03-10 MED ORDER — ACETAMINOPHEN 650 MG RE SUPP: 650.0000 mg | Freq: Four times a day (QID) | RECTAL | Status: DC | PRN

## 2022-03-10 NOTE — Assessment & Plan Note (Addendum)
Prolonged at 531, chronic.  -Replete lytes- k and mag -Check EKG in the morning -Hold sertraline, and Cipro for now

## 2022-03-10 NOTE — Assessment & Plan Note (Signed)
Potassium 2.2.  Likely from diuretics, poor oral intake.  Magnesium 1.6. -Replete

## 2022-03-10 NOTE — H&P (Signed)
History and Physical    Erin Herman GUY:[id number] DOB: 1928/04/11 DOA: 03/10/2022  PCP: Kirstie Peri, MD   Patient coming from: Home  I have personally briefly reviewed patient's old medical records in [facility]  Chief Complaint: Nausea  HPI: Erin Herman is a 88 y.o. female with medical history significant for systolic and diastolic CHF, CABG, left bundle branch block, diabetes mellitus, hypertension. Patient presented to the ED with complaints of nausea ongoing for about 5 days now.  1 episode of vomiting at onset of nausea.  Over the past 5 days, reports mild confusion over the past 5 days, that patient is taking longer to respond to questions and has been having some gait abnormality.  No headaches.  No loss of consciousness or seizures.  At baseline has no significant memory problems. No diarrhea, no abdominal pain. Daughter reports patient drinks a good amount of water, but cannot quantify exactly.  She takes Lasix 40 mg twice daily.  She has chronic poor oral intake. Patient intermittently has difficulty voiding, but she denies any pain with urination or other urinary symptoms.  She was diagnosed with UTI 2 days ago and is completing a 3 day course of ciprofloxacin, last dose tonight.  ED Course: Stable vitals.  Sodium 119.  Potassium 2.2.  Magnesium 1.6.  UA with positive leukocytes.  CT abdomen and pelvis shows markedly distended urinary bladder, without other acute abnormality. 1.5 L bolus given.  Hospitalist admit for hyponatremia and hypokalemia  Review of Systems: As per HPI all other systems reviewed and negative.  Past Medical History:  Diagnosis Date   Anemia    iron   Anxiety    Aortic stenosis    Bioprosthetic AVR 2006   CAD (coronary artery disease), native coronary artery    Multivessel status post CABG 2006, occluded SVG to circumflex, DES to circumflex July 2020   Cardiomyopathy [facility])    Chronic kidney disease    Depression    Dysphagia     Essential hypertension    Family history of adverse reaction to anesthesia    N&V   GERD (gastroesophageal reflux disease)    Headache    History of hiatal hernia    HOH (hard of hearing)    LBBB (left bundle branch block)    Mixed hyperlipidemia    NSTEMI (non-ST elevated myocardial infarction) [facility])    July 2020   Osteoarthritis    Postoperative nausea and vomiting    Morphine   Type 2 diabetes mellitus (HCC)    UTI (lower urinary tract infection)     Past Surgical History:  Procedure Laterality Date   ABDOMINAL HYSTERECTOMY     partial   AORTIC VALVE REPLACEMENT  2006   Dr. Cornelius Moras - 23 mm [place] stentless cadaver valve   CARDIAC CATHETERIZATION  2020   with stents   CHOLECYSTECTOMY     CORONARY ARTERY BYPASS GRAFT  2006    Dr. Cornelius Moras - LIMA to LAD, SVG to circumflex   CORONARY STENT INTERVENTION N/A 11/06/2018   Procedure: CORONARY STENT INTERVENTION;  Surgeon: Yvonne Kendall, MD;  Location: [facility] INVASIVE CV LAB;  Service: Cardiovascular;  Laterality: N/A;   CORONARY/GRAFT ANGIOGRAPHY N/A 11/06/2018   Procedure: CORONARY/GRAFT ANGIOGRAPHY;  Surgeon: Yvonne Kendall, MD;  Location: [facility] INVASIVE CV LAB;  Service: Cardiovascular;  Laterality: N/A;   LEFT HEART CATH AND CORS/GRAFTS ANGIOGRAPHY N/A 11/07/2020   Procedure: LEFT HEART CATH AND CORS/GRAFTS ANGIOGRAPHY;  Surgeon: Kathleene Hazel, MD;  Location: [facility] INVASIVE CV  LAB;  Service: Cardiovascular;  Laterality: N/A;   THYROID LOBECTOMY Right    TOTAL HIP ARTHROPLASTY Right 03/01/2017   Procedure: RIGHT TOTAL HIP ARTHROPLASTY ANTERIOR APPROACH;  Surgeon: Durene Romans, MD;  Location: [facility] ORS;  Service: Orthopedics;  Laterality: Right;  70 mins   TOTAL HIP ARTHROPLASTY Left 04/29/2020   Procedure: TOTAL HIP ARTHROPLASTY ANTERIOR APPROACH;  Surgeon: Durene Romans, MD;  Location: [facility] ORS;  Service: Orthopedics;  Laterality: Left;  70 mins   TOTAL KNEE ARTHROPLASTY Right 2006   TOTAL KNEE ARTHROPLASTY Left 05/20/2014   DR  Thurston Hole   TOTAL KNEE ARTHROPLASTY Left 05/20/2014   Procedure: LEFT TOTAL KNEE ARTHROPLASTY;  Surgeon: Nilda Simmer, MD;  Location[facility];  Service: Orthopedics;  Laterality: Left;     reports that she has never smoked. She has never used smokeless tobacco. She reports that she does not drink alcohol and does not use drugs.  Allergies  Allergen Reactions   Lipitor [Atorvastatin]     MUSCLE ACHES/FATIGUE   Morphine Nausea And Vomiting   Nitroglycerin Other (See Comments)    Very sensitive, drops BP significantly per pt   Penicillins Swelling and Other (See Comments)    Tolerated Cephalosporin Date: 04/29/20.  Severe arm swelling and redness Did it involve swelling of the face/tongue/throat, SOB, or low BP? No Did it involve sudden or severe rash/hives, skin peeling, or any reaction on the inside of your mouth or nose? No Did you need to seek medical attention at a hospital or doctor's office? No When did it last happen?      30 years If all above answers are "NO", may proceed with cephalosporin use.   Reclast [Zoledronic Acid]     "nausea and vomiting. Could not get out of bed.    Family History  Problem Relation Age of Onset   Heart Problems Mother    Diabetes Father    Cirrhosis Father    Diabetes Other    Prior to Admission medications   Medication Sig Start Date End Date Taking? Authorizing Provider  aspirin EC 81 MG tablet Take 1 tablet (81 mg total) by mouth daily. Swallow whole. 06/26/20  Yes Jonelle Sidle, MD  Cholecalciferol (VITAMIN D3) 125 MCG (5000 UT) CAPS Take 5,000 Units by mouth daily.   Yes [provider]  CINNAMON PO Take 1 tablet by mouth 2 (two) times daily.   Yes [provider]  ciprofloxacin (CIPRO) 500 MG tablet Take 500 mg by mouth 2 (two) times daily. 03/08/22  Yes [provider]  Coenzyme Q10 (COQ10 PO) Take 1 tablet by mouth daily.   Yes [provider]  famotidine (PEPCID) 20 MG tablet Take 10 mg by mouth 2  (two) times daily.   Yes [provider]  furosemide (LASIX) 40 MG tablet Take 40 mg by mouth 2 (two) times daily. 08/26/21  Yes [provider]  isosorbide dinitrate (ISORDIL) 20 MG tablet Take 1 tablet (20 mg total) by mouth 2 (two) times daily. 12/22/21  Yes Jonelle Sidle, MD  levothyroxine (SYNTHROID) 88 MCG tablet Take 88 mcg by mouth daily before breakfast.   Yes [provider]  metFORMIN (GLUCOPHAGE) 500 MG tablet Take 1 tablet (500 mg total) by mouth 2 (two) times daily with a meal. 11/10/18  Yes Mikhail, [place], DO  metoprolol succinate (TOPROL XL) 25 MG 24 hr tablet Take 1 tablet (25 mg total) by mouth daily. 01/14/22  Yes Jonelle Sidle, MD  Polyethyl Glycol-Propyl Glycol (  SYSTANE OP) Place 1 drop into both eyes as needed (dry eye).   Yes [provider]  potassium chloride SA (K-DUR,KLOR-CON) 20 MEQ tablet Take 1 tablet (20 mEq total) by mouth daily after supper. 01/26/17  Yes Jonelle SidleMcDowell, Samuel G, MD  psyllium (METAMUCIL) 58.6 % packet Take 1 packet by mouth daily.   Yes [provider]  rosuvastatin (CRESTOR) 5 MG tablet Take 1 tablet (5 mg total) by mouth daily. 12/01/21  Yes Jonelle SidleMcDowell, Samuel G, MD  sertraline (ZOLOFT) 50 MG tablet Take 50 mg by mouth daily. 09/13/21  Yes [provider]  spironolactone (ALDACTONE) 25 MG tablet Take 0.5 tablets (12.5 mg total) by mouth daily. 07/06/16  Yes Laqueta LindenKoneswaran, Suresh A, MD  vitamin C (ASCORBIC ACID) 500 MG tablet Take 500 mg by mouth daily.   Yes [provider]  Vitamin E 180 MG (400 UNIT) CAPS Take 400 Units by mouth daily.   Yes [provider]  acetaminophen (TYLENOL) 325 MG tablet Take 2 tablets (650 mg total) by mouth every 6 (six) hours as needed. Patient taking differently: Take 650 mg by mouth every 6 (six) hours as needed for mild pain. 06/18/21   Wallis BambergMani, Mario, PA-C  ONETOUCH ULTRA test strip USE 1 STRIP TO CHECK GLUCOSE ONCE DAILY 07/29/21   [provider]     Physical Exam: Vitals:   03/10/22 1630 03/10/22 1700 03/10/22 1730 03/10/22 1841  BP: 134/70 135/68 127/66 (!) 123/59  Pulse: 72 74 72 74  Resp: 17 16 15 16   Temp:    97.9 F (36.6 C)  TempSrc:    Oral  SpO2: 98% 99% 97% 99%  Weight:    57.8 kg  Height:    5\' 4"  (1.626 m)    Constitutional: NAD, calm, comfortable Vitals:   03/10/22 1630 03/10/22 1700 03/10/22 1730 03/10/22 1841  BP: 134/70 135/68 127/66 (!) 123/59  Pulse: 72 74 72 74  Resp: 17 16 15 16   Temp:    97.9 F (36.6 C)  TempSrc:    Oral  SpO2: 98% 99% 97% 99%  Weight:    57.8 kg  Height:    5\' 4"  (1.626 m)   Eyes: PERRL, lids and conjunctivae normal ENMT: Mucous membranes are moist.   Neck: normal, supple, no masses, no thyromegaly Respiratory: clear to auscultation bilaterally, no wheezing, no crackles.  Cardiovascular: Regular rate and rhythm, no murmurs / rubs / gallops. No extremity edema.  Extremities warm Abdomen: no tenderness, no masses palpated. No hepatosplenomegaly. Bowel sounds positive.  Musculoskeletal: no clubbing / cyanosis. No joint deformity upper and lower extremities.  Skin: no rashes, lesions, ulcers. No induration Neurologic: No apparent cranial nerve abnormality normal extremities spontaneously.  Psychiatric: Normal judgment and insight. Alert and oriented x 3. Normal mood.   Labs on Admission: I have personally reviewed following labs and imaging studies  CBC: Recent Labs  Lab 03/10/22 1430  WBC 7.0  NEUTROABS 5.2  HGB 12.7  HCT 36.0  MCV 91.6  PLT 263   Basic Metabolic Panel: Recent Labs  Lab 03/10/22 1430  NA 119*  K 2.2*  CL 69*  CO2 37*  GLUCOSE 110*  BUN 19  CREATININE 1.17*  CALCIUM 8.6*  MG 1.6*   GFR: Estimated Creatinine Clearance: 26.5 mL/min (A) (by C-G formula based on SCr of 1.17 mg/dL (H)). Liver Function Tests: Recent Labs  Lab 03/10/22 1430  AST 34  ALT 23  ALKPHOS 74  BILITOT 0.8  PROT 7.0  ALBUMIN 4.1  Recent Labs  Lab  03/10/22 1430  LIPASE 40   Urine analysis:    Component Value Date/Time   COLORURINE YELLOW 03/10/2022 1430   APPEARANCEUR HAZY (A) 03/10/2022 1430   LABSPEC 1.004 (L) 03/10/2022 1430   PHURINE 7.0 03/10/2022 1430   GLUCOSEU NEGATIVE 03/10/2022 1430   HGBUR SMALL (A) 03/10/2022 1430   BILIRUBINUR NEGATIVE 03/10/2022 1430   KETONESUR NEGATIVE 03/10/2022 1430   PROTEINUR NEGATIVE 03/10/2022 1430   UROBILINOGEN 0.2 05/20/2014 0802   NITRITE NEGATIVE 03/10/2022 1430   LEUKOCYTESUR LARGE (A) 03/10/2022 1430    Radiological Exams on Admission: CT ABDOMEN PELVIS W CONTRAST  Result Date: 03/10/2022 CLINICAL DATA:  Nausea for 5 days EXAM: CT ABDOMEN AND PELVIS WITH CONTRAST TECHNIQUE: Multidetector CT imaging of the abdomen and pelvis was performed using the standard protocol following bolus administration of intravenous contrast. RADIATION DOSE REDUCTION: This exam was performed according to the departmental dose-optimization program which includes automated exposure control, adjustment of the mA and/or kV according to patient size and/or use of iterative reconstruction technique. CONTRAST:  42mL OMNIPAQUE IOHEXOL 300 MG/ML  SOLN COMPARISON:  None Available. FINDINGS: Lower chest: The heart is enlarged without pericardial effusion. Extensive atherosclerosis of the aorta and native coronary vessels. There are small bilateral pleural effusions, right greater than left, with minimal dependent lower lobe atelectasis. Hepatobiliary: Cholecystectomy. Liver is grossly unremarkable with no biliary duct dilation. Pancreas: Diffuse pancreatic atrophy. No inflammatory changes or pancreatic duct dilation. Spleen: Normal in size without focal abnormality. Adrenals/Urinary Tract: There is marked distension of the urinary bladder, with limited evaluation of the bladder base due to streak artifact from bilateral hip arthroplasties. No obvious filling defects. There is mild bilateral renal cortical thinning, with  grossly normal enhancement. No urinary tract calculi or obstruction. The adrenals are unremarkable. Stomach/Bowel: No bowel obstruction or ileus. The appendix is not identified. No bowel wall thickening or inflammatory change. Diverticulosis of the distal colon without diverticulitis. Vascular/Lymphatic: Aortic atherosclerosis. No enlarged abdominal or pelvic lymph nodes. Reproductive: Status post hysterectomy. No adnexal masses. Other: No free fluid or free intraperitoneal gas. No abdominal wall hernia. Musculoskeletal: Bilateral hip arthroplasties. No acute or destructive bony lesions. Reconstructed images demonstrate no additional findings. IMPRESSION: 1. Markedly distended urinary bladder. If patient is unable to void, catheter decompression could be considered. 2. Colonic diverticulosis without diverticulitis. 3. No bowel obstruction or ileus. 4. Small bilateral pleural effusions, right greater than left. 5. Cardiomegaly. 6. Aortic Atherosclerosis (ICD10-I70.0). Coronary artery atherosclerosis. Electronically Signed   By: Sharlet Salina M.D.   On: 03/10/2022 16:47    EKG: Independently reviewed.  Edema regular, P waves intermittently.  Seen in lead V1. Read as atrial flutter by EKG device.  Assessment/Plan Principal Problem:   Hyponatremia Active Problems:   Essential hypertension, benign   CAD, NATIVE VESSEL   Diabetes (HCC)   Depression   Hypokalemia   Prolonged QT interval   Combined systolic and diastolic congestive heart failure (HCC)   Assessment and Plan: * Hyponatremia Sodium 119.  3 months ago sodium was 139.  Baseline ~mid 130s.  Symptomatic with nausea, mild confusion, gait abnormality.  So far etiology may be from poor oral intake and diuretics concomitant hypokalemia. -1.5 L bolus given in ED, continue with gentle hydration with N/s +40 kcl @ 50 cc/hr for now considering systolic CHF -Follow-up urine sodium, urine osmolality, serum osmolality   Combined systolic and diastolic  congestive heart failure (HCC) Stable and compensated.  Last echo 10/2021, EF of 35%, with  grade 3 DD- restrictive. -Hold Lasix for now with hyponatremia  Prolonged QT interval Prolonged at 531, chronic.  -Replete lytes- k and mag -Check EKG in the morning -Hold sertraline, and Cipro for now  Hypokalemia Potassium 2.2.  Likely from diuretics, poor oral intake.  Magnesium 1.6. -Replete  Diabetes (HCC) - HgbA1c - SSI- S -Hold metformin  CAD, NATIVE VESSEL History of CABG 2006 and subsequent stent placement 2020.  EKG showing regular rhythm, read as atrial flutter.   In the setting of hypokalemia and hypomagnesemia.  Follows with cardiologist Dr. Diona Browner, no documentation of prior atrial fibrillation or flutter.. -Repeat EKG in the morning -Continue aspirin  Essential hypertension, benign Stable. -Resume Imdur, metoprolol -Hold Lasix, spironolactone for now   DVT prophylaxis: Lovenox Code Status: Full code, discussed with both patient and daughter at bedside, they are unsure.  Assume full code for now Family Communication: Daughter [employer], and present at bedside. Disposition Plan: ~ 2 days Consults called: None Admission status: Inpatient, stepdown I certify that at the point of admission it is my clinical judgment that the patient will require inpatient hospital care spanning beyond 2 midnights from the point of admission due to high intensity of service, high risk for further deterioration and high frequency of surveillance required.   Author: Onnie Boer, MD 03/10/2022 8:03 PM  For on call review [email or web address].

## 2022-03-10 NOTE — Assessment & Plan Note (Signed)
Stable. -Resume Imdur, metoprolol -Hold Lasix, spironolactone for now

## 2022-03-10 NOTE — Assessment & Plan Note (Signed)
-   HgbA1c - SSI- S -Hold metformin

## 2022-03-10 NOTE — ED Notes (Signed)
ED TO INPATIENT HANDOFF REPORT  ED Nurse Name and Phone #:   S Name/Age/Gender Erin Herman 88 y.o. female Room/Bed: APA19/APA19  Code Status   Code Status: Prior  Home/SNF/Other Home Patient oriented to: self, place, time, and situation Is this baseline? Yes   Triage Complete: Triage complete  Chief Complaint Hyponatremia [E87.1]  Triage Note Pt nausea x5 days. Pt on Cipro for UTI. Took Cipro/Zofran this morning. Helped prevent vomiting but pt still nauseous.    Allergies Allergies  Allergen Reactions   Lipitor [Atorvastatin]     MUSCLE ACHES/FATIGUE   Morphine Nausea And Vomiting   Nitroglycerin Other (See Comments)    Very sensitive, drops BP significantly per pt   Penicillins Swelling and Other (See Comments)    Tolerated Cephalosporin Date: 04/29/20.  Severe arm swelling and redness Did it involve swelling of the face/tongue/throat, SOB, or low BP? No Did it involve sudden or severe rash/hives, skin peeling, or any reaction on the inside of your mouth or nose? No Did you need to seek medical attention at a hospital or doctor's office? No When did it last happen?      30 years If all above answers are "NO", may proceed with cephalosporin use.   Reclast [Zoledronic Acid]     "nausea and vomiting. Could not get out of bed.    Level of Care/Admitting Diagnosis ED Disposition     ED Disposition  Admit   Condition  --   Comment  Hospital Area: [facility] [[id number]]  Level of Care: Stepdown [14]  Covid Evaluation: Asymptomatic - no recent exposure (last 10 days) testing not required  Diagnosis: Hyponatremia [027253]  Admitting Physician: Onnie Boer [[id number]]  Attending Physician: Onnie Boer [phone]  Certification:: I certify this patient will need inpatient services for at least 2 midnights  Estimated Length of Stay: 2          B Medical/Surgery History Past Medical History:  Diagnosis Date   Anemia    iron    Anxiety    Aortic stenosis    Bioprosthetic AVR 2006   CAD (coronary artery disease), native coronary artery    Multivessel status post CABG 2006, occluded SVG to circumflex, DES to circumflex July 2020   Cardiomyopathy [facility])    Chronic kidney disease    Depression    Dysphagia    Essential hypertension    Family history of adverse reaction to anesthesia    N&V   GERD (gastroesophageal reflux disease)    Headache    History of hiatal hernia    HOH (hard of hearing)    LBBB (left bundle branch block)    Mixed hyperlipidemia    NSTEMI (non-ST elevated myocardial infarction) [facility])    July 2020   Osteoarthritis    Postoperative nausea and vomiting    Morphine   Type 2 diabetes mellitus (HCC)    UTI (lower urinary tract infection)    Past Surgical History:  Procedure Laterality Date   ABDOMINAL HYSTERECTOMY     partial   AORTIC VALVE REPLACEMENT  2006   Dr. Cornelius Moras - 23 mm [place] stentless cadaver valve   CARDIAC CATHETERIZATION  2020   with stents   CHOLECYSTECTOMY     CORONARY ARTERY BYPASS GRAFT  2006    Dr. Cornelius Moras - LIMA to LAD, SVG to circumflex   CORONARY STENT INTERVENTION N/A 11/06/2018   Procedure: CORONARY STENT INTERVENTION;  Surgeon: Yvonne Kendall, MD;  Location: [facility] INVASIVE CV LAB;  Service: Cardiovascular;  Laterality: N/A;   CORONARY/GRAFT ANGIOGRAPHY N/A 11/06/2018   Procedure: CORONARY/GRAFT ANGIOGRAPHY;  Surgeon: Yvonne Kendall, MD;  Location: [facility] INVASIVE CV LAB;  Service: Cardiovascular;  Laterality: N/A;   LEFT HEART CATH AND CORS/GRAFTS ANGIOGRAPHY N/A 11/07/2020   Procedure: LEFT HEART CATH AND CORS/GRAFTS ANGIOGRAPHY;  Surgeon: Kathleene Hazel, MD;  Location: [facility] INVASIVE CV LAB;  Service: Cardiovascular;  Laterality: N/A;   THYROID LOBECTOMY Right    TOTAL HIP ARTHROPLASTY Right 03/01/2017   Procedure: RIGHT TOTAL HIP ARTHROPLASTY ANTERIOR APPROACH;  Surgeon: Durene Romans, MD;  Location: [facility] ORS;  Service: Orthopedics;  Laterality: Right;  70 mins    TOTAL HIP ARTHROPLASTY Left 04/29/2020   Procedure: TOTAL HIP ARTHROPLASTY ANTERIOR APPROACH;  Surgeon: Durene Romans, MD;  Location: [facility] ORS;  Service: Orthopedics;  Laterality: Left;  70 mins   TOTAL KNEE ARTHROPLASTY Right 2006   TOTAL KNEE ARTHROPLASTY Left 05/20/2014   DR Thurston Hole   TOTAL KNEE ARTHROPLASTY Left 05/20/2014   Procedure: LEFT TOTAL KNEE ARTHROPLASTY;  Surgeon: Nilda Simmer, MD;  Location: [facility] OR;  Service: Orthopedics;  Laterality: Left;     A IV Location/Drains/Wounds Patient Lines/Drains/Airways Status     Active Line/Drains/Airways     Name Placement date Placement time Site Days   Peripheral IV 03/10/22 20 G 1" Right Antecubital 03/10/22  1517  Antecubital  less than 1            Intake/Output Last 24 hours No intake or output data in the 24 hours ending 03/10/22 1756  Labs/Imaging Results for orders placed or performed during the hospital encounter of 03/10/22 (from the past 48 hour(s))  CBC with Differential     Status: None   Collection Time: 03/10/22  2:30 PM  Result Value Ref Range   WBC 7.0 4.0 - 10.5 K/uL   RBC 3.93 3.87 - 5.11 MIL/uL   Hemoglobin 12.7 12.0 - 15.0 g/dL   HCT 83.1 51.7 - 61.6 %   MCV 91.6 80.0 - 100.0 fL   MCH 32.3 26.0 - 34.0 pg   MCHC 35.3 30.0 - 36.0 g/dL   RDW 07.3 71.0 - 62.6 %   Platelets 263 150 - 400 K/uL   nRBC 0.0 0.0 - 0.2 %   Neutrophils Relative % 76 %   Neutro Abs 5.2 1.7 - 7.7 K/uL   Lymphocytes Relative 15 %   Lymphs Abs 1.1 0.7 - 4.0 K/uL   Monocytes Relative 9 %   Monocytes Absolute 0.6 0.1 - 1.0 K/uL   Eosinophils Relative 0 %   Eosinophils Absolute 0.0 0.0 - 0.5 K/uL   Basophils Relative 0 %   Basophils Absolute 0.0 0.0 - 0.1 K/uL   Immature Granulocytes 0 %   Abs Immature Granulocytes 0.02 0.00 - 0.07 K/uL    Comment: Performed at [facility], [address] [place], [place] [address]  Comprehensive metabolic panel     Status: Abnormal   Collection Time: 03/10/22  2:30 PM  Result Value Ref  Range   Sodium 119 (LL) 135 - 145 mmol/L    Comment: CRITICAL RESULT CALLED TO, READ BACK BY AND VERIFIED WITH: Ugochi Henzler,B ON 03/10/22 AT 1610 BY LOY,C    Potassium 2.2 (LL) 3.5 - 5.1 mmol/L    Comment: CRITICAL RESULT CALLED TO, READ BACK BY AND VERIFIED WITH: Meshilem Machuca,B ON 03/10/22 AT 1610 BY LOY,C    Chloride 69 (L) 98 - 111 mmol/L   CO2 37 (H) 22 - 32 mmol/L  Glucose, Bld 110 (H) 70 - 99 mg/dL    Comment: Glucose reference range applies only to samples taken after fasting for at least 8 hours.   BUN 19 8 - 23 mg/dL   Creatinine, Ser 1.02 (H) 0.44 - 1.00 mg/dL   Calcium 8.6 (L) 8.9 - 10.3 mg/dL   Total Protein 7.0 6.5 - 8.1 g/dL   Albumin 4.1 3.5 - 5.0 g/dL   AST 34 15 - 41 U/L   ALT 23 0 - 44 U/L   Alkaline Phosphatase 74 38 - 126 U/L   Total Bilirubin 0.8 0.3 - 1.2 mg/dL   GFR, Estimated 44 (L) >60 mL/min    Comment: (NOTE) Calculated using the CKD-EPI Creatinine Equation (2021)    Anion gap 13 5 - 15    Comment: Performed at [facility] [address], [place], [place] [address]  Lipase, blood     Status: None   Collection Time: 03/10/22  2:30 PM  Result Value Ref Range   Lipase 40 11 - 51 U/L    Comment: Performed at [facility], [address], [place], [place] [address]  Urinalysis, Routine w reflex microscopic Urine, Clean Catch     Status: Abnormal   Collection Time: 03/10/22  2:30 PM  Result Value Ref Range   Color, Urine YELLOW YELLOW   APPearance HAZY (A) CLEAR   Specific Gravity, Urine 1.004 (L) 1.005 - 1.030   pH 7.0 5.0 - 8.0   Glucose, UA NEGATIVE NEGATIVE mg/dL   Hgb urine dipstick SMALL (A) NEGATIVE   Bilirubin Urine NEGATIVE NEGATIVE   Ketones, ur NEGATIVE NEGATIVE mg/dL   Protein, ur NEGATIVE NEGATIVE mg/dL   Nitrite NEGATIVE NEGATIVE   Leukocytes,Ua LARGE (A) NEGATIVE   RBC / HPF 0-5 0 - 5 RBC/hpf   WBC, UA >50 (H) 0 - 5 WBC/hpf   Bacteria, UA NONE SEEN NONE SEEN   WBC Clumps PRESENT     Comment: Performed at [facility], [address], [place], [place] [address]  Magnesium     Status: Abnormal   Collection Time: 03/10/22  2:30 PM  Result Value Ref Range   Magnesium 1.6 (L) 1.7 - 2.4 mg/dL    Comment: Performed at [facility], [address], [place], [place] [address]   CT ABDOMEN PELVIS W CONTRAST  Result Date: 03/10/2022 CLINICAL DATA:  Nausea for 5 days EXAM: CT ABDOMEN AND PELVIS WITH CONTRAST TECHNIQUE: Multidetector CT imaging of the abdomen and pelvis was performed using the standard protocol following bolus administration of intravenous contrast. RADIATION DOSE REDUCTION: This exam was performed according to the departmental dose-optimization program which includes automated exposure control, adjustment of the mA and/or kV according to patient size and/or use of iterative reconstruction technique. CONTRAST:  53mL OMNIPAQUE IOHEXOL 300 MG/ML  SOLN COMPARISON:  None Available. FINDINGS: Lower chest: The heart is enlarged without pericardial effusion. Extensive atherosclerosis of the aorta and native coronary vessels. There are small bilateral pleural effusions, right greater than left, with minimal dependent lower lobe atelectasis. Hepatobiliary: Cholecystectomy. Liver is grossly unremarkable with no biliary duct dilation. Pancreas: Diffuse pancreatic atrophy. No inflammatory changes or pancreatic duct dilation. Spleen: Normal in size without focal abnormality. Adrenals/Urinary Tract: There is marked distension of the urinary bladder, with limited evaluation of the bladder base due to streak artifact from bilateral hip arthroplasties. No obvious filling defects. There is mild bilateral renal cortical thinning, with grossly normal enhancement. No urinary tract calculi or obstruction. The adrenals are unremarkable. Stomach/Bowel:  No bowel obstruction or ileus. The appendix is not identified. No bowel wall thickening or inflammatory change. Diverticulosis of the distal colon without diverticulitis. Vascular/Lymphatic: Aortic  atherosclerosis. No enlarged abdominal or pelvic lymph nodes. Reproductive: Status post hysterectomy. No adnexal masses. Other: No free fluid or free intraperitoneal gas. No abdominal wall hernia. Musculoskeletal: Bilateral hip arthroplasties. No acute or destructive bony lesions. Reconstructed images demonstrate no additional findings. IMPRESSION: 1. Markedly distended urinary bladder. If patient is unable to void, catheter decompression could be considered. 2. Colonic diverticulosis without diverticulitis. 3. No bowel obstruction or ileus. 4. Small bilateral pleural effusions, right greater than left. 5. Cardiomegaly. 6. Aortic Atherosclerosis (ICD10-I70.0). Coronary artery atherosclerosis. Electronically Signed   By: Sharlet SalinaMichael  Brown M.D.   On: 03/10/2022 16:47    Pending Labs Unresulted Labs (From admission, onward)     Start     Ordered   03/10/22 1627  Na and K (sodium & potassium), rand urine  Once,   URGENT        03/10/22 1626   03/10/22 1627  Urea nitrogen, urine  Once,   URGENT        03/10/22 1626   03/10/22 1627  Uric acid, random urine  Once,   URGENT        03/10/22 1626   03/10/22 1626  Osmolality  Once,   URGENT        03/10/22 1626   03/10/22 1626  Osmolality, urine  Once,   URGENT        03/10/22 1626            Vitals/Pain Today's Vitals   03/10/22 1600 03/10/22 1630 03/10/22 1700 03/10/22 1730  BP: (!) 144/79 134/70 135/68 127/66  Pulse: 91 72 74 72  Resp: 18 17 16 15   Temp:      TempSrc:      SpO2: 96% 98% 99% 97%  Weight:      Height:      PainSc:        Isolation Precautions No active isolations  Medications Medications  magnesium sulfate IVPB 2 g 50 mL (has no administration in time range)  sodium chloride 0.9 % bolus 500 mL (0 mLs Intravenous Stopped 03/10/22 1542)  ondansetron (ZOFRAN) injection 4 mg (4 mg Intravenous Given 03/10/22 1518)  sodium chloride 0.9 % bolus 1,000 mL (0 mLs Intravenous Stopped 03/10/22 1755)  potassium chloride 10 mEq in  100 mL IVPB (0 mEq Intravenous Stopped 03/10/22 1755)  iohexol (OMNIPAQUE) 300 MG/ML solution 75 mL (75 mLs Intravenous Contrast Given 03/10/22 1635)  metoCLOPramide (REGLAN) injection 10 mg (10 mg Intravenous Given 03/10/22 1733)    Mobility walks with person assist High fall risk   Focused Assessments    R Recommendations: See Admitting Provider Note  Report given to:   Additional Notes:

## 2022-03-10 NOTE — Assessment & Plan Note (Signed)
History of CABG 2006 and subsequent stent placement 2020.  EKG showing regular rhythm, read as atrial flutter.   In the setting of hypokalemia and hypomagnesemia.  Follows with cardiologist Dr. Diona Browner, no documentation of prior atrial fibrillation or flutter.. -Repeat EKG in the morning -Continue aspirin

## 2022-03-10 NOTE — Assessment & Plan Note (Addendum)
Sodium 119.  3 months ago sodium was 139.  Baseline ~mid 130s.  Symptomatic with nausea, mild confusion, gait abnormality.  So far etiology may be from poor oral intake and diuretics concomitant hypokalemia. -1.5 L bolus given in ED, continue with gentle hydration with N/s +40 kcl @ 50 cc/hr for now considering systolic CHF -Follow-up urine sodium, urine osmolality, serum osmolality

## 2022-03-10 NOTE — Assessment & Plan Note (Addendum)
Stable and compensated.  Last echo 10/2021, EF of 35%, with grade 3 DD- restrictive. -Hold Lasix for now with hyponatremia

## 2022-03-10 NOTE — ED Triage Notes (Signed)
Pt nausea x5 days. Pt on Cipro for UTI. Took Cipro/Zofran this morning. Helped prevent vomiting but pt still nauseous.

## 2022-03-10 NOTE — ED Provider Notes (Signed)
[facility] EMERGENCY DEPARTMENT Provider Note   CSN: [id number] Arrival date & time: 03/10/22  1252     History  Chief Complaint  Patient presents with   Nausea    Erin Herman is a 88 y.o. female with a history significant for hypertension, hyperlipidemia, CHF type 2 diabetes, CAD with a history of MI, GERD who was recently taken off of her Protonix as it was causing fluid retention and placed on famotidine, also was just diagnosed with a UTI which daughter at the bedside states she has frequently, currently on day 3 of Cipro presenting with a 5-day history of persistent nausea and reduced appetite.   The history is provided by the patient and a relative.       Home Medications Prior to Admission medications   Medication Sig Start Date End Date Taking? Authorizing Provider  aspirin EC 81 MG tablet Take 1 tablet (81 mg total) by mouth daily. Swallow whole. 06/26/20  Yes Satira Sark, MD  Cholecalciferol (VITAMIN D3) 125 MCG (5000 UT) CAPS Take 5,000 Units by mouth daily.   Yes [provider]  CINNAMON PO Take 1 tablet by mouth 2 (two) times daily.   Yes [provider]  ciprofloxacin (CIPRO) 500 MG tablet Take 500 mg by mouth 2 (two) times daily. 03/08/22  Yes [provider]  Coenzyme Q10 (COQ10 PO) Take 1 tablet by mouth daily.   Yes [provider]  famotidine (PEPCID) 20 MG tablet Take 10 mg by mouth 2 (two) times daily.   Yes [provider]  furosemide (LASIX) 40 MG tablet Take 40 mg by mouth 2 (two) times daily. 08/26/21  Yes [provider]  isosorbide dinitrate (ISORDIL) 20 MG tablet Take 1 tablet (20 mg total) by mouth 2 (two) times daily. 12/22/21  Yes Satira Sark, MD  levothyroxine (SYNTHROID) 88 MCG tablet Take 88 mcg by mouth daily before breakfast.   Yes [provider]  metFORMIN (GLUCOPHAGE) 500 MG tablet Take 1 tablet (500 mg total) by mouth 2 (two) times daily with a meal. 11/10/18   Yes Mikhail, Seadrift, DO  metoprolol succinate (TOPROL XL) 25 MG 24 hr tablet Take 1 tablet (25 mg total) by mouth daily. 01/14/22  Yes Satira Sark, MD  Polyethyl Glycol-Propyl Glycol (SYSTANE OP) Place 1 drop into both eyes as needed (dry eye).   Yes [provider]  potassium chloride SA (K-DUR,KLOR-CON) 20 MEQ tablet Take 1 tablet (20 mEq total) by mouth daily after supper. 01/26/17  Yes Satira Sark, MD  psyllium (METAMUCIL) 58.6 % packet Take 1 packet by mouth daily.   Yes [provider]  rosuvastatin (CRESTOR) 5 MG tablet Take 1 tablet (5 mg total) by mouth daily. 12/01/21  Yes Satira Sark, MD  sertraline (ZOLOFT) 50 MG tablet Take 50 mg by mouth daily. 09/13/21  Yes [provider]  spironolactone (ALDACTONE) 25 MG tablet Take 0.5 tablets (12.5 mg total) by mouth daily. 07/06/16  Yes Herminio Commons, MD  vitamin C (ASCORBIC ACID) 500 MG tablet Take 500 mg by mouth daily.   Yes [provider]  Vitamin E 180 MG (400 UNIT) CAPS Take 400 Units by mouth daily.   Yes [provider]  acetaminophen (TYLENOL) 325 MG tablet Take 2 tablets (650 mg total) by mouth every 6 (six) hours as needed. Patient taking differently: Take 650 mg by mouth every 6 (six) hours as needed for mild pain. 06/18/21   Jaynee Eagles, PA-C  ONETOUCH ULTRA test strip USE 1 STRIP TO CHECK GLUCOSE ONCE DAILY 07/29/21   [provider]      Allergies    Lipitor [atorvastatin], Morphine, Nitroglycerin, Penicillins, and Reclast [zoledronic acid]    Review of Systems   Review of Systems  Constitutional:  Positive for fatigue. Negative for fever.  HENT:  Negative for congestion and sore throat.   Eyes: Negative.   Respiratory:  Negative for chest tightness and shortness of breath.   Cardiovascular:  Negative for chest pain.  Gastrointestinal:  Positive for nausea. Negative for abdominal pain and vomiting.  Genitourinary: Negative.   Musculoskeletal:   Negative for arthralgias, joint swelling and neck pain.  Skin: Negative.  Negative for rash and wound.  Neurological:  Negative for dizziness, weakness, light-headedness, numbness and headaches.  Psychiatric/Behavioral: Negative.      Physical Exam Updated Vital Signs BP 135/68   Pulse 74   Temp 98.2 F (36.8 C) (Oral)   Resp 16   Ht 5\' 4"  (1.626 m)   Wt 56.2 kg   LMP  (LMP Unknown)   SpO2 99%   BMI 21.28 kg/m  Physical Exam Vitals and nursing note reviewed.  Constitutional:      Appearance: She is well-developed. She is not toxic-appearing.     Comments: Pale, appears fatigued  HENT:     Head: Normocephalic and atraumatic.  Eyes:     Conjunctiva/sclera: Conjunctivae normal.  Cardiovascular:     Rate and Rhythm: Normal rate and regular rhythm.     Heart sounds: Normal heart sounds.  Pulmonary:     Effort: Pulmonary effort is normal.     Breath sounds: Normal breath sounds. No wheezing.  Abdominal:     General: Bowel sounds are normal. There is distension.     Palpations: Abdomen is soft.     Tenderness: There is no abdominal tenderness. There is no guarding.  Musculoskeletal:        General: No swelling. Normal range of motion.     Cervical back: Normal range of motion.  Skin:    General: Skin is warm and dry.  Neurological:     General: No focal deficit present.     Mental Status: She is alert.     Comments: No obvious confusion, answers questions appropriately but is slow to answer.      ED Results / Procedures / Treatments   Labs (all labs ordered are listed, but only abnormal results are displayed) Labs Reviewed  COMPREHENSIVE METABOLIC PANEL - Abnormal; Notable for the following components:      Result Value   Sodium 119 (*)    Potassium 2.2 (*)    Chloride 69 (*)    CO2 37 (*)    Glucose, Bld 110 (*)    Creatinine, Ser 1.17 (*)    Calcium 8.6 (*)    GFR, Estimated 44 (*)    All other components within normal limits  URINALYSIS, ROUTINE W REFLEX  MICROSCOPIC - Abnormal; Notable for the following components:   APPearance HAZY (*)    Specific Gravity, Urine 1.004 (*)    Hgb urine dipstick SMALL (*)    Leukocytes,Ua LARGE (*)    WBC, UA >50 (*)    All other components within normal limits  MAGNESIUM - Abnormal; Notable for the following components:   Magnesium 1.6 (*)    All other components within normal limits  CBC WITH DIFFERENTIAL/PLATELET  LIPASE, BLOOD  OSMOLALITY  OSMOLALITY, URINE  NA AND K (SODIUM &  POTASSIUM), RAND UR  UREA NITROGEN, URINE  URIC ACID, RANDOM URINE    EKG EKG Interpretation  Date/Time:  Wednesday March 10 2022 15:23:38 EST Ventricular Rate:  68 PR Interval:    QRS Duration: 179 QT Interval:  499 QTC Calculation: 531 R Axis:   254 Text Interpretation: Left bundle branch block similar to prior no stemi Confirmed by Wynona Dove ([id number]) on 03/10/2022 4:27:32 PM  Radiology CT ABDOMEN PELVIS W CONTRAST  Result Date: 03/10/2022 CLINICAL DATA:  Nausea for 5 days EXAM: CT ABDOMEN AND PELVIS WITH CONTRAST TECHNIQUE: Multidetector CT imaging of the abdomen and pelvis was performed using the standard protocol following bolus administration of intravenous contrast. RADIATION DOSE REDUCTION: This exam was performed according to the departmental dose-optimization program which includes automated exposure control, adjustment of the mA and/or kV according to patient size and/or use of iterative reconstruction technique. CONTRAST:  85mL OMNIPAQUE IOHEXOL 300 MG/ML  SOLN COMPARISON:  None Available. FINDINGS: Lower chest: The heart is enlarged without pericardial effusion. Extensive atherosclerosis of the aorta and native coronary vessels. There are small bilateral pleural effusions, right greater than left, with minimal dependent lower lobe atelectasis. Hepatobiliary: Cholecystectomy. Liver is grossly unremarkable with no biliary duct dilation. Pancreas: Diffuse pancreatic atrophy. No inflammatory changes or  pancreatic duct dilation. Spleen: Normal in size without focal abnormality. Adrenals/Urinary Tract: There is marked distension of the urinary bladder, with limited evaluation of the bladder base due to streak artifact from bilateral hip arthroplasties. No obvious filling defects. There is mild bilateral renal cortical thinning, with grossly normal enhancement. No urinary tract calculi or obstruction. The adrenals are unremarkable. Stomach/Bowel: No bowel obstruction or ileus. The appendix is not identified. No bowel wall thickening or inflammatory change. Diverticulosis of the distal colon without diverticulitis. Vascular/Lymphatic: Aortic atherosclerosis. No enlarged abdominal or pelvic lymph nodes. Reproductive: Status post hysterectomy. No adnexal masses. Other: No free fluid or free intraperitoneal gas. No abdominal wall hernia. Musculoskeletal: Bilateral hip arthroplasties. No acute or destructive bony lesions. Reconstructed images demonstrate no additional findings. IMPRESSION: 1. Markedly distended urinary bladder. If patient is unable to void, catheter decompression could be considered. 2. Colonic diverticulosis without diverticulitis. 3. No bowel obstruction or ileus. 4. Small bilateral pleural effusions, right greater than left. 5. Cardiomegaly. 6. Aortic Atherosclerosis (ICD10-I70.0). Coronary artery atherosclerosis. Electronically Signed   By: Randa Ngo M.D.   On: 03/10/2022 16:47    Procedures Procedures    Medications Ordered in ED Medications  potassium chloride 10 mEq in 100 mL IVPB (10 mEq Intravenous New Bag/Given 03/10/22 1653)  magnesium sulfate IVPB 2 g 50 mL (has no administration in time range)  sodium chloride 0.9 % bolus 500 mL (0 mLs Intravenous Stopped 03/10/22 1542)  ondansetron (ZOFRAN) injection 4 mg (4 mg Intravenous Given 03/10/22 1518)  sodium chloride 0.9 % bolus 1,000 mL (1,000 mLs Intravenous New Bag/Given 03/10/22 1651)  iohexol (OMNIPAQUE) 300 MG/ML solution 75  mL (75 mLs Intravenous Contrast Given 03/10/22 1635)  metoCLOPramide (REGLAN) injection 10 mg (10 mg Intravenous Given 03/10/22 1733)    ED Course/ Medical Decision Making/ A&P                           Medical Decision Making Patient with a 5-day history of nausea and reduced oral intake secondary to nausea.  Worsening generalized weakness, denies abdominal pain, chest pain, shortness of breath, diarrhea and fevers.  She was diagnosed with a UTI by her PCP on day  3 of Cipro, denied dysuria but daughter reports frequent UTIs.  Had recently been taken off of Protonix secondary to fluid retention.  Amount and/or Complexity of Data Reviewed Labs: ordered.    Details: Labs are significant for sodium of 119, potassium of 2.2 and a magnesium of 1.6.  She also continues to have a large number of leukocytes in her urine, no bacteria, nitrite negative. Radiology: ordered.    Details: Distended urinary bladder.  She has been able to void for [place].  Diverticulosis without diverticulitis, no bowel obstruction.  Small bilateral pleural effusions. ECG/medicine tests: ordered.    Details: Left bundle branch block unchanged Discussion of management or test interpretation with external provider(s): Patient discussed with Dr. Denton Brick of the hospitalist service who accepts patient for admission.  Risk Prescription drug management. Decision regarding hospitalization. Risk Details: Patient has been given 1 L of normal saline initially to start sodium replacement.  IV potassium and magnesium also ordered.   CRITICAL CARE Performed by: Evalee Jefferson Total critical care time: 40 minutes Critical care time was exclusive of separately billable procedures and treating other patients. Critical care was necessary to treat or prevent imminent or life-threatening deterioration. Critical care was time spent personally by me on the following activities: development of treatment plan with patient and/or surrogate as well as  nursing, discussions with consultants, evaluation of patient's response to treatment, examination of patient, obtaining history from patient or surrogate, ordering and performing treatments and interventions, ordering and review of laboratory studies, ordering and review of radiographic studies, pulse oximetry and re-evaluation of patient's condition.         Final Clinical Impression(s) / ED Diagnoses Final diagnoses:  Hypokalemia  Hyponatremia  Hypomagnesemia  Nausea    Rx / DC Orders ED Discharge Orders     None         Landis Martins 03/10/22 Minda Meo, MD 03/12/22 1844

## 2022-03-11 DIAGNOSIS — E871 Hypo-osmolality and hyponatremia: Secondary | ICD-10-CM | POA: Diagnosis not present

## 2022-03-11 LAB — CBC
HCT: 32.5 % — ABNORMAL LOW (ref 36.0–46.0)
Hemoglobin: 11.2 g/dL — ABNORMAL LOW (ref 12.0–15.0)
MCH: 32.1 pg (ref 26.0–34.0)
MCHC: 34.5 g/dL (ref 30.0–36.0)
MCV: 93.1 fL (ref 80.0–100.0)
Platelets: 258 10*3/uL (ref 150–400)
RBC: 3.49 MIL/uL — ABNORMAL LOW (ref 3.87–5.11)
RDW: 13.2 % (ref 11.5–15.5)
WBC: 7.4 10*3/uL (ref 4.0–10.5)
nRBC: 0 % (ref 0.0–0.2)

## 2022-03-11 LAB — OSMOLALITY: Osmolality: 257 mOsm/kg — ABNORMAL LOW (ref 275–295)

## 2022-03-11 LAB — HEMOGLOBIN A1C
Hgb A1c MFr Bld: 6.6 % — ABNORMAL HIGH (ref 4.8–5.6)
Mean Plasma Glucose: 142.72 mg/dL

## 2022-03-11 LAB — BASIC METABOLIC PANEL
Anion gap: 8 (ref 5–15)
BUN: 15 mg/dL (ref 8–23)
CO2: 36 mmol/L — ABNORMAL HIGH (ref 22–32)
Calcium: 8.4 mg/dL — ABNORMAL LOW (ref 8.9–10.3)
Chloride: 84 mmol/L — ABNORMAL LOW (ref 98–111)
Creatinine, Ser: 0.99 mg/dL (ref 0.44–1.00)
GFR, Estimated: 53 mL/min — ABNORMAL LOW (ref >60)
Glucose, Bld: 89 mg/dL (ref 70–99)
Potassium: 3.6 mmol/L (ref 3.5–5.1)
Sodium: 128 mmol/L — ABNORMAL LOW (ref 135–145)

## 2022-03-11 LAB — GLUCOSE, CAPILLARY
Glucose-Capillary: 102 mg/dL — ABNORMAL HIGH (ref 70–99)
Glucose-Capillary: 159 mg/dL — ABNORMAL HIGH (ref 70–99)
Glucose-Capillary: 153 mg/dL — ABNORMAL HIGH (ref 70–99)
Glucose-Capillary: 155 mg/dL — ABNORMAL HIGH (ref 70–99)

## 2022-03-11 LAB — MRSA NEXT GEN BY PCR, NASAL: MRSA by PCR Next Gen: NOT DETECTED

## 2022-03-11 LAB — URIC ACID, RANDOM URINE: Uric Acid, Urine: 25.1 mg/dL

## 2022-03-11 LAB — UREA NITROGEN, URINE: Urea Nitrogen, Ur: 208 mg/dL

## 2022-03-11 LAB — MAGNESIUM: Magnesium: 1.9 mg/dL (ref 1.7–2.4)

## 2022-03-11 LAB — OSMOLALITY, URINE: Osmolality, Ur: 144 mOsm/kg — ABNORMAL LOW (ref 300–900)

## 2022-03-11 MED ORDER — CHLORHEXIDINE GLUCONATE CLOTH 2 % EX PADS
6.0000 | MEDICATED_PAD | Freq: Every day | CUTANEOUS | Status: DC
  Administered 2022-03-11 – 2022-03-18 (×9): 6 via TOPICAL

## 2022-03-11 MED ORDER — TAMSULOSIN HCL 0.4 MG PO CAPS
0.4000 mg | ORAL_CAPSULE | Freq: Every day | ORAL | Status: DC
  Administered 2022-03-11 – 2022-03-12 (×2): 0.4 mg via ORAL
  Filled 2022-03-11 (×2): qty 1

## 2022-03-11 NOTE — Progress Notes (Signed)
   03/11/22 1108  Clinical Encounter Type  Visited With Patient;Family  Visit Type Initial;Spiritual support  Referral From Nurse  Consult/Referral To Chaplain  Spiritual Encounters  Spiritual Needs Grief support;Emotional;Prayer  Stress Factors  Patient Stress Factors Loss;Major life changes  Family Stress Factors Loss   CH visited pt. per verbal referral from staff in IDT rounds.  Pt. sitting in recliner with daughter [name] at bedside.  Pt. lost her husband of 75 years on 02-20-23.  Husband passed away one day after turning 89.  Pt. became tearful and visibly grieved in speaking of her late husband; dtr. shared that he had been active and relatively healthy until March of this past year when he suffered a major heart attack.  Dtr. reflected that since March pt.'s home had been filled with caregivers attending to pt.'s late husband, but pt.'s home is now relatively empty.  Pt. says she had not been eating very much due to grief.  Pt. and dtr. requested prayer and CH offered prayer for pt. and family to experience God's presence and accompaniment during this difficult time.  Pt. says she is scheduled for discharge tomorrow.  Chaplains remain available as needed.  Elpidio Anis [employer]

## 2022-03-11 NOTE — TOC Progression Note (Signed)
  Transition of Care [facility]) Screening Note   Patient Details  Name: Erin Herman Date of Birth: 1928/02/08   Transition of Care [facility]) CM/SW Contact:    Elliot Gault, LCSW Phone Number: 03/11/2022, 12:00 PM    Transition of Care Department [facility]) has reviewed patient and no TOC needs have been identified at this time. We will continue to monitor patient advancement through interdisciplinary progression rounds. If new patient transition needs arise, please place a TOC consult.

## 2022-03-11 NOTE — Progress Notes (Signed)
PROGRESS NOTE     Erin Herman, is a 88 y.o. female, DOB - 03/16/1928, [id number]  Admit date - 03/10/2022   Admitting Physician Ejiroghene Wendall Stade, MD  Outpatient Primary MD for the patient is Kirstie Peri, MD  LOS - 1  Chief Complaint  Patient presents with   Nausea        Brief Narrative:   88 y.o. female with medical history significant for systolic and diastolic CHF, CABG, left bundle branch block, diabetes mellitus, hypertension admitted with symptomatic hyponatremia    -Assessment and Plan: 1) acute symptomatic hyponatremia Sodium 119 on admission.   3 months ago sodium was 139.  Baseline ~mid 130s.  -Suspect dehydration related in the setting of poor oral intake and spironolactone and Lasix use -Occasional episodes of nausea vomiting and diarrhea but not enough to explain degree of electrolyte derangement but just GI losses -Sodium improving with IV fluids -Mentation improving as per patient's daughter Erin Herman who is at bedside  2)Combined systolic and diastolic congestive heart failure (HCC) Stable and compensated.  Last echo 10/2021, EF of 35%, with grade 3 DD- restrictive. -Be judicious with IV fluids -Lasix and spironolactone currently on hold given #1 above  3) chronically prolonged QT interval Prolonged at 531, chronic.  -Replace electrolytes  4)Hypokalemia/hypomagnesemia -Replace and recheck electrolytes -Lasix remains on hold  5)DM2 -A1c 6.6 reflecting good diabetic control PTA -Use Novolog/Humalog Sliding scale insulin with Accu-Cheks/Fingersticks as ordered  -Continue to hold metformin  6)CAD, NATIVE VESSEL History of CABG 2006 and subsequent stent placement 2020.   -Pain-free, continue aspirin, isosorbide and metoprolol  7)Essential hypertension, benign Stable. -Resume Imdur, metoprolol -Hold Lasix, spironolactone for now  8) urinary retention--Flomax as ordered, in and out catheterization as needed  Disposition/Need for  in-Hospital Stay- patient unable to be discharged at this time due to -acute symptomatic hyponatremia requiring IV fluids in the setting of dehydration and diuretic use,  Status is: Inpatient   Disposition: The patient is from: Home              Anticipated d/c is to: Home              Anticipated d/c date is: 2 days              Patient currently is not medically stable to d/c. Barriers: Not Clinically Stable-   Code Status :  -  Code Status: Full Code   Family Communication:    Discussed with patient's daughter Erin Herman who is at bedside  DVT Prophylaxis  :   - SCDs   enoxaparin (LOVENOX) injection 30 mg Start: 03/10/22 2015   Lab Results  Component Value Date   PLT 258 03/11/2022    Inpatient Medications  Scheduled Meds:  aspirin EC  81 mg Oral Daily   Chlorhexidine Gluconate Cloth  6 each Topical Daily   enoxaparin (LOVENOX) injection  30 mg Subcutaneous Q24H   insulin aspart  0-5 Units Subcutaneous QHS   insulin aspart  0-9 Units Subcutaneous TID WC   isosorbide dinitrate  20 mg Oral BID   levothyroxine  88 mcg Oral Q0600   metoprolol succinate  25 mg Oral Daily   tamsulosin  0.4 mg Oral QPC supper   Continuous Infusions:  0.9 % NaCl with KCl 40 mEq / L 50 mL/hr at 03/11/22 1826   PRN Meds:.acetaminophen **OR** acetaminophen, polyethylene glycol, promethazine   Anti-infectives (From admission, onward)    None         Subjective: Erin  Herman today has no fevers, no emesis,  No chest pain,     Objective: Vitals:   03/11/22 1600 03/11/22 1642 03/11/22 1700 03/11/22 1800  BP: 113/75  117/77 112/82  Pulse: (!) 113  (!) 113 (!) 110  Resp: 18  18 20   Temp:  98 F (36.7 C)    TempSrc:  Oral    SpO2: 95%  94% 94%  Weight:      Height:        Intake/Output Summary (Last 24 hours) at 03/11/2022 1832 Last data filed at 03/11/2022 1826 Gross per 24 hour  Intake 1811.83 ml  Output 1502 ml  Net 309.83 ml   Filed Weights   03/10/22 1311 03/10/22  1841  Weight: 56.2 kg 57.8 kg   Physical Exam  Gen:- Awake Alert,  in no apparent distress  HEENT:- [place].AT, No sclera icterus Neck-Supple Neck,No JVD,.  Lungs-  CTAB , fair symmetrical air movement CV- S1, S2 normal, regular  Abd-  +ve B.Sounds, Abd Soft, No tenderness,    Extremity/Skin:- No  edema, pedal pulses present  Psych-affect is appropriate, oriented x3 Neuro-generalized weakness, no new focal deficits, no tremors  Data Reviewed: I have personally reviewed following labs and imaging studies  CBC: Recent Labs  Lab 03/10/22 1430 03/11/22 0441  WBC 7.0 7.4  NEUTROABS 5.2  --   HGB 12.7 11.2*  HCT 36.0 32.5*  MCV 91.6 93.1  PLT 263 258   Basic Metabolic Panel: Recent Labs  Lab 03/10/22 1430 03/11/22 0441  NA 119* 128*  K 2.2* 3.6  CL 69* 84*  CO2 37* 36*  GLUCOSE 110* 89  BUN 19 15  CREATININE 1.17* 0.99  CALCIUM 8.6* 8.4*  MG 1.6* 1.9   GFR: Estimated Creatinine Clearance: 31.3 mL/min (by C-G formula based on SCr of 0.99 mg/dL). Liver Function Tests: Recent Labs  Lab 03/10/22 1430  AST 34  ALT 23  ALKPHOS 74  BILITOT 0.8  PROT 7.0  ALBUMIN 4.1   Cardiac Enzymes: No results for input(s): "CKTOTAL", "CKMB", "CKMBINDEX", "TROPONINI" in the last 168 hours. BNP (last 3 results) No results for input(s): "PROBNP" in the last 8760 hours. HbA1C: Recent Labs    03/11/22 0440  HGBA1C 6.6*   Sepsis Labs: @LABRCNTIP (procalcitonin:4,lacticidven:4) ) Recent Results (from the past 240 hour(s))  MRSA Next Gen by PCR, Nasal     Status: None   Collection Time: 03/10/22  6:37 PM   Specimen: Nasal Mucosa; Nasal Swab  Result Value Ref Range Status   MRSA by PCR Next Gen NOT DETECTED NOT DETECTED Final    Comment: (NOTE) The GeneXpert MRSA Assay (FDA approved for NASAL specimens only), is one component of a comprehensive MRSA colonization surveillance program. It is not intended to diagnose MRSA infection nor to guide or monitor treatment for MRSA  infections. Test performance is not FDA approved in patients less than 61 years old. Performed at [facility], [address], [employer], [address] [place]       Radiology Studies: CT ABDOMEN PELVIS W CONTRAST  Result Date: 03/10/2022 CLINICAL DATA:  Nausea for 5 days EXAM: CT ABDOMEN AND PELVIS WITH CONTRAST TECHNIQUE: Multidetector CT imaging of the abdomen and pelvis was performed using the standard protocol following bolus administration of intravenous contrast. RADIATION DOSE REDUCTION: This exam was performed according to the departmental dose-optimization program which includes automated exposure control, adjustment of the mA and/or kV according to patient size and/or use of iterative reconstruction technique. CONTRAST:  98mL OMNIPAQUE IOHEXOL  300 MG/ML  SOLN COMPARISON:  None Available. FINDINGS: Lower chest: The heart is enlarged without pericardial effusion. Extensive atherosclerosis of the aorta and native coronary vessels. There are small bilateral pleural effusions, right greater than left, with minimal dependent lower lobe atelectasis. Hepatobiliary: Cholecystectomy. Liver is grossly unremarkable with no biliary duct dilation. Pancreas: Diffuse pancreatic atrophy. No inflammatory changes or pancreatic duct dilation. Spleen: Normal in size without focal abnormality. Adrenals/Urinary Tract: There is marked distension of the urinary bladder, with limited evaluation of the bladder base due to streak artifact from bilateral hip arthroplasties. No obvious filling defects. There is mild bilateral renal cortical thinning, with grossly normal enhancement. No urinary tract calculi or obstruction. The adrenals are unremarkable. Stomach/Bowel: No bowel obstruction or ileus. The appendix is not identified. No bowel wall thickening or inflammatory change. Diverticulosis of the distal colon without diverticulitis. Vascular/Lymphatic: Aortic atherosclerosis. No enlarged abdominal or pelvic lymph nodes.  Reproductive: Status post hysterectomy. No adnexal masses. Other: No free fluid or free intraperitoneal gas. No abdominal wall hernia. Musculoskeletal: Bilateral hip arthroplasties. No acute or destructive bony lesions. Reconstructed images demonstrate no additional findings. IMPRESSION: 1. Markedly distended urinary bladder. If patient is unable to void, catheter decompression could be considered. 2. Colonic diverticulosis without diverticulitis. 3. No bowel obstruction or ileus. 4. Small bilateral pleural effusions, right greater than left. 5. Cardiomegaly. 6. Aortic Atherosclerosis (ICD10-I70.0). Coronary artery atherosclerosis. Electronically Signed   By: Sharlet Salina M.D.   On: 03/10/2022 16:47     Scheduled Meds:  aspirin EC  81 mg Oral Daily   Chlorhexidine Gluconate Cloth  6 each Topical Daily   enoxaparin (LOVENOX) injection  30 mg Subcutaneous Q24H   insulin aspart  0-5 Units Subcutaneous QHS   insulin aspart  0-9 Units Subcutaneous TID WC   isosorbide dinitrate  20 mg Oral BID   levothyroxine  88 mcg Oral Q0600   metoprolol succinate  25 mg Oral Daily   tamsulosin  0.4 mg Oral QPC supper   Continuous Infusions:  0.9 % NaCl with KCl 40 mEq / L 50 mL/hr at 03/11/22 1826     LOS: 1 day    Shon Hale M.D on 03/11/2022 at 6:32 PM  Go to [email or web address] - for contact info  [facility] - Office  [phone]  If 7PM-7AM, please contact night-coverage [email or web address] 03/11/2022, 6:32 PM

## 2022-03-12 DIAGNOSIS — E871 Hypo-osmolality and hyponatremia: Secondary | ICD-10-CM | POA: Diagnosis not present

## 2022-03-12 LAB — BASIC METABOLIC PANEL
Anion gap: 9 (ref 5–15)
Anion gap: 8 (ref 5–15)
BUN: 17 mg/dL (ref 8–23)
BUN: 17 mg/dL (ref 8–23)
CO2: 29 mmol/L (ref 22–32)
CO2: 27 mmol/L (ref 22–32)
Calcium: 8.6 mg/dL — ABNORMAL LOW (ref 8.9–10.3)
Calcium: 9 mg/dL (ref 8.9–10.3)
Chloride: 86 mmol/L — ABNORMAL LOW (ref 98–111)
Chloride: 87 mmol/L — ABNORMAL LOW (ref 98–111)
Creatinine, Ser: 0.95 mg/dL (ref 0.44–1.00)
Creatinine, Ser: 0.98 mg/dL (ref 0.44–1.00)
GFR, Estimated: 56 mL/min — ABNORMAL LOW (ref >60)
GFR, Estimated: 54 mL/min — ABNORMAL LOW (ref >60)
Glucose, Bld: 145 mg/dL — ABNORMAL HIGH (ref 70–99)
Glucose, Bld: 179 mg/dL — ABNORMAL HIGH (ref 70–99)
Potassium: 4.4 mmol/L (ref 3.5–5.1)
Potassium: 5.3 mmol/L — ABNORMAL HIGH (ref 3.5–5.1)
Sodium: 124 mmol/L — ABNORMAL LOW (ref 135–145)
Sodium: 122 mmol/L — ABNORMAL LOW (ref 135–145)

## 2022-03-12 LAB — CBC
HCT: 33.4 % — ABNORMAL LOW (ref 36.0–46.0)
Hemoglobin: 11.4 g/dL — ABNORMAL LOW (ref 12.0–15.0)
MCH: 32.3 pg (ref 26.0–34.0)
MCHC: 34.1 g/dL (ref 30.0–36.0)
MCV: 94.6 fL (ref 80.0–100.0)
Platelets: 244 10*3/uL (ref 150–400)
RBC: 3.53 MIL/uL — ABNORMAL LOW (ref 3.87–5.11)
RDW: 13.4 % (ref 11.5–15.5)
WBC: 7.1 10*3/uL (ref 4.0–10.5)
nRBC: 0 % (ref 0.0–0.2)

## 2022-03-12 LAB — GLUCOSE, CAPILLARY
Glucose-Capillary: 138 mg/dL — ABNORMAL HIGH (ref 70–99)
Glucose-Capillary: 201 mg/dL — ABNORMAL HIGH (ref 70–99)
Glucose-Capillary: 174 mg/dL — ABNORMAL HIGH (ref 70–99)
Glucose-Capillary: 219 mg/dL — ABNORMAL HIGH (ref 70–99)
Glucose-Capillary: 173 mg/dL — ABNORMAL HIGH (ref 70–99)

## 2022-03-12 MED ORDER — SODIUM CHLORIDE 0.9 % IV SOLN: INTRAVENOUS | Status: DC

## 2022-03-12 MED ORDER — FAMOTIDINE 20 MG PO TABS
20.0000 mg | ORAL_TABLET | Freq: Every day | ORAL | Status: DC
  Administered 2022-03-12 – 2022-03-30 (×18): 20 mg via ORAL
  Filled 2022-03-12 (×19): qty 1

## 2022-03-12 MED ORDER — PANTOPRAZOLE SODIUM 40 MG PO TBEC: 40.0000 mg | DELAYED_RELEASE_TABLET | Freq: Every day | ORAL | Status: DC

## 2022-03-12 MED ORDER — MAGNESIUM HYDROXIDE 400 MG/5ML PO SUSP
30.0000 mL | Freq: Once | ORAL | Status: AC
  Administered 2022-03-12: 30 mL via ORAL
  Filled 2022-03-12: qty 30

## 2022-03-12 NOTE — TOC Initial Note (Signed)
Transition of Care [facility]) - Initial/Assessment Note    Patient Details  Name: Erin Herman MRN: [id number] Date of Birth: 11-02-1927  Transition of Care [facility]) CM/SW Contact:    Shade Flood, LCSW Phone Number: 03/12/2022, 12:29 PM  Clinical Narrative:                  Pt admitted from home. Pt's husband recently passed away and pt now lives alone with her cat. MD anticipating weekend dc with need for HH at dc.  Met with pt and her daughter at bedside to review dc planning. Pt reports having all the DME she needs at home. Family available to assist pt as needed. Discussed referral for [facility] PT/RN at dc and pt states she believes this would be helpful. CMS provider options reviewed and referred to [facility] at pt request.   Weekend TOC to follow.  Expected Discharge Plan: [place] Barriers to Discharge: Continued Medical Work up   Patient Goals and CMS Choice Patient states their goals for this hospitalization and ongoing recovery are:: return home CMS Medicare.gov Compare Post Acute Care list provided to:: Patient Choice offered to / list presented to : Patient  Expected Discharge Plan and Services Expected Discharge Plan: [place] In-house Referral: Clinical Social Work   Post Acute Care Choice: [place] arrangements for the past 2 months: [place]: RN, PT [place] Agency: [place] Date [place]: 03/12/22   Representative spoke with at [place]: Tommi Rumps  Prior Living Arrangements/Services Living arrangements for the past 2 months: [place] Lives with:: Pets Patient language and need for interpreter reviewed:: Yes Do you feel safe going back to the place where you live?: Yes      Need for Family Participation in Patient Care: No (Comment) Care giver support system in place?: Yes (comment) Current home services: DME Criminal Activity/Legal  Involvement Pertinent to Current Situation/Hospitalization: No - Comment as needed  Activities of Daily Living Home Assistive Devices/Equipment: Cane (specify quad or straight) ADL Screening (condition at time of admission) Patient's cognitive ability adequate to safely complete daily activities?: Yes Is the patient deaf or have difficulty hearing?: No Does the patient have difficulty seeing, even when wearing glasses/contacts?: No Does the patient have difficulty concentrating, remembering, or making decisions?: No Patient able to express need for assistance with ADLs?: Yes Does the patient have difficulty dressing or bathing?: No Independently performs ADLs?: Yes (appropriate for developmental age) Does the patient have difficulty walking or climbing stairs?: No Weakness of Legs: Both Weakness of Arms/Hands: None  Permission Sought/Granted Permission sought to share information with : Facility Art therapist granted to share information with : Yes, Verbal Permission Granted     Permission granted to share info w AGENCY: HH        Emotional Assessment Appearance:: Appears younger than stated age Attitude/Demeanor/Rapport: Engaged Affect (typically observed): Pleasant Orientation: : Oriented to Self, Oriented to Place, Oriented to  Time, Oriented to Situation Alcohol / Substance Use: Not Applicable Psych Involvement: No (comment)  Admission diagnosis:  Hypokalemia [E87.6] Hypomagnesemia [E83.42] Hyponatremia [E87.1] Nausea [R11.0] Patient Active Problem List   Diagnosis Date Noted   Hyponatremia 03/10/2022   Prolonged QT interval 03/10/2022   Combined systolic and diastolic congestive heart failure ([place]) 03/10/2022  Hypothyroidism 11/23/2021   Leukocytosis 11/23/2021   Depression 07/29/2021   Hypokalemia 07/29/2021   GERD (gastroesophageal reflux disease) 12/29/2020   Chest pain 53/20/2334   Chronic systolic (congestive) heart failure [facility])     Status post left hip replacement 04/29/2020   NSTEMI (non-ST elevated myocardial infarction) ([place]) 11/04/2018   Dysphagia 05/10/2018   Diabetes ([place]) 02/27/2018   S/P right THA, AA 03/01/2017   Postoperative anemia due to acute blood loss 05/22/2014   DJD (degenerative joint disease) of knee 05/20/2014   Primary localized osteoarthritis of left knee 04/30/2014   Preoperative cardiovascular examination 04/04/2014   Chronic diastolic heart failure ([place]) 07/03/2012   CAROTID ARTERY DISEASE 12/09/2009   Essential hypertension, benign 08/05/2008   CAD, NATIVE VESSEL 08/05/2008   Secondary cardiomyopathy ([place]) 08/05/2008   Aortic valve disorder 08/05/2008   Hyperlipidemia 08/03/2008   PCP:  Monico Blitz, MD Pharmacy:   [facility] ORDER) [place], [place] [address] Phone: [phone] Fax: [phone]  [facility] [id number] Geralyn Flash, [place] [facility] [address] AT [facility] [place] [address] [place] [address] Phone: [phone] Fax: [phone]  [facility] [place], [place] [address] Phone: [phone] Fax: [phone]  [place] [address] [place] [address] Phone: [phone] Fax: [phone]     Social Determinants of Health (SDOH) Interventions    Readmission Risk Interventions     No data to display

## 2022-03-12 NOTE — Progress Notes (Signed)
PROGRESS NOTE     Erin Herman, is a 88 y.o. female, DOB - [birth year], EKC:[id number]  Admit date - 03/10/2022   Admitting Physician Ejiroghene Wendall Stade, MD  Outpatient Primary MD for the patient is Kirstie Peri, MD  LOS - 2  Chief Complaint  Patient presents with   Nausea        Brief Narrative:   88 y.o. female with medical history significant for systolic and diastolic CHF, CABG, left bundle branch block, diabetes mellitus, hypertension admitted with symptomatic hyponatremia    -Assessment and Plan: 1) acute symptomatic hyponatremia Sodium 119 on admission.   3 months ago sodium was 139.  Baseline ~mid 130s.  -Suspect dehydration related in the setting of poor oral intake and spironolactone and Lasix use -Occasional episodes of nausea vomiting and diarrhea but not enough to explain degree of electrolyte derangement but just GI losses -Sodium is 124 despite  IV fluids -Mentation improving as per patient's daughter [name] who is at bedside  2)Combined systolic and diastolic congestive heart failure (HCC) Stable and compensated.  Last echo 10/2021, EF of 35%, with grade 3 DD- restrictive. -Be judicious with IV fluids -Lasix and spironolactone currently on hold given #1 above  3) chronically prolonged QT interval Prolonged at 531, chronic.  -Replace electrolytes  4)Hypokalemia/hypomagnesemia -Replace and recheck electrolytes -Lasix remains on hold  5)DM2 -A1c 6.6 reflecting good diabetic control PTA -Use Novolog/Humalog Sliding scale insulin with Accu-Cheks/Fingersticks as ordered  -Continue to hold metformin  6)CAD, NATIVE VESSEL History of CABG 2006 and subsequent stent placement 2020.   -Chest pain-free, continue aspirin and metoprolol -Hold isosorbide due to soft BP  7)Essential hypertension, benign Stable. -Stop Imdur due to soft BP,  -Continue metoprolol due to tachycardia -Hold Lasix, spironolactone for now  8)Urinary retention--Flomax as  ordered, in and out catheterization as needed  Disposition/Need for in-Hospital Stay- patient unable to be discharged at this time due to -acute symptomatic hyponatremia requiring IV fluids in the setting of dehydration and diuretic use,  Status is: Inpatient   Disposition: The patient is from: Home              Anticipated d/c is to: Home              Anticipated d/c date is: 2 days              Patient currently is not medically stable to d/c. Barriers: Not Clinically Stable-   Code Status :  -  Code Status: Full Code   Family Communication:    Discussed with patient's daughter [name] who is at bedside  DVT Prophylaxis  :   - SCDs   enoxaparin (LOVENOX) injection 30 mg Start: 03/10/22 2015   Lab Results  Component Value Date   PLT 244 03/12/2022    Inpatient Medications  Scheduled Meds:  aspirin EC  81 mg Oral Daily   Chlorhexidine Gluconate Cloth  6 each Topical Daily   enoxaparin (LOVENOX) injection  30 mg Subcutaneous Q24H   insulin aspart  0-5 Units Subcutaneous QHS   insulin aspart  0-9 Units Subcutaneous TID WC   isosorbide dinitrate  20 mg Oral BID   levothyroxine  88 mcg Oral Q0600   metoprolol succinate  25 mg Oral Daily   tamsulosin  0.4 mg Oral QPC supper   Continuous Infusions:  0.9 % NaCl with KCl 40 mEq / L 50 mL/hr at 03/11/22 1826   PRN Meds:.acetaminophen **OR** acetaminophen, polyethylene glycol, promethazine   Anti-infectives (From  admission, onward)    None         Subjective: Erin Herman today has no fevers, no emesis,  No chest pain,   -BP appears to be soft -Denies dizziness -Voiding difficulties persist  Objective: Vitals:   03/12/22 0816 03/12/22 0900 03/12/22 0912 03/12/22 1000  BP: (!) 130/108  100/62 95/63  Pulse: 96 85 (!) 112 (!) 113  Resp:  19 (!) 25 20  Temp:      TempSrc:      SpO2:  91% 91% 96%  Weight:      Height:        Intake/Output Summary (Last 24 hours) at 03/12/2022 1057 Last data filed at  03/12/2022 0800 Gross per 24 hour  Intake 1211.44 ml  Output 1402 ml  Net -190.56 ml   Filed Weights   03/10/22 1311 03/10/22 1841  Weight: 56.2 kg 57.8 kg   Physical Exam  Gen:- Awake Alert,  in no apparent distress  HEENT:- [place].AT, No sclera icterus Neck-Supple Neck,No JVD,.  Lungs-  CTAB , fair symmetrical air movement CV- S1, S2 normal, irregular , tachycardic, CABG scar Abd-  +ve B.Sounds, Abd Soft, No tenderness,    Extremity/Skin:- No  edema, pedal pulses present  Psych-affect is appropriate, oriented x3 Neuro-generalized weakness, no new focal deficits, no tremors  Data Reviewed: I have personally reviewed following labs and imaging studies  CBC: Recent Labs  Lab 03/10/22 1430 03/11/22 0441 03/12/22 0225  WBC 7.0 7.4 7.1  NEUTROABS 5.2  --   --   HGB 12.7 11.2* 11.4*  HCT 36.0 32.5* 33.4*  MCV 91.6 93.1 94.6  PLT 263 258 244   Basic Metabolic Panel: Recent Labs  Lab 03/10/22 1430 03/11/22 0441 03/12/22 0225  NA 119* 128* 124*  K 2.2* 3.6 4.4  CL 69* 84* 86*  CO2 37* 36* 29  GLUCOSE 110* 89 145*  BUN 19 15 17   CREATININE 1.17* 0.99 0.95  CALCIUM 8.6* 8.4* 8.6*  MG 1.6* 1.9  --    GFR: Estimated Creatinine Clearance: 32.6 mL/min (by C-G formula based on SCr of 0.95 mg/dL). Liver Function Tests: Recent Labs  Lab 03/10/22 1430  AST 34  ALT 23  ALKPHOS 74  BILITOT 0.8  PROT 7.0  ALBUMIN 4.1   HbA1C: Recent Labs    03/11/22 0440  HGBA1C 6.6*   Recent Results (from the past 240 hour(s))  MRSA Next Gen by PCR, Nasal     Status: None   Collection Time: 03/10/22  6:37 PM   Specimen: Nasal Mucosa; Nasal Swab  Result Value Ref Range Status   MRSA by PCR Next Gen NOT DETECTED NOT DETECTED Final    Comment: (NOTE) The GeneXpert MRSA Assay (FDA approved for NASAL specimens only), is one component of a comprehensive MRSA colonization surveillance program. It is not intended to diagnose MRSA infection nor to guide or monitor treatment for MRSA  infections. Test performance is not FDA approved in patients less than 93 years old. Performed at [facility], [address], [place], [place]     Radiology Studies: CT ABDOMEN PELVIS W CONTRAST  Result Date: 03/10/2022 CLINICAL DATA:  Nausea for 5 days EXAM: CT ABDOMEN AND PELVIS WITH CONTRAST TECHNIQUE: Multidetector CT imaging of the abdomen and pelvis was performed using the standard protocol following bolus administration of intravenous contrast. RADIATION DOSE REDUCTION: This exam was performed according to the departmental dose-optimization program which includes automated exposure control, adjustment of the mA and/or kV according  to patient size and/or use of iterative reconstruction technique. CONTRAST:  23mL OMNIPAQUE IOHEXOL 300 MG/ML  SOLN COMPARISON:  None Available. FINDINGS: Lower chest: The heart is enlarged without pericardial effusion. Extensive atherosclerosis of the aorta and native coronary vessels. There are small bilateral pleural effusions, right greater than left, with minimal dependent lower lobe atelectasis. Hepatobiliary: Cholecystectomy. Liver is grossly unremarkable with no biliary duct dilation. Pancreas: Diffuse pancreatic atrophy. No inflammatory changes or pancreatic duct dilation. Spleen: Normal in size without focal abnormality. Adrenals/Urinary Tract: There is marked distension of the urinary bladder, with limited evaluation of the bladder base due to streak artifact from bilateral hip arthroplasties. No obvious filling defects. There is mild bilateral renal cortical thinning, with grossly normal enhancement. No urinary tract calculi or obstruction. The adrenals are unremarkable. Stomach/Bowel: No bowel obstruction or ileus. The appendix is not identified. No bowel wall thickening or inflammatory change. Diverticulosis of the distal colon without diverticulitis. Vascular/Lymphatic: Aortic atherosclerosis. No enlarged abdominal or pelvic lymph nodes.  Reproductive: Status post hysterectomy. No adnexal masses. Other: No free fluid or free intraperitoneal gas. No abdominal wall hernia. Musculoskeletal: Bilateral hip arthroplasties. No acute or destructive bony lesions. Reconstructed images demonstrate no additional findings. IMPRESSION: 1. Markedly distended urinary bladder. If patient is unable to void, catheter decompression could be considered. 2. Colonic diverticulosis without diverticulitis. 3. No bowel obstruction or ileus. 4. Small bilateral pleural effusions, right greater than left. 5. Cardiomegaly. 6. Aortic Atherosclerosis (ICD10-I70.0). Coronary artery atherosclerosis. Electronically Signed   By: Sharlet Salina M.D.   On: 03/10/2022 16:47     Scheduled Meds:  aspirin EC  81 mg Oral Daily   Chlorhexidine Gluconate Cloth  6 each Topical Daily   enoxaparin (LOVENOX) injection  30 mg Subcutaneous Q24H   insulin aspart  0-5 Units Subcutaneous QHS   insulin aspart  0-9 Units Subcutaneous TID WC   isosorbide dinitrate  20 mg Oral BID   levothyroxine  88 mcg Oral Q0600   metoprolol succinate  25 mg Oral Daily   tamsulosin  0.4 mg Oral QPC supper   Continuous Infusions:  0.9 % NaCl with KCl 40 mEq / L 50 mL/hr at 03/11/22 1826    LOS: 2 days   Shon Hale M.D on 03/12/2022 at 10:57 AM  Go to [email or web address] - for contact info  [facility] - Office  [phone]  If 7PM-7AM, please contact night-coverage [email or web address] 03/12/2022, 10:57 AM

## 2022-03-12 NOTE — Care Management Important Message (Signed)
Important Message  Patient Details  Name: Erin Herman MRN: [id number] Date of Birth: [birth year]   Medicare Important Message Given:  Yes     Corey Harold 03/12/2022, 3:37 PM

## 2022-03-13 ENCOUNTER — Inpatient Hospital Stay (HOSPITAL_COMMUNITY): Payer: Medicare Other

## 2022-03-13 DIAGNOSIS — E871 Hypo-osmolality and hyponatremia: Secondary | ICD-10-CM | POA: Diagnosis not present

## 2022-03-13 LAB — BLOOD GAS, ARTERIAL
Acid-Base Excess: 3.1 mmol/L — ABNORMAL HIGH (ref 0.0–2.0)
Bicarbonate: 27 mmol/L (ref 20.0–28.0)
Drawn by: 22179
FIO2: 32 %
O2 Saturation: 100 %
Patient temperature: 36.4
pCO2 arterial: 37 mmHg (ref 32–48)
pH, Arterial: 7.47 — ABNORMAL HIGH (ref 7.35–7.45)
pO2, Arterial: 135 mmHg — ABNORMAL HIGH (ref 83–108)

## 2022-03-13 LAB — RENAL FUNCTION PANEL
Albumin: 3.7 g/dL (ref 3.5–5.0)
Albumin: 3.7 g/dL (ref 3.5–5.0)
Anion gap: 10 (ref 5–15)
Anion gap: 10 (ref 5–15)
BUN: 19 mg/dL (ref 8–23)
BUN: 20 mg/dL (ref 8–23)
CO2: 25 mmol/L (ref 22–32)
CO2: 25 mmol/L (ref 22–32)
Calcium: 8.9 mg/dL (ref 8.9–10.3)
Calcium: 9 mg/dL (ref 8.9–10.3)
Chloride: 87 mmol/L — ABNORMAL LOW (ref 98–111)
Chloride: 85 mmol/L — ABNORMAL LOW (ref 98–111)
Creatinine, Ser: 0.87 mg/dL (ref 0.44–1.00)
Creatinine, Ser: 0.88 mg/dL (ref 0.44–1.00)
GFR, Estimated: >60 mL/min (ref >60)
GFR, Estimated: >60 mL/min (ref >60)
Glucose, Bld: 159 mg/dL — ABNORMAL HIGH (ref 70–99)
Glucose, Bld: 173 mg/dL — ABNORMAL HIGH (ref 70–99)
Phosphorus: 3.3 mg/dL (ref 2.5–4.6)
Phosphorus: 4 mg/dL (ref 2.5–4.6)
Potassium: 5.8 mmol/L — ABNORMAL HIGH (ref 3.5–5.1)
Potassium: 4.9 mmol/L (ref 3.5–5.1)
Sodium: 122 mmol/L — ABNORMAL LOW (ref 135–145)
Sodium: 120 mmol/L — ABNORMAL LOW (ref 135–145)

## 2022-03-13 LAB — GLUCOSE, CAPILLARY
Glucose-Capillary: 177 mg/dL — ABNORMAL HIGH (ref 70–99)
Glucose-Capillary: 239 mg/dL — ABNORMAL HIGH (ref 70–99)
Glucose-Capillary: 182 mg/dL — ABNORMAL HIGH (ref 70–99)
Glucose-Capillary: 162 mg/dL — ABNORMAL HIGH (ref 70–99)

## 2022-03-13 LAB — CBC
HCT: 36.4 % (ref 36.0–46.0)
Hemoglobin: 12.4 g/dL (ref 12.0–15.0)
MCH: 32.1 pg (ref 26.0–34.0)
MCHC: 34.1 g/dL (ref 30.0–36.0)
MCV: 94.3 fL (ref 80.0–100.0)
Platelets: 253 10*3/uL (ref 150–400)
RBC: 3.86 MIL/uL — ABNORMAL LOW (ref 3.87–5.11)
RDW: 13.6 % (ref 11.5–15.5)
WBC: 8 10*3/uL (ref 4.0–10.5)
nRBC: 0 % (ref 0.0–0.2)

## 2022-03-13 LAB — SODIUM, URINE, RANDOM: Sodium, Ur: <10 mmol/L

## 2022-03-13 MED ORDER — TAMSULOSIN HCL 0.4 MG PO CAPS
0.4000 mg | ORAL_CAPSULE | Freq: Every day | ORAL | Status: DC
  Administered 2022-03-13 – 2022-03-14 (×2): 0.4 mg via ORAL
  Filled 2022-03-13 (×2): qty 1

## 2022-03-13 MED ORDER — GLUCERNA SHAKE PO LIQD
237.0000 mL | Freq: Three times a day (TID) | ORAL | Status: DC
  Administered 2022-03-13 – 2022-03-24 (×20): 237 mL via ORAL

## 2022-03-13 MED ORDER — HYDROXYZINE HCL 10 MG PO TABS
10.0000 mg | ORAL_TABLET | Freq: Once | ORAL | Status: AC
  Administered 2022-03-13: 10 mg via ORAL
  Filled 2022-03-13: qty 1

## 2022-03-13 MED ORDER — SODIUM CHLORIDE 0.9 % IV SOLN: INTRAVENOUS | Status: DC

## 2022-03-13 MED ORDER — CALCIUM GLUCONATE-NACL 1-0.675 GM/50ML-% IV SOLN
1.0000 g | Freq: Once | INTRAVENOUS | Status: AC
  Administered 2022-03-13: 1000 mg via INTRAVENOUS
  Filled 2022-03-13: qty 50

## 2022-03-13 MED ORDER — BISACODYL 10 MG RE SUPP
10.0000 mg | Freq: Once | RECTAL | Status: AC
  Administered 2022-03-13: 10 mg via RECTAL
  Filled 2022-03-13: qty 1

## 2022-03-13 MED ORDER — ENOXAPARIN SODIUM 40 MG/0.4ML IJ SOSY
40.0000 mg | PREFILLED_SYRINGE | INTRAMUSCULAR | Status: DC
  Administered 2022-03-13 – 2022-03-14 (×2): 40 mg via SUBCUTANEOUS
  Filled 2022-03-13 (×2): qty 0.4

## 2022-03-13 MED ORDER — SODIUM ZIRCONIUM CYCLOSILICATE 10 G PO PACK
10.0000 g | PACK | Freq: Once | ORAL | Status: AC
  Administered 2022-03-13: 10 g via ORAL
  Filled 2022-03-13: qty 1

## 2022-03-13 MED ORDER — LEVALBUTEROL HCL 0.63 MG/3ML IN NEBU
0.6300 mg | INHALATION_SOLUTION | Freq: Four times a day (QID) | RESPIRATORY_TRACT | Status: DC | PRN
  Administered 2022-03-13 – 2022-03-22 (×9): 0.63 mg via RESPIRATORY_TRACT
  Filled 2022-03-13 (×8): qty 3

## 2022-03-13 MED ORDER — FUROSEMIDE 10 MG/ML IJ SOLN
20.0000 mg | Freq: Once | INTRAMUSCULAR | Status: AC
  Administered 2022-03-13: 20 mg via INTRAVENOUS
  Filled 2022-03-13: qty 2

## 2022-03-13 MED ORDER — MAGNESIUM HYDROXIDE 400 MG/5ML PO SUSP
30.0000 mL | Freq: Once | ORAL | Status: AC
  Administered 2022-03-13: 30 mL via ORAL
  Filled 2022-03-13: qty 30

## 2022-03-13 MED ORDER — SODIUM POLYSTYRENE SULFONATE 15 GM/60ML PO SUSP
30.0000 g | Freq: Once | ORAL | Status: AC
  Administered 2022-03-13: 30 g via ORAL
  Filled 2022-03-13: qty 120

## 2022-03-13 MED ORDER — ALPRAZOLAM 0.25 MG PO TABS
0.2500 mg | ORAL_TABLET | Freq: Two times a day (BID) | ORAL | Status: DC | PRN
  Administered 2022-03-13 – 2022-03-18 (×12): 0.25 mg via ORAL
  Filled 2022-03-13 (×12): qty 1

## 2022-03-13 NOTE — Progress Notes (Addendum)
PROGRESS NOTE     Erin Herman, is a 88 y.o. female, DOB - 09-05-1927, HCW:[id number]  Admit date - 03/10/2022   Admitting Physician Erin Wendall Stade, MD  Outpatient Primary MD for the patient is Erin Peri, MD  LOS - 3  Chief Complaint  Patient presents with   Nausea      Brief Narrative:   88 y.o. female with medical history significant for systolic and diastolic CHF, CABG, left bundle branch block, diabetes mellitus, hypertension admitted with symptomatic hyponatremia    -Assessment and Plan: 1) acute symptomatic hyponatremia Sodium 119 on admission.   3 months ago sodium was 139.  Baseline ~mid 130s.  -Suspect dehydration related in the setting of poor oral intake and spironolactone and Lasix use -Occasional episodes of nausea vomiting and diarrhea but not enough to explain degree of electrolyte derangement by just GI losses -Mentation improving as per patient's daughter Erin Herman who is at bedside 03/13/22 -Repeat serum osm (was 257) -Repeat urine sodium (was 144) TSH was 4.5 Uric Acid random and urine 25 -Check a.m. cortisol -May need to Tolvaptan  2)Combined systolic and diastolic congestive heart failure (HCC) Stable and compensated.  Last echo 10/2021, EF of 35%, with grade 3 DD- restrictive. -Be judicious with IV fluids -Lasix and spironolactone currently on hold given #1 above  3) chronically prolonged QT interval Prolonged at 531, chronic.  -Replace electrolytes  4)Hyperkalemia/hypomagnesemia -Normalized with Rx -Lasix and Aldactone remains on hold  5)DM2 -A1c 6.6 reflecting good diabetic control PTA -Use Novolog/Humalog Sliding scale insulin with Accu-Cheks/Fingersticks as ordered  -Continue to hold metformin  6)CAD, NATIVE VESSEL History of CABG 2006 and subsequent stent placement 2020.   -Chest pain-free, continue aspirin and metoprolol -Hold isosorbide due to soft BP  7)Essential hypertension, benign Stable. -Stop Imdur due to soft  BP,  -Continue metoprolol due to tachycardia -Hold Lasix, spironolactone for now  8) acute on chronic urinary retention--patient has chronic urinary retention, usually [facility] -Voiding difficulties persist Flomax as ordered, in and out catheterization as needed  9)Social/Ethics--- plan of care and directives discussed with Erin Herman (daughter Ms Erin Herman Ratiff) and granddaughter Erin Herman- -family will like to have further conversations amongst themselves prior to making a final decision -Full code for now -Overall prognosis is not great given advanced age, low EF and other comorbid conditions  10)Other- -There was concerns about dyspnea versus anxiety--- improved with Xanax, ABG reassuring --Okay to wean off oxygen--which was placed just for comfort not for proven hypoxia per se  Disposition/Need for in-Hospital Stay- patient unable to be discharged at this time due to -acute symptomatic hyponatremia requiring IV fluids in the setting of dehydration and diuretic use,  Status is: Inpatient   Disposition: The patient is from: Home              Anticipated d/c is to: Home              Anticipated d/c date is: 2 days              Patient currently is not medically stable to d/c. Barriers: Not Clinically Stable-   Code Status :  -  Code Status: Full Code   Family Communication:    Discussed with patient's daughter Erin Herman who is at bedside  DVT Prophylaxis  :   - SCDs   enoxaparin (LOVENOX) injection 40 mg Start: 03/13/22 2000   Lab Results  Component Value Date   PLT 253 03/13/2022  Inpatient Medications  Scheduled Meds:  aspirin EC  81 mg Oral Daily   Chlorhexidine Gluconate Cloth  6 each Topical Daily   enoxaparin (LOVENOX) injection  40 mg Subcutaneous Q24H   famotidine  20 mg Oral Daily   feeding supplement (GLUCERNA SHAKE)  237 mL Oral TID BM   insulin aspart  0-5 Units Subcutaneous QHS   insulin aspart  0-9 Units Subcutaneous TID  WC   levothyroxine  88 mcg Oral Q0600   metoprolol succinate  25 mg Oral Daily   tamsulosin  0.4 mg Oral QPC supper   Continuous Infusions:  sodium chloride 40 mL/hr at 03/13/22 1800   PRN Meds:.acetaminophen **OR** acetaminophen, ALPRAZolam, levalbuterol, polyethylene glycol, promethazine   Anti-infectives (From admission, onward)    None         Subjective: Erin Herman today has no fevers, no emesis,  No chest pain,   -BP appears to be soft -Denies dizziness -Complains of indigestion after eating, denies chest pains---improved with Maalox, had BM with MiraLAX also -Voiding difficulties persist Daughter Erin Herman and Erin Herman are at bedside -There was concerns about dyspnea versus anxiety--- improved with Xanax, ABG reassuring --Okay to wean off oxygen--which was placed just for comfort not for proven hypoxia per se   Objective: Vitals:   03/13/22 1500 03/13/22 1600 03/13/22 1700 03/13/22 1800  BP: (!) 140/89 (!) 158/91 (!) 145/101 (!) 131/101  Pulse: (!) 110 (!) 101 (!) 108 (!) 114  Resp: (!) 24 (!) 25 (!) 28 (!) 31  Temp:   (!) 97.3 F (36.3 C)   TempSrc:   Axillary   SpO2: 94% 95% 98% 98%  Weight:      Height:        Intake/Output Summary (Last 24 hours) at 03/13/2022 1908 Last data filed at 03/13/2022 1800 Gross per 24 hour  Intake 1594.66 ml  Output 350 ml  Net 1244.66 ml   Filed Weights   03/10/22 1311 03/10/22 1841 03/13/22 0459  Weight: 56.2 kg 57.8 kg 57.9 kg   Physical Exam  Gen:- Awake Alert,  in no apparent distress  HEENT:- [place].AT, No sclera icterus Neck-Supple Neck,No JVD,.  Lungs-  CTAB , fair symmetrical air movement CV- S1, S2 normal, irregular , tachycardic, CABG scar Abd-  +ve B.Sounds, Abd Soft, No tenderness,    Extremity/Skin:- No  edema, pedal pulses present  Psych-affect is appropriate, oriented x3 Neuro-generalized weakness, no new focal deficits, no tremors  Data Reviewed: I have personally reviewed  following labs and imaging studies  CBC: Recent Labs  Lab 03/10/22 1430 03/11/22 0441 03/12/22 0225 03/13/22 0355  WBC 7.0 7.4 7.1 8.0  NEUTROABS 5.2  --   --   --   HGB 12.7 11.2* 11.4* 12.4  HCT 36.0 32.5* 33.4* 36.4  MCV 91.6 93.1 94.6 94.3  PLT 263 258 244 253   Basic Metabolic Panel: Recent Labs  Lab 03/10/22 1430 03/11/22 0441 03/12/22 0225 03/12/22 1903 03/13/22 0355 03/13/22 1548  NA 119* 128* 124* 122* 122* 120*  K 2.2* 3.6 4.4 5.3* 5.8* 4.9  CL 69* 84* 86* 87* 87* 85*  CO2 37* 36* 29 27 25 25   GLUCOSE 110* 89 145* 179* 159* 173*  BUN 19 15 17 17 19 20   CREATININE 1.17* 0.99 0.95 0.98 0.87 0.88  CALCIUM 8.6* 8.4* 8.6* 9.0 8.9 9.0  MG 1.6* 1.9  --   --   --   --   PHOS  --   --   --   --  3.3 4.0   GFR: Estimated Creatinine Clearance: 35.2 mL/min (by C-G formula based on SCr of 0.88 mg/dL). Liver Function Tests: Recent Labs  Lab 03/10/22 1430 03/13/22 0355 03/13/22 1548  AST 34  --   --   ALT 23  --   --   ALKPHOS 74  --   --   BILITOT 0.8  --   --   PROT 7.0  --   --   ALBUMIN 4.1 3.7 3.7   HbA1C: Recent Labs    03/11/22 0440  HGBA1C 6.6*   Recent Results (from the past 240 hour(s))  MRSA Next Gen by PCR, Nasal     Status: None   Collection Time: 03/10/22  6:37 PM   Specimen: Nasal Mucosa; Nasal Swab  Result Value Ref Range Status   MRSA by PCR Next Gen NOT DETECTED NOT DETECTED Final    Comment: (NOTE) The GeneXpert MRSA Assay (FDA approved for NASAL specimens only), is one component of a comprehensive MRSA colonization surveillance program. It is not intended to diagnose MRSA infection nor to guide or monitor treatment for MRSA infections. Test performance is not FDA approved in patients less than 39 years old. Performed at [facility], [address], [place], [place] [address]     Radiology Studies: No results found.   Scheduled Meds:  aspirin EC  81 mg Oral Daily   Chlorhexidine Gluconate Cloth  6 each Topical Daily    enoxaparin (LOVENOX) injection  40 mg Subcutaneous Q24H   famotidine  20 mg Oral Daily   feeding supplement (GLUCERNA SHAKE)  237 mL Oral TID BM   insulin aspart  0-5 Units Subcutaneous QHS   insulin aspart  0-9 Units Subcutaneous TID WC   levothyroxine  88 mcg Oral Q0600   metoprolol succinate  25 mg Oral Daily   tamsulosin  0.4 mg Oral QPC supper   Continuous Infusions:  sodium chloride 40 mL/hr at 03/13/22 1800    LOS: 3 days   Shon Hale M.D on 03/13/2022 at 7:08 PM  Go to [email or web address] - for contact info  [facility] - Office  [phone]  If 7PM-7AM, please contact night-coverage [email or web address] 03/13/2022, 7:08 PM

## 2022-03-13 NOTE — Progress Notes (Signed)
Sent secure chat to Dr. Marisa Severin and made MD aware that patient has not voided during shift and that bladder scan result is 199 ml and denies needing to void. MD saw secure chat(indicated by seen) but did not give any orders.

## 2022-03-13 NOTE — Progress Notes (Signed)
Spoke with Dr. Marisa Severin over the phone and made MD aware that patient is short of breath with some work of breathing with o2 sats 100% on 2L and daughter feels like patient looks blue around her lips. Patient alert and anxious, BP 172/96 and heart rate 1 teens on monitor. Patient denies chest pain but still complains of feeling full. MD ordered ABG and calcium gluconate. No further orders.

## 2022-03-13 NOTE — Progress Notes (Signed)
Pt RR in the 30's, pt can only say 1-2 word sentences and has audible wheezing, RT notified for breathing txt, notified Dr. Thomes Dinning to maybe do CXR now instead of waiting until 0600.  CXR: IMPRESSION: 1. Cardiomegaly with central pulmonary vascular congestion and new small bilateral pleural effusions. 2. Patchy bibasilar opacities may reflect atelectasis or infection.  Notified Dr. Thomes Dinning of CXR result.

## 2022-03-13 NOTE — Progress Notes (Signed)
Sent Dr. Marisa Severin secure chat and made MD aware that patient's diastolic pressure is staying a little elevated 91-106 and also made MD aware that patient has not voided and that nurse will bladder scan her again if she hasn't voided by 1730. MD acknowledged and gave order to in and out cath if bladder scan result is 500 ml or more.

## 2022-03-14 DIAGNOSIS — E871 Hypo-osmolality and hyponatremia: Secondary | ICD-10-CM | POA: Diagnosis not present

## 2022-03-14 LAB — RENAL FUNCTION PANEL
Albumin: 3.4 g/dL — ABNORMAL LOW (ref 3.5–5.0)
Anion gap: 9 (ref 5–15)
BUN: 23 mg/dL (ref 8–23)
CO2: 26 mmol/L (ref 22–32)
Calcium: 8.6 mg/dL — ABNORMAL LOW (ref 8.9–10.3)
Chloride: 88 mmol/L — ABNORMAL LOW (ref 98–111)
Creatinine, Ser: 0.84 mg/dL (ref 0.44–1.00)
GFR, Estimated: >60 mL/min (ref >60)
Glucose, Bld: 135 mg/dL — ABNORMAL HIGH (ref 70–99)
Phosphorus: 4.4 mg/dL (ref 2.5–4.6)
Potassium: 4.6 mmol/L (ref 3.5–5.1)
Sodium: 123 mmol/L — ABNORMAL LOW (ref 135–145)

## 2022-03-14 LAB — GLUCOSE, CAPILLARY
Glucose-Capillary: 144 mg/dL — ABNORMAL HIGH (ref 70–99)
Glucose-Capillary: 166 mg/dL — ABNORMAL HIGH (ref 70–99)
Glucose-Capillary: 134 mg/dL — ABNORMAL HIGH (ref 70–99)
Glucose-Capillary: 141 mg/dL — ABNORMAL HIGH (ref 70–99)

## 2022-03-14 LAB — OSMOLALITY: Osmolality: 265 mOsm/kg — ABNORMAL LOW (ref 275–295)

## 2022-03-14 LAB — CBC
HCT: 34.3 % — ABNORMAL LOW (ref 36.0–46.0)
Hemoglobin: 11.9 g/dL — ABNORMAL LOW (ref 12.0–15.0)
MCH: 32.5 pg (ref 26.0–34.0)
MCHC: 34.7 g/dL (ref 30.0–36.0)
MCV: 93.7 fL (ref 80.0–100.0)
Platelets: 242 10*3/uL (ref 150–400)
RBC: 3.66 MIL/uL — ABNORMAL LOW (ref 3.87–5.11)
RDW: 13.5 % (ref 11.5–15.5)
WBC: 7.9 10*3/uL (ref 4.0–10.5)
nRBC: 0 % (ref 0.0–0.2)

## 2022-03-14 LAB — HEPATIC FUNCTION PANEL
ALT: 103 U/L — ABNORMAL HIGH (ref 0–44)
AST: 101 U/L — ABNORMAL HIGH (ref 15–41)
Albumin: 3.6 g/dL (ref 3.5–5.0)
Alkaline Phosphatase: 145 U/L — ABNORMAL HIGH (ref 38–126)
Bilirubin, Direct: 0.2 mg/dL (ref 0.0–0.2)
Indirect Bilirubin: 0.5 mg/dL (ref 0.3–0.9)
Total Bilirubin: 0.7 mg/dL (ref 0.3–1.2)
Total Protein: 6.3 g/dL — ABNORMAL LOW (ref 6.5–8.1)

## 2022-03-14 LAB — CORTISOL-AM, BLOOD: Cortisol - AM: 14.6 ug/dL (ref 6.7–22.6)

## 2022-03-14 LAB — SODIUM: Sodium: 121 mmol/L — ABNORMAL LOW (ref 135–145)

## 2022-03-14 LAB — OSMOLALITY, URINE: Osmolality, Ur: 426 mOsm/kg (ref 300–900)

## 2022-03-14 MED ORDER — IPRATROPIUM BROMIDE 0.02 % IN SOLN
RESPIRATORY_TRACT | Status: AC
  Administered 2022-03-14: 0.5 mg
  Filled 2022-03-14: qty 2.5

## 2022-03-14 MED ORDER — FUROSEMIDE 10 MG/ML IJ SOLN
20.0000 mg | Freq: Two times a day (BID) | INTRAMUSCULAR | Status: DC
  Administered 2022-03-14: 20 mg via INTRAVENOUS
  Filled 2022-03-14: qty 2

## 2022-03-14 MED ORDER — TOLVAPTAN 15 MG PO TABS
15.0000 mg | ORAL_TABLET | Freq: Once | ORAL | Status: AC
  Administered 2022-03-14: 15 mg via ORAL
  Filled 2022-03-14: qty 1

## 2022-03-14 MED ORDER — FUROSEMIDE 10 MG/ML IJ SOLN: 20.0000 mg | Freq: Every day | INTRAMUSCULAR | Status: DC

## 2022-03-14 NOTE — Progress Notes (Signed)
Did standing wt for pt 65kg, per daughter that is a 16lb weight gain. Pt still has some accessory muscle use, JVD and pitting edema. Pt did feel slightly better after 20mg  lasix.

## 2022-03-14 NOTE — Progress Notes (Signed)
Nurse tech attempted to take temperature in several area of patients body but could not get a reading. She asked me to try to get a temperature and I also failed. Suggested to patient that we may have to get a rectal temp but I also failed.

## 2022-03-14 NOTE — Progress Notes (Signed)
Patient ambulated 55ft, she was weak but did well. Phlebotomy called to draw 1800 labs, states they are on their way.

## 2022-03-14 NOTE — Progress Notes (Signed)
PROGRESS NOTE     Erin Herman, is a 88 y.o. female, DOB - 03-23-1928, YQI:[id number]  Admit date - 03/10/2022   Admitting Physician Erin Wendall Stade, MD  Outpatient Primary MD for the patient is Erin Peri, MD  LOS - 4  Chief Complaint  Patient presents with   Nausea      Brief Narrative:   88 y.o. female with medical history significant for systolic and diastolic CHF, CABG, left bundle branch block, diabetes mellitus, hypertension admitted with symptomatic hyponatremia    -Assessment and Plan: 1) acute symptomatic hyponatremia Sodium 119 on admission.   3 months ago sodium was 139.  Baseline ~mid 130s.  -Suspect dehydration related in the setting of poor oral intake and spironolactone and Lasix use -Prior to admission patient had occasional episodes of nausea vomiting and diarrhea but not enough to explain degree of electrolyte derangement by just GI losses -Mentation improved back to baseline as per patient's daughter Erin Herman who is at bedside 03/14/22 -Repeat serum osm (was 257 on 03/10/2022) -Repeat urine sodium osmolarity (was 144 on 03/10/2022) TSH was 4.5 Uric Acid random and urine is 25 -Random sodium urine < 10 (it was 11 on 03/10/2022) -a.m. cortisol 14.6 -After IV fluids patient is no longer dehydrated, actually concerns about possible volume overload at this time - okay to give Tolvaptan 15 mg x 1, follow protocol  2)Combined systolic and diastolic congestive heart failure (HCC) Stable and compensated.  Last echo 10/2021, EF of 35%, with grade 3 DD- restrictive. -03/14/22 -Wt gain and Volume overload concerns in the setting of NSAID use for symptomatic hyponatremia -Patient received about 10x1 fluids as above #1 -Give low-dose IV Lasix -Continue to hold Aldactone  3) chronically prolonged QT interval Prolonged at 531, chronic.  -Replace electrolytes  4)Hyperkalemia/hypomagnesemia -Normalized with Rx -Lasix and Aldactone remains on  hold  5)DM2 -A1c 6.6 reflecting good diabetic control PTA -Use Novolog/Humalog Sliding scale insulin with Accu-Cheks/Fingersticks as ordered  -Continue to hold metformin  6)CAD, NATIVE VESSEL History of CABG 2006 and subsequent stent placement 2020.   -Chest pain-free, continue aspirin and metoprolol -Hold isosorbide due to soft BP  7)Essential hypertension, benign Stable. -Stop Imdur due to soft BP,  -Continue metoprolol due to tachycardia -Hold Lasix, spironolactone for now  8) acute on chronic urinary retention--patient has chronic urinary retention, usually [facility]rogynecologist at [facility] -Voiding difficulties persist Flomax as ordered, in and out catheterization as needed  9)Social/Ethics--- plan of care and directives discussed with Erin Herman (daughter Ms Erin Herman) and granddaughter Erin Herman- -family will like to have further conversations amongst themselves prior to making a final decision -Full code for now -Overall prognosis is not great given advanced age, low EF and other comorbid conditions  10) anxiety disorder --- Zoloft on hold given hyponatremia  -May use Xanax as needed   Disposition/Need for in-Hospital Stay- patient unable to be discharged at this time due to -acute symptomatic hyponatremia requiring serial BMP/sodium checks c use,  Status is: Inpatient   Disposition: The patient is from: Home              Anticipated d/c is to: Home              Anticipated d/c date is: 2 days              Patient currently is not medically stable to d/c. Barriers: Not Clinically Stable-   Code Status :  -  Code Status: Full Code   Family  Communication:    Discussed with patient's daughter Erin Herman who is at bedside  DVT Prophylaxis  :   - SCDs   enoxaparin (LOVENOX) injection 40 mg Start: 03/13/22 2000   Lab Results  Component Value Date   PLT 242 03/14/2022   Inpatient Medications  Scheduled Meds:  aspirin EC  81 mg Oral Daily    Chlorhexidine Gluconate Cloth  6 each Topical Daily   enoxaparin (LOVENOX) injection  40 mg Subcutaneous Q24H   famotidine  20 mg Oral Daily   feeding supplement (GLUCERNA SHAKE)  237 mL Oral TID BM   [START ON 03/15/2022] furosemide  20 mg Intravenous Daily   insulin aspart  0-5 Units Subcutaneous QHS   insulin aspart  0-9 Units Subcutaneous TID WC   levothyroxine  88 mcg Oral Q0600   metoprolol succinate  25 mg Oral Daily   tamsulosin  0.4 mg Oral QPC supper   Continuous Infusions:  PRN Meds:.acetaminophen **OR** acetaminophen, ALPRAZolam, levalbuterol, polyethylene glycol, promethazine   Anti-infectives (From admission, onward)    None       Subjective: Erin Herman today has no fevers, no emesis,  No chest pain,   - 03/14/22 -Daughter Erin Herman and granddaughter Erin Herman at bedside -Voiding better -Had BM -Nausea and reflux symptoms from time to time -Weaned off oxygen--- O2 sats 94 to 96% on room air  Objective: Vitals:   03/14/22 1000 03/14/22 1100 03/14/22 1145 03/14/22 1635  BP: (!) 114/57 118/88    Pulse: (!) 35 (!) 109  (!) 108  Resp: 18 (!) 26 (!) 22 (!) 26  Temp:    (!) 97.5 F (36.4 C)  TempSrc:    Oral  SpO2: 100% 98%  96%  Weight:      Height:        Intake/Output Summary (Last 24 hours) at 03/14/2022 1734 Last data filed at 03/14/2022 0800 Gross per 24 hour  Intake 1292.93 ml  Output 350 ml  Net 942.93 ml   Filed Weights   03/10/22 1841 03/13/22 0459 03/14/22 0652  Weight: 57.8 kg 57.9 kg 65 kg   Physical Exam  Gen:- Awake Alert,  in no apparent distress  HEENT:- [place].AT, No sclera icterus Neck-Supple Neck,No JVD,.  Lungs-  CTAB , fair symmetrical air movement CV- S1, S2 normal, irregular , tachycardic, CABG scar Abd-  +ve B.Sounds, Abd Soft, No tenderness,    Extremity/Skin:- No  edema, pedal pulses present  Psych-affect is appropriate, oriented x3 Neuro-generalized weakness, no new focal deficits, no tremors  Data Reviewed: I have  personally reviewed following labs and imaging studies  CBC: Recent Labs  Lab 03/10/22 1430 03/11/22 0441 03/12/22 0225 03/13/22 0355 03/14/22 0430  WBC 7.0 7.4 7.1 8.0 7.9  NEUTROABS 5.2  --   --   --   --   HGB 12.7 11.2* 11.4* 12.4 11.9*  HCT 36.0 32.5* 33.4* 36.4 34.3*  MCV 91.6 93.1 94.6 94.3 93.7  PLT 263 258 244 253 242   Basic Metabolic Panel: Recent Labs  Lab 03/10/22 1430 03/11/22 0441 03/12/22 0225 03/12/22 1903 03/13/22 0355 03/13/22 1548 03/14/22 0430  NA 119* 128* 124* 122* 122* 120* 123*  K 2.2* 3.6 4.4 5.3* 5.8* 4.9 4.6  CL 69* 84* 86* 87* 87* 85* 88*  CO2 37* 36* 29 27 25 25 26   GLUCOSE 110* 89 145* 179* 159* 173* 135*  BUN 19 15 17 17 19 20 23   CREATININE 1.17* 0.99 0.95 0.98 0.87 0.88 0.84  CALCIUM 8.6* 8.4*  8.6* 9.0 8.9 9.0 8.6*  MG 1.6* 1.9  --   --   --   --   --   PHOS  --   --   --   --  3.3 4.0 4.4   GFR: Estimated Creatinine Clearance: 36.9 mL/min (by C-G formula based on SCr of 0.84 mg/dL). Liver Function Tests: Recent Labs  Lab 03/10/22 1430 03/13/22 0355 03/13/22 1548 03/14/22 0430  AST 34  --   --   --   ALT 23  --   --   --   ALKPHOS 74  --   --   --   BILITOT 0.8  --   --   --   PROT 7.0  --   --   --   ALBUMIN 4.1 3.7 3.7 3.4*   Recent Results (from the past 240 hour(s))  MRSA Next Gen by PCR, Nasal     Status: None   Collection Time: 03/10/22  6:37 PM   Specimen: Nasal Mucosa; Nasal Swab  Result Value Ref Range Status   MRSA by PCR Next Gen NOT DETECTED NOT DETECTED Final    Comment: (NOTE) The GeneXpert MRSA Assay (FDA approved for NASAL specimens only), is one component of a comprehensive MRSA colonization surveillance program. It is not intended to diagnose MRSA infection nor to guide or monitor treatment for MRSA infections. Test performance is not FDA approved in patients less than 48 years old. Performed at [facility], [address] [place], [place] [address]     Radiology Studies: DG CHEST PORT 1  VIEW  Result Date: 03/13/2022 CLINICAL DATA:  Dyspnea. EXAM: PORTABLE CHEST 1 VIEW COMPARISON:  Chest x-ray 11/22/2021 FINDINGS: Heart is enlarged. Patient is status post cardiac surgery. There are new small bilateral pleural effusions. There central pulmonary vascular congestion. Patchy bibasilar opacities are present. There is no pneumothorax or acute fracture. Surgical clips overlie the left neck, unchanged. IMPRESSION: 1. Cardiomegaly with central pulmonary vascular congestion and new small bilateral pleural effusions. 2. Patchy bibasilar opacities may reflect atelectasis or infection. Electronically Signed   By: Darliss Cheney M.D.   On: 03/13/2022 23:19    Scheduled Meds:  aspirin EC  81 mg Oral Daily   Chlorhexidine Gluconate Cloth  6 each Topical Daily   enoxaparin (LOVENOX) injection  40 mg Subcutaneous Q24H   famotidine  20 mg Oral Daily   feeding supplement (GLUCERNA SHAKE)  237 mL Oral TID BM   [START ON 03/15/2022] furosemide  20 mg Intravenous Daily   insulin aspart  0-5 Units Subcutaneous QHS   insulin aspart  0-9 Units Subcutaneous TID WC   levothyroxine  88 mcg Oral Q0600   metoprolol succinate  25 mg Oral Daily   tamsulosin  0.4 mg Oral QPC supper   Continuous Infusions:   LOS: 4 days   Shon Hale M.D on 03/14/2022 at 5:34 PM  Go to [email or web address] - for contact info  [facility] - Office  [phone]  If 7PM-7AM, please contact night-coverage [email or web address] 03/14/2022, 5:34 PM

## 2022-03-15 DIAGNOSIS — E871 Hypo-osmolality and hyponatremia: Secondary | ICD-10-CM | POA: Diagnosis not present

## 2022-03-15 LAB — BASIC METABOLIC PANEL
Anion gap: 9 (ref 5–15)
Anion gap: 11 (ref 5–15)
BUN: 32 mg/dL — ABNORMAL HIGH (ref 8–23)
BUN: 32 mg/dL — ABNORMAL HIGH (ref 8–23)
CO2: 29 mmol/L (ref 22–32)
CO2: 27 mmol/L (ref 22–32)
Calcium: 8.5 mg/dL — ABNORMAL LOW (ref 8.9–10.3)
Calcium: 8.4 mg/dL — ABNORMAL LOW (ref 8.9–10.3)
Chloride: 84 mmol/L — ABNORMAL LOW (ref 98–111)
Chloride: 86 mmol/L — ABNORMAL LOW (ref 98–111)
Creatinine, Ser: 1.17 mg/dL — ABNORMAL HIGH (ref 0.44–1.00)
Creatinine, Ser: 1.07 mg/dL — ABNORMAL HIGH (ref 0.44–1.00)
GFR, Estimated: 44 mL/min — ABNORMAL LOW (ref >60)
GFR, Estimated: 49 mL/min — ABNORMAL LOW (ref >60)
Glucose, Bld: 163 mg/dL — ABNORMAL HIGH (ref 70–99)
Glucose, Bld: 134 mg/dL — ABNORMAL HIGH (ref 70–99)
Potassium: 4.6 mmol/L (ref 3.5–5.1)
Potassium: 3.9 mmol/L (ref 3.5–5.1)
Sodium: 122 mmol/L — ABNORMAL LOW (ref 135–145)
Sodium: 124 mmol/L — ABNORMAL LOW (ref 135–145)

## 2022-03-15 LAB — COMPREHENSIVE METABOLIC PANEL
ALT: 101 U/L — ABNORMAL HIGH (ref 0–44)
AST: 90 U/L — ABNORMAL HIGH (ref 15–41)
Albumin: 3.3 g/dL — ABNORMAL LOW (ref 3.5–5.0)
Alkaline Phosphatase: 129 U/L — ABNORMAL HIGH (ref 38–126)
Anion gap: 10 (ref 5–15)
BUN: 31 mg/dL — ABNORMAL HIGH (ref 8–23)
CO2: 26 mmol/L (ref 22–32)
Calcium: 8.4 mg/dL — ABNORMAL LOW (ref 8.9–10.3)
Chloride: 86 mmol/L — ABNORMAL LOW (ref 98–111)
Creatinine, Ser: 0.98 mg/dL (ref 0.44–1.00)
GFR, Estimated: 54 mL/min — ABNORMAL LOW (ref >60)
Glucose, Bld: 120 mg/dL — ABNORMAL HIGH (ref 70–99)
Potassium: 3.9 mmol/L (ref 3.5–5.1)
Sodium: 122 mmol/L — ABNORMAL LOW (ref 135–145)
Total Bilirubin: 0.9 mg/dL (ref 0.3–1.2)
Total Protein: 5.9 g/dL — ABNORMAL LOW (ref 6.5–8.1)

## 2022-03-15 LAB — GLUCOSE, CAPILLARY
Glucose-Capillary: 113 mg/dL — ABNORMAL HIGH (ref 70–99)
Glucose-Capillary: 162 mg/dL — ABNORMAL HIGH (ref 70–99)
Glucose-Capillary: 130 mg/dL — ABNORMAL HIGH (ref 70–99)
Glucose-Capillary: 147 mg/dL — ABNORMAL HIGH (ref 70–99)

## 2022-03-15 LAB — SODIUM: Sodium: 121 mmol/L — ABNORMAL LOW (ref 135–145)

## 2022-03-15 MED ORDER — LEVOTHYROXINE SODIUM 100 MCG PO TABS
100.0000 ug | ORAL_TABLET | Freq: Every day | ORAL | Status: DC
  Administered 2022-03-16 – 2022-03-30 (×15): 100 ug via ORAL
  Filled 2022-03-15 (×16): qty 1

## 2022-03-15 MED ORDER — FUROSEMIDE 10 MG/ML IJ SOLN
40.0000 mg | Freq: Four times a day (QID) | INTRAMUSCULAR | Status: DC
  Administered 2022-03-15 (×3): 40 mg via INTRAVENOUS
  Filled 2022-03-15 (×3): qty 4

## 2022-03-15 MED ORDER — SPIRONOLACTONE 12.5 MG HALF TABLET
12.5000 mg | ORAL_TABLET | Freq: Every day | ORAL | Status: DC
  Administered 2022-03-15 – 2022-03-21 (×6): 12.5 mg via ORAL
  Filled 2022-03-15 (×7): qty 1

## 2022-03-15 MED ORDER — ENOXAPARIN SODIUM 30 MG/0.3ML IJ SOSY
30.0000 mg | PREFILLED_SYRINGE | INTRAMUSCULAR | Status: DC
  Administered 2022-03-15 – 2022-03-26 (×12): 30 mg via SUBCUTANEOUS
  Filled 2022-03-15 (×13): qty 0.3

## 2022-03-15 MED ORDER — METOPROLOL SUCCINATE ER 25 MG PO TB24
12.5000 mg | ORAL_TABLET | Freq: Every day | ORAL | Status: DC
  Administered 2022-03-15 – 2022-03-30 (×12): 12.5 mg via ORAL
  Filled 2022-03-15 (×15): qty 1

## 2022-03-15 MED ORDER — SODIUM CHLORIDE 0.9 % IV BOLUS
250.0000 mL | Freq: Once | INTRAVENOUS | Status: AC
  Administered 2022-03-15: 250 mL via INTRAVENOUS

## 2022-03-15 NOTE — Consult Note (Signed)
Reason for Consult:hyponatremia Referring Physician: [name] is an 88 y.o. female with past medical history significant for DM, HTN, CAD s/p CABG, biventricular CHF brought to the ER on 11/15 with c/o nausea/vomiting and confusion for the previous 5 days-  sodium was found to be 119 where it had been 139 in late July -  but has been in the 130-135 range and once 127 in the past.  Initially felt to be dry-  given IVF-  transiently up to 129 ( wonder if was error)  then steadily down the next 36 hours to 120-  when was 121 on 11/19 -  urine osm inappropriately low so decision was made to give samsca 15 mg times one- sodium only 122 this AM.  Other work up-  tsh a little high, cortisol was WNL.  Appears to be over 2 liters positive this hosp.  Family is at bedside-  says pt has gained 19 pounds since admission-  actually appears to be 23 but weights in hospital are always suspect-  is overloaded by exam.  She is resting-  not eating because she feels full   Trend in Creatinine: Creatinine, Ser  Date/Time Value Ref Range Status  03/15/2022 05:12 AM 0.98 0.44 - 1.00 mg/dL Final  41/66/0630 16:01 AM 0.84 0.44 - 1.00 mg/dL Final  09/32/3557 32:20 PM 0.88 0.44 - 1.00 mg/dL Final  25/42/7062 37:62 AM 0.87 0.44 - 1.00 mg/dL Final  83/15/1761 60:73 PM 0.98 0.44 - 1.00 mg/dL Final  71/09/2692 85:46 AM 0.95 0.44 - 1.00 mg/dL Final  27/06/5007 38:18 AM 0.99 0.44 - 1.00 mg/dL Final  29/93/7169 67:89 PM 1.17 (H) 0.44 - 1.00 mg/dL Final  38/01/1750 02:58 AM 1.12 (H) 0.44 - 1.00 mg/dL Final  52/77/8242 35:36 PM 1.18 (H) 0.44 - 1.00 mg/dL Final  14/43/1540 08:67 AM 1.28 (H) 0.44 - 1.00 mg/dL Final  61/95/0932 67:12 AM 1.25 (H) 0.44 - 1.00 mg/dL Final  45/80/9983 38:25 PM 0.82 0.44 - 1.00 mg/dL Final  05/39/7673 41:93 AM 0.82 0.44 - 1.00 mg/dL Final  79/05/4095 35:32 PM 1.03 (H) 0.44 - 1.00 mg/dL Final  99/24/2683 41:96 AM 1.06 (H) 0.44 - 1.00 mg/dL Final  22/29/7989 21:19 AM 1.02 (H) 0.44  - 1.00 mg/dL Final  41/74/0814 48:18 AM 1.02 (H) 0.44 - 1.00 mg/dL Final  56/31/4970 26:37 AM 1.03 (H) 0.44 - 1.00 mg/dL Final  85/88/5027 74:12 PM 1.04 (H) 0.44 - 1.00 mg/dL Final  87/86/7672 09:47 AM 0.98 0.44 - 1.00 mg/dL Final  09/62/8366 29:47 AM 1.02 (H) 0.44 - 1.00 mg/dL Final  65/46/5035 46:56 AM 0.99 0.44 - 1.00 mg/dL Final  81/27/5170 01:74 AM 1.12 (H) 0.44 - 1.00 mg/dL Final  94/49/6759 16:38 PM 1.66 (H) 0.44 - 1.00 mg/dL Final  46/65/9935 70:17 AM 1.40 (H) 0.50 - 1.10 mg/dL Final  79/39/0300 92:33 AM 1.18 (H) 0.50 - 1.10 mg/dL Final  00/76/2263 33:54 PM 1.24 (H) 0.50 - 1.10 mg/dL Final  56/25/6389 37:34 PM 1.14 (H) 0.50 - 1.10 mg/dL Final  28/76/8115 72:62 AM 1.20 (H) 0.50 - 1.10 mg/dL Final  03/55/9741 63:84 PM 1.0 0.4 - 1.2 mg/dL Final   Sodium  Date/Time Value Ref Range Status  03/15/2022 05:12 AM 122 (L) 135 - 145 mmol/L Final  03/15/2022 01:55 AM 121 (L) 135 - 145 mmol/L Final    Comment:    Performed at [facility], [address], [place], [place] [address]  03/14/2022 06:39 PM 121 (L) 135 - 145 mmol/L Final  Comment:    Performed at [facility], [address], [place], [place] [address]  03/14/2022 04:30 AM 123 (L) 135 - 145 mmol/L Final  03/13/2022 03:48 PM 120 (L) 135 - 145 mmol/L Final  03/13/2022 03:55 AM 122 (L) 135 - 145 mmol/L Final  03/12/2022 07:03 PM 122 (L) 135 - 145 mmol/L Final  03/12/2022 02:25 AM 124 (L) 135 - 145 mmol/L Final  03/11/2022 04:41 AM 128 (L) 135 - 145 mmol/L Final    Comment:    DELTA CHECK NOTED  03/10/2022 02:30 PM 119 (LL) 135 - 145 mmol/L Final    Comment:    CRITICAL RESULT CALLED TO, READ BACK BY AND VERIFIED WITH: CRABTREE,B ON 03/10/22 AT 1610 BY LOY,C   11/23/2021 06:07 AM 139 135 - 145 mmol/L Final  11/22/2021 09:19 PM 137 135 - 145 mmol/L Final  08/09/2021 04:35 AM 130 (L) 135 - 145 mmol/L Final  07/30/2021 05:44 AM 137 135 - 145 mmol/L Final  07/28/2021 09:53 PM 134 (L) 135 - 145 mmol/L Final  12/30/2020 05:36  AM 134 (L) 135 - 145 mmol/L Final    Comment:    DELTA CHECK NOTED  12/29/2020 02:27 PM 127 (L) 135 - 145 mmol/L Final  11/08/2020 06:40 AM 136 135 - 145 mmol/L Final  11/07/2020 05:40 AM 134 (L) 135 - 145 mmol/L Final  11/06/2020 08:51 AM 135 135 - 145 mmol/L Final  04/30/2020 04:38 AM 132 (L) 135 - 145 mmol/L Final  04/23/2020 02:39 PM 136 135 - 145 mmol/L Final  11/07/2018 06:20 AM 138 135 - 145 mmol/L Final  11/06/2018 10:52 AM 138 135 - 145 mmol/L Final  11/05/2018 04:19 AM 137 135 - 145 mmol/L Final  03/02/2017 05:51 AM 133 (L) 135 - 145 mmol/L Final  02/23/2017 02:01 PM 135 135 - 145 mmol/L Final  05/22/2014 05:42 AM 130 (L) 135 - 145 mmol/L Final  05/21/2014 04:51 AM 134 (L) 135 - 145 mmol/L Final  05/07/2014 01:12 PM 136 135 - 145 mmol/L Final    Comment:    Please note change in reference range.  07/11/2013 03:23 PM 137 137 - 147 mEq/L Final  08/17/2011 10:25 AM 137 135 - 145 mEq/L Final  01/30/2008 12:55 PM 138 135 - 145 meq/L Final   PMH:   Past Medical History:  Diagnosis Date   Anemia    iron   Anxiety    Aortic stenosis    Bioprosthetic AVR 2006   CAD (coronary artery disease), native coronary artery    Multivessel status post CABG 2006, occluded SVG to circumflex, DES to circumflex July 2020   Cardiomyopathy [facility])    Chronic kidney disease    Depression    Dysphagia    Essential hypertension    Family history of adverse reaction to anesthesia    N&V   GERD (gastroesophageal reflux disease)    Headache    History of hiatal hernia    HOH (hard of hearing)    LBBB (left bundle branch block)    Mixed hyperlipidemia    NSTEMI (non-ST elevated myocardial infarction) [facility])    July 2020   Osteoarthritis    Postoperative nausea and vomiting    Morphine   Type 2 diabetes mellitus (HCC)    UTI (lower urinary tract infection)     PSH:   Past Surgical History:  Procedure Laterality Date   ABDOMINAL HYSTERECTOMY     partial   AORTIC VALVE REPLACEMENT   2006   Dr. Roxy Manns -  23 mm [place] stentless cadaver valve   CARDIAC CATHETERIZATION  2020   with stents   CHOLECYSTECTOMY     CORONARY ARTERY BYPASS GRAFT  2006    Dr. Roxy Manns - LIMA to LAD, SVG to circumflex   CORONARY STENT INTERVENTION N/A 11/06/2018   Procedure: CORONARY STENT INTERVENTION;  Surgeon: Nelva Bush, MD;  Location: [place];  Service: Cardiovascular;  Laterality: N/A;   CORONARY/GRAFT ANGIOGRAPHY N/A 11/06/2018   Procedure: CORONARY/GRAFT ANGIOGRAPHY;  Surgeon: Nelva Bush, MD;  Location: [place] [facility];  Service: Cardiovascular;  Laterality: N/A;   LEFT HEART CATH AND CORS/GRAFTS ANGIOGRAPHY N/A 11/07/2020   Procedure: LEFT HEART CATH AND CORS/GRAFTS ANGIOGRAPHY;  Surgeon: Burnell Blanks, MD;  Location: [place] [facility];  Service: Cardiovascular;  Laterality: N/A;   THYROID LOBECTOMY Right    TOTAL HIP ARTHROPLASTY Right 03/01/2017   Procedure: RIGHT TOTAL HIP ARTHROPLASTY ANTERIOR APPROACH;  Surgeon: Paralee Cancel, MD;  Location: [facility];  Service: Orthopedics;  Laterality: Right;  70 mins   TOTAL HIP ARTHROPLASTY Left 04/29/2020   Procedure: TOTAL HIP ARTHROPLASTY ANTERIOR APPROACH;  Surgeon: Paralee Cancel, MD;  Location: [facility];  Service: Orthopedics;  Laterality: Left;  70 mins   TOTAL KNEE ARTHROPLASTY Right 2006   TOTAL KNEE ARTHROPLASTY Left 05/20/2014   DR Noemi Chapel   TOTAL KNEE ARTHROPLASTY Left 05/20/2014   Procedure: LEFT TOTAL KNEE ARTHROPLASTY;  Surgeon: Lorn Junes, MD;  Location: [facility];  Service: Orthopedics;  Laterality: Left;    Allergies:  Allergies  Allergen Reactions   Lipitor [Atorvastatin]     MUSCLE ACHES/FATIGUE   Morphine Nausea And Vomiting   Nitroglycerin Other (See Comments)    Very sensitive, drops BP significantly per pt   Penicillins Swelling and Other (See Comments)    Tolerated Cephalosporin Date: 04/29/20.  Severe arm swelling and redness Did it involve swelling of the face/tongue/throat, SOB, or low BP?  No Did it involve sudden or severe rash/hives, skin peeling, or any reaction on the inside of your mouth or nose? No Did you need to seek medical attention at a hospital or doctor's office? No When did it last happen?      30 years If all above answers are "NO", may proceed with cephalosporin use.   Reclast [Zoledronic Acid]     "nausea and vomiting. Could not get out of bed.    Medications:   Prior to Admission medications   Medication Sig Start Date End Date Taking? Authorizing Provider  aspirin EC 81 MG tablet Take 1 tablet (81 mg total) by mouth daily. Swallow whole. 06/26/20  Yes Satira Sark, MD  Cholecalciferol (VITAMIN D3) 125 MCG (5000 UT) CAPS Take 5,000 Units by mouth daily.   Yes [provider]  CINNAMON PO Take 1 tablet by mouth 2 (two) times daily.   Yes [provider]  ciprofloxacin (CIPRO) 500 MG tablet Take 500 mg by mouth 2 (two) times daily. 03/08/22  Yes [provider]  Coenzyme Q10 (COQ10 PO) Take 1 tablet by mouth daily.   Yes [provider]  famotidine (PEPCID) 20 MG tablet Take 10 mg by mouth 2 (two) times daily.   Yes [provider]  furosemide (LASIX) 40 MG tablet Take 40 mg by mouth 2 (two) times daily. 08/26/21  Yes [provider]  isosorbide dinitrate (ISORDIL) 20 MG tablet Take 1 tablet (20 mg total) by mouth 2 (two) times daily. 12/22/21  Yes Satira Sark, MD  levothyroxine (  SYNTHROID) 88 MCG tablet Take 88 mcg by mouth daily before breakfast.   Yes [provider]  metFORMIN (GLUCOPHAGE) 500 MG tablet Take 1 tablet (500 mg total) by mouth 2 (two) times daily with a meal. 11/10/18  Yes Mikhail, [place], DO  metoprolol succinate (TOPROL XL) 25 MG 24 hr tablet Take 1 tablet (25 mg total) by mouth daily. 01/14/22  Yes Satira Sark, MD  Polyethyl Glycol-Propyl Glycol (SYSTANE OP) Place 1 drop into both eyes as needed (dry eye).   Yes [provider]  potassium chloride SA  (K-DUR,KLOR-CON) 20 MEQ tablet Take 1 tablet (20 mEq total) by mouth daily after supper. 01/26/17  Yes Satira Sark, MD  psyllium (METAMUCIL) 58.6 % packet Take 1 packet by mouth daily.   Yes [provider]  rosuvastatin (CRESTOR) 5 MG tablet Take 1 tablet (5 mg total) by mouth daily. 12/01/21  Yes Satira Sark, MD  sertraline (ZOLOFT) 50 MG tablet Take 50 mg by mouth daily. 09/13/21  Yes [provider]  spironolactone (ALDACTONE) 25 MG tablet Take 0.5 tablets (12.5 mg total) by mouth daily. 07/06/16  Yes Herminio Commons, MD  vitamin C (ASCORBIC ACID) 500 MG tablet Take 500 mg by mouth daily.   Yes [provider]  Vitamin E 180 MG (400 UNIT) CAPS Take 400 Units by mouth daily.   Yes [provider]  acetaminophen (TYLENOL) 325 MG tablet Take 2 tablets (650 mg total) by mouth every 6 (six) hours as needed. Patient taking differently: Take 650 mg by mouth every 6 (six) hours as needed for mild pain. 06/18/21   Jaynee Eagles, PA-C  ONETOUCH ULTRA test strip USE 1 STRIP TO [place] DAILY 07/29/21   [provider]    Discontinued Meds:   Medications Discontinued During This Encounter  Medication Reason   APPLE CIDER VINEGAR PO Patient Preference   CRANBERRY PO Patient Preference   Cyanocobalamin (B-12) 2500 MCG TABS Patient Preference   Multiple Vitamins-Minerals (HAIR/SKIN/NAILS/BIOTIN) TABS Patient Preference   isosorbide dinitrate (ISORDIL) tablet 20 mg    tamsulosin (FLOMAX) capsule 0.4 mg    pantoprazole (PROTONIX) EC tablet 40 mg    0.9 % NaCl with KCl 40 mEq / L  infusion    enoxaparin (LOVENOX) injection 30 mg    0.9 %  sodium chloride infusion    0.9 %  sodium chloride infusion    furosemide (LASIX) injection 20 mg     Social History:  reports that she has never smoked. She has never used smokeless tobacco. She reports that she does not drink alcohol and does not use drugs.  Family History:   Family History   Problem Relation Age of Onset   Heart Problems Mother    Diabetes Father    Cirrhosis Father    Diabetes Other     Review of systems not obtained due to patient factors.  Blood pressure 110/67, pulse 75, temperature 97.6 F (36.4 C), temperature source Oral, resp. rate 14, height 5\' 4"  (1.626 m), weight (S) 67 kg, SpO2 98 %. General appearance: appears stated age, fatigued, no distress, and resting Resp: diminished breath sounds bibasilar Cardio: regular rate and rhythm, S1, S2 normal, no murmur, click, rub or gallop GI: soft, non-tender; bowel sounds normal; no masses,  no organomegaly Extremities: edema pitting edema  1-2+ Neurologic: Mental status: Alert, oriented, thought content appropriate, alertness: lethargic  Labs: Basic Metabolic Panel: Recent Labs  Lab 03/10/22 1430 03/11/22 0441 03/12/22 0225  03/12/22 1903 03/13/22 0355 03/13/22 1548 03/14/22 0430 03/14/22 1839 03/15/22 0155 03/15/22 0512  NA 119* 128* 124* 122* 122* 120* 123* 121* 121* 122*  K 2.2* 3.6 4.4 5.3* 5.8* 4.9 4.6  --   --  3.9  CL 69* 84* 86* 87* 87* 85* 88*  --   --  86*  CO2 37* 36* 29 27 25 25 26   --   --  26  GLUCOSE 110* 89 145* 179* 159* 173* 135*  --   --  120*  BUN 19 15 17 17 19 20 23   --   --  31*  CREATININE 1.17* 0.99 0.95 0.98 0.87 0.88 0.84  --   --  0.98  ALBUMIN 4.1  --   --   --  3.7 3.7 3.4* 3.6  --  3.3*  CALCIUM 8.6* 8.4* 8.6* 9.0 8.9 9.0 8.6*  --   --  8.4*  PHOS  --   --   --   --  3.3 4.0 4.4  --   --   --    Liver Function Tests: Recent Labs  Lab 03/10/22 1430 03/13/22 0355 03/14/22 0430 03/14/22 1839 03/15/22 0512  AST 34  --   --  101* 90*  ALT 23  --   --  103* 101*  ALKPHOS 74  --   --  145* 129*  BILITOT 0.8  --   --  0.7 0.9  PROT 7.0  --   --  6.3* 5.9*  ALBUMIN 4.1   < > 3.4* 3.6 3.3*   < > = values in this interval not displayed.   Recent Labs  Lab 03/10/22 1430  LIPASE 40   No results for input(s): "AMMONIA" in the last 168 hours. CBC: Recent  Labs  Lab 03/10/22 1430 03/11/22 0441 03/12/22 0225 03/13/22 0355 03/14/22 0430  WBC 7.0 7.4 7.1 8.0 7.9  NEUTROABS 5.2  --   --   --   --   HGB 12.7 11.2* 11.4* 12.4 11.9*  HCT 36.0 32.5* 33.4* 36.4 34.3*  MCV 91.6 93.1 94.6 94.3 93.7  PLT 263 258 244 253 242   PT/INR: @labrcntip (inr:5) Cardiac Enzymes: No results for input(s): "CKTOTAL", "CKMB", "CKMBINDEX", "TROPONINI" in the last 168 hours. CBG: Recent Labs  Lab 03/14/22 0752 03/14/22 1139 03/14/22 1634 03/14/22 2105 03/15/22 0744  GLUCAP 144* 166* 134* 141* 113*    Iron Studies: No results for input(s): "IRON", "TIBC", "TRANSFERRIN", "FERRITIN" in the last 168 hours.  Xrays/Other Studies: DG CHEST PORT 1 VIEW  Result Date: 03/13/2022 CLINICAL DATA:  Dyspnea. EXAM: PORTABLE CHEST 1 VIEW COMPARISON:  Chest x-ray 11/22/2021 FINDINGS: Heart is enlarged. Patient is status post cardiac surgery. There are new small bilateral pleural effusions. There central pulmonary vascular congestion. Patchy bibasilar opacities are present. There is no pneumothorax or acute fracture. Surgical clips overlie the left neck, unchanged. IMPRESSION: 1. Cardiomegaly with central pulmonary vascular congestion and new small bilateral pleural effusions. 2. Patchy bibasilar opacities may reflect atelectasis or infection. Electronically Signed   By: Ronney Asters M.D.   On: 03/13/2022 23:19     Assessment/Plan: 88 year old WF with history of biventricular CHF presented with hyponatremia felt to be dry-  initially given IVF, sodium up then down-  urine osm c/w SIADH. Given samsca but without major change in sodium -  since given IVF and not fluid restricted since admit is now volume overloaded   1.  Hyponatremia-  complex case.  Initially she may  have been dry on home lasix and that was hypovolemic hyponatremia.  However, since then has been much positive in her I's and O's and now I think the major mechanism is hypervolemic hyponatremia-  so will treat  with fluid restriction and increase dose of IV lasix and also add back aldactone.  Also of note her TSH is a little on the high side so I have increased her synthroid from 88 to 100 mcg daily.  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

## 2022-03-15 NOTE — Progress Notes (Signed)
PT Cancellation Note  Patient Details Name: Erin Herman MRN: [id number] DOB: 1927-08-26   Cancelled Treatment:    Reason Eval/Treat Not Completed: Other (comment).  Therapy held per MD request.     3:41 PM, 03/15/22 Ocie Bob, MPT Physical Therapist with [facility] 336 [phone] office [phone] mobile phone

## 2022-03-15 NOTE — Progress Notes (Addendum)
PROGRESS NOTE     Erin Herman, is a 88 y.o. female, DOB - 1928-02-22, HRC:[id number]  Admit date - 03/10/2022   Admitting Physician Erin Arlyce Dice, MD  Outpatient Primary MD for the patient is Erin Blitz, MD  LOS - 5  Chief Complaint  Patient presents with   Nausea      Brief Narrative:   88 y.o. female with medical history significant for systolic and diastolic CHF, CABG, left bundle branch block, diabetes mellitus, hypertension admitted with symptomatic hyponatremia    -Assessment and Plan: 1) acute symptomatic hyponatremia Sodium 119 on admission.   3 months ago sodium was 139.  Baseline ~mid 130s.  -Initially appeared dehydrated and volume depleted on admission due to poor oral intake and spironolactone and Lasix use -Prior to admission patient had occasional episodes of nausea vomiting and diarrhea but not enough to explain degree of electrolyte derangement by just GI losses -Mentation already improved back to baseline as per patient's daughter Erin Herman who is at bedside 03/15/22 -Repeat serum osm (was 257 on 03/10/2022, 265 on 03/14/22) -Repeat urine sodium osmolarity (was 144 on 03/10/2022, 426 on 03/13/22 ) TSH was 4.5 Uric Acid random and urine is 25 -Random sodium urine < 10 (it was 11 on 03/10/2022) -a.m. cortisol 14.6 -After IV fluids patient is no longer dehydrated, actually concerns about possible volume overload at this time -I requested official nephrology consultation from Erin Herman--- please see full consult note dated 03/15/2022 Received Tolvaptan 15 mg x 1 on 03/14/22 -IV Lasix and p.o. Aldactone as ordered -Continue serial sodium level checks----sodium level is currently around 122  2) acute on chronic combined systolic and diastolic congestive heart failure (HCC) - Last echo 10/2021, EF of 35%, with grade 3 DD- restrictive. -03/15/22 -Wt gain and Volume overload concerns   -IV Lasix and Aldactone as above #1 -Daily weights and  fluid input and output monitoring  3) chronically prolonged QT interval Prolonged at 531, chronic.  -Replaced electrolytes  4)Hyperkalemia/hypomagnesemia -Normalized with Rx -Lasix and Aldactone as above #1  5)DM2 -A1c 6.6 reflecting good diabetic control PTA -Use Novolog/Humalog Sliding scale insulin with Accu-Cheks/Fingersticks as ordered  -Continue to hold metformin  6)CAD, NATIVE VESSEL History of CABG 2006 and subsequent stent placement 2020.   -Chest pain-free, continue aspirin and metoprolol -Hold isosorbide due to soft BP  7)Essential hypertension, benign Stable. -Stopped Imdur due to soft BP,  -Continue metoprolol due to tachycardia  8) acute on chronic urinary retention--patient has chronic urinary retention, usually sees urogynecologist at [facility] -Voiding difficulties improving with Flomax -in and out catheterization as needed  9)Social/Ethics--- plan of care and directives discussed with Erin Herman (daughter Ms Erin Herman) and granddaughter Erin Herman- -family will like to have further conversations amongst themselves prior to making a final decision -Full code for now -Overall prognosis is not great given advanced age, low EF and other comorbid conditions  10) anxiety disorder --- Zoloft on hold given hyponatremia  -May use Xanax as needed   11)Transaminitis--suspect this may be related to hepatic congestion in the setting of acute on chronic combined CHF exacerbation -    Latest Ref Rng & Units 03/15/2022    5:12 AM 03/14/2022    6:39 PM 03/14/2022    4:30 AM  Hepatic Function  Total Protein 6.5 - 8.1 g/dL 5.9  6.3    Albumin 3.5 - 5.0 g/dL 3.3  3.6  3.4   AST 15 - 41 U/L 90  101    ALT  0 - 44 U/L 101  103    Alk Phosphatase 38 - 126 U/L 129  145    Total Bilirubin 0.3 - 1.2 mg/dL 0.9  0.7    Bilirubin, Direct 0.0 - 0.2 mg/dL  0.2       Disposition/Need for in-Hospital Stay- patient unable to be discharged at this time due to -acute  symptomatic hyponatremia requiring serial BMP/sodium checks and acute on chronic CHF exacerbation requiring IV Lasix  Status is: Inpatient   Disposition: The patient is from: Home              Anticipated d/c is to: Home              Anticipated d/c date is: 2 days              Patient currently is not medically stable to d/c. Barriers: Not Clinically Stable-   Code Status :  -  Code Status: Full Code   Family Communication:    Discussed with patient's daughter Erin Herman and Erin Herman at bedside  DVT Prophylaxis  :   - SCDs   enoxaparin (LOVENOX) injection 30 mg Start: 03/15/22 2000   Lab Results  Component Value Date   PLT 242 03/14/2022   Inpatient Medications  Scheduled Meds:  aspirin EC  81 mg Oral Daily   Chlorhexidine Gluconate Cloth  6 each Topical Daily   enoxaparin (LOVENOX) injection  30 mg Subcutaneous Q24H   famotidine  20 mg Oral Daily   feeding supplement (GLUCERNA SHAKE)  237 mL Oral TID BM   furosemide  40 mg Intravenous Q6H   insulin aspart  0-5 Units Subcutaneous QHS   insulin aspart  0-9 Units Subcutaneous TID WC   [START ON 03/16/2022] levothyroxine  100 mcg Oral Q0600   metoprolol succinate  12.5 mg Oral Daily   spironolactone  12.5 mg Oral Daily   Continuous Infusions:  PRN Meds:.acetaminophen **OR** acetaminophen, ALPRAZolam, levalbuterol, polyethylene glycol, promethazine   Anti-infectives (From admission, onward)    None       Subjective: Erin Herman today has no fevers, no emesis,  No chest pain,   - 03/15/22 -Daughter Erin Herman at bedside---questions answered -Patient states that dyspnea is improving -Voiding better - Objective: Vitals:   03/15/22 0900 03/15/22 1000 03/15/22 1100 03/15/22 1200  BP: (!) 90/42   110/60  Pulse: 73 80    Resp: 17 (!) 30 (!) 22 20  Temp:      TempSrc:      SpO2: 95% 97%    Weight:      Height:        Intake/Output Summary (Last 24 hours) at 03/15/2022 1555 Last data filed at 03/15/2022 1350 Gross  per 24 hour  Intake 1210 ml  Output 2375 ml  Net -1165 ml   Filed Weights   03/14/22 0652 03/14/22 1824 03/15/22 0544  Weight: 65 kg (S) 65.8 kg (S) 67 kg   Physical Exam  Gen:- Awake Alert,  in no apparent distress  HEENT:- [place].AT, No sclera icterus Neck-Supple Neck,No JVD,.  Lungs-  CTAB , fair symmetrical air movement CV- S1, S2 normal, irregular , tachycardic, CABG scar Abd-  +ve B.Sounds, Abd Soft, No tenderness,    Extremity/Skin:- +ve edema, pedal pulses present  Psych-affect is appropriate, oriented x3 Neuro-generalized weakness, no new focal deficits, no tremors  Data Reviewed: I have personally reviewed following labs and imaging studies  CBC: Recent Labs  Lab 03/10/22 1430 03/11/22 0441 03/12/22 0225 03/13/22 0355  03/14/22 0430  WBC 7.0 7.4 7.1 8.0 7.9  NEUTROABS 5.2  --   --   --   --   HGB 12.7 11.2* 11.4* 12.4 11.9*  HCT 36.0 32.5* 33.4* 36.4 34.3*  MCV 91.6 93.1 94.6 94.3 93.7  PLT 263 258 244 253 415   Basic Metabolic Panel: Recent Labs  Lab 03/10/22 1430 03/11/22 0441 03/12/22 0225 03/13/22 0355 03/13/22 1548 03/14/22 0430 03/14/22 1839 03/15/22 0155 03/15/22 0512 03/15/22 1004  NA 119* 128*   < > 122* 120* 123* 121* 121* 122* 122*  K 2.2* 3.6   < > 5.8* 4.9 4.6  --   --  3.9 4.6  CL 69* 84*   < > 87* 85* 88*  --   --  86* 84*  CO2 37* 36*   < > _0 --   --  26 29  GLUCOSE 110* 89   < > 159* 173* 135*  --   --  120* 163*  BUN 19 15   < > _1 --   --  31* 32*  CREATININE 1.17* 0.99   < > 0.87 0.88 0.84  --   --  0.98 1.17*  CALCIUM 8.6* 8.4*   < > 8.9 9.0 8.6*  --   --  8.4* 8.5*  MG 1.6* 1.9  --   --   --   --   --   --   --   --   PHOS  --   --   --  3.3 4.0 4.4  --   --   --   --    < > = values in this interval not displayed.   GFR: Estimated Creatinine Clearance: 28.9 mL/min (A) (by C-G formula based on SCr of 1.17 mg/dL (H)). Liver Function Tests: Recent Labs  Lab 03/10/22 1430 03/13/22 0355 03/13/22 1548  03/14/22 0430 03/14/22 1839 03/15/22 0512  AST 34  --   --   --  101* 90*  ALT 23  --   --   --  103* 101*  ALKPHOS 74  --   --   --  145* 129*  BILITOT 0.8  --   --   --  0.7 0.9  PROT 7.0  --   --   --  6.3* 5.9*  ALBUMIN 4.1 3.7 3.7 3.4* 3.6 3.3*   Recent Results (from the past 240 hour(s))  MRSA Next Gen by PCR, Nasal     Status: None   Collection Time: 03/10/22  6:37 PM   Specimen: Nasal Mucosa; Nasal Swab  Result Value Ref Range Status   MRSA by PCR Next Gen NOT DETECTED NOT DETECTED Final    Comment: (NOTE) The GeneXpert MRSA Assay (FDA approved for NASAL specimens only), is one component of a comprehensive MRSA colonization surveillance program. It is not intended to diagnose MRSA infection nor to guide or monitor treatment for MRSA infections. Test performance is not FDA approved in patients less than 66 years old. Performed at [facility], [address], [place], [place] [address]     Radiology Studies: DG CHEST PORT 1 VIEW  Result Date: 03/13/2022 CLINICAL DATA:  Dyspnea. EXAM: PORTABLE CHEST 1 VIEW COMPARISON:  Chest x-ray 11/22/2021 FINDINGS: Heart is enlarged. Patient is status post cardiac surgery. There are new small bilateral pleural effusions. There central pulmonary vascular congestion. Patchy bibasilar opacities are present. There is no pneumothorax or acute fracture. Surgical clips overlie the left  neck, unchanged. IMPRESSION: 1. Cardiomegaly with central pulmonary vascular congestion and new small bilateral pleural effusions. 2. Patchy bibasilar opacities may reflect atelectasis or infection. Electronically Signed   By: Ronney Asters M.D.   On: 03/13/2022 23:19    Scheduled Meds:  aspirin EC  81 mg Oral Daily   Chlorhexidine Gluconate Cloth  6 each Topical Daily   enoxaparin (LOVENOX) injection  30 mg Subcutaneous Q24H   famotidine  20 mg Oral Daily   feeding supplement (GLUCERNA SHAKE)  237 mL Oral TID BM   furosemide  40 mg Intravenous Q6H   insulin  aspart  0-5 Units Subcutaneous QHS   insulin aspart  0-9 Units Subcutaneous TID WC   [START ON 03/16/2022] levothyroxine  100 mcg Oral Q0600   metoprolol succinate  12.5 mg Oral Daily   spironolactone  12.5 mg Oral Daily   Continuous Infusions:   LOS: 5 days   Roxan Hockey M.D on 03/15/2022 at 3:55 PM  Go to [email or web address] - for contact info  [facility] - Office  [phone]  If 7PM-7AM, please contact night-coverage [email or web address] 03/15/2022, 3:55 PM

## 2022-03-16 ENCOUNTER — Encounter (HOSPITAL_COMMUNITY): Payer: Self-pay | Admitting: Internal Medicine

## 2022-03-16 DIAGNOSIS — E871 Hypo-osmolality and hyponatremia: Secondary | ICD-10-CM | POA: Diagnosis not present

## 2022-03-16 LAB — BASIC METABOLIC PANEL
Anion gap: 10 (ref 5–15)
Anion gap: 10 (ref 5–15)
BUN: 32 mg/dL — ABNORMAL HIGH (ref 8–23)
BUN: 31 mg/dL — ABNORMAL HIGH (ref 8–23)
CO2: 30 mmol/L (ref 22–32)
CO2: 29 mmol/L (ref 22–32)
Calcium: 8.4 mg/dL — ABNORMAL LOW (ref 8.9–10.3)
Calcium: 8.4 mg/dL — ABNORMAL LOW (ref 8.9–10.3)
Chloride: 92 mmol/L — ABNORMAL LOW (ref 98–111)
Chloride: 92 mmol/L — ABNORMAL LOW (ref 98–111)
Creatinine, Ser: 1.02 mg/dL — ABNORMAL HIGH (ref 0.44–1.00)
Creatinine, Ser: 1.11 mg/dL — ABNORMAL HIGH (ref 0.44–1.00)
GFR, Estimated: 52 mL/min — ABNORMAL LOW (ref >60)
GFR, Estimated: 47 mL/min — ABNORMAL LOW (ref >60)
Glucose, Bld: 147 mg/dL — ABNORMAL HIGH (ref 70–99)
Glucose, Bld: 139 mg/dL — ABNORMAL HIGH (ref 70–99)
Potassium: 3.3 mmol/L — ABNORMAL LOW (ref 3.5–5.1)
Potassium: 4.2 mmol/L (ref 3.5–5.1)
Sodium: 132 mmol/L — ABNORMAL LOW (ref 135–145)
Sodium: 131 mmol/L — ABNORMAL LOW (ref 135–145)

## 2022-03-16 LAB — GLUCOSE, CAPILLARY
Glucose-Capillary: 172 mg/dL — ABNORMAL HIGH (ref 70–99)
Glucose-Capillary: 95 mg/dL (ref 70–99)
Glucose-Capillary: 129 mg/dL — ABNORMAL HIGH (ref 70–99)
Glucose-Capillary: 111 mg/dL — ABNORMAL HIGH (ref 70–99)

## 2022-03-16 LAB — COMPREHENSIVE METABOLIC PANEL
ALT: 83 U/L — ABNORMAL HIGH (ref 0–44)
AST: 62 U/L — ABNORMAL HIGH (ref 15–41)
Albumin: 3.3 g/dL — ABNORMAL LOW (ref 3.5–5.0)
Alkaline Phosphatase: 114 U/L (ref 38–126)
Anion gap: 10 (ref 5–15)
BUN: 31 mg/dL — ABNORMAL HIGH (ref 8–23)
CO2: 29 mmol/L (ref 22–32)
Calcium: 8.5 mg/dL — ABNORMAL LOW (ref 8.9–10.3)
Chloride: 93 mmol/L — ABNORMAL LOW (ref 98–111)
Creatinine, Ser: 1.01 mg/dL — ABNORMAL HIGH (ref 0.44–1.00)
GFR, Estimated: 52 mL/min — ABNORMAL LOW (ref >60)
Glucose, Bld: 103 mg/dL — ABNORMAL HIGH (ref 70–99)
Potassium: 3.9 mmol/L (ref 3.5–5.1)
Sodium: 132 mmol/L — ABNORMAL LOW (ref 135–145)
Total Bilirubin: 0.7 mg/dL (ref 0.3–1.2)
Total Protein: 5.8 g/dL — ABNORMAL LOW (ref 6.5–8.1)

## 2022-03-16 LAB — CBC
HCT: 34.9 % — ABNORMAL LOW (ref 36.0–46.0)
Hemoglobin: 12 g/dL (ref 12.0–15.0)
MCH: 32.4 pg (ref 26.0–34.0)
MCHC: 34.4 g/dL (ref 30.0–36.0)
MCV: 94.3 fL (ref 80.0–100.0)
Platelets: 237 10*3/uL (ref 150–400)
RBC: 3.7 MIL/uL — ABNORMAL LOW (ref 3.87–5.11)
RDW: 13.4 % (ref 11.5–15.5)
WBC: 7.6 10*3/uL (ref 4.0–10.5)
nRBC: 0 % (ref 0.0–0.2)

## 2022-03-16 MED ORDER — FUROSEMIDE 10 MG/ML IJ SOLN: 40.0000 mg | Freq: Every day | INTRAMUSCULAR | Status: DC

## 2022-03-16 MED ORDER — FUROSEMIDE 40 MG PO TABS
40.0000 mg | ORAL_TABLET | Freq: Two times a day (BID) | ORAL | Status: DC
  Administered 2022-03-17 – 2022-03-18 (×3): 40 mg via ORAL
  Filled 2022-03-16 (×3): qty 1

## 2022-03-16 MED ORDER — OXYCODONE HCL 5 MG PO TABS
5.0000 mg | ORAL_TABLET | Freq: Once | ORAL | Status: AC
  Administered 2022-03-16: 5 mg via ORAL
  Filled 2022-03-16: qty 1

## 2022-03-16 MED ORDER — POTASSIUM CHLORIDE CRYS ER 20 MEQ PO TBCR
40.0000 meq | EXTENDED_RELEASE_TABLET | Freq: Once | ORAL | Status: AC
  Administered 2022-03-16: 40 meq via ORAL
  Filled 2022-03-16: qty 2

## 2022-03-16 NOTE — Progress Notes (Signed)
Subjective:  ended up 4 liters negative with the lasix dosing ( in hindsight maybe too aggressive of dosing on my part )  however, pt clinically improved today, more alert-  already has been up in chair and eating breakfast-  sodium up to 132  Objective Vital signs in last 24 hours: Vitals:   03/16/22 0100 03/16/22 0200 03/16/22 0300 03/16/22 0422  BP: (!) 109/48 (!) 93/45 118/65   Pulse: 77  64   Resp: 18  20   Temp:    (!) 97.4 F (36.3 C)  TempSrc:    Oral  SpO2: 96%  94%   Weight:      Height:       Weight change:   Intake/Output Summary (Last 24 hours) at 03/16/2022 0750 Last data filed at 03/16/2022 0200 Gross per 24 hour  Intake 730 ml  Output 4975 ml  Net -4245 ml    Assessment/Plan: 88 year old WF with history of biventricular CHF presented with hyponatremia felt to be dry-  initially given IVF, sodium up then down-  urine osm c/w SIADH. Given samsca but without major change in sodium -   given IVF and not fluid restricted since admit is now volume overloaded   1.  Hyponatremia-  complex case.  Initially she may have been dry on home lasix and that was hypovolemic hyponatremia.  However, since then has been much positive in her I's and O's and then I think the major mechanism was hypervolemic hyponatremia-  treated with fluid restriction and IV lasix and also add back aldactone with good results, sodium up quite a bit.  Also of note her TSH is a little on the high side so I increased her synthroid from 88 to 100 mcg daily.  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 HTN/volume-  responded really well to IV lasix-  hold today then resume PO lasix at home dose 40 BID tomorrow     Louis Meckel    Labs: Basic Metabolic Panel: Recent Labs  Lab 03/13/22 0355 03/13/22 1548 03/14/22 0430 03/14/22 1839 03/15/22 1004 03/15/22 1657 03/16/22 0431  NA 122* 120* 123*   < > 122* 124* 132*   K 5.8* 4.9 4.6   < > 4.6 3.9 3.3*  CL 87* 85* 88*   < > 84* 86* 92*  CO2 25 25 26    < > 29 27 30   GLUCOSE 159* 173* 135*   < > 163* 134* 147*  BUN 19 20 23    < > 32* 32* 32*  CREATININE 0.87 0.88 0.84   < > 1.17* 1.07* 1.02*  CALCIUM 8.9 9.0 8.6*   < > 8.5* 8.4* 8.4*  PHOS 3.3 4.0 4.4  --   --   --   --    < > = values in this interval not displayed.   Liver Function Tests: Recent Labs  Lab 03/10/22 1430 03/13/22 0355 03/14/22 0430 03/14/22 1839 03/15/22 0512  AST 34  --   --  101* 90*  ALT 23  --   --  103* 101*  ALKPHOS 74  --   --  145* 129*  BILITOT 0.8  --   --  0.7 0.9  PROT 7.0  --   --  6.3* 5.9*  ALBUMIN 4.1   < > 3.4* 3.6 3.3*   < > = values in this interval not displayed.  Recent Labs  Lab 03/10/22 1430  LIPASE 40   No results for input(s): "AMMONIA" in the last 168 hours. CBC: Recent Labs  Lab 03/10/22 1430 03/11/22 0441 03/12/22 0225 03/13/22 0355 03/14/22 0430 03/16/22 0431  WBC 7.0 7.4 7.1 8.0 7.9 7.6  NEUTROABS 5.2  --   --   --   --   --   HGB 12.7 11.2* 11.4* 12.4 11.9* 12.0  HCT 36.0 32.5* 33.4* 36.4 34.3* 34.9*  MCV 91.6 93.1 94.6 94.3 93.7 94.3  PLT 263 258 244 253 242 237   Cardiac Enzymes: No results for input(s): "CKTOTAL", "CKMB", "CKMBINDEX", "TROPONINI" in the last 168 hours. CBG: Recent Labs  Lab 03/14/22 2105 03/15/22 0744 03/15/22 1232 03/15/22 1607 03/15/22 2119  GLUCAP 141* 113* 162* 130* 147*    Iron Studies: No results for input(s): "IRON", "TIBC", "TRANSFERRIN", "FERRITIN" in the last 72 hours. Studies/Results: No results found. Medications: Infusions:   Scheduled Medications:  aspirin EC  81 mg Oral Daily   Chlorhexidine Gluconate Cloth  6 each Topical Daily   enoxaparin (LOVENOX) injection  30 mg Subcutaneous Q24H   famotidine  20 mg Oral Daily   feeding supplement (GLUCERNA SHAKE)  237 mL Oral TID BM   furosemide  40 mg Intravenous Q6H   insulin aspart  0-5 Units Subcutaneous QHS   insulin aspart  0-9  Units Subcutaneous TID WC   levothyroxine  100 mcg Oral Q0600   metoprolol succinate  12.5 mg Oral Daily   spironolactone  12.5 mg Oral Daily    have reviewed scheduled and prn medications.  Physical Exam: General: more bright and interactive  Heart: RRR Lungs: mostly clear Abdomen: soft, non tender Extremities: min edema now    03/16/2022,7:50 AM  LOS: 6 days

## 2022-03-16 NOTE — Progress Notes (Signed)
PROGRESS NOTE     Erin Herman, is a 88 y.o. female, DOB - 05-25-1927, NIO:[id number]  Admit date - 03/10/2022   Admitting Physician Bethena Roys, MD  Outpatient Primary MD for the patient is Monico Blitz, MD  LOS - 6  Chief Complaint  Patient presents with   Nausea      Brief Narrative:   88 y.o. female with medical history significant for systolic and diastolic CHF, CABG, left bundle branch block, diabetes mellitus, hypertension admitted on 03/10/22 with symptomatic hyponatremia    -Assessment and Plan: 1) acute symptomatic hyponatremia Sodium 119 on admission.   3 months ago sodium was 139.  Baseline ~mid 130s.  -Initially appeared dehydrated and volume depleted on admission due to poor oral intake and spironolactone and Lasix use -Prior to admission patient had occasional episodes of nausea vomiting and diarrhea but not enough to explain degree of electrolyte derangement by just GI losses -Mentation already improved back to baseline as per patient's daughter Erin Herman who is at bedside 03/16/22 -Repeat serum osm (was 257 on 03/10/2022, 265 on 03/14/22) -Repeat urine sodium osmolarity (was 144 on 03/10/2022, 426 on 03/13/22 ) TSH was 4.5 Uric Acid random and urine is 25 -Random sodium urine < 10 (it was 11 on 03/10/2022) -a.m. cortisol 14.6 -After IV fluids patient is no longer dehydrated, actually concerns about possible volume overload at this time -I requested official nephrology consultation from Dr. Moshe Cipro--- please see full consult note dated 03/15/2022 Received Tolvaptan 15 mg x 1 on 03/14/22 -Received IV Lasix and Aldactone on 03/15/2022--sodium levels improved significantly but developed hypotension required IV fluid bolus for persistent hypotension -Per nephrologist no further Lasix on 03/16/2022 -May restart oral Lasix on 03/17/2022 -Continue serial sodium level checks----  2) acute on chronic combined systolic and diastolic congestive heart  failure ([place]) - Last echo 10/2021, EF of 35%, with grade 3 DD- restrictive. -03/16/22 -Wt gain and Volume overload concerns   -IV Lasix and Aldactone as above #1 -Daily weights and fluid input and output monitoring  3) chronically prolonged QT interval Prolonged at 531, chronic.  -Replaced electrolytes  4)Hyperkalemia/hypomagnesemia -Normalized with Rx -Lasix and Aldactone as above #1  5)DM2 -A1c 6.6 reflecting good diabetic control PTA -Use Novolog/Humalog Sliding scale insulin with Accu-Cheks/Fingersticks as ordered  -Continue to hold metformin  6)CAD, NATIVE VESSEL History of CABG 2006 and subsequent stent placement 2020.   -Chest pain-free, continue aspirin and metoprolol -Hold isosorbide due to soft BP  7)Essential hypertension, benign Stable. -Stopped Imdur due to soft BP,  -Continue metoprolol due to tachycardia  8) acute on chronic urinary retention--patient has chronic urinary retention, usually sees [facility]rogy[facility] at [facility] -Voiding difficulties improving with Flomax -in and out catheterization as needed  9)Social/Ethics--- plan of care and directives discussed with Erin Herman (daughter Ms Metro Kung) and granddaughter Erin Herman- -family will like to have further conversations amongst themselves prior to making a final decision -Full code for now -Overall prognosis is not great given advanced age, low EF and other comorbid conditions  10) anxiety disorder --- Zoloft on hold given hyponatremia  -May use Xanax as needed   11)Transaminitis--suspect this may be related to hepatic congestion in the setting of acute on chronic combined CHF exacerbation -    Latest Ref Rng & Units 03/16/2022   12:24 PM 03/15/2022    5:12 AM 03/14/2022    6:39 PM  Hepatic Function  Total Protein 6.5 - 8.1 g/dL 5.8  5.9  6.3   Albumin  3.5 - 5.0 g/dL 3.3  3.3  3.6   AST 15 - 41 U/L 62  90  101   ALT 0 - 44 U/L 83  101  103   Alk Phosphatase 38 - 126 U/L 114  129   145   Total Bilirubin 0.3 - 1.2 mg/dL 0.7  0.9  0.7   Bilirubin, Direct 0.0 - 0.2 mg/dL   0.2   - LFTs trending down with improvement in hepatic congestion/CHF  12) generalized weakness/deconditioning--- get PT eval  Disposition/Need for in-Hospital Stay- patient unable to be discharged at this time due to -acute symptomatic hyponatremia requiring serial BMP/sodium checks and acute on chronic CHF exacerbation requiring IV Lasix  Status is: Inpatient   Disposition: The patient is from: Home              Anticipated d/c is to: Home              Anticipated d/c date is: 2 days              Patient currently is not medically stable to d/c. Barriers: Not Clinically Stable-   Code Status :  -  Code Status: Full Code   Family Communication:    Discussed with patient's daughter Erin Herman and [name] at bedside  DVT Prophylaxis  :   - SCDs   enoxaparin (LOVENOX) injection 30 mg Start: 03/15/22 2000   Lab Results  Component Value Date   PLT 237 03/16/2022   Inpatient Medications  Scheduled Meds:  aspirin EC  81 mg Oral Daily   Chlorhexidine Gluconate Cloth  6 each Topical Daily   enoxaparin (LOVENOX) injection  30 mg Subcutaneous Q24H   famotidine  20 mg Oral Daily   feeding supplement (GLUCERNA SHAKE)  237 mL Oral TID BM   [START ON 03/17/2022] furosemide  40 mg Oral BID   insulin aspart  0-5 Units Subcutaneous QHS   insulin aspart  0-9 Units Subcutaneous TID WC   levothyroxine  100 mcg Oral Q0600   metoprolol succinate  12.5 mg Oral Daily   spironolactone  12.5 mg Oral Daily   Continuous Infusions:  PRN Meds:.acetaminophen **OR** acetaminophen, ALPRAZolam, levalbuterol, polyethylene glycol, promethazine   Anti-infectives (From admission, onward)    None       Subjective: [name] today has no fevers, no emesis,  No chest pain,   - 03/16/22 -Daughter Erin Herman at bedside---questions answered ---Received IV Lasix and Aldactone on 03/15/2022--sodium levels improved  significantly but developed hypotension required IV fluid bolus for persistent hypotension -Blood pressure much improved at this time - Objective: Vitals:   03/16/22 0422 03/16/22 1154 03/16/22 1424 03/16/22 1621  BP:   (!) 140/54   Pulse:   88   Resp:   16   Temp: (!) 97.4 F (36.3 C) 97.7 F (36.5 C)  98.1 F (36.7 C)  TempSrc: Oral Oral  Oral  SpO2:   95%   Weight:      Height:        Intake/Output Summary (Last 24 hours) at 03/16/2022 1751 Last data filed at 03/16/2022 1200 Gross per 24 hour  Intake 660 ml  Output 3350 ml  Net -2690 ml   Filed Weights   03/14/22 0652 03/14/22 1824 03/15/22 0544  Weight: 65 kg (S) 65.8 kg (S) 67 kg   Physical Exam  Gen:- Awake Alert,  in no apparent distress  HEENT:- [place].AT, No sclera icterus Neck-Supple Neck,No JVD,.  Lungs-improving air movement, no wheezing or  rales at this time  CV- S1, S2 normal, irregular , tachycardic, CABG scar Abd-  +ve B.Sounds, Abd Soft, No tenderness,    Extremity/Skin:-Improving edema, pedal pulses present  Psych-affect is anxious at times, oriented x3 Neuro-generalized weakness, no new focal deficits, no tremors  Data Reviewed: I have personally reviewed following labs and imaging studies  CBC: Recent Labs  Lab 03/10/22 1430 03/11/22 0441 03/12/22 0225 03/13/22 0355 03/14/22 0430 03/16/22 0431  WBC 7.0 7.4 7.1 8.0 7.9 7.6  NEUTROABS 5.2  --   --   --   --   --   HGB 12.7 11.2* 11.4* 12.4 11.9* 12.0  HCT 36.0 32.5* 33.4* 36.4 34.3* 34.9*  MCV 91.6 93.1 94.6 94.3 93.7 94.3  PLT 263 258 244 253 242 765   Basic Metabolic Panel: Recent Labs  Lab 03/10/22 1430 03/11/22 0441 03/12/22 0225 03/13/22 0355 03/13/22 1548 03/14/22 0430 03/14/22 1839 03/15/22 1004 03/15/22 1657 03/16/22 0431 03/16/22 1224 03/16/22 1601  NA 119* 128*   < > 122* 120* 123*   < > 122* 124* 132* 132* 131*  K 2.2* 3.6   < > 5.8* 4.9 4.6   < > 4.6 3.9 3.3* 3.9 4.2  CL 69* 84*   < > 87* 85* 88*   < > 84* 86* 92*  93* 92*  CO2 37* 36*   < > _0 < > _1 GLUCOSE 110* 89   < > 159* 173* 135*   < > 163* 134* 147* 103* 139*  BUN 19 15   < > _2 < > 32* 32* 32* 31* 31*  CREATININE 1.17* 0.99   < > 0.87 0.88 0.84   < > 1.17* 1.07* 1.02* 1.01* 1.11*  CALCIUM 8.6* 8.4*   < > 8.9 9.0 8.6*   < > 8.5* 8.4* 8.4* 8.5* 8.4*  MG 1.6* 1.9  --   --   --   --   --   --   --   --   --   --   PHOS  --   --   --  3.3 4.0 4.4  --   --   --   --   --   --    < > = values in this interval not displayed.   GFR: Estimated Creatinine Clearance: 30.4 mL/min (A) (by C-G formula based on SCr of 1.11 mg/dL (H)). Liver Function Tests: Recent Labs  Lab 03/10/22 1430 03/13/22 0355 03/13/22 1548 03/14/22 0430 03/14/22 1839 03/15/22 0512 03/16/22 1224  AST 34  --   --   --  101* 90* 62*  ALT 23  --   --   --  103* 101* 83*  ALKPHOS 74  --   --   --  145* 129* 114  BILITOT 0.8  --   --   --  0.7 0.9 0.7  PROT 7.0  --   --   --  6.3* 5.9* 5.8*  ALBUMIN 4.1   < > 3.7 3.4* 3.6 3.3* 3.3*   < > = values in this interval not displayed.   Recent Results (from the past 240 hour(s))  MRSA Next Gen by PCR, Nasal     Status: None   Collection Time: 03/10/22  6:37 PM   Specimen: Nasal Mucosa; Nasal Swab  Result Value Ref Range Status   MRSA by PCR Next Gen NOT DETECTED NOT DETECTED Final  Comment: (NOTE) The GeneXpert MRSA Assay (FDA approved for NASAL specimens only), is one component of a comprehensive MRSA colonization surveillance program. It is not intended to diagnose MRSA infection nor to guide or monitor treatment for MRSA infections. Test performance is not FDA approved in patients less than 56 years old. Performed at [facility], [address] [place], [place] [address]     Radiology Studies: No results found.  Scheduled Meds:  aspirin EC  81 mg Oral Daily   Chlorhexidine Gluconate Cloth  6 each Topical Daily   enoxaparin (LOVENOX) injection  30 mg Subcutaneous Q24H   famotidine  20  mg Oral Daily   feeding supplement (GLUCERNA SHAKE)  237 mL Oral TID BM   [START ON 03/17/2022] furosemide  40 mg Oral BID   insulin aspart  0-5 Units Subcutaneous QHS   insulin aspart  0-9 Units Subcutaneous TID WC   levothyroxine  100 mcg Oral Q0600   metoprolol succinate  12.5 mg Oral Daily   spironolactone  12.5 mg Oral Daily   Continuous Infusions:   LOS: 6 days   Roxan Hockey M.D on 03/16/2022 at 5:51 PM  Go to [email or web address] - for contact info  [facility] - Office  [phone]  If 7PM-7AM, please contact night-coverage [email or web address] 03/16/2022, 5:51 PM

## 2022-03-16 NOTE — Progress Notes (Signed)
2030pm. Report given to 300 RN, Vernona Rieger. Pt transferred via wheelchair. Pt in room 331 with daughter at bedside.

## 2022-03-17 DIAGNOSIS — R9431 Abnormal electrocardiogram [ECG] [EKG]: Secondary | ICD-10-CM | POA: Diagnosis not present

## 2022-03-17 DIAGNOSIS — E871 Hypo-osmolality and hyponatremia: Secondary | ICD-10-CM | POA: Diagnosis not present

## 2022-03-17 DIAGNOSIS — E876 Hypokalemia: Secondary | ICD-10-CM | POA: Diagnosis not present

## 2022-03-17 LAB — RENAL FUNCTION PANEL
Albumin: 3.5 g/dL (ref 3.5–5.0)
Anion gap: 9 (ref 5–15)
BUN: 27 mg/dL — ABNORMAL HIGH (ref 8–23)
CO2: 27 mmol/L (ref 22–32)
Calcium: 8.6 mg/dL — ABNORMAL LOW (ref 8.9–10.3)
Chloride: 93 mmol/L — ABNORMAL LOW (ref 98–111)
Creatinine, Ser: 1.05 mg/dL — ABNORMAL HIGH (ref 0.44–1.00)
GFR, Estimated: 50 mL/min — ABNORMAL LOW (ref >60)
Glucose, Bld: 140 mg/dL — ABNORMAL HIGH (ref 70–99)
Phosphorus: 3.9 mg/dL (ref 2.5–4.6)
Potassium: 4.1 mmol/L (ref 3.5–5.1)
Sodium: 129 mmol/L — ABNORMAL LOW (ref 135–145)

## 2022-03-17 LAB — GLUCOSE, CAPILLARY
Glucose-Capillary: 134 mg/dL — ABNORMAL HIGH (ref 70–99)
Glucose-Capillary: 149 mg/dL — ABNORMAL HIGH (ref 70–99)
Glucose-Capillary: 160 mg/dL — ABNORMAL HIGH (ref 70–99)
Glucose-Capillary: 137 mg/dL — ABNORMAL HIGH (ref 70–99)

## 2022-03-17 MED ORDER — BUSPIRONE HCL 5 MG PO TABS
7.5000 mg | ORAL_TABLET | Freq: Two times a day (BID) | ORAL | Status: DC
  Administered 2022-03-17 – 2022-03-18 (×4): 7.5 mg via ORAL
  Filled 2022-03-17 (×5): qty 2

## 2022-03-17 NOTE — Progress Notes (Signed)
Subjective:  moved out of ICU-  still with reasonable UOP-  negative by I's and O's-  sodium trended down a little after big jump up to 132-  was 129 this AM-  she can't get comfortable   Objective Vital signs in last 24 hours: Vitals:   03/16/22 1800 03/16/22 1900 03/16/22 2029 03/17/22 0417  BP: 138/84 123/73 134/81 126/79  Pulse:  80 81 82  Resp: 19 17 18 16   Temp:   97.8 F (36.6 C) 97.7 F (36.5 C)  TempSrc:   Oral Oral  SpO2:  99% 93% 99%  Weight:   63.8 kg   Height:       Weight change:   Intake/Output Summary (Last 24 hours) at 03/17/2022 0756 Last data filed at 03/17/2022 0500 Gross per 24 hour  Intake 660 ml  Output 950 ml  Net -290 ml    Assessment/Plan: 88 year old WF with history of biventricular CHF presented with hyponatremia felt to be dry-  initially given IVF, sodium up then down-  urine osm c/w SIADH. Given samsca but without major change in sodium -   given IVF and not fluid restricted since admit so then was volume overloaded   1.  Hyponatremia-  complex case.  Initially she may have been dry on home lasix and that was hypovolemic hyponatremia- also some SIADH.  However, since then has been much positive in her I's and O's and then I think the major mechanism was hypervolemic hyponatremia-  treated with fluid restriction and IV lasix and also add back aldactone with good results, sodium up quite a bit.    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 Had a huge improvement with big diuresis from Monday to Tuesday( also might have been delayed effect from samsca)  a little rebound to be expected with no diuretic yesterday  -  resuming lasix today 40 BID which was her home dose  Also of note her TSH is a little on the high side so I increased her synthroid from 88 to 100 mcg daily.   The last issue is that she was on zoloft as an OP -  it was stopped appropriately as it can cause SIADH-  daughter would  like something in its place     2. HTN/volume-  responded really well to IV lasix, also maybe some delayed impact of the samsca-  held yesterday due to soft BPs -  resume PO lasix at home dose 40 BID today -  BP is good right now     Saturday    Labs: Basic Metabolic Panel: Recent Labs  Lab 03/13/22 1548 03/14/22 0430 03/14/22 1839 03/16/22 1224 03/16/22 1601 03/17/22 0348  NA 120* 123*   < > 132* 131* 129*  K 4.9 4.6   < > 3.9 4.2 4.1  CL 85* 88*   < > 93* 92* 93*  CO2 25 26   < > 29 29 27   GLUCOSE 173* 135*   < > 103* 139* 140*  BUN 20 23   < > 31* 31* 27*  CREATININE 0.88 0.84   < > 1.01* 1.11* 1.05*  CALCIUM 9.0 8.6*   < > 8.5* 8.4* 8.6*  PHOS 4.0 4.4  --   --   --  3.9   < > = values in this interval not displayed.   Liver Function Tests: Recent Labs  Lab  03/14/22 1839 03/15/22 0512 03/16/22 1224 03/17/22 0348  AST 101* 90* 62*  --   ALT 103* 101* 83*  --   ALKPHOS 145* 129* 114  --   BILITOT 0.7 0.9 0.7  --   PROT 6.3* 5.9* 5.8*  --   ALBUMIN 3.6 3.3* 3.3* 3.5   Recent Labs  Lab 03/10/22 1430  LIPASE 40   No results for input(s): "AMMONIA" in the last 168 hours. CBC: Recent Labs  Lab 03/10/22 1430 03/11/22 0441 03/12/22 0225 03/13/22 0355 03/14/22 0430 03/16/22 0431  WBC 7.0 7.4 7.1 8.0 7.9 7.6  NEUTROABS 5.2  --   --   --   --   --   HGB 12.7 11.2* 11.4* 12.4 11.9* 12.0  HCT 36.0 32.5* 33.4* 36.4 34.3* 34.9*  MCV 91.6 93.1 94.6 94.3 93.7 94.3  PLT 263 258 244 253 242 237   Cardiac Enzymes: No results for input(s): "CKTOTAL", "CKMB", "CKMBINDEX", "TROPONINI" in the last 168 hours. CBG: Recent Labs  Lab 03/16/22 0802 03/16/22 1152 03/16/22 1620 03/16/22 2040 03/17/22 0744  GLUCAP 172* 95 129* 111* 134*    Iron Studies: No results for input(s): "IRON", "TIBC", "TRANSFERRIN", "FERRITIN" in the last 72 hours. Studies/Results: No results found. Medications: Infusions:   Scheduled Medications:  aspirin EC  81 mg Oral  Daily   Chlorhexidine Gluconate Cloth  6 each Topical Daily   enoxaparin (LOVENOX) injection  30 mg Subcutaneous Q24H   famotidine  20 mg Oral Daily   feeding supplement (GLUCERNA SHAKE)  237 mL Oral TID BM   furosemide  40 mg Oral BID   insulin aspart  0-5 Units Subcutaneous QHS   insulin aspart  0-9 Units Subcutaneous TID WC   levothyroxine  100 mcg Oral Q0600   metoprolol succinate  12.5 mg Oral Daily   spironolactone  12.5 mg Oral Daily    have reviewed scheduled and prn medications.  Physical Exam: General: remains bright and interactive  Heart: RRR Lungs: mostly clear Abdomen: soft, non tender Extremities: min edema now    03/17/2022,7:56 AM  LOS: 7 days

## 2022-03-17 NOTE — Plan of Care (Signed)
  Problem: Acute Rehab PT Goals(only PT should resolve) Goal: Pt Will Go Supine/Side To Sit Outcome: Progressing Flowsheets (Taken 03/17/2022 1240) Pt will go Supine/Side to Sit: with modified independence Goal: Patient Will Transfer Sit To/From Stand Outcome: Progressing Flowsheets (Taken 03/17/2022 1240) Patient will transfer sit to/from stand: with modified independence Goal: Pt Will Transfer Bed To Chair/Chair To Bed Outcome: Progressing Flowsheets (Taken 03/17/2022 1240) Pt will Transfer Bed to Chair/Chair to Bed:  with supervision  with modified independence Goal: Pt Will Ambulate Outcome: Progressing Flowsheets (Taken 03/17/2022 1240) Pt will Ambulate:  with modified independence  25 feet  with rolling walker   Alan Mulder, SPT

## 2022-03-17 NOTE — Care Management Important Message (Signed)
Important Message  Patient Details  Name: Erin Herman MRN: [id number] Date of Birth: [birth year]   Medicare Important Message Given:  Yes     Corey Harold 03/17/2022, 12:16 PM

## 2022-03-17 NOTE — Hospital Course (Signed)
88 y.o. female with medical history significant for systolic and diastolic CHF, CABG, left bundle branch block, diabetes mellitus, hypertension. Patient presented to the ED with complaints of nausea ongoing for about 5 days now.  1 episode of vomiting at onset of nausea.  Over the past 5 days, reports mild confusion over the past 5 days, that patient is taking longer to respond to questions and has been having some gait abnormality.  No headaches.  No loss of consciousness or seizures.  At baseline has no significant memory problems. No diarrhea, no abdominal pain. Daughter reports patient drinks a good amount of water, but cannot quantify exactly.  She takes Lasix 40 mg twice daily.  She has chronic poor oral intake. Patient intermittently has difficulty voiding, but she denies any pain with urination or other urinary symptoms.  She was diagnosed with UTI 2 days ago and is completing a 3 day course of ciprofloxacin, last dose tonight.   ED Course: Stable vitals.  Sodium 119.  Potassium 2.2.  Magnesium 1.6.  UA with positive leukocytes.  CT abdomen and pelvis shows markedly distended urinary bladder, without other acute abnormality. 1.5 L bolus given.  Hospitalist admit for hyponatremia and hypokalemia

## 2022-03-17 NOTE — TOC Progression Note (Addendum)
Transition of Care [facility]) - Progression Note    Patient Details  Name: Erin Herman MRN: [id number] Date of Birth: 1927-09-23  Transition of Care [facility] CM/SW Contact  Karn Cassis, [place] Phone Number: 03/17/2022, 11:14 AM  Clinical Narrative:  PT evaluated pt and recommend SNF. Discussed with pt's daughter, Erin Herman who states that pt has 24/7 caregivers at home and they plan on pt returning home. They are open to home health and request [facility]. HHPT/RN referred and accepted by [facility] with Frances Furbish. TOC will continue to follow.      Expected Discharge Plan: Home w Home Health Services Barriers to Discharge: Continued Medical Work up  Expected Discharge Plan and Services Expected Discharge Plan: Home w Home Health Services In-house Referral: Clinical Social Work   Post Acute Care Choice: Home Health Living arrangements for the past 2 months: Single Family Home                           HH Arranged: RN, PT HH Agency: [facility] Date [facility] Agency Contacted: 03/12/22   Representative spoke with at [facility] Agency: Kandee Keen   Social Determinants of Health (SDOH) Interventions    Readmission Risk Interventions     No data to display

## 2022-03-17 NOTE — Progress Notes (Signed)
PROGRESS NOTE   Erin Herman  EHM:[id number] DOB: 04/07/1928 DOA: 03/10/2022 PCP: Monico Blitz, MD   Chief Complaint  Patient presents with   Nausea   Level of care: Telemetry  Brief Admission History:  88 y.o. female with medical history significant for systolic and diastolic CHF, CABG, left bundle branch block, diabetes mellitus, hypertension. Patient presented to the ED with complaints of nausea ongoing for about 5 days now.  1 episode of vomiting at onset of nausea.  Over the past 5 days, reports mild confusion over the past 5 days, that patient is taking longer to respond to questions and has been having some gait abnormality.  No headaches.  No loss of consciousness or seizures.  At baseline has no significant memory problems. No diarrhea, no abdominal pain. Daughter reports patient drinks a good amount of water, but cannot quantify exactly.  She takes Lasix 40 mg twice daily.  She has chronic poor oral intake. Patient intermittently has difficulty voiding, but she denies any pain with urination or other urinary symptoms.  She was diagnosed with UTI 2 days ago and is completing a 3 day course of ciprofloxacin, last dose tonight.   ED Course: Stable vitals.  Sodium 119.  Potassium 2.2.  Magnesium 1.6.  UA with positive leukocytes.  CT abdomen and pelvis shows markedly distended urinary bladder, without other acute abnormality. 1.5 L bolus given.  Hospitalist admit for hyponatremia and hypokalemia   Assessment and Plan:  acute symptomatic hyponatremia Sodium 119 on admission.   3 months ago sodium was 139.  Baseline ~mid 130s.  -Initially appeared dehydrated and volume depleted on admission due to poor oral intake and spironolactone and Lasix use -Prior to admission patient had occasional episodes of nausea vomiting and diarrhea but not enough to explain degree of electrolyte derangement by just GI losses -Mentation already improved back to baseline as per patient's daughter  [name] who is at bedside 03/16/22 -Repeat serum osm (was 257 on 03/10/2022, 265 on 03/14/22) -Repeat urine sodium osmolarity (was 144 on 03/10/2022, 426 on 03/13/22 ) TSH was 4.5 Uric Acid random and urine is 25 -Random sodium urine < 10 (it was 11 on 03/10/2022) -a.m. cortisol 14.6 -After IV fluids patient is no longer dehydrated, actually concerns about possible volume overload at this time -I requested official nephrology consultation from Dr. Moshe Cipro--- please see full consult note dated 03/15/2022 Received Tolvaptan 15 mg x 1 on 03/14/22 -Received IV Lasix and Aldactone on 03/15/2022--sodium levels improved significantly but developed hypotension required IV fluid bolus for persistent hypotension -Per nephrologist no further Lasix on 03/16/2022 -May restart oral Lasix on 03/17/2022 -Continue serial sodium level checks----   acute on chronic combined systolic and diastolic congestive heart failure ([place]) - Last echo 10/2021, EF of 35%, with grade 3 DD- restrictive. -03/16/22 -Wt gain and Volume overload concerns   -IV Lasix and Aldactone as above #1 -Daily weights and fluid input and output monitoring   chronically prolonged QT interval Prolonged at 531, chronic.  -Replaced electrolytes   Hyperkalemia/hypomagnesemia - Treated and resolved  -Normalized with Rx -Lasix and Aldactone as above #1   5)Controlled, type 2 Diabetes Mellitus  -A1c 6.6 reflecting good diabetic control PTA -Use Novolog/Humalog Sliding scale insulin with Accu-Cheks/Fingersticks as ordered  -Continue to hold metformin while inpatient    6)CAD, NATIVE VESSEL History of CABG 2006 and subsequent stent placement 2020.   -Chest pain-free, continue aspirin and metoprolol -Hold isosorbide due to soft BP   7)Essential hypertension, benign Stable. -Stopped  Imdur due to soft BP,  -Continue metoprolol due to tachycardia   8) acute on chronic urinary retention--patient has chronic urinary retention,  usually [facility]gy[facility] -Voiding difficulties improving with Flomax -in and out catheterization as needed   9)Social/Ethics--- plan of care and directives discussed with [name] (daughter Ms Metro Kung) and granddaughter [name]- -family will like to have further conversations amongst themselves prior to making a final decision -Full code for now -Overall prognosis is not great given advanced age, low EF and other comorbid conditions   10) anxiety disorder --- Zoloft was stopped due to hyponatremia; will try buspirone 7.5 mg BID, titrate as tolerated  -May use Xanax as needed    11)Transaminitis--suspect this may be related to hepatic congestion in the setting of acute on chronic combined CHF exacerbation -     Latest Ref Rng & Units 03/16/2022   12:24 PM 03/15/2022    5:12 AM 03/14/2022    6:39 PM  Hepatic Function  Total Protein 6.5 - 8.1 g/dL 5.8  5.9  6.3   Albumin 3.5 - 5.0 g/dL 3.3  3.3  3.6   AST 15 - 41 U/L 62  90  101   ALT 0 - 44 U/L 83  101  103   Alk Phosphatase 38 - 126 U/L 114  129  145   Total Bilirubin 0.3 - 1.2 mg/dL 0.7  0.9  0.7   Bilirubin, Direct 0.0 - 0.2 mg/dL     0.2    LFTs trending down with improvement in hepatic congestion/CHF   12) generalized weakness/deconditioning--- get PT eval  DVT prophylaxis: SCDs, enoxaparin Code Status: Full  Family Communication: daughter at bedside  Disposition: Status is: Inpatient Remains inpatient appropriate because: intensity   Consultants:   Procedures:   Antimicrobials:    Subjective: Pt reports anxiety; feels terrible lying in bed all the time.  Objective: Vitals:   03/17/22 0851 03/17/22 0920 03/17/22 1335 03/17/22 1428  BP:   (!) 114/56 101/70  Pulse: (!) 117  81 80  Resp:   16 16  Temp:   (!) 97.3 F (36.3 C) (!) 97.5 F (36.4 C)  TempSrc:   Oral Oral  SpO2:  100% 97%   Weight:      Height:        Intake/Output Summary (Last 24 hours) at 03/17/2022  1610 Last data filed at 03/17/2022 1525 Gross per 24 hour  Intake 957 ml  Output 1550 ml  Net -593 ml   Filed Weights   03/14/22 1824 03/15/22 0544 03/16/22 2029  Weight: (S) 65.8 kg (S) 67 kg 63.8 kg   Examination:  General exam: Appears calm and comfortable  Respiratory system: Clear to auscultation. Respiratory effort normal. Cardiovascular system: normal S1 & S2 heard. No JVD, murmurs, rubs, gallops or clicks. No pedal edema. Gastrointestinal system: Abdomen is nondistended, soft and nontender. No organomegaly or masses felt. Normal bowel sounds heard. Central nervous system: Alert and oriented. No focal neurological deficits. Extremities: Symmetric 5 x 5 power. Skin: No rashes, lesions or ulcers. Psychiatry: Judgement and insight appear normal. Mood & affect appropriate.   Data Reviewed: I have personally reviewed following labs and imaging studies  CBC: Recent Labs  Lab 03/11/22 0441 03/12/22 0225 03/13/22 0355 03/14/22 0430 03/16/22 0431  WBC 7.4 7.1 8.0 7.9 7.6  HGB 11.2* 11.4* 12.4 11.9* 12.0  HCT 32.5* 33.4* 36.4 34.3* 34.9*  MCV 93.1 94.6 94.3 93.7 94.3  PLT 258 244 253  242 182    Basic Metabolic Panel: Recent Labs  Lab 03/11/22 0441 03/12/22 0225 03/13/22 0355 03/13/22 1548 03/14/22 0430 03/14/22 1839 03/15/22 1657 03/16/22 0431 03/16/22 1224 03/16/22 1601 03/17/22 0348  NA 128*   < > 122* 120* 123*   < > 124* 132* 132* 131* 129*  K 3.6   < > 5.8* 4.9 4.6   < > 3.9 3.3* 3.9 4.2 4.1  CL 84*   < > 87* 85* 88*   < > 86* 92* 93* 92* 93*  CO2 36*   < > _0 < > _1 GLUCOSE 89   < > 159* 173* 135*   < > 134* 147* 103* 139* 140*  BUN 15   < > _2 < > 32* 32* 31* 31* 27*  CREATININE 0.99   < > 0.87 0.88 0.84   < > 1.07* 1.02* 1.01* 1.11* 1.05*  CALCIUM 8.4*   < > 8.9 9.0 8.6*   < > 8.4* 8.4* 8.5* 8.4* 8.6*  MG 1.9  --   --   --   --   --   --   --   --   --   --   PHOS  --   --  3.3 4.0 4.4  --   --   --   --   --  3.9   < >  = values in this interval not displayed.    CBG: Recent Labs  Lab 03/16/22 1620 03/16/22 2040 03/17/22 0744 03/17/22 1130 03/17/22 1524  GLUCAP 129* 111* 134* 149* 160*    Recent Results (from the past 240 hour(s))  MRSA Next Gen by PCR, Nasal     Status: None   Collection Time: 03/10/22  6:37 PM   Specimen: Nasal Mucosa; Nasal Swab  Result Value Ref Range Status   MRSA by PCR Next Gen NOT DETECTED NOT DETECTED Final    Comment: (NOTE) The GeneXpert MRSA Assay (FDA approved for NASAL specimens only), is one component of a comprehensive MRSA colonization surveillance program. It is not intended to diagnose MRSA infection nor to guide or monitor treatment for MRSA infections. Test performance is not FDA approved in patients less than 50 years old. Performed at [facility], [address] [place], [place] [address]      Radiology Studies: No results found.  Scheduled Meds:  aspirin EC  81 mg Oral Daily   busPIRone  7.5 mg Oral BID   Chlorhexidine Gluconate Cloth  6 each Topical Daily   enoxaparin (LOVENOX) injection  30 mg Subcutaneous Q24H   famotidine  20 mg Oral Daily   feeding supplement (GLUCERNA SHAKE)  237 mL Oral TID BM   furosemide  40 mg Oral BID   insulin aspart  0-5 Units Subcutaneous QHS   insulin aspart  0-9 Units Subcutaneous TID WC   levothyroxine  100 mcg Oral Q0600   metoprolol succinate  12.5 mg Oral Daily   spironolactone  12.5 mg Oral Daily   Continuous Infusions:   LOS: 7 days   Time spent: 35 mins  Norrin Shreffler Wynetta Emery, MD How to contact the [facility] Attending or Consulting provider [place] or covering provider during after hours [place], for this patient?  Check the care team in [facility] and look for a) attending/consulting TR[facility] provider listed and b) the [facility] team listed Log into [email or web address] and use [place]'s universal password to access.  If you do not have the password, please contact the hospital operator. Locate the [facility] provider you are looking  for under [facility] and page to a number that you can be directly reached. If you still have difficulty reaching the provider, please page the [facility] (Director on Call) for the Hospitalists listed on amion for assistance.  03/17/2022, 4:10 PM

## 2022-03-17 NOTE — Evaluation (Signed)
Physical Therapy Evaluation Patient Details Name: Erin Herman MRN: [id number] DOB: January 02, 1928 Today's Date: 03/17/2022  History of Present Illness  Erin Herman is a 88 y.o. female with medical history significant for systolic and diastolic CHF, CABG, left bundle branch block, diabetes mellitus, hypertension. Patient presented to the ED with complaints of nausea ongoing for about 5 days now. 1 episode of vomiting at onset of nausea. Over the past 5 days, reports mild confusion over the past 5 days, that patient is taking longer to respond to questions and has been having some gait abnormality.   Clinical Impression  Patient able to sit up at bedside with increased time and verbal cueing to scoot to EOB with good carryover. Patient then able to transfer to chair and ambulate ~12 feet in room with min guard/RW, limited mostly due to generalized weakness and c/o fatigue. Patient tolerated sitting up in chair after therapy with daughter in room. Patient will benefit from continued skilled physical therapy in hospital and recommended venue below to increase strength, balance, endurance for safe ADLs and gait.      Recommendations for follow up therapy are one component of a multi-disciplinary discharge planning process, led by the attending physician.  Recommendations may be updated based on patient status, additional functional criteria and insurance authorization.  Follow Up Recommendations Home health PT      Assistance Recommended at Discharge Set up Supervision/Assistance  Patient can return home with the following  A little help with walking and/or transfers;A little help with bathing/dressing/bathroom;Assistance with cooking/housework;Help with stairs or ramp for entrance    Equipment Recommendations None recommended by PT  Recommendations for Other Services       Functional Status Assessment Patient has had a recent decline in their functional status and demonstrates the  ability to make significant improvements in function in a reasonable and predictable amount of time.     Precautions / Restrictions Precautions Precautions: Fall Restrictions Weight Bearing Restrictions: No      Mobility  Bed Mobility Overal bed mobility: Modified Independent             General bed mobility comments: increased time, verbally cued for scooting to EOB with good carryover    Transfers Overall transfer level: Needs assistance Equipment used: Rolling walker (2 wheels) Transfers: Sit to/from Stand, Bed to chair/wheelchair/BSC Sit to Stand: Min guard   Step pivot transfers: Min guard       General transfer comment: patient able to transfer to chair with min guard/RW and increased time    Ambulation/Gait Ambulation/Gait assistance: Min guard Gait Distance (Feet): 12 Feet Assistive device: Rolling walker (2 wheels) Gait Pattern/deviations: Decreased step length - right, Decreased step length - left, Trunk flexed, Decreased stride length Gait velocity: slow     General Gait Details: patient able to take a few slow effortful steps in room with RW/min guard before having to sit due to generalized weakness and c/o fatigue  Stairs            Wheelchair Mobility    Modified Rankin (Stroke Patients Only)       Balance Overall balance assessment: Needs assistance Sitting-balance support: No upper extremity supported, Feet supported Sitting balance-Leahy Scale: Good Sitting balance - Comments: seated EOB   Standing balance support: Bilateral upper extremity supported, During functional activity, Reliant on assistive device for balance Standing balance-Leahy Scale: Good Standing balance comment: fair/good with RW  Pertinent Vitals/Pain Pain Assessment Pain Assessment: No/denies pain    Home Living Family/patient expects to be discharged to:: Private residence Living Arrangements: Alone Available Help at  Discharge: Family;Available 24 hours/day Type of Home: House Home Access: Stairs to enter Entrance Stairs-Rails: Can reach both;Left;Right Entrance Stairs-Number of Steps: 3   Home Layout: One level Home Equipment: Conservation officer, nature (2 wheels);Cane - single point;BSC/3in1;Shower seat;Grab bars - tub/shower;Rollator (4 wheels);Cane - quad Additional Comments: Patient lives alone but daughter is retired and able to help out 24/7    Prior Function Prior Level of Function : Independent/Modified Independent             Mobility Comments: Sales executive, was using quad cane but has recently switched to RW, uses rollator for longer distances ADLs Comments: Independent, has cleaning lady to clean house     Hand Dominance   Dominant Hand: Right    Extremity/Trunk Assessment   Upper Extremity Assessment Upper Extremity Assessment: Generalized weakness    Lower Extremity Assessment Lower Extremity Assessment: Generalized weakness    Cervical / Trunk Assessment Cervical / Trunk Assessment: Kyphotic  Communication   Communication: No difficulties  Cognition Arousal/Alertness: Awake/alert Behavior During Therapy: WFL for tasks assessed/performed Overall Cognitive Status: Within Functional Limits for tasks assessed                                          General Comments      Exercises     Assessment/Plan    PT Assessment Patient needs continued PT services  PT Problem List Decreased strength;Decreased activity tolerance;Decreased balance;Decreased mobility       PT Treatment Interventions DME instruction;Balance training;Gait training;Stair training;Functional mobility training;Patient/family education;Therapeutic activities;Therapeutic exercise    PT Goals (Current goals can be found in the Care Plan section)  Acute Rehab PT Goals Patient Stated Goal: return home with family to assist PT Goal Formulation: With patient/family Time For Goal  Achievement: 03/31/22 Potential to Achieve Goals: Good    Frequency Min 3X/week     Co-evaluation               AM-PAC PT "6 Clicks" Mobility  Outcome Measure Help needed turning from your back to your side while in a flat bed without using bedrails?: None Help needed moving from lying on your back to sitting on the side of a flat bed without using bedrails?: None Help needed moving to and from a bed to a chair (including a wheelchair)?: None Help needed standing up from a chair using your arms (e.g., wheelchair or bedside chair)?: None Help needed to walk in hospital room?: A Little Help needed climbing 3-5 steps with a railing? : A Lot 6 Click Score: 21    End of Session   Activity Tolerance: Patient tolerated treatment well;Patient limited by fatigue Patient left: in chair;with family/visitor present;with call bell/phone within reach;with chair alarm set Nurse Communication: Mobility status PT Visit Diagnosis: Unsteadiness on feet (R26.81);Other abnormalities of gait and mobility (R26.89);Muscle weakness (generalized) (M62.81)    Time: [id number] PT Time Calculation (min) (ACUTE ONLY): 18 min   Charges:   PT Evaluation $PT Eval Low Complexity: 1 Low PT Treatments $Therapeutic Activity: 8-22 mins        Zigmund Gottron, SPT

## 2022-03-18 DIAGNOSIS — R9431 Abnormal electrocardiogram [ECG] [EKG]: Secondary | ICD-10-CM | POA: Diagnosis not present

## 2022-03-18 DIAGNOSIS — E871 Hypo-osmolality and hyponatremia: Secondary | ICD-10-CM | POA: Diagnosis not present

## 2022-03-18 DIAGNOSIS — E876 Hypokalemia: Secondary | ICD-10-CM | POA: Diagnosis not present

## 2022-03-18 LAB — BASIC METABOLIC PANEL
Anion gap: 10 (ref 5–15)
BUN: 27 mg/dL — ABNORMAL HIGH (ref 8–23)
CO2: 27 mmol/L (ref 22–32)
Calcium: 8.8 mg/dL — ABNORMAL LOW (ref 8.9–10.3)
Chloride: 97 mmol/L — ABNORMAL LOW (ref 98–111)
Creatinine, Ser: 1.11 mg/dL — ABNORMAL HIGH (ref 0.44–1.00)
GFR, Estimated: 47 mL/min — ABNORMAL LOW (ref >60)
Glucose, Bld: 118 mg/dL — ABNORMAL HIGH (ref 70–99)
Potassium: 4 mmol/L (ref 3.5–5.1)
Sodium: 134 mmol/L — ABNORMAL LOW (ref 135–145)

## 2022-03-18 LAB — GLUCOSE, CAPILLARY
Glucose-Capillary: 151 mg/dL — ABNORMAL HIGH (ref 70–99)
Glucose-Capillary: 119 mg/dL — ABNORMAL HIGH (ref 70–99)
Glucose-Capillary: 161 mg/dL — ABNORMAL HIGH (ref 70–99)

## 2022-03-18 MED ORDER — ORAL CARE MOUTH RINSE: 15.0000 mL | OROMUCOSAL | Status: DC | PRN

## 2022-03-18 MED ORDER — FUROSEMIDE 20 MG PO TABS
20.0000 mg | ORAL_TABLET | Freq: Two times a day (BID) | ORAL | Status: DC
  Administered 2022-03-18: 20 mg via ORAL
  Filled 2022-03-18 (×2): qty 1

## 2022-03-18 MED ORDER — ALUM & MAG HYDROXIDE-SIMETH 200-200-20 MG/5ML PO SUSP
30.0000 mL | Freq: Four times a day (QID) | ORAL | Status: DC | PRN
  Administered 2022-03-18 – 2022-03-28 (×3): 30 mL via ORAL
  Filled 2022-03-18 (×3): qty 30

## 2022-03-18 NOTE — Progress Notes (Signed)
PROGRESS NOTE   Erin Herman  VPX:[id number] DOB: 01-Feb-1928 DOA: 03/10/2022 PCP: Kirstie Peri, MD   Chief Complaint  Patient presents with   Nausea   Level of care: Med-Surg  Brief Admission History:  88 y.o. female with medical history significant for systolic and diastolic CHF, CABG, left bundle branch block, diabetes mellitus, hypertension. Patient presented to the ED with complaints of nausea ongoing for about 5 days now.  1 episode of vomiting at onset of nausea.  Over the past 5 days, reports mild confusion over the past 5 days, that patient is taking longer to respond to questions and has been having some gait abnormality.  No headaches.  No loss of consciousness or seizures.  At baseline has no significant memory problems. No diarrhea, no abdominal pain. Daughter reports patient drinks a good amount of water, but cannot quantify exactly.  She takes Lasix 40 mg twice daily.  She has chronic poor oral intake. Patient intermittently has difficulty voiding, but she denies any pain with urination or other urinary symptoms.  She was diagnosed with UTI 2 days ago and is completing a 3 day course of ciprofloxacin, last dose tonight.   ED Course: Stable vitals.  Sodium 119.  Potassium 2.2.  Magnesium 1.6.  UA with positive leukocytes.  CT abdomen and pelvis shows markedly distended urinary bladder, without other acute abnormality. 1.5 L bolus given.  Hospitalist admit for hyponatremia and hypokalemia   Assessment and Plan:  acute symptomatic hyponatremia Sodium 119 on admission.   3 months ago sodium was 139.  Baseline ~mid 130s.  -Initially appeared dehydrated and volume depleted on admission due to poor oral intake and spironolactone and Lasix use -Prior to admission patient had occasional episodes of nausea vomiting and diarrhea but not enough to explain degree of electrolyte derangement by just GI losses -Mentation already improved back to baseline as per patient's daughter  [name] who is at bedside 03/16/22 -Repeat serum osm (was 257 on 03/10/2022, 265 on 03/14/22) -Repeat urine sodium osmolarity (was 144 on 03/10/2022, 426 on 03/13/22 ) TSH was 4.5 Uric Acid random and urine is 25 -Random sodium urine < 10 (it was 11 on 03/10/2022) -a.m. cortisol 14.6 -After IV fluids patient is no longer dehydrated, actually concerns about possible volume overload at this time -I requested official nephrology consultation from Dr. Kathrene Bongo--- please see full consult note dated 03/15/2022 Received Tolvaptan 15 mg x 1 on 03/14/22 -Received IV Lasix and Aldactone on 03/15/2022--sodium levels improved significantly but developed hypotension required IV fluid bolus for persistent hypotension -Per nephrologist no further Lasix on 03/16/2022 -nephrology restarted home oral Lasix on 03/17/2022 with very good urine output -Na improved to 134; lasix reduced to 20 mg BID 11/23    acute on chronic combined systolic and diastolic congestive heart failure (HCC) - Last echo 10/2021, EF of 35%, with grade 3 DD- restrictive. -03/16/22 -Wt gain and Volume overload concerns   -IV Lasix and Aldactone as above #1 -Daily weights and fluid input and output monitoring   chronically prolonged QT interval Prolonged at 531, chronic.  -Replaced electrolytes   Hyperkalemia/hypomagnesemia - Treated and resolved  -Normalized with Rx -Lasix and Aldactone as above #1   5)Controlled, type 2 Diabetes Mellitus  -A1c 6.6 reflecting good diabetic control PTA -Use Novolog/Humalog Sliding scale insulin with Accu-Cheks/Fingersticks as ordered  -Continue to hold metformin while inpatient    6)CAD, NATIVE VESSEL History of CABG 2006 and subsequent stent placement 2020.   -Chest pain-free, continue aspirin and metoprolol -  Hold isosorbide due to soft BP   7)Essential hypertension, benign Stable. -Stopped Imdur due to soft BP,  -Continue metoprolol due to tachycardia   8) acute on chronic urinary  retention--patient has chronic urinary retention, usually sees [facility] at [facility] -Voiding difficulties improving with Flomax -in and out catheterization as needed   9)Social/Ethics--- plan of care and directives discussed with [name] (daughter [name]) and granddaughter [name]- -family will like to have further conversations amongst themselves prior to making a final decision -Full code for now -Overall prognosis is not great given advanced age, low EF and other comorbid conditions   10) anxiety disorder --- Zoloft was stopped due to hyponatremia; will try buspirone 7.5 mg BID, titrate as tolerated  -May use Xanax as needed    11)Transaminitis--suspect this may be related to hepatic congestion in the setting of acute on chronic combined CHF exacerbation  LFTs trending down with improvement in hepatic congestion/CHF   12) generalized weakness/deconditioning---  PT eval completed 11/22, rec HHPT  DVT prophylaxis: SCDs, enoxaparin Code Status: Full  Family Communication: daughter at bedside  Disposition: Status is: Inpatient Remains inpatient appropriate because: intensity   Consultants:   Procedures:   Antimicrobials:    Subjective: Pt sitting up in chair and reports starting to feel better.  Objective: Vitals:   03/17/22 1428 03/17/22 2057 03/18/22 0405 03/18/22 0642  BP: 101/70 115/64 132/87   Pulse: 80 81 90   Resp: 16 20 20    Temp: (!) 97.5 F (36.4 C) 98.2 F (36.8 C) 97.6 F (36.4 C)   TempSrc: Oral Oral Oral   SpO2:  94% 93%   Weight:    61.2 kg  Height:        Intake/Output Summary (Last 24 hours) at 03/18/2022 1345 Last data filed at 03/18/2022 0634 Gross per 24 hour  Intake 600 ml  Output 4350 ml  Net -3750 ml   Filed Weights   03/15/22 0544 03/16/22 2029 03/18/22 0642  Weight: (S) 67 kg 63.8 kg 61.2 kg   Examination:  General exam: Appears calm and comfortable sitting up in chair. NAD.  Respiratory system:  Clear to auscultation. Respiratory effort normal. Cardiovascular system: normal S1 & S2 heard. No JVD, murmurs, rubs, gallops or clicks. No pedal edema. Gastrointestinal system: Abdomen is nondistended, soft and nontender. No organomegaly or masses felt. Normal bowel sounds heard. Central nervous system: Alert and oriented. No focal neurological deficits. Extremities: Symmetric 5 x 5 power. Skin: No rashes, lesions or ulcers. Psychiatry: Judgement and insight appear normal. Mood & affect appropriate.   Data Reviewed: I have personally reviewed following labs and imaging studies  CBC: Recent Labs  Lab 03/12/22 0225 03/13/22 0355 03/14/22 0430 03/16/22 0431  WBC 7.1 8.0 7.9 7.6  HGB 11.4* 12.4 11.9* 12.0  HCT 33.4* 36.4 34.3* 34.9*  MCV 94.6 94.3 93.7 94.3  PLT 244 253 242 237    Basic Metabolic Panel: Recent Labs  Lab 03/13/22 0355 03/13/22 1548 03/14/22 0430 03/14/22 1839 03/16/22 0431 03/16/22 1224 03/16/22 1601 03/17/22 0348 03/18/22 0441  NA 122* 120* 123*   < > 132* 132* 131* 129* 134*  K 5.8* 4.9 4.6   < > 3.3* 3.9 4.2 4.1 4.0  CL 87* 85* 88*   < > 92* 93* 92* 93* 97*  CO2 25 25 26    < > 30 29 29 27 27   GLUCOSE 159* 173* 135*   < > 147* 103* 139* 140* 118*  BUN 19 20  23   < > 32* 31* 31* 27* 27*  CREATININE 0.87 0.88 0.84   < > 1.02* 1.01* 1.11* 1.05* 1.11*  CALCIUM 8.9 9.0 8.6*   < > 8.4* 8.5* 8.4* 8.6* 8.8*  PHOS 3.3 4.0 4.4  --   --   --   --  3.9  --    < > = values in this interval not displayed.    CBG: Recent Labs  Lab 03/17/22 1130 03/17/22 1524 03/17/22 2049 03/18/22 0744 03/18/22 1131  GLUCAP 149* 160* 137* 151* 119*    Recent Results (from the past 240 hour(s))  MRSA Next Gen by PCR, Nasal     Status: None   Collection Time: 03/10/22  6:37 PM   Specimen: Nasal Mucosa; Nasal Swab  Result Value Ref Range Status   MRSA by PCR Next Gen NOT DETECTED NOT DETECTED Final    Comment: (NOTE) The GeneXpert MRSA Assay (FDA approved for NASAL  specimens only), is one component of a comprehensive MRSA colonization surveillance program. It is not intended to diagnose MRSA infection nor to guide or monitor treatment for MRSA infections. Test performance is not FDA approved in patients less than 24 years old. Performed at [facility], [address], [place], [place] [address]      Radiology Studies: No results found.  Scheduled Meds:  aspirin EC  81 mg Oral Daily   busPIRone  7.5 mg Oral BID   Chlorhexidine Gluconate Cloth  6 each Topical Daily   enoxaparin (LOVENOX) injection  30 mg Subcutaneous Q24H   famotidine  20 mg Oral Daily   feeding supplement (GLUCERNA SHAKE)  237 mL Oral TID BM   furosemide  20 mg Oral BID   insulin aspart  0-5 Units Subcutaneous QHS   insulin aspart  0-9 Units Subcutaneous TID WC   levothyroxine  100 mcg Oral Q0600   metoprolol succinate  12.5 mg Oral Daily   spironolactone  12.5 mg Oral Daily   Continuous Infusions:   LOS: 8 days   Time spent: 35 mins  Sun Kihn Laural Benes, MD How to contact the [facility] Attending or Consulting provider 7A - 7P or covering provider during after hours 7P -7A, for this patient?  Check the care team in [facility] and look for a) attending/consulting TRH provider listed and b) the [facility] team listed Log into [email or web address] and use [place]'s universal password to access. If you do not have the password, please contact the hospital operator. Locate the [facility] provider you are looking for under [facility] and page to a number that you can be directly reached. If you still have difficulty reaching the provider, please page the [facility] (Director on Call) for the Hospitalists listed on amion for assistance.  03/18/2022, 1:45 PM

## 2022-03-18 NOTE — Progress Notes (Signed)
Subjective:  somehow made nearly 5 liters of urine on oral lasix ? Sodium came up again -  now 134-  up in chair  Objective Vital signs in last 24 hours: Vitals:   03/17/22 1428 03/17/22 2057 03/18/22 0405 03/18/22 0642  BP: 101/70 115/64 132/87   Pulse: 80 81 90   Resp: 16 20 20    Temp: (!) 97.5 F (36.4 C) 98.2 F (36.8 C) 97.6 F (36.4 C)   TempSrc: Oral Oral Oral   SpO2:  94% 93%   Weight:    61.2 kg  Height:       Weight change: -2.565 kg  Intake/Output Summary (Last 24 hours) at 03/18/2022 0846 Last data filed at 03/18/2022 03/20/2022 Gross per 24 hour  Intake 1317 ml  Output 4950 ml  Net -3633 ml    Assessment/Plan: 88 year old WF with history of biventricular CHF presented with hyponatremia felt to be dry-  initially given IVF, sodium up then down-  urine osm c/w SIADH. Given samsca but without major change in sodium -   given IVF and not fluid restricted since admit so then was volume overloaded   1.  Hyponatremia-  complex case.  Initially she may have been dry on home lasix and that was hypovolemic hyponatremia- also some SIADH.  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 Had a huge improvement with big diuresis from Monday to Tuesday( also might have been delayed effect from samsca)  a little rebound to be expected with no diuretic yesterday  -  resumed lasix 11/22 40 BID which was her home dose-  had a great response-  sodium up again quite abit today -  will dec to 20 BID  Also of note her TSH was a little on the high side so I increased her synthroid from 88 to 100 mcg daily.   The last issue is that she was on zoloft as an OP -  it was stopped appropriately as it can cause SIADH-  daughter would  like something in its place -  has been started on busprone    2. HTN/volume-  responded really well to IV lasix, also maybe some delayed impact of the samsca-  held 11/21 due to soft BPs -  resume PO lasix at home dose 40 BID 11/22 -  BP is good right now -  I think am going to decreased to 20 BID given her incredible response to 40 BID    12/22    Labs: Basic Metabolic Panel: Recent Labs  Lab 03/13/22 1548 03/14/22 0430 03/14/22 1839 03/16/22 1601 03/17/22 0348 03/18/22 0441  NA 120* 123*   < > 131* 129* 134*  K 4.9 4.6   < > 4.2 4.1 4.0  CL 85* 88*   < > 92* 93* 97*  CO2 25 26   < > 29 27 27   GLUCOSE 173* 135*   < > 139* 140* 118*  BUN 20 23   < > 31* 27* 27*  CREATININE 0.88 0.84   < > 1.11* 1.05* 1.11*  CALCIUM 9.0 8.6*   < > 8.4* 8.6* 8.8*  PHOS 4.0 4.4  --   --  3.9  --    < > = values in this interval not displayed.   Liver Function Tests: Recent Labs  Lab 03/14/22 1839 03/15/22 0512 03/16/22 1224 03/17/22 0348  AST 101* 90* 62*  --   ALT 103* 101* 83*  --   ALKPHOS 145* 129* 114  --   BILITOT 0.7 0.9 0.7  --   PROT 6.3* 5.9* 5.8*  --   ALBUMIN 3.6 3.3* 3.3* 3.5   No results for input(s): "LIPASE", "AMYLASE" in the last 168 hours.  No results for input(s): "AMMONIA" in the last 168 hours. CBC: Recent Labs  Lab 03/12/22 0225 03/13/22 0355 03/14/22 0430 03/16/22 0431  WBC 7.1 8.0 7.9 7.6  HGB 11.4* 12.4 11.9* 12.0  HCT 33.4* 36.4 34.3* 34.9*  MCV 94.6 94.3 93.7 94.3  PLT 244 253 242 237   Cardiac Enzymes: No results for input(s): "CKTOTAL", "CKMB", "CKMBINDEX", "TROPONINI" in the last 168 hours. CBG: Recent Labs  Lab 03/17/22 0744 03/17/22 1130 03/17/22 1524 03/17/22 2049 03/18/22 0744  GLUCAP 134* 149* 160* 137* 151*    Iron Studies: No results for input(s): "IRON", "TIBC", "TRANSFERRIN", "FERRITIN" in the last 72 hours. Studies/Results: No results found. Medications: Infusions:   Scheduled Medications:  aspirin  EC  81 mg Oral Daily   busPIRone  7.5 mg Oral BID   Chlorhexidine Gluconate Cloth  6 each Topical Daily   enoxaparin (LOVENOX) injection  30 mg Subcutaneous Q24H   famotidine  20 mg Oral Daily   feeding supplement (GLUCERNA SHAKE)  237 mL Oral TID BM   furosemide  40 mg Oral BID   insulin aspart  0-5 Units Subcutaneous QHS   insulin aspart  0-9 Units Subcutaneous TID WC   levothyroxine  100 mcg Oral Q0600   metoprolol succinate  12.5 mg Oral Daily   spironolactone  12.5 mg Oral Daily    have reviewed scheduled and prn medications.  Physical Exam: General: remains bright and interactive  Heart: RRR Lungs: mostly clear Abdomen: soft, non tender Extremities: min edema now    03/18/2022,8:46 AM  LOS: 8 days

## 2022-03-19 DIAGNOSIS — E876 Hypokalemia: Secondary | ICD-10-CM | POA: Diagnosis not present

## 2022-03-19 DIAGNOSIS — R9431 Abnormal electrocardiogram [ECG] [EKG]: Secondary | ICD-10-CM | POA: Diagnosis not present

## 2022-03-19 DIAGNOSIS — E871 Hypo-osmolality and hyponatremia: Secondary | ICD-10-CM | POA: Diagnosis not present

## 2022-03-19 LAB — BASIC METABOLIC PANEL
Anion gap: 12 (ref 5–15)
BUN: 31 mg/dL — ABNORMAL HIGH (ref 8–23)
CO2: 28 mmol/L (ref 22–32)
Calcium: 9.1 mg/dL (ref 8.9–10.3)
Chloride: 94 mmol/L — ABNORMAL LOW (ref 98–111)
Creatinine, Ser: 1.17 mg/dL — ABNORMAL HIGH (ref 0.44–1.00)
GFR, Estimated: 44 mL/min — ABNORMAL LOW (ref >60)
Glucose, Bld: 132 mg/dL — ABNORMAL HIGH (ref 70–99)
Potassium: 4.1 mmol/L (ref 3.5–5.1)
Sodium: 134 mmol/L — ABNORMAL LOW (ref 135–145)

## 2022-03-19 LAB — GLUCOSE, CAPILLARY
Glucose-Capillary: 155 mg/dL — ABNORMAL HIGH (ref 70–99)
Glucose-Capillary: 154 mg/dL — ABNORMAL HIGH (ref 70–99)
Glucose-Capillary: 150 mg/dL — ABNORMAL HIGH (ref 70–99)
Glucose-Capillary: 122 mg/dL — ABNORMAL HIGH (ref 70–99)
Glucose-Capillary: 137 mg/dL — ABNORMAL HIGH (ref 70–99)

## 2022-03-19 MED ORDER — FUROSEMIDE 40 MG PO TABS
40.0000 mg | ORAL_TABLET | Freq: Two times a day (BID) | ORAL | Status: DC
  Administered 2022-03-19 – 2022-03-21 (×5): 40 mg via ORAL
  Filled 2022-03-19 (×6): qty 1

## 2022-03-19 MED ORDER — ALPRAZOLAM 0.5 MG PO TABS
0.5000 mg | ORAL_TABLET | Freq: Three times a day (TID) | ORAL | Status: DC | PRN
  Administered 2022-03-20 – 2022-03-26 (×7): 0.5 mg via ORAL
  Filled 2022-03-19 (×7): qty 1

## 2022-03-19 MED ORDER — MELATONIN 3 MG PO TABS
6.0000 mg | ORAL_TABLET | Freq: Every day | ORAL | Status: DC
  Administered 2022-03-19: 6 mg via ORAL
  Filled 2022-03-19: qty 2

## 2022-03-19 MED ORDER — SERTRALINE HCL 50 MG PO TABS
50.0000 mg | ORAL_TABLET | Freq: Every day | ORAL | Status: DC
  Administered 2022-03-19 – 2022-03-22 (×3): 50 mg via ORAL
  Filled 2022-03-19 (×4): qty 1

## 2022-03-19 NOTE — Progress Notes (Signed)
PROGRESS NOTE   Erin Herman  TGR:[id number] DOB: [birth year] DOA: 03/10/2022 PCP: Kirstie Peri, MD   Chief Complaint  Patient presents with   Nausea   Level of care: Med-Surg  Brief Admission History:  88 y.o. female with medical history significant for systolic and diastolic CHF, CABG, left bundle branch block, diabetes mellitus, hypertension. Patient presented to the ED with complaints of nausea ongoing for about 5 days now.  1 episode of vomiting at onset of nausea.  Over the past 5 days, reports mild confusion over the past 5 days, that patient is taking longer to respond to questions and has been having some gait abnormality.  No headaches.  No loss of consciousness or seizures.  At baseline has no significant memory problems. No diarrhea, no abdominal pain. Daughter reports patient drinks a good amount of water, but cannot quantify exactly.  She takes Lasix 40 mg twice daily.  She has chronic poor oral intake. Patient intermittently has difficulty voiding, but she denies any pain with urination or other urinary symptoms.  She was diagnosed with UTI 2 days ago and is completing a 3 day course of ciprofloxacin, last dose tonight.   ED Course: Stable vitals.  Sodium 119.  Potassium 2.2.  Magnesium 1.6.  UA with positive leukocytes.  CT abdomen and pelvis shows markedly distended urinary bladder, without other acute abnormality. 1.5 L bolus given.  Hospitalist admit for hyponatremia and hypokalemia   Assessment and Plan:  acute symptomatic hyponatremia Sodium 119 on admission.   3 months ago sodium was 139.  Baseline ~mid 130s.  -Initially appeared dehydrated and volume depleted on admission due to poor oral intake and spironolactone and Lasix use -Prior to admission patient had occasional episodes of nausea vomiting and diarrhea but not enough to explain degree of electrolyte derangement by just GI losses -Mentation already improved back to baseline as per patient's daughter  [name] who is at bedside 03/16/22 -Repeat serum osm (was 257 on 03/10/2022, 265 on 03/14/22) -Repeat urine sodium osmolarity (was 144 on 03/10/2022, 426 on 03/13/22 ) TSH was 4.5 Uric Acid random and urine is 25 -Random sodium urine < 10 (it was 11 on 03/10/2022) -a.m. cortisol 14.6 -I requested official nephrology consultation from Dr. Kathrene Bongo--- please see full consult note dated 03/15/2022 Received Tolvaptan 15 mg x 1 on 03/14/22 -Received IV Lasix and Aldactone on 03/15/2022--sodium levels improved significantly but developed hypotension required IV fluid bolus for persistent hypotension -Per nephrologist no further Lasix on 03/16/2022 -nephrology restarted home oral Lasix on 03/17/2022 with very good urine output -Na improved to 134; lasix reduced to 20 mg BID 11/23, increased back to home dose 40 mg BID on 11/24    acute on chronic combined systolic and diastolic congestive heart failure (HCC) - Last echo 10/2021, EF of 35%, with grade 3 DD- restrictive. -03/16/22 -Wt gain and Volume overload concerns   -IV Lasix and Aldactone as above #1 -Daily weights and fluid input and output monitoring   chronically prolonged QT interval Prolonged at 531, chronic.  -Replaced electrolytes   Hyperkalemia/hypomagnesemia - Treated and resolved  -Normalized with Rx -Lasix and Aldactone as above #1   5)Controlled, type 2 Diabetes Mellitus  -A1c 6.6 reflecting good diabetic control PTA -Use Novolog/Humalog Sliding scale insulin with Accu-Cheks/Fingersticks as ordered  -Continue to hold metformin while inpatient    6)CAD, NATIVE VESSEL History of CABG 2006 and subsequent stent placement 2020.   -Chest pain-free, continue aspirin and metoprolol -Hold isosorbide due to soft BP  7)Essential hypertension, benign Stable. -Stopped Imdur due to soft BP,  -Continue metoprolol due to tachycardia   8) acute on chronic urinary retention--patient has chronic urinary retention, usually sees  [facility]gynecologist [facility] -Voiding difficulties improving with Flomax -in and out catheterization as needed   9)Social/Ethics--- plan of care and directives discussed with [name] (daughter [name]) and granddaughter [name]- -family will like to have further conversations amongst themselves prior to making a final decision -Full code for now -Overall prognosis is not great given advanced age, low EF and other comorbid conditions   10) anxiety disorder --- Zoloft was stopped due to hyponatremia; discussed with nephrology resuming home dose 50 mg daily; monitor sodium.   -May use Xanax as needed    11)Transaminitis--suspect this may be related to hepatic congestion in the setting of acute on chronic combined CHF exacerbation  LFTs trending down with improvement in hepatic congestion/CHF   12) generalized weakness/deconditioning---  PT eval completed 11/24, now rec SNF   DVT prophylaxis: SCDs, enoxaparin Code Status: Full  Family Communication: daughter at bedside Disposition: TOC working on SNF placement  Remains inpatient appropriate because: intensity   Consultants:   Procedures:   Antimicrobials:    Subjective: Pt not sleeping well.  She is anxious and delirious at times.  Objective: Vitals:   03/18/22 2126 03/19/22 0500 03/19/22 0525 03/19/22 1203  BP: 115/84 (!) 124/91  108/78  Pulse: 92 (!) 112  (!) 110  Resp: 14 16  20   Temp: 98.1 F (36.7 C) 97.8 F (36.6 C)  (!) 97.4 F (36.3 C)  TempSrc: Oral Oral  Oral  SpO2: 91% 91%  98%  Weight:   62 kg   Height:        Intake/Output Summary (Last 24 hours) at 03/19/2022 1645 Last data filed at 03/19/2022 03/21/2022 Gross per 24 hour  Intake 100 ml  Output 1150 ml  Net -1050 ml   Filed Weights   03/16/22 2029 03/18/22 0642 03/19/22 0525  Weight: 63.8 kg 61.2 kg 62 kg   Examination:  General exam: Appears calm and comfortable sitting up in chair. NAD.  Respiratory system: Clear to  auscultation. Respiratory effort normal. Cardiovascular system: normal S1 & S2 heard. No JVD, murmurs, rubs, gallops or clicks. No pedal edema. Gastrointestinal system: Abdomen is nondistended, soft and nontender. No organomegaly or masses felt. Normal bowel sounds heard. Central nervous system: Alert and oriented. No focal neurological deficits. Extremities: Symmetric 5 x 5 power. Skin: No rashes, lesions or ulcers. Psychiatry: Judgement and insight appear normal. Mood & affect appropriate.   Data Reviewed: I have personally reviewed following labs and imaging studies  CBC: Recent Labs  Lab 03/13/22 0355 03/14/22 0430 03/16/22 0431  WBC 8.0 7.9 7.6  HGB 12.4 11.9* 12.0  HCT 36.4 34.3* 34.9*  MCV 94.3 93.7 94.3  PLT 253 242 237    Basic Metabolic Panel: Recent Labs  Lab 03/13/22 0355 03/13/22 1548 03/14/22 0430 03/14/22 1839 03/16/22 1224 03/16/22 1601 03/17/22 0348 03/18/22 0441 03/19/22 0438  NA 122* 120* 123*   < > 132* 131* 129* 134* 134*  K 5.8* 4.9 4.6   < > 3.9 4.2 4.1 4.0 4.1  CL 87* 85* 88*   < > 93* 92* 93* 97* 94*  CO2 25 25 26    < > 29 29 27 27 28   GLUCOSE 159* 173* 135*   < > 103* 139* 140* 118* 132*  BUN 19 20 23    < >  31* 31* 27* 27* 31*  CREATININE 0.87 0.88 0.84   < > 1.01* 1.11* 1.05* 1.11* 1.17*  CALCIUM 8.9 9.0 8.6*   < > 8.5* 8.4* 8.6* 8.8* 9.1  PHOS 3.3 4.0 4.4  --   --   --  3.9  --   --    < > = values in this interval not displayed.    CBG: Recent Labs  Lab 03/18/22 1131 03/18/22 1609 03/19/22 0616 03/19/22 0742 03/19/22 1202  GLUCAP 119* 161* 155* 154* 150*    Recent Results (from the past 240 hour(s))  MRSA Next Gen by PCR, Nasal     Status: None   Collection Time: 03/10/22  6:37 PM   Specimen: Nasal Mucosa; Nasal Swab  Result Value Ref Range Status   MRSA by PCR Next Gen NOT DETECTED NOT DETECTED Final    Comment: (NOTE) The GeneXpert MRSA Assay (FDA approved for NASAL specimens only), is one component of a comprehensive  MRSA colonization surveillance program. It is not intended to diagnose MRSA infection nor to guide or monitor treatment for MRSA infections. Test performance is not FDA approved in patients less than 53 years old. Performed at [facility], [address], [place], [place] [address]      Radiology Studies: No results found.  Scheduled Meds:  aspirin EC  81 mg Oral Daily   enoxaparin (LOVENOX) injection  30 mg Subcutaneous Q24H   famotidine  20 mg Oral Daily   feeding supplement (GLUCERNA SHAKE)  237 mL Oral TID BM   furosemide  40 mg Oral BID   insulin aspart  0-9 Units Subcutaneous TID WC   levothyroxine  100 mcg Oral Q0600   melatonin  6 mg Oral QHS   metoprolol succinate  12.5 mg Oral Daily   sertraline  50 mg Oral Daily   spironolactone  12.5 mg Oral Daily   Continuous Infusions:   LOS: 9 days   Time spent: 35 mins  Shatonya Passon Laural Benes, MD How to contact the [facility] Attending or Consulting provider 7A - 7P or covering provider during after hours 7P -7A, for this patient?  Check the care team in [facility] and look for a) attending/consulting TRH provider listed and b) the [facility] team listed Log into [email or web address] and use [place]'s universal password to access. If you do not have the password, please contact the hospital operator. Locate the [facility] provider you are looking for under [facility] and page to a number that you can be directly reached. If you still have difficulty reaching the provider, please page the [facility] (Director on Call) for the Hospitalists listed on amion for assistance.  03/19/2022, 4:45 PM

## 2022-03-19 NOTE — Progress Notes (Addendum)
Subjective:  UOP at least 700-   weight stable -  sodium stable at 134-  did not sleep well-  daughter distressed about pts antidepressant -  also upset that patient is still 10 pounds over her norm  Objective Vital signs in last 24 hours: Vitals:   03/18/22 1932 03/18/22 2126 03/19/22 0500 03/19/22 0525  BP: 124/80 115/84 (!) 124/91   Pulse: 86 92 (!) 112   Resp: 18 14 16    Temp: 97.9 F (36.6 C) 98.1 F (36.7 C) 97.8 F (36.6 C)   TempSrc: Oral Oral Oral   SpO2: 96% 91% 91%   Weight:    62 kg  Height:       Weight change: 0.764 kg  Intake/Output Summary (Last 24 hours) at 03/19/2022 0920 Last data filed at 03/19/2022 [id number] Gross per 24 hour  Intake 100 ml  Output 1150 ml  Net -1050 ml    Assessment/Plan: 88 year old WF with history of biventricular CHF presented with hyponatremia felt to be dry-  initially given IVF, sodium up then down-  urine osm c/w SIADH. Given samsca but without major change in sodium -   given IVF and not fluid restricted since admit so then was volume overloaded   1.  Hyponatremia-  complex case.  Initially she may have been dry on home lasix and that was hypovolemic hyponatremia- also some SIADH.  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 Had a huge improvement with big diuresis from Monday to Tuesday( also might have been delayed effect from samsca)  a little rebound to be expected with no diuretic yesterday  -  resumed lasix 11/22 40 BID which was her home dose-  had a great response-  sodium up again quite abit today -  will dec to 20 BID  Also of note her TSH was a little on the high side so I increased her synthroid from 88 to 100 mcg daily.   The last issue is that she was  on zoloft as an OP -  it was stopped appropriately as it can cause SIADH-  daughter would like something in its place -  has been started on busprone.  Now that sodium is better and with the knowledge that patient had been on zoloft for years-  I have spoken with primary-  maybe we can put her back on it and watch the sodium    2. HTN/volume-  responded really well to IV lasix, also maybe some delayed impact of the samsca-  held 11/21 due to soft BPs -  resume PO lasix at home dose 40 BID 11/22 -  BP is good right now -  I  decreased to 20 BID given her incredible response to 40 BID-  had reasonable response but daughter still thinks with fluid on so will increase back to 40 BID  Patient will not be physically seen over the weekend-  sodium is great-  she is now on her home dose of lasix 40 BID-  I do have concerns that that is too high of a dose of her however, feel free to titrate to need    [place]: Basic  Metabolic Panel: Recent Labs  Lab 03/13/22 1548 03/14/22 0430 03/14/22 1839 03/17/22 0348 03/18/22 0441 03/19/22 0438  NA 120* 123*   < > 129* 134* 134*  K 4.9 4.6   < > 4.1 4.0 4.1  CL 85* 88*   < > 93* 97* 94*  CO2 25 26   < > 27 27 28   GLUCOSE 173* 135*   < > 140* 118* 132*  BUN 20 23   < > 27* 27* 31*  CREATININE 0.88 0.84   < > 1.05* 1.11* 1.17*  CALCIUM 9.0 8.6*   < > 8.6* 8.8* 9.1  PHOS 4.0 4.4  --  3.9  --   --    < > = values in this interval not displayed.   Liver Function Tests: Recent Labs  Lab 03/14/22 1839 03/15/22 0512 03/16/22 1224 03/17/22 0348  AST 101* 90* 62*  --   ALT 103* 101* 83*  --   ALKPHOS 145* 129* 114  --   BILITOT 0.7 0.9 0.7  --   PROT 6.3* 5.9* 5.8*  --   ALBUMIN 3.6 3.3* 3.3* 3.5   No results for input(s): "LIPASE", "AMYLASE" in the last 168 hours.  No results for input(s): "AMMONIA" in the last 168 hours. CBC: Recent Labs  Lab 03/13/22 0355 03/14/22 0430 03/16/22 0431  WBC 8.0 7.9 7.6  HGB 12.4 11.9*  12.0  HCT 36.4 34.3* 34.9*  MCV 94.3 93.7 94.3  PLT 253 242 237   Cardiac Enzymes: No results for input(s): "CKTOTAL", "CKMB", "CKMBINDEX", "TROPONINI" in the last 168 hours. CBG: Recent Labs  Lab 03/18/22 0744 03/18/22 1131 03/18/22 1609 03/19/22 0616 03/19/22 0742  GLUCAP 151* 119* 161* 155* 154*    Iron Studies: No results for input(s): "IRON", "TIBC", "TRANSFERRIN", "FERRITIN" in the last 72 hours. Studies/Results: No results found. Medications: Infusions:   Scheduled Medications:  aspirin EC  81 mg Oral Daily   busPIRone  7.5 mg Oral BID   enoxaparin (LOVENOX) injection  30 mg Subcutaneous Q24H   famotidine  20 mg Oral Daily   feeding supplement (GLUCERNA SHAKE)  237 mL Oral TID BM   furosemide  20 mg Oral BID   insulin aspart  0-9 Units Subcutaneous TID WC   levothyroxine  100 mcg Oral Q0600   melatonin  6 mg Oral QHS   metoprolol succinate  12.5 mg Oral Daily   spironolactone  12.5 mg Oral Daily    have reviewed scheduled and prn medications.  Physical Exam: General: remains bright and interactive  Heart: RRR Lungs: mostly clear Abdomen: soft, non tender Extremities: min edema now    03/19/2022,9:20 AM  LOS: 9 days

## 2022-03-19 NOTE — Progress Notes (Signed)
Patient restless throughout the night; noted to take short naps, no longer than 10 minutes in interval. Daughter at the bedside requested additional medication for sleep/rest. Physician notified and order for Melatonin received. Information shared with daughter related to the order ( Melatonin) and printed education related to the drug provided to the family. Daughter verbalizes that she intends to speak with physician regarding medication adjustments, specifically Buspar.

## 2022-03-19 NOTE — Progress Notes (Signed)
Notified Dr. Laural Benes that patient daughter is declining her mother having any meds until she speaks with him. Multiple issues expressed with medication regime. Patient repositioned in bed.

## 2022-03-19 NOTE — Plan of Care (Signed)
  Problem: Education: Goal: Knowledge of General Education information will improve Description Including pain rating scale, medication(s)/side effects and non-pharmacologic comfort measures Outcome: Progressing   Problem: Health Behavior/Discharge Planning: Goal: Ability to manage health-related needs will improve Outcome: Progressing   

## 2022-03-19 NOTE — NC FL2 (Signed)
[place] MEDICAID FL2 LEVEL OF CARE SCREENING TOOL     IDENTIFICATION  Patient Name: Erin Herman Birthdate: 09/14/1927 Sex: female Admission Date (Current Location): 03/10/2022  [facility] and [place] Number:  [employer] and Address:  [facility],  [address], [place] [address]      Provider Number: [id number]  Attending Physician Name and Address:  Cleora Fleet, MD  Relative Name and Phone Number:       Current Level of Care: Hospital Recommended Level of Care: Skilled Nursing Facility Prior Approval Number:    Date Approved/Denied:   PASRR Number:    Discharge Plan: SNF    Current Diagnoses: Patient Active Problem List   Diagnosis Date Noted   Hyponatremia 03/10/2022   Prolonged QT interval 03/10/2022   Combined systolic and diastolic congestive heart failure (HCC) 03/10/2022   Hypothyroidism 11/23/2021   Leukocytosis 11/23/2021   Depression 07/29/2021   Hypokalemia 07/29/2021   GERD (gastroesophageal reflux disease) 12/29/2020   Chest pain 11/06/2020   Chronic systolic (congestive) heart failure (HCC)    Status post left hip replacement 04/29/2020   NSTEMI (non-ST elevated myocardial infarction) (HCC) 11/04/2018   Dysphagia 05/10/2018   Diabetes (HCC) 02/27/2018   S/P right THA, AA 03/01/2017   Postoperative anemia due to acute blood loss 05/22/2014   DJD (degenerative joint disease) of knee 05/20/2014   Primary localized osteoarthritis of left knee 04/30/2014   Preoperative cardiovascular examination 04/04/2014   Chronic diastolic heart failure (HCC) 07/03/2012   CAROTID ARTERY DISEASE 12/09/2009   Essential hypertension, benign 08/05/2008   CAD, NATIVE VESSEL 08/05/2008   Secondary cardiomyopathy (HCC) 08/05/2008   Aortic valve disorder 08/05/2008   Hyperlipidemia 08/03/2008    Orientation RESPIRATION BLADDER Height & Weight     Self, Time, Situation, Place  Normal Continent Weight: 136 lb 11 oz (62  kg) Height:  5\' 4"  (162.6 cm)  BEHAVIORAL SYMPTOMS/MOOD NEUROLOGICAL BOWEL NUTRITION STATUS      Continent Diet (see dc summary)  AMBULATORY STATUS COMMUNICATION OF NEEDS Skin   Extensive Assist Verbally Normal                       Personal Care Assistance Level of Assistance  Bathing, Feeding, Dressing Bathing Assistance: Limited assistance Feeding assistance: Independent Dressing Assistance: Limited assistance     Functional Limitations Info  Sight, Hearing, Speech Sight Info: Adequate Hearing Info: Adequate Speech Info: Adequate    SPECIAL CARE FACTORS FREQUENCY  PT (By licensed PT), OT (By licensed OT)     PT Frequency: 5x week OT Frequency: 3x week            Contractures Contractures Info: Not present    Additional Factors Info  Code Status, Allergies, Psychotropic Code Status Info: Full Allergies Info: Lipitor, Morphine, Nitroglycerin, Penicillins, Reclast Psychotropic Info: Zoloft         Current Medications (03/19/2022):  This is the current hospital active medication list Current Facility-Administered Medications  Medication Dose Route Frequency Provider Last Rate Last Admin   acetaminophen (TYLENOL) tablet 650 mg  650 mg Oral Q6H PRN Emokpae, Ejiroghene E, MD   650 mg at 03/19/22 0557   Or   acetaminophen (TYLENOL) suppository 650 mg  650 mg Rectal Q6H PRN Emokpae, Ejiroghene E, MD       ALPRAZolam 03/21/22) tablet 0.25 mg  0.25 mg Oral BID PRN Prudy Feeler, Courage, MD   0.25 mg at 03/18/22 2331   alum & mag hydroxide-simeth (MAALOX/MYLANTA)  200-200-20 MG/5ML suspension 30 mL  30 mL Oral Q6H PRN Johnson, Clanford L, MD   30 mL at 03/18/22 1942   aspirin EC tablet 81 mg  81 mg Oral Daily Emokpae, Ejiroghene E, MD   81 mg at 03/19/22 0944   enoxaparin (LOVENOX) injection 30 mg  30 mg Subcutaneous Q24H Emokpae, Courage, MD   30 mg at 03/18/22 1944   famotidine (PEPCID) tablet 20 mg  20 mg Oral Daily Emokpae, Courage, MD   20 mg at 03/19/22 0944   feeding  supplement (GLUCERNA SHAKE) (GLUCERNA SHAKE) liquid 237 mL  237 mL Oral TID BM Emokpae, Courage, MD   237 mL at 03/19/22 0945   furosemide (LASIX) tablet 40 mg  40 mg Oral BID Annie Sable, MD       insulin aspart (novoLOG) injection 0-9 Units  0-9 Units Subcutaneous TID WC Emokpae, Ejiroghene E, MD   2 Units at 03/19/22 0945   levalbuterol (XOPENEX) nebulizer solution 0.63 mg  0.63 mg Nebulization Q6H PRN Adefeso, Oladapo, DO   0.63 mg at 03/17/22 0920   levothyroxine (SYNTHROID) tablet 100 mcg  100 mcg Oral Q0600 Annie Sable, MD   100 mcg at 03/19/22 0521   melatonin tablet 6 mg  6 mg Oral QHS Zierle-Ghosh, Asia B, DO       metoprolol succinate (TOPROL-XL) 24 hr tablet 12.5 mg  12.5 mg Oral Daily Annie Sable, MD   12.5 mg at 03/19/22 0945   Oral care mouth rinse  15 mL Mouth Rinse PRN Zierle-Ghosh, Asia B, DO       polyethylene glycol (MIRALAX / GLYCOLAX) packet 17 g  17 g Oral Daily PRN Emokpae, Ejiroghene E, MD       promethazine (PHENERGAN) tablet 12.5 mg  12.5 mg Oral Q8H PRN Emokpae, Ejiroghene E, MD   12.5 mg at 03/17/22 0258   sertraline (ZOLOFT) tablet 50 mg  50 mg Oral Daily Johnson, Clanford L, MD       spironolactone (ALDACTONE) tablet 12.5 mg  12.5 mg Oral Daily Annie Sable, MD   12.5 mg at 03/19/22 6314     Discharge Medications: Please see discharge summary for a list of discharge medications.  Relevant Imaging Results:  Relevant Lab Results:   Additional Information SSN: [id number] [place]

## 2022-03-19 NOTE — TOC Progression Note (Signed)
Transition of Care [facility]) - Progression Note    Patient Details  Name: Danisha Brassfield MRN: [id number] Date of Birth: 09-10-1927  [facility] CM/SW Contact  Elliot Gault, LCSW Phone Number: 03/19/2022, 10:39 AM  Clinical Narrative:     TOC following. Updated by MD that pt/family now interested in SNF rehab at dc. Spoke with pt's daughter who states that her main concern is for pt's Zoloft to be re-started and for the additional ten pounds of fluid pt has gained to be removed. Daughter states she discussed above with nephrologist this AM.   Daughter confirms that they are interested in seeing which SNF rehab options would be available to pt. CMS provider options reviewed and will refer as requested. Pt will need updated PT recommendations and insurance authorization for SNF rehab.  TOC updated Attending MD. Will follow.    Expected Discharge Plan: Skilled Nursing Facility Barriers to Discharge: Continued Medical Work up  Expected Discharge Plan and Services Expected Discharge Plan: Skilled Nursing Facility In-house Referral: Clinical Social Work   Post Acute Care Choice: Skilled Nursing Facility Living arrangements for the past 2 months: Single Family Home                           HH Arranged: RN, PT [facility] Agency: [facility] Date [facility] Agency Contacted: 03/12/22   Representative spoke with at [facility] Agency: Kandee Keen   Social Determinants of Health (SDOH) Interventions    Readmission Risk Interventions     No data to display

## 2022-03-19 NOTE — Care Management Important Message (Signed)
Important Message  Patient Details  Name: Erin Herman MRN: [id number] Date of Birth: 1927-05-06   Medicare Important Message Given:  Yes     Corey Harold 03/19/2022, 2:07 PM

## 2022-03-19 NOTE — Progress Notes (Signed)
Patient requested to get back to bed shortly after physical therapy assisted her to the chair. Nursing put patient back to bed. Propped to her right side to alternate pressure on her sacrum. Family at bedside.

## 2022-03-19 NOTE — Progress Notes (Addendum)
Physical Therapy Treatment Patient Details Name: Erin Herman MRN: [id number] DOB: August 20, 1927 Today's Date: 03/19/2022   History of Present Illness Erin Herman is a 88 y.o. female with medical history significant for systolic and diastolic CHF, CABG, left bundle branch block, diabetes mellitus, hypertension. Patient presented to the ED with complaints of nausea ongoing for about 5 days now. 1 episode of vomiting at onset of nausea. Over the past 5 days, reports mild confusion over the past 5 days, that patient is taking longer to respond to questions and has been having some gait abnormality.    PT Comments    Patient demonstrates slightly labored movement for sitting up at bedside requiring increased time, fair/good return for completing BLE ROM/strengthening exercises with verbal cueing and increased endurance/distance for gait training.  Patient demonstrated slow labored cadence requiring increased time with flexed trunk, limited mostly due to fatigue and tolerated sitting up in chair after therapy with her granddaughter present in room - RN aware.  Patient will benefit from continued skilled physical therapy in hospital and recommended venue below to increase strength, balance, endurance for safe ADLs and gait.     Recommendations for follow up therapy are one component of a multi-disciplinary discharge planning process, led by the attending physician.  Recommendations may be updated based on patient status, additional functional criteria and insurance authorization.  Follow Up Recommendations  Skilled nursing-short term rehab (<3 hours/day) Can patient physically be transported by private vehicle: Yes   Assistance Recommended at Discharge Set up Supervision/Assistance  Patient can return home with the following A little help with walking and/or transfers;A little help with bathing/dressing/bathroom;Assistance with cooking/housework;Help with stairs or ramp for entrance    Equipment Recommendations  None recommended by PT    Recommendations for Other Services       Precautions / Restrictions Precautions Precautions: Fall Restrictions Weight Bearing Restrictions: No     Mobility  Bed Mobility Overal bed mobility: Needs Assistance Bed Mobility: Supine to Sit     Supine to sit: Min guard     General bed mobility comments: increased time, labored movement    Transfers Overall transfer level: Needs assistance Equipment used: Rolling walker (2 wheels) Transfers: Sit to/from Stand, Bed to chair/wheelchair/BSC Sit to Stand: Min assist   Step pivot transfers: Min assist       General transfer comment: unsteady labored movement    Ambulation/Gait Ambulation/Gait assistance: Min guard, Min assist Gait Distance (Feet): 30 Feet Assistive device: Rolling walker (2 wheels) Gait Pattern/deviations: Decreased step length - right, Decreased step length - left, WFL(Within Functional Limits), Trunk flexed Gait velocity: slow     General Gait Details: Patient demonstrates increased endurance/distance for gait training with slow labored unsteady cadence without loss of balance and limited mostly due to c/o fatigue   Stairs             Wheelchair Mobility    Modified Rankin (Stroke Patients Only)       Balance Overall balance assessment: Needs assistance Sitting-balance support: Feet supported, No upper extremity supported Sitting balance-Leahy Scale: Good Sitting balance - Comments: seated EOB   Standing balance support: Bilateral upper extremity supported, During functional activity, Reliant on assistive device for balance Standing balance-Leahy Scale: Fair Standing balance comment: fair/good with RW                            Cognition Arousal/Alertness: Awake/alert Behavior During Therapy: WFL for tasks assessed/performed Overall Cognitive Status:  Within Functional Limits for tasks assessed                                           Exercises General Exercises - Lower Extremity Long Arc Quad: Seated, AROM, Strengthening, Both, 10 reps Hip Flexion/Marching: Seated, AROM, Strengthening, Both, 10 reps Toe Raises: Seated, AROM, Strengthening, Both, 10 reps Heel Raises: Seated, AROM, Strengthening, Both, 10 reps    General Comments        Pertinent Vitals/Pain Pain Assessment Pain Assessment: No/denies pain    Home Living                          Prior Function            PT Goals (current goals can now be found in the care plan section) Acute Rehab PT Goals Patient Stated Goal: return home after reahb PT Goal Formulation: With patient/family Time For Goal Achievement: 04/02/22 Potential to Achieve Goals: Good Progress towards PT goals: Progressing toward goals    Frequency    Min 3X/week      PT Plan Discharge plan needs to be updated    Co-evaluation              AM-PAC PT "6 Clicks" Mobility   Outcome Measure  Help needed turning from your back to your side while in a flat bed without using bedrails?: None Help needed moving from lying on your back to sitting on the side of a flat bed without using bedrails?: A Little Help needed moving to and from a bed to a chair (including a wheelchair)?: A Little Help needed standing up from a chair using your arms (e.g., wheelchair or bedside chair)?: A Little Help needed to walk in hospital room?: A Little Help needed climbing 3-5 steps with a railing? : A Lot 6 Click Score: 18    End of Session   Activity Tolerance: Patient tolerated treatment well;Patient limited by fatigue Patient left: in chair;with call bell/phone within reach;with family/visitor present Nurse Communication: Mobility status PT Visit Diagnosis: Unsteadiness on feet (R26.81);Other abnormalities of gait and mobility (R26.89);Muscle weakness (generalized) (M62.81)     Time: 6468-0321 PT Time Calculation (min) (ACUTE ONLY):  24 min  Charges:  $Gait Training: 8-22 mins $Therapeutic Exercise: 8-22 mins                     1:48 PM, 03/19/22 Ocie Bob, MPT Physical Therapist with [facility] 336 [phone] office [phone] mobile phone

## 2022-03-20 ENCOUNTER — Encounter (HOSPITAL_COMMUNITY): Payer: Self-pay

## 2022-03-20 DIAGNOSIS — R9431 Abnormal electrocardiogram [ECG] [EKG]: Secondary | ICD-10-CM | POA: Diagnosis not present

## 2022-03-20 DIAGNOSIS — E871 Hypo-osmolality and hyponatremia: Secondary | ICD-10-CM | POA: Diagnosis not present

## 2022-03-20 DIAGNOSIS — E876 Hypokalemia: Secondary | ICD-10-CM | POA: Diagnosis not present

## 2022-03-20 LAB — GLUCOSE, CAPILLARY
Glucose-Capillary: 143 mg/dL — ABNORMAL HIGH (ref 70–99)
Glucose-Capillary: 150 mg/dL — ABNORMAL HIGH (ref 70–99)
Glucose-Capillary: 166 mg/dL — ABNORMAL HIGH (ref 70–99)
Glucose-Capillary: 151 mg/dL — ABNORMAL HIGH (ref 70–99)

## 2022-03-20 LAB — BASIC METABOLIC PANEL
Anion gap: 16 — ABNORMAL HIGH (ref 5–15)
BUN: 43 mg/dL — ABNORMAL HIGH (ref 8–23)
CO2: 23 mmol/L (ref 22–32)
Calcium: 9.3 mg/dL (ref 8.9–10.3)
Chloride: 90 mmol/L — ABNORMAL LOW (ref 98–111)
Creatinine, Ser: 1.36 mg/dL — ABNORMAL HIGH (ref 0.44–1.00)
GFR, Estimated: 37 mL/min — ABNORMAL LOW (ref >60)
Glucose, Bld: 145 mg/dL — ABNORMAL HIGH (ref 70–99)
Potassium: 4.9 mmol/L (ref 3.5–5.1)
Sodium: 129 mmol/L — ABNORMAL LOW (ref 135–145)

## 2022-03-20 MED ORDER — MELATONIN 3 MG PO TABS
9.0000 mg | ORAL_TABLET | Freq: Every day | ORAL | Status: DC
  Administered 2022-03-20 – 2022-03-25 (×5): 9 mg via ORAL
  Filled 2022-03-20 (×6): qty 3

## 2022-03-20 NOTE — Progress Notes (Signed)
PROGRESS NOTE   Erin Herman  GMW:[id number] DOB: 06-08-1927 DOA: 03/10/2022 PCP: Erin Peri, MD   Chief Complaint  Patient presents with   Nausea   Level of care: Med-Surg  Brief Admission History:  88 y.o. female with medical history significant for systolic and diastolic CHF, CABG, left bundle branch block, diabetes mellitus, hypertension. Patient presented to the ED with complaints of nausea ongoing for about 5 days now.  1 episode of vomiting at onset of nausea.  Over the past 5 days, reports mild confusion over the past 5 days, that patient is taking longer to respond to questions and has been having some gait abnormality.  No headaches.  No loss of consciousness or seizures.  At baseline has no significant memory problems. No diarrhea, no abdominal pain. Daughter reports patient drinks a good amount of water, but cannot quantify exactly.  She takes Lasix 40 mg twice daily.  She has chronic poor oral intake. Patient intermittently has difficulty voiding, but she denies any pain with urination or other urinary symptoms.  She was diagnosed with UTI 2 days ago and is completing a 3 day course of ciprofloxacin, last dose tonight.   ED Course: Stable vitals.  Sodium 119.  Potassium 2.2.  Magnesium 1.6.  UA with positive leukocytes.  CT abdomen and pelvis shows markedly distended urinary bladder, without other acute abnormality. 1.5 L bolus given.  Hospitalist admit for hyponatremia and hypokalemia   Assessment and Plan:  acute symptomatic hyponatremia Sodium 119 on admission.   3 months ago sodium was 139.  Baseline ~mid 130s.  -Initially appeared dehydrated and volume depleted on admission due to poor oral intake and spironolactone and Lasix use -Prior to admission patient had occasional episodes of nausea vomiting and diarrhea but not enough to explain degree of electrolyte derangement by just GI losses -Mentation already improved back to baseline as per patient's daughter  Erin Herman who is at bedside 03/16/22 -Repeat serum osm (was 257 on 03/10/2022, 265 on 03/14/22) -Repeat urine sodium osmolarity (was 144 on 03/10/2022, 426 on 03/13/22 ) TSH was 4.5 Uric Acid random and urine is 25 -Random sodium urine < 10 (it was 11 on 03/10/2022) -a.m. cortisol 14.6 -I requested official nephrology consultation from Dr. Kathrene Bongo--- please see full consult note dated 03/15/2022 Received Tolvaptan 15 mg x 1 on 03/14/22 -Received IV Lasix and Aldactone on 03/15/2022--sodium levels improved significantly but developed hypotension required IV fluid bolus for persistent hypotension -Per nephrologist no further Lasix on 03/16/2022 -nephrology restarted home oral Lasix on 03/17/2022 with very good urine output -Na improved to 134; lasix reduced to 20 mg BID 11/23, daughter requested increase back to home dose 40 mg BID on 11/24  and tody Na back down to 129.  Daughter now requesting transfer to [facility] as nephrology will not see patient at [facility] this weekend.  Awaiting transfer to [facility]. I reached out to Dr. Marisue Humble and he agreed to have nephrology team follow at [facility] when transferred.    acute on chronic combined systolic and diastolic congestive heart failure - Last echo 10/2021, EF of 35%, with grade 3 DD- restrictive. -03/16/22 -Wt gain and Volume overload concerns   -IV Lasix and Aldactone as above #1 -Daily weights and fluid input and output monitoring   Intake/Output Summary (Last 24 hours) at 03/20/2022 1226 Last data filed at 03/20/2022 0900 Gross per 24 hour  Intake 360 ml  Output 200 ml  Net 160 ml   Filed Weights   03/18/22 0642 03/19/22 0525  03/20/22 0610  Weight: 61.2 kg 62 kg 62.7 kg   chronically prolonged QT interval Prolonged at 531, chronic.  -Replaced electrolytes   Hyperkalemia/hypomagnesemia - Treated and resolved  -Normalized with Rx -Lasix and Aldactone as above #1   5)Controlled, type 2 Diabetes Mellitus  -A1c 6.6 reflecting good diabetic control  PTA -Use Novolog/Humalog Sliding scale insulin with Accu-Cheks/Fingersticks as ordered  -Continue to hold metformin while inpatient   CBG (last 3)  Recent Labs    03/19/22 2055 03/20/22 0750 03/20/22 1122  GLUCAP 137* 143* 150*   6)CAD, NATIVE VESSEL History of CABG 2006 and subsequent stent placement 2020.   -Chest pain-free, continue aspirin and metoprolol -Hold isosorbide due to soft BP   7)Essential hypertension, benign Stable. -Stopped Imdur due to soft BP,  -Continue metoprolol due to tachycardia   8) acute on chronic urinary retention--patient has chronic urinary retention, usually sees urogynecologist at [facility] -Voiding difficulties improving with Flomax -in and out catheterization as needed   9)Social/Ethics--- plan of care and directives discussed with Erin Herman (daughter Ms Erin Herman) and granddaughter Erin Herman- -family will like to have further conversations amongst themselves prior to making a final decision -Full code for now -Overall prognosis is poor given advanced age, low EF and other comorbid conditions   10) anxiety disorder --- Zoloft was stopped due to hyponatremia; discussed with nephrology resuming home dose 50 mg daily; monitor sodium.   -May use Xanax as needed    11)Transaminitis--suspect this may be related to hepatic congestion in the setting of acute on chronic combined CHF exacerbation  LFTs trending down with improvement in hepatic congestion/CHF   12) generalized weakness/deconditioning---  PT eval completed 11/24, now rec SNF   DVT prophylaxis: SCDs, enoxaparin Code Status: Full  Family Communication: daughter at bedside updated Disposition: TOC working on SNF placement transfer to [facility] per daughter request Remains inpatient appropriate because: intensity   Consultants:   Procedures:   Antimicrobials:    Subjective: Pt reports nausea this morning. Daughter wouldn't allow morning meds to be given.  Daughter upset  patient not sleeping well.  Daughter wants to give patient Gatorade or pedialyte.   Objective: Vitals:   03/19/22 0525 03/19/22 1203 03/19/22 2056 03/20/22 0610  BP:  108/78 (!) 115/94 116/84  Pulse:  (!) 110 (!) 110 (!) 108  Resp:  20 18 18   Temp:  (!) 97.4 F (36.3 C) (!) 97.5 F (36.4 C) 97.9 F (36.6 C)  TempSrc:  Oral Oral   SpO2:  98% 95% 94%  Weight: 62 kg   62.7 kg  Height:        Intake/Output Summary (Last 24 hours) at 03/20/2022 1221 Last data filed at 03/20/2022 0900 Gross per 24 hour  Intake 360 ml  Output 200 ml  Net 160 ml   Filed Weights   03/18/22 0642 03/19/22 0525 03/20/22 0610  Weight: 61.2 kg 62 kg 62.7 kg   Examination:  General exam: frail elderly female, lying in bed; NAD.  Respiratory system: Clear to auscultation. Respiratory effort normal. Cardiovascular system: normal S1 & S2 heard. No JVD, murmurs, rubs, gallops or clicks. No pedal edema. Gastrointestinal system: Abdomen is nondistended, soft and nontender. No organomegaly or masses felt. Normal bowel sounds heard. Central nervous system: Alert and oriented. No focal neurological deficits. Extremities: Symmetric 5 x 5 power. Skin: No rashes, lesions or ulcers. Psychiatry: Judgement and insight appear normal. Mood & affect appropriate.   Data Reviewed: I have personally reviewed following labs  and imaging studies  CBC: Recent Labs  Lab 03/14/22 0430 03/16/22 0431  WBC 7.9 7.6  HGB 11.9* 12.0  HCT 34.3* 34.9*  MCV 93.7 94.3  PLT 242 237    Basic Metabolic Panel: Recent Labs  Lab 03/13/22 1548 03/14/22 0430 03/14/22 1839 03/16/22 1601 03/17/22 0348 03/18/22 0441 03/19/22 0438 03/20/22 0505  NA 120* 123*   < > 131* 129* 134* 134* 129*  K 4.9 4.6   < > 4.2 4.1 4.0 4.1 4.9  CL 85* 88*   < > 92* 93* 97* 94* 90*  CO2 25 26   < > 29 27 27 28 23   GLUCOSE 173* 135*   < > 139* 140* 118* 132* 145*  BUN 20 23   < > 31* 27* 27* 31* 43*  CREATININE 0.88 0.84   < > 1.11* 1.05* 1.11*  1.17* 1.36*  CALCIUM 9.0 8.6*   < > 8.4* 8.6* 8.8* 9.1 9.3  PHOS 4.0 4.4  --   --  3.9  --   --   --    < > = values in this interval not displayed.    CBG: Recent Labs  Lab 03/19/22 1202 03/19/22 1659 03/19/22 2055 03/20/22 0750 03/20/22 1122  GLUCAP 150* 122* 137* 143* 150*    Recent Results (from the past 240 hour(s))  MRSA Next Gen by PCR, Nasal     Status: None   Collection Time: 03/10/22  6:37 PM   Specimen: Nasal Mucosa; Nasal Swab  Result Value Ref Range Status   MRSA by PCR Next Gen NOT DETECTED NOT DETECTED Final    Comment: (NOTE) The GeneXpert MRSA Assay (FDA approved for NASAL specimens only), is one component of a comprehensive MRSA colonization surveillance program. It is not intended to diagnose MRSA infection nor to guide or monitor treatment for MRSA infections. Test performance is not FDA approved in patients less than 81 years old. Performed at [facility], [address], [place], [place]      Radiology Studies: No results found.  Scheduled Meds:  aspirin EC  81 mg Oral Daily   enoxaparin (LOVENOX) injection  30 mg Subcutaneous Q24H   famotidine  20 mg Oral Daily   feeding supplement (GLUCERNA SHAKE)  237 mL Oral TID BM   furosemide  40 mg Oral BID   insulin aspart  0-9 Units Subcutaneous TID WC   levothyroxine  100 mcg Oral Q0600   melatonin  9 mg Oral QHS   metoprolol succinate  12.5 mg Oral Daily   sertraline  50 mg Oral Daily   spironolactone  12.5 mg Oral Daily   Continuous Infusions:   LOS: 10 days   Time spent: 35 mins  Dameisha Tschida [id number], MD How to contact the [facility] Attending or Consulting provider 7A - 7P or covering provider during after hours 7P -7A, for this patient?  Check the care team in [facility] and look for a) attending/consulting TRH provider listed and b) the [facility] team listed Log into [email or web address] and use [place]'s universal password to access. If you do not have the password, please contact the hospital  operator. Locate the [facility] provider you are looking for under [facility] and page to a number that you can be directly reached. If you still have difficulty reaching the provider, please page the [facility] (Director on Call) for the Hospitalists listed on amion for assistance.  03/20/2022, 12:21 PM

## 2022-03-20 NOTE — Progress Notes (Addendum)
11:05am: CSW received call from patient's daughter [name] who states she is interested in possibly having the patient transferred to either [facility] or [facility] due to the patient not making any improvements.  CSW notified Dr. Laural Benes of information.  10:30am: Patient does not have any SNF bed offers at this time.  Edwin Dada, MSW, LCSW Transitions of Care  Clinical Social Worker II [phone]

## 2022-03-20 NOTE — Progress Notes (Signed)
Patient is still awake following melatonin. Per patient's daughter she has not been sleeping much at all, just a few hours here and there.

## 2022-03-21 DIAGNOSIS — E871 Hypo-osmolality and hyponatremia: Secondary | ICD-10-CM | POA: Diagnosis not present

## 2022-03-21 DIAGNOSIS — E876 Hypokalemia: Secondary | ICD-10-CM | POA: Diagnosis not present

## 2022-03-21 DIAGNOSIS — I10 Essential (primary) hypertension: Secondary | ICD-10-CM | POA: Diagnosis not present

## 2022-03-21 DIAGNOSIS — I5040 Unspecified combined systolic (congestive) and diastolic (congestive) heart failure: Secondary | ICD-10-CM | POA: Diagnosis not present

## 2022-03-21 LAB — GLUCOSE, CAPILLARY
Glucose-Capillary: 121 mg/dL — ABNORMAL HIGH (ref 70–99)
Glucose-Capillary: 149 mg/dL — ABNORMAL HIGH (ref 70–99)
Glucose-Capillary: 179 mg/dL — ABNORMAL HIGH (ref 70–99)
Glucose-Capillary: 134 mg/dL — ABNORMAL HIGH (ref 70–99)

## 2022-03-21 LAB — BASIC METABOLIC PANEL
Anion gap: 15 (ref 5–15)
BUN: 53 mg/dL — ABNORMAL HIGH (ref 8–23)
CO2: 24 mmol/L (ref 22–32)
Calcium: 8.7 mg/dL — ABNORMAL LOW (ref 8.9–10.3)
Chloride: 89 mmol/L — ABNORMAL LOW (ref 98–111)
Creatinine, Ser: 1.59 mg/dL — ABNORMAL HIGH (ref 0.44–1.00)
GFR, Estimated: 30 mL/min — ABNORMAL LOW (ref >60)
Glucose, Bld: 120 mg/dL — ABNORMAL HIGH (ref 70–99)
Potassium: 4.4 mmol/L (ref 3.5–5.1)
Sodium: 128 mmol/L — ABNORMAL LOW (ref 135–145)

## 2022-03-21 MED ORDER — FENTANYL CITRATE PF 50 MCG/ML IJ SOSY
12.5000 ug | PREFILLED_SYRINGE | INTRAMUSCULAR | Status: DC | PRN
  Administered 2022-03-22 – 2022-03-25 (×4): 12.5 ug via INTRAVENOUS
  Filled 2022-03-21 (×4): qty 1

## 2022-03-21 MED ORDER — OXYCODONE HCL 5 MG PO TABS
2.5000 mg | ORAL_TABLET | ORAL | Status: DC | PRN
  Administered 2022-03-21 – 2022-03-27 (×7): 2.5 mg via ORAL
  Filled 2022-03-21 (×8): qty 1

## 2022-03-21 MED ORDER — LOPERAMIDE HCL 2 MG PO CAPS
2.0000 mg | ORAL_CAPSULE | ORAL | Status: DC | PRN
  Administered 2022-03-21: 2 mg via ORAL
  Filled 2022-03-21: qty 1

## 2022-03-21 MED ORDER — LIDOCAINE 5 % EX PTCH
1.0000 | MEDICATED_PATCH | Freq: Every day | CUTANEOUS | Status: DC | PRN
  Administered 2022-03-21 – 2022-03-27 (×3): 1 via TRANSDERMAL
  Filled 2022-03-21 (×5): qty 1

## 2022-03-21 NOTE — Progress Notes (Signed)
PROGRESS NOTE   Erin Herman  MLJ:[id number] DOB: 11/27/1927 DOA: 03/10/2022 PCP: Kirstie Peri, MD   Chief Complaint  Patient presents with   Nausea   Level of care: Med-Surg  Brief Admission History:  88 y.o. female with medical history significant for systolic and diastolic CHF, CABG, left bundle branch block, diabetes mellitus, hypertension. Patient presented to the ED with complaints of nausea ongoing for about 5 days now.  1 episode of vomiting at onset of nausea.  Over the past 5 days, reports mild confusion over the past 5 days, that patient is taking longer to respond to questions and has been having some gait abnormality.  No headaches.  No loss of consciousness or seizures.  At baseline has no significant memory problems. No diarrhea, no abdominal pain. Daughter reports patient drinks a good amount of water, but cannot quantify exactly.  She takes Lasix 40 mg twice daily.  She has chronic poor oral intake. Patient intermittently has difficulty voiding, but she denies any pain with urination or other urinary symptoms.  She was diagnosed with UTI 2 days ago and is completing a 3 day course of ciprofloxacin, last dose tonight.   ED Course: Stable vitals.  Sodium 119.  Potassium 2.2.  Magnesium 1.6.  UA with positive leukocytes.  CT abdomen and pelvis shows markedly distended urinary bladder, without other acute abnormality. 1.5 L bolus given.  Hospitalist admit for hyponatremia and hypokalemia   Assessment and Plan:  acute symptomatic hyponatremia Sodium 119 on admission.   3 months ago sodium was 139.  Baseline ~mid 130s.  -Initially appeared dehydrated and volume depleted on admission due to poor oral intake and spironolactone and Lasix use -Prior to admission patient had occasional episodes of nausea vomiting and diarrhea but not enough to explain degree of electrolyte derangement by just GI losses -Mentation already improved back to baseline as per patient's daughter  Erin Herman who is at bedside 03/16/22 -Repeat serum osm (was 257 on 03/10/2022, 265 on 03/14/22) -Repeat urine sodium osmolarity (was 144 on 03/10/2022, 426 on 03/13/22 ) TSH was 4.5 Uric Acid random and urine is 25 -Random sodium urine < 10 (it was 11 on 03/10/2022) -a.m. cortisol 14.6 -I requested official nephrology consultation from Dr. Kathrene Bongo--- please see full consult note dated 03/15/2022 Received Tolvaptan 15 mg x 1 on 03/14/22 -Received IV Lasix and Aldactone on 03/15/2022--sodium levels improved significantly but developed hypotension required IV fluid bolus for persistent hypotension -Per nephrologist no further Lasix on 03/16/2022 -nephrology restarted home oral Lasix on 03/17/2022 with very good urine output -Na improved to 134; lasix reduced to 20 mg BID 11/23, daughter requested increase back to home dose 40 mg BID on 11/24  and today Na back down to 128.  Daughter now requesting transfer to [facility] as nephrology will not see patient at [facility] this weekend.  Awaiting transfer to [facility]. I reached out to Dr. Marisue Humble and he agreed to have nephrology team follow at [facility] when transferred.    acute on chronic combined systolic and diastolic congestive heart failure - Last echo 10/2021, EF of 35%, with grade 3 DD- restrictive. -03/16/22 -Wt gain and Volume overload concerns   -IV Lasix and Aldactone as above #1 -Daily weights and fluid input and output monitoring   Intake/Output Summary (Last 24 hours) at 03/21/2022 1224 Last data filed at 03/20/2022 2140 Gross per 24 hour  Intake 480 ml  Output 400 ml  Net 80 ml   Filed Weights   03/19/22 0525 03/20/22 0610  03/21/22 0513  Weight: 62 kg 62.7 kg 63.2 kg   chronically prolonged QT interval Prolonged at 531, chronic.  -Replaced electrolytes   Hyperkalemia/hypomagnesemia - Treated and resolved  -Normalized with Rx -Lasix and Aldactone as above #1   5)Controlled, type 2 Diabetes Mellitus  -A1c 6.6 reflecting good diabetic control  PTA -Use Novolog/Humalog Sliding scale insulin with Accu-Cheks/Fingersticks as ordered  -Continue to hold metformin while inpatient   CBG (last 3)  Recent Labs    03/20/22 2153 03/21/22 0740 03/21/22 1202  GLUCAP 151* 121* 149*   6)CAD, NATIVE VESSEL History of CABG 2006 and subsequent stent placement 2020.   -Chest pain-free, continue aspirin and metoprolol -Hold isosorbide due to soft BP   7)Essential hypertension, benign Stable. -Stopped Imdur due to soft BP  -Continue metoprolol due to tachycardia   8) acute on chronic urinary retention--patient has chronic urinary retention, usually sees urogynecologist at [facility] -Voiding difficulties improving with Flomax -in and out catheterization as needed   9)Social/Ethics--- plan of care and directives discussed with Erin Herman (daughter Ms Erin Herman) and granddaughter Erin Herman- -family will like to have further conversations amongst themselves prior to making a final decision -Full code for now -Overall prognosis is poor given advanced age, low EF and other comorbid conditions   10) anxiety disorder --- Zoloft was stopped on admission due to severe hyponatremia; discussed with nephrology (Dr. Kathrene Bongo) resuming home dose 50 mg daily; monitor sodium.   -May use Xanax as needed    11)Transaminitis--suspect this may be related to hepatic congestion in the setting of acute on chronic combined CHF exacerbation  LFTs trending down with improvement in hepatic congestion/CHF   12) generalized weakness/deconditioning---  PT eval completed 11/24, now rec SNF, TOC working on SNF placement.   DVT prophylaxis: SCDs, enoxaparin Code Status: Full  Family Communication: daughter at bedside updated daily Disposition: TOC working on SNF placement transfer to [facility] per daughter request Remains inpatient appropriate because: intensity   Consultants:   Procedures:   Antimicrobials:    Subjective: Daughter reports pt  slept better overnight and less nausea and was able to eat.     Objective: Vitals:   03/20/22 2119 03/21/22 0507 03/21/22 0513 03/21/22 1137  BP: 105/81 104/75    Pulse: (!) 110 (!) 108    Resp: 18 18    Temp: 97.6 F (36.4 C) 97.7 F (36.5 C)    TempSrc:  Oral    SpO2: 93% 93%  95%  Weight:   63.2 kg   Height:        Intake/Output Summary (Last 24 hours) at 03/21/2022 1224 Last data filed at 03/20/2022 2140 Gross per 24 hour  Intake 480 ml  Output 400 ml  Net 80 ml   Filed Weights   03/19/22 0525 03/20/22 0610 03/21/22 0513  Weight: 62 kg 62.7 kg 63.2 kg   Examination:  General exam: frail elderly female, sleeping in bed; NAD.  Respiratory system: Clear to auscultation. Respiratory effort normal. Cardiovascular system: normal S1 & S2 heard. No JVD, murmurs, rubs, gallops or clicks. No pedal edema. Gastrointestinal system: Abdomen is nondistended, soft and nontender. No organomegaly or masses felt. Normal bowel sounds heard. Central nervous system: Alert and oriented. No focal neurological deficits. Extremities: Symmetric 5 x 5 power. Skin: No rashes, lesions or ulcers. Psychiatry: Judgement and insight appear unable to determine. Mood & affect appropriate.   Data Reviewed: I have personally reviewed following labs and imaging studies  CBC: Recent Labs  Lab 03/16/22 0431  WBC 7.6  HGB 12.0  HCT 34.9*  MCV 94.3  PLT 237    Basic Metabolic Panel: Recent Labs  Lab 03/17/22 0348 03/18/22 0441 03/19/22 0438 03/20/22 0505 03/21/22 0700  NA 129* 134* 134* 129* 128*  K 4.1 4.0 4.1 4.9 4.4  CL 93* 97* 94* 90* 89*  CO2 27 27 28 23 24   GLUCOSE 140* 118* 132* 145* 120*  BUN 27* 27* 31* 43* 53*  CREATININE 1.05* 1.11* 1.17* 1.36* 1.59*  CALCIUM 8.6* 8.8* 9.1 9.3 8.7*  PHOS 3.9  --   --   --   --     CBG: Recent Labs  Lab 03/20/22 1122 03/20/22 1701 03/20/22 2153 03/21/22 0740 03/21/22 1202  GLUCAP 150* 166* 151* 121* 149*    No results found for  this or any previous visit (from the past 240 hour(s)).    Radiology Studies: No results found.  Scheduled Meds:  aspirin EC  81 mg Oral Daily   enoxaparin (LOVENOX) injection  30 mg Subcutaneous Q24H   famotidine  20 mg Oral Daily   feeding supplement (GLUCERNA SHAKE)  237 mL Oral TID BM   furosemide  40 mg Oral BID   insulin aspart  0-9 Units Subcutaneous TID WC   levothyroxine  100 mcg Oral Q0600   melatonin  9 mg Oral QHS   metoprolol succinate  12.5 mg Oral Daily   sertraline  50 mg Oral Daily   spironolactone  12.5 mg Oral Daily   Continuous Infusions:   LOS: 11 days   Time spent: 35 mins  Ambrea Hegler 03/23/22, MD How to contact the [facility] Attending or Consulting provider 7A - 7P or covering provider during after hours 7P -7A, for this patient?  Check the care team in [facility] and look for a) attending/consulting TRH provider listed and b) the [facility] team listed Log into [email or web address] and use [place]'s universal password to access. If you do not have the password, please contact the hospital operator. Locate the [facility] provider you are looking for under [facility] and page to a number that you can be directly reached. If you still have difficulty reaching the provider, please page the [facility] (Director on Call) for the Hospitalists listed on amion for assistance.  03/21/2022, 12:24 PM

## 2022-03-21 NOTE — Progress Notes (Signed)
Pt able to rest this noc with nausea controlled.  No emesis this shift.  Pain managed with prn tylenol.  Family at bedside.  Pt awaiting transfer bed at [facility].

## 2022-03-21 NOTE — Progress Notes (Signed)
Pt has not voided this shift.  Bladder scanned for 200 ml.  Encouraged hydration and continue to monitor at this time.  Provider notified of interventions.

## 2022-03-21 NOTE — Progress Notes (Signed)
Events noted that patient after Friday has developed an AKI and UOP not well recorded but weight not down-  in addition sodium has decreased from 134 to 129, then 128.    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  We had resumed the zoloft on Friday because pt was not doing well mentally and with sodium of 134 we felt we had a cushion.  I kept the zoloft on board as well as the lasix 40 BID.  Whether she moves to [facility] or if she stays at [facility], nephrology will see her tomorrow   Cecille Aver

## 2022-03-22 ENCOUNTER — Ambulatory Visit: Payer: Medicare Other | Admitting: Nurse Practitioner

## 2022-03-22 DIAGNOSIS — R9431 Abnormal electrocardiogram [ECG] [EKG]: Secondary | ICD-10-CM | POA: Diagnosis not present

## 2022-03-22 DIAGNOSIS — E876 Hypokalemia: Secondary | ICD-10-CM | POA: Diagnosis not present

## 2022-03-22 DIAGNOSIS — E871 Hypo-osmolality and hyponatremia: Secondary | ICD-10-CM | POA: Diagnosis not present

## 2022-03-22 LAB — GLUCOSE, CAPILLARY
Glucose-Capillary: 111 mg/dL — ABNORMAL HIGH (ref 70–99)
Glucose-Capillary: 167 mg/dL — ABNORMAL HIGH (ref 70–99)
Glucose-Capillary: 142 mg/dL — ABNORMAL HIGH (ref 70–99)
Glucose-Capillary: 144 mg/dL — ABNORMAL HIGH (ref 70–99)

## 2022-03-22 LAB — BASIC METABOLIC PANEL
Anion gap: 11 (ref 5–15)
BUN: 57 mg/dL — ABNORMAL HIGH (ref 8–23)
CO2: 23 mmol/L (ref 22–32)
Calcium: 8.2 mg/dL — ABNORMAL LOW (ref 8.9–10.3)
Chloride: 89 mmol/L — ABNORMAL LOW (ref 98–111)
Creatinine, Ser: 1.56 mg/dL — ABNORMAL HIGH (ref 0.44–1.00)
GFR, Estimated: 31 mL/min — ABNORMAL LOW (ref >60)
Glucose, Bld: 117 mg/dL — ABNORMAL HIGH (ref 70–99)
Potassium: 4.6 mmol/L (ref 3.5–5.1)
Sodium: 123 mmol/L — ABNORMAL LOW (ref 135–145)

## 2022-03-22 LAB — SODIUM
Sodium: 124 mmol/L — ABNORMAL LOW (ref 135–145)
Sodium: 125 mmol/L — ABNORMAL LOW (ref 135–145)
Sodium: 123 mmol/L — ABNORMAL LOW (ref 135–145)
Sodium: 122 mmol/L — ABNORMAL LOW (ref 135–145)

## 2022-03-22 LAB — OSMOLALITY: Osmolality: 281 mOsm/kg (ref 275–295)

## 2022-03-22 NOTE — Progress Notes (Signed)
Patient received fentanyl at 1800. She has been lethargic since I started my shift. She has cringed her face when I gave her the Lovenox shot but other than that, she has been asleep and not responding verbally to me.  She was not given PO medications because I was afraid of aspiration given how lethargic she is.

## 2022-03-22 NOTE — TOC Progression Note (Signed)
Transition of Care [facility]) - Progression Note    Patient Details  Name: Erin Herman MRN: [id number] Date of Birth: 05-18-1927  Transition of Care [facility]) CM/SW Contact  Elliot Gault, LCSW Phone Number: 03/22/2022, 10:19 AM  Clinical Narrative:     TOC following. Per MD, pt awaiting transfer to [facility] and will require at least three more days of hospital care. TOC will cancel SNF auth request that was started on Friday last week.  Will follow.  Expected Discharge Plan: Skilled Nursing Facility Barriers to Discharge: Continued Medical Work up  Expected Discharge Plan and Services Expected Discharge Plan: Skilled Nursing Facility In-house Referral: Clinical Social Work   Post Acute Care Choice: Skilled Nursing Facility Living arrangements for the past 2 months: Single Family Home                           HH Arranged: RN, PT [facility] Agency: [facility] Date [facility] Agency Contacted: 03/12/22   Representative spoke with at [facility] Agency: Kandee Keen   Social Determinants of Health (SDOH) Interventions    Readmission Risk Interventions     No data to display

## 2022-03-22 NOTE — Progress Notes (Signed)
Pt ambulated to restroom with the assistance of front wheel walker and 2 person assist (one each side). Pt tolerated well. She had a mostly steady gait but took very small steps. Erin Herman

## 2022-03-22 NOTE — Progress Notes (Signed)
PROGRESS NOTE   Erin Herman  GDJ:[id number] DOB: [birth year] DOA: 03/10/2022 PCP: Erin Peri, MD   Chief Complaint  Patient presents with   Nausea   Level of care: Med-Surg  Brief Admission History:  88 y.o. female with medical history significant for systolic and diastolic CHF, CABG, left bundle branch block, diabetes mellitus, hypertension. Patient presented to the ED with complaints of nausea ongoing for about 5 days now.  1 episode of vomiting at onset of nausea.  Over the past 5 days, reports mild confusion over the past 5 days, that patient is taking longer to respond to questions and has been having some gait abnormality.  No headaches.  No loss of consciousness or seizures.  At baseline has no significant memory problems. No diarrhea, no abdominal pain. Daughter reports patient drinks a good amount of water, but cannot quantify exactly.  She takes Lasix 40 mg twice daily.  She has chronic poor oral intake. Patient intermittently has difficulty voiding, but she denies any pain with urination or other urinary symptoms.  She was diagnosed with UTI 2 days ago and is completing a 3 day course of ciprofloxacin, last dose tonight.   ED Course: Stable vitals.  Sodium 119.  Potassium 2.2.  Magnesium 1.6.  UA with positive leukocytes.  CT abdomen and pelvis shows markedly distended urinary bladder, without other acute abnormality. 1.5 L bolus given.  Hospitalist admit for hyponatremia and hypokalemia   Assessment and Plan:  acute symptomatic hyponatremia Sodium 119 on admission.   3 months ago sodium was 139.  Baseline ~mid 130s.  -Initially appeared dehydrated and volume depleted on admission due to poor oral intake and spironolactone and Lasix use -Prior to admission patient had occasional episodes of nausea vomiting and diarrhea but not enough to explain degree of electrolyte derangement by just GI losses -Mentation already improved back to baseline as per patient's daughter  Erin Herman who is at bedside 03/16/22 -Repeat serum osm (was 257 on 03/10/2022, 265 on 03/14/22) -Repeat urine sodium osmolarity (was 144 on 03/10/2022, 426 on 03/13/22 ) TSH was 4.5 Uric Acid random and urine is 25 -Random sodium urine < 10 (it was 11 on 03/10/2022) -a.m. cortisol 14.6 -I requested official nephrology consultation from Dr. Kathrene Bongo--- please see full consult note dated 03/15/2022 Received Tolvaptan 15 mg x 1 on 03/14/22 -Received IV Lasix and Aldactone on 03/15/2022--sodium levels improved significantly but developed hypotension required IV fluid bolus for persistent hypotension -Per nephrologist no further Lasix on 03/16/2022 -nephrology restarted home oral Lasix on 03/17/2022 with very good urine output -Na improved to 134; lasix reduced to 20 mg BID 11/23, daughter requested increase back to home dose 40 mg BID on 11/24  and then Na back down to 128.  Today Na down to 123.  Daughter  requesting transfer to [facility].  Nephrology seen today and stopped diuretics, stopped sertraline.  Planning frequent sodium monitoring over next 24 hours.     acute on chronic combined systolic and diastolic congestive heart failure - Last echo 10/2021, EF of 35%, with grade 3 DD- restrictive. -03/16/22 -Wt gain and Volume overload concerns   -IV Lasix and Aldactone as above #1 -Daily weights and fluid input and output monitoring   Intake/Output Summary (Last 24 hours) at 03/22/2022 1139 Last data filed at 03/22/2022 0537 Gross per 24 hour  Intake 240 ml  Output 200 ml  Net 40 ml   Filed Weights   03/20/22 0610 03/21/22 0513 03/22/22 0500  Weight: 62.7 kg 63.2 kg  69.1 kg   chronically prolonged QT interval Prolonged at 531, chronic.  -Replaced electrolytes   Hyperkalemia/hypomagnesemia - Treated and resolved  -Normalized with Rx -Lasix and Aldactone as above #1   5)Controlled, type 2 Diabetes Mellitus  -A1c 6.6 reflecting good diabetic control PTA -Use Novolog/Humalog Sliding  scale insulin with Accu-Cheks/Fingersticks as ordered  -Continue to hold metformin while inpatient   CBG (last 3)  Recent Labs    03/21/22 2140 03/22/22 0728 03/22/22 1118  GLUCAP 134* 111* 167*   6)CAD, NATIVE VESSEL History of CABG 2006 and subsequent stent placement 2020.   -Chest pain-free, continue aspirin and metoprolol -Hold isosorbide due to soft BP   7)Essential hypertension, benign Stable. -Stopped Imdur due to soft BP  -Continue metoprolol due to tachycardia   8) acute on chronic urinary retention--patient has chronic urinary retention, usually sees [facility]rogynecologist at [facility] -Voiding difficulties improving with Flomax -in and out catheterization as needed   9)Social/Ethics--- plan of care and directives discussed with Erin Herman (daughter Ms Erin Herman) and granddaughter Erin Herman- -family will like to have further conversations amongst themselves prior to making a final decision -Full code for now -Overall prognosis is poor given advanced age, low EF and other comorbid conditions   10) anxiety disorder --- Zoloft was stopped on admission due to severe hyponatremia; discussed with nephrology (Dr. Kathrene Bongo) resuming home dose 50 mg daily; sodium trending back down so stopped sertraline again on 11/27.    -May use Xanax as needed    11)Transaminitis--suspect this may be related to hepatic congestion in the setting of acute on chronic combined CHF exacerbation  LFTs trending down with improvement in hepatic congestion/CHF.  CMP ordered for tomorrow AM.    12) generalized weakness/deconditioning---  PT eval completed 11/24, now rec SNF, TOC working on SNF placement.   DVT prophylaxis: SCDs, enoxaparin Code Status: Full  Family Communication: daughter at bedside updated daily, caretaker updated 11/27 at bedside Disposition: TOC working on SNF placement transfer to [facility] per daughter request Remains inpatient appropriate because: intensity    Consultants:   Procedures:   Antimicrobials:    Subjective: Pt reports pain better controlled after lidocaine patch applied.  Her appetite has not been good in last couple of days.       Objective: Vitals:   03/21/22 1437 03/21/22 2057 03/22/22 0331 03/22/22 0500  BP: 96/75 (!) 86/55 91/66   Pulse: (!) 110 71 (!) 106   Resp: 16 16 14    Temp: 97.7 F (36.5 C) 99.2 F (37.3 C) (!) 97 F (36.1 C)   TempSrc: Oral     SpO2: 95% 96% 95%   Weight:    69.1 kg  Height:        Intake/Output Summary (Last 24 hours) at 03/22/2022 1139 Last data filed at 03/22/2022 0537 Gross per 24 hour  Intake 240 ml  Output 200 ml  Net 40 ml   Filed Weights   03/20/22 0610 03/21/22 0513 03/22/22 0500  Weight: 62.7 kg 63.2 kg 69.1 kg   Examination:  General exam: frail elderly female, sleeping in bed; NAD.  Respiratory system: Clear to auscultation. Respiratory effort normal. Cardiovascular system: normal S1 & S2 heard. No JVD, murmurs, rubs, gallops or clicks. No pedal edema. Gastrointestinal system: Abdomen is nondistended, soft and nontender. No organomegaly or masses felt. Normal bowel sounds heard. Central nervous system: Alert and oriented. No focal neurological deficits. Extremities: Symmetric 5 x 5 power. Skin: No rashes, lesions or ulcers. Psychiatry: Judgement and  insight appear unable to determine. Mood & affect appropriate.   Data Reviewed: I have personally reviewed following labs and imaging studies  CBC: Recent Labs  Lab 03/16/22 0431  WBC 7.6  HGB 12.0  HCT 34.9*  MCV 94.3  PLT 237    Basic Metabolic Panel: Recent Labs  Lab 03/17/22 0348 03/18/22 0441 03/19/22 0438 03/20/22 0505 03/21/22 0700 03/22/22 0529 03/22/22 0846  NA 129* 134* 134* 129* 128* 123* 124*  K 4.1 4.0 4.1 4.9 4.4 4.6  --   CL 93* 97* 94* 90* 89* 89*  --   CO2 27 27 28 23 24 23   --   GLUCOSE 140* 118* 132* 145* 120* 117*  --   BUN 27* 27* 31* 43* 53* 57*  --   CREATININE 1.05* 1.11*  1.17* 1.36* 1.59* 1.56*  --   CALCIUM 8.6* 8.8* 9.1 9.3 8.7* 8.2*  --   PHOS 3.9  --   --   --   --   --   --     CBG: Recent Labs  Lab 03/21/22 1202 03/21/22 1654 03/21/22 2140 03/22/22 0728 03/22/22 1118  GLUCAP 149* 179* 134* 111* 167*    No results found for this or any previous visit (from the past 240 hour(s)).    Radiology Studies: No results found.  Scheduled Meds:  aspirin EC  81 mg Oral Daily   enoxaparin (LOVENOX) injection  30 mg Subcutaneous Q24H   famotidine  20 mg Oral Daily   feeding supplement (GLUCERNA SHAKE)  237 mL Oral TID BM   insulin aspart  0-9 Units Subcutaneous TID WC   levothyroxine  100 mcg Oral Q0600   melatonin  9 mg Oral QHS   metoprolol succinate  12.5 mg Oral Daily   Continuous Infusions:   LOS: 12 days   Time spent: 35 mins  Azzure Garabedian 03/24/22, MD How to contact the [facility] Attending or Consulting provider 7A - 7P or covering provider during after hours 7P -7A, for this patient?  Check the care team in [facility] and look for a) attending/consulting [facility] provider listed and b) the [facility] team listed Log into [email or web address] and use [facility] universal password to access. If you do not have the password, please contact the hospital operator. Locate the [facility] provider you are looking for under [facility] and page to a number that you can be directly reached. If you still have difficulty reaching the provider, please page the [facility] (Director on Call) for the Hospitalists listed on amion for assistance.  03/22/2022, 11:39 AM

## 2022-03-22 NOTE — Progress Notes (Signed)
Patient ID: Erin Herman, female   DOB: Nov 19, 1927, 88 y.o.   MRN: [id number] S:No complaints.  Daughter at bedside reports poor po intake, no nausea for past 2 days. O:BP 91/66 (BP Location: Left Arm)   Pulse (!) 106   Temp (!) 97 F (36.1 C)   Resp 14   Ht 5\' 4"  (1.626 m)   Wt 69.1 kg   LMP  (LMP Unknown)   SpO2 95%   BMI 26.15 kg/m   Intake/Output Summary (Last 24 hours) at 03/22/2022 0858 Last data filed at 03/22/2022 0537 Gross per 24 hour  Intake 240 ml  Output 200 ml  Net 40 ml   Intake/Output: I/O last 3 completed shifts: In: 480 [P.O.:480] Out: 200 [Urine:200]  Intake/Output this shift:  No intake/output data recorded. Weight change: 5.9 kg Gen: frail, elderly WF in NAD CVS: tachy at 106 Resp:CTA Abd: +BS, soft, NT/ND Ext: 1+ pretibial edema  Recent Labs  Lab 03/16/22 1224 03/16/22 1601 03/17/22 0348 03/18/22 0441 03/19/22 0438 03/20/22 0505 03/21/22 0700 03/22/22 0529  NA 132* 131* 129* 134* 134* 129* 128* 123*  K 3.9 4.2 4.1 4.0 4.1 4.9 4.4 4.6  CL 93* 92* 93* 97* 94* 90* 89* 89*  CO2 29 29 27 27 28 23 24 23   GLUCOSE 103* 139* 140* 118* 132* 145* 120* 117*  BUN 31* 31* 27* 27* 31* 43* 53* 57*  CREATININE 1.01* 1.11* 1.05* 1.11* 1.17* 1.36* 1.59* 1.56*  ALBUMIN 3.3*  --  3.5  --   --   --   --   --   CALCIUM 8.5* 8.4* 8.6* 8.8* 9.1 9.3 8.7* 8.2*  PHOS  --   --  3.9  --   --   --   --   --   AST 62*  --   --   --   --   --   --   --   ALT 83*  --   --   --   --   --   --   --    Liver Function Tests: Recent Labs  Lab 03/16/22 1224 03/17/22 0348  AST 62*  --   ALT 83*  --   ALKPHOS 114  --   BILITOT 0.7  --   PROT 5.8*  --   ALBUMIN 3.3* 3.5   No results for input(s): "LIPASE", "AMYLASE" in the last 168 hours. No results for input(s): "AMMONIA" in the last 168 hours. CBC: Recent Labs  Lab 03/16/22 0431  WBC 7.6  HGB 12.0  HCT 34.9*  MCV 94.3  PLT 237   Cardiac Enzymes: No results for input(s): "CKTOTAL", "CKMB",  "CKMBINDEX", "TROPONINI" in the last 168 hours. CBG: Recent Labs  Lab 03/21/22 0740 03/21/22 1202 03/21/22 1654 03/21/22 2140 03/22/22 0728  GLUCAP 121* 149* 179* 134* 111*    Iron Studies: No results for input(s): "IRON", "TIBC", "TRANSFERRIN", "FERRITIN" in the last 72 hours. Studies/Results: No results found.  aspirin EC  81 mg Oral Daily   enoxaparin (LOVENOX) injection  30 mg Subcutaneous Q24H   famotidine  20 mg Oral Daily   feeding supplement (GLUCERNA SHAKE)  237 mL Oral TID BM   insulin aspart  0-9 Units Subcutaneous TID WC   levothyroxine  100 mcg Oral Q0600   melatonin  9 mg Oral QHS   metoprolol succinate  12.5 mg Oral Daily   sertraline  50 mg Oral Daily    BMET    Component Value Date/Time  NA 123 (L) 03/22/2022 0529   K 4.6 03/22/2022 0529   CL 89 (L) 03/22/2022 0529   CO2 23 03/22/2022 0529   GLUCOSE 117 (H) 03/22/2022 0529   BUN 57 (H) 03/22/2022 0529   CREATININE 1.56 (H) 03/22/2022 0529   CALCIUM 8.2 (L) 03/22/2022 0529   GFRNONAA 31 (L) 03/22/2022 0529   GFRAA 59 (L) 11/07/2018 0620   CBC    Component Value Date/Time   WBC 7.6 03/16/2022 0431   RBC 3.70 (L) 03/16/2022 0431   HGB 12.0 03/16/2022 0431   HCT 34.9 (L) 03/16/2022 0431   PLT 237 03/16/2022 0431   MCV 94.3 03/16/2022 0431   MCH 32.4 03/16/2022 0431   MCHC 34.4 03/16/2022 0431   RDW 13.4 03/16/2022 0431   LYMPHSABS 1.1 03/10/2022 1430   MONOABS 0.6 03/10/2022 1430   EOSABS 0.0 03/10/2022 1430   BASOSABS 0.0 03/10/2022 1430     Assessment/Plan:  Hyponatremia - initially felt to be due to volume depletion (poor po intake with spironolactone and lasix).  Sodium was 119 on admission.  Uosm 144, Sosm 257, UNa <10 on 03/10/22.  She was given tolvaptan 15 mg on 03/14/22 with significant diuresis and improvement of sodium.  Zoloft stopped and restarted IV lasix due to edema.  Sodium level had been stable at 128-134 but dropped overnight to 123.  No nausea for past 2 days.  Still has  edema and poor po intake.  Will recheck Sosm, Uosm, and UNa.  Hold lasix for now.  Check Na now and every 4 hours.  May need to transfer to ICU if Na continues to fall for 3% saline.  Will discontinue Zoloft and follow sodium levels. Needs to eat more protein. AKI/CKD stage III - possibly due to hypotension and diuretics.  Hold lasix and follow. Acute on chronic combined systolic and diastolic CHF - hold lasix for now and re-evaluate urine studies. DM type 2 - per primary Hypotension - per daughter this is chronic. Abnormal LFT's - elevated AST and ALT.  Need to recheck.  Avoid tolvaptan for now.  Irena Cords, MD [facility]

## 2022-03-22 NOTE — Progress Notes (Signed)
   03/22/22 2100  Assess: MEWS Score  Level of Consciousness Responds to Pain  Assess: MEWS Score  MEWS Temp 0  MEWS Systolic 0  MEWS Pulse 1  MEWS RR 0  MEWS LOC 2  MEWS Score 3  MEWS Score Color Yellow  Assess: if the MEWS score is Yellow or Red  Were vital signs taken at a resting state? Yes  Does the patient meet 2 or more of the SIRS criteria? No  Treat  MEWS Interventions Other (Comment) (will continue to monitor)  Pain Score Asleep  Escalate  MEWS: Escalate Yellow: discuss with charge nurse/RN and consider discussing with provider and RRT  Notify: Charge Nurse/RN  Name of Charge Nurse/RN Notified Lonn Georgia, RN  Date Charge Nurse/RN Notified 03/22/22  Time Charge Nurse/RN Notified 2223  Provider Notification  Provider Name/Title Dr. Thomes Dinning  Date Provider Notified 03/22/22  Time Provider Notified 2223  Notification Reason Other (Comment) (change to MEWS 3)

## 2022-03-22 NOTE — Progress Notes (Signed)
Daughter is concerned that patient is not eating. She is aware that doctors want to increase protein intake but her mother is not eating. Her mother remained asleep while I was in the room talking with the daughter

## 2022-03-23 ENCOUNTER — Inpatient Hospital Stay (HOSPITAL_COMMUNITY): Payer: Medicare Other

## 2022-03-23 DIAGNOSIS — R7401 Elevation of levels of liver transaminase levels: Secondary | ICD-10-CM | POA: Diagnosis not present

## 2022-03-23 DIAGNOSIS — I5042 Chronic combined systolic (congestive) and diastolic (congestive) heart failure: Secondary | ICD-10-CM | POA: Diagnosis not present

## 2022-03-23 DIAGNOSIS — E871 Hypo-osmolality and hyponatremia: Secondary | ICD-10-CM | POA: Diagnosis not present

## 2022-03-23 LAB — GLUCOSE, CAPILLARY
Glucose-Capillary: 129 mg/dL — ABNORMAL HIGH (ref 70–99)
Glucose-Capillary: 233 mg/dL — ABNORMAL HIGH (ref 70–99)
Glucose-Capillary: 122 mg/dL — ABNORMAL HIGH (ref 70–99)
Glucose-Capillary: 150 mg/dL — ABNORMAL HIGH (ref 70–99)

## 2022-03-23 LAB — CBC
HCT: 33.4 % — ABNORMAL LOW (ref 36.0–46.0)
Hemoglobin: 11.3 g/dL — ABNORMAL LOW (ref 12.0–15.0)
MCH: 31.9 pg (ref 26.0–34.0)
MCHC: 33.8 g/dL (ref 30.0–36.0)
MCV: 94.4 fL (ref 80.0–100.0)
Platelets: 224 10*3/uL (ref 150–400)
RBC: 3.54 MIL/uL — ABNORMAL LOW (ref 3.87–5.11)
RDW: 14 % (ref 11.5–15.5)
WBC: 8.1 10*3/uL (ref 4.0–10.5)
nRBC: 0 % (ref 0.0–0.2)

## 2022-03-23 LAB — BASIC METABOLIC PANEL
Anion gap: 13 (ref 5–15)
BUN: 54 mg/dL — ABNORMAL HIGH (ref 8–23)
CO2: 25 mmol/L (ref 22–32)
Calcium: 8.6 mg/dL — ABNORMAL LOW (ref 8.9–10.3)
Chloride: 86 mmol/L — ABNORMAL LOW (ref 98–111)
Creatinine, Ser: 1.45 mg/dL — ABNORMAL HIGH (ref 0.44–1.00)
GFR, Estimated: 34 mL/min — ABNORMAL LOW (ref >60)
Glucose, Bld: 111 mg/dL — ABNORMAL HIGH (ref 70–99)
Potassium: 4.4 mmol/L (ref 3.5–5.1)
Sodium: 124 mmol/L — ABNORMAL LOW (ref 135–145)

## 2022-03-23 LAB — SODIUM
Sodium: 123 mmol/L — ABNORMAL LOW (ref 135–145)
Sodium: 124 mmol/L — ABNORMAL LOW (ref 135–145)
Sodium: 124 mmol/L — ABNORMAL LOW (ref 135–145)
Sodium: 125 mmol/L — ABNORMAL LOW (ref 135–145)

## 2022-03-23 LAB — HEPATITIS PANEL, ACUTE
HCV Ab: NONREACTIVE
Hep A IgM: NONREACTIVE
Hep B C IgM: NONREACTIVE
Hepatitis B Surface Ag: NONREACTIVE

## 2022-03-23 LAB — COMPREHENSIVE METABOLIC PANEL
ALT: 892 U/L — ABNORMAL HIGH (ref 0–44)
AST: 752 U/L — ABNORMAL HIGH (ref 15–41)
Albumin: 3.4 g/dL — ABNORMAL LOW (ref 3.5–5.0)
Alkaline Phosphatase: 260 U/L — ABNORMAL HIGH (ref 38–126)
Anion gap: 12 (ref 5–15)
BUN: 57 mg/dL — ABNORMAL HIGH (ref 8–23)
CO2: 25 mmol/L (ref 22–32)
Calcium: 8.6 mg/dL — ABNORMAL LOW (ref 8.9–10.3)
Chloride: 88 mmol/L — ABNORMAL LOW (ref 98–111)
Creatinine, Ser: 1.43 mg/dL — ABNORMAL HIGH (ref 0.44–1.00)
GFR, Estimated: 34 mL/min — ABNORMAL LOW (ref >60)
Glucose, Bld: 138 mg/dL — ABNORMAL HIGH (ref 70–99)
Potassium: 4.6 mmol/L (ref 3.5–5.1)
Sodium: 125 mmol/L — ABNORMAL LOW (ref 135–145)
Total Bilirubin: 1.1 mg/dL (ref 0.3–1.2)
Total Protein: 6.1 g/dL — ABNORMAL LOW (ref 6.5–8.1)

## 2022-03-23 LAB — MAGNESIUM: Magnesium: 1.9 mg/dL (ref 1.7–2.4)

## 2022-03-23 LAB — ACETAMINOPHEN LEVEL: Acetaminophen (Tylenol), Serum: <10 ug/mL — ABNORMAL LOW (ref 10–30)

## 2022-03-23 LAB — OSMOLALITY, URINE: Osmolality, Ur: 445 mOsm/kg (ref 300–900)

## 2022-03-23 LAB — PROTIME-INR
INR: 1.5 — ABNORMAL HIGH (ref 0.8–1.2)
Prothrombin Time: 18.2 seconds — ABNORMAL HIGH (ref 11.4–15.2)

## 2022-03-23 LAB — CREATININE, URINE, RANDOM: Creatinine, Urine: 76.04 mg/dL

## 2022-03-23 LAB — BRAIN NATRIURETIC PEPTIDE: B Natriuretic Peptide: 1103.2 pg/mL — ABNORMAL HIGH (ref 0.0–100.0)

## 2022-03-23 LAB — SODIUM, URINE, RANDOM: Sodium, Ur: 12 mmol/L

## 2022-03-23 MED ORDER — VITAMIN K1 10 MG/ML IJ SOLN
5.0000 mg | Freq: Every day | INTRAVENOUS | Status: AC
  Administered 2022-03-23 – 2022-03-25 (×3): 5 mg via INTRAVENOUS
  Filled 2022-03-23 (×4): qty 0.5

## 2022-03-23 MED ORDER — SODIUM CHLORIDE 0.9 % IV SOLN: INTRAVENOUS | Status: AC

## 2022-03-23 NOTE — Progress Notes (Signed)
No acute events overnight. Daughter remained at bedside with patient. Erin Herman

## 2022-03-23 NOTE — Progress Notes (Signed)
PROGRESS NOTE                                                                                                                                                                                                             Patient Demographics:    Erin Herman, is a 88 y.o. female, DOB - May 07, 1927, HWK:[id number]  Outpatient Primary MD for the patient is Kirstie Peri, MD    LOS - 13  Admit date - 03/10/2022    Chief Complaint  Patient presents with   Nausea       Brief Narrative (HPI from H&P)   88 y.o. female with medical history significant for systolic and diastolic CHF, CABG, left bundle branch block, diabetes mellitus, hypertension. Patient presented to the ED with complaints of nausea ongoing for about 5 days hospital visit, she presented to [facility], ER where she was diagnosed with UTI, dehydration, hyponatremia and she was admitted on 03/10/2022.  She was transferred to [facility] for further care of hyponatremia on day 13 of her hospital stay on 03/23/2022.   Subjective:    Erin Herman today has, No headache, No chest pain, No abdominal pain - No Nausea, No new weakness tingling or numbness, mild orthopnea   Assessment  & Plan :     Hyponatremia moderate.  Initial sodium in the ER was 119, currently around 1 25-1 23, she has been seen by nephrology they are guiding the treatment for hyponatremia, to me it appears that she has slight fluid overload as evidenced by her physical exam, also her LFTs are high questioning hepatic congestion.  Will check BNP, will request nephrology to consider Samsca or Lasix if they think it is appropriate.  Continue free water restriction.  Combined systolic and diastolic congestive heart failure (HCC) =  ? mild fluid overload, last echo 10/2021, EF of 35%, with grade 3 DD- restrictive.  Management per nephrology.   Asymptomatic transaminitis.  LFTs elevated on  03/23/2022.  Symptom-free, check acute hepatitis panel, acetaminophen levels, right upper quadrant ultrasound, INR.  GI input.    CAD, NATIVE VESSEL - History of CABG 2006 and subsequent stent placement 2020.  EKG showing regular rhythm, read as atrial flutter.   In the setting of hypokalemia and hypomagnesemia.  Follows with cardiologist Dr. Diona Browner, no documentation of prior atrial fibrillation or flutter.  She is chest pain and symptom-free continue aspirin and beta-blocker for secondary prevention.   Essential hypertension, benign - Stable, on Imdur, metoprolol  Prolonged QT interval, chronic. Prolonged at 531, on admission now 493 on last EKG in the setting of left bundle branch block.  Stable.  Hypothyroidism.  On Synthroid.  Stable TSH.    Diabetes (HCC)  - SSI- S, -Hold metformin  Lab Results  Component Value Date   HGBA1C 6.6 (H) 03/11/2022   CBG (last 3)  Recent Labs    03/22/22 1709 03/22/22 2024 03/23/22 0722  GLUCAP 142* 144* 129*   Lab Results  Component Value Date   TSH 4.512 (H) 03/10/2022        Condition - Extremely Guarded  Family Communication  :  daughter [name] [phone]  - 03/23/22  Code Status :  Full  Consults  :  Renal, GI  PUD Prophylaxis :    Procedures  :            Disposition Plan  :    Status is: Inpatient  DVT Prophylaxis  :    enoxaparin (LOVENOX) injection 30 mg Start: 03/15/22 2000   Lab Results  Component Value Date   PLT 224 03/23/2022    Diet :  Diet Order             Diet Carb Modified Fluid consistency: Thin; Room service appropriate? Yes; Fluid restriction: 1500 mL Fluid  Diet effective now                    Inpatient Medications  Scheduled Meds:  aspirin EC  81 mg Oral Daily   enoxaparin (LOVENOX) injection  30 mg Subcutaneous Q24H   famotidine  20 mg Oral Daily   feeding supplement (GLUCERNA SHAKE)  237 mL Oral TID BM   insulin aspart  0-9 Units Subcutaneous TID WC   levothyroxine  100  mcg Oral Q0600   melatonin  9 mg Oral QHS   metoprolol succinate  12.5 mg Oral Daily   Continuous Infusions: PRN Meds:.acetaminophen **OR** acetaminophen, ALPRAZolam, alum & mag hydroxide-simeth, fentaNYL (SUBLIMAZE) injection, levalbuterol, lidocaine, loperamide, mouth rinse, oxyCODONE, polyethylene glycol, promethazine  Antibiotics  :    Anti-infectives (From admission, onward)    None         Objective:   Vitals:   03/23/22 0601 03/23/22 0800 03/23/22 0815 03/23/22 1016  BP: 109/78  91/66 103/69  Pulse: (!) 109 98 (!) 108 72  Resp: 20  20 18   Temp:   98.1 F (36.7 C) 98.1 F (36.7 C)  TempSrc:   Oral Oral  SpO2: 99%  95% 93%  Weight:      Height:        Wt Readings from Last 3 Encounters:  03/23/22 68.3 kg  12/14/21 58.8 kg  11/23/21 59.7 kg     Intake/Output Summary (Last 24 hours) at 03/23/2022 1121 Last data filed at 03/23/2022 0500 Gross per 24 hour  Intake 720 ml  Output 950 ml  Net -230 ml     Physical Exam  Awake Alert, No new F.N deficits, Normal affect [place].AT,PERRAL Supple Neck, No JVD,   Symmetrical Chest wall movement, Good air movement bilaterally, few rales RRR,No Gallops,Rubs or new Murmurs,  +ve B.Sounds, Abd Soft, No tenderness,   No Cyanosis, trace edema       Data Review:    Recent Labs  Lab 03/23/22 1037  WBC 8.1  HGB 11.3*  HCT 33.4*  PLT  224  MCV 94.4  MCH 31.9  MCHC 33.8  RDW 14.0    Recent Labs  Lab 03/16/22 1224 03/16/22 1601 03/17/22 0348 03/18/22 0441 03/19/22 0438 03/20/22 0505 03/21/22 0700 03/22/22 0529 03/22/22 0846 03/22/22 1549 03/22/22 2004 03/23/22 0023 03/23/22 0411 03/23/22 0809  NA 132*   < > 129*   < > 134* 129* 128* 123*   < > 123* 122* 124* 125* 123*  K 3.9   < > 4.1   < > 4.1 4.9 4.4 4.6  --   --   --   --  4.6  --   CL 93*   < > 93*   < > 94* 90* 89* 89*  --   --   --   --  88*  --   CO2 29   < > 27   < > 28 23 24 23   --   --   --   --  25  --   GLUCOSE 103*   < > 140*   < >  132* 145* 120* 117*  --   --   --   --  138*  --   BUN 31*   < > 27*   < > 31* 43* 53* 57*  --   --   --   --  57*  --   CREATININE 1.01*   < > 1.05*   < > 1.17* 1.36* 1.59* 1.56*  --   --   --   --  1.43*  --   AST 62*  --   --   --   --   --   --   --   --   --   --   --  752*  --   ALT 83*  --   --   --   --   --   --   --   --   --   --   --  892*  --   ALKPHOS 114  --   --   --   --   --   --   --   --   --   --   --  260*  --   BILITOT 0.7  --   --   --   --   --   --   --   --   --   --   --  1.1  --   ALBUMIN 3.3*  --  3.5  --   --   --   --   --   --   --   --   --  3.4*  --   CALCIUM 8.5*   < > 8.6*   < > 9.1 9.3 8.7* 8.2*  --   --   --   --  8.6*  --    < > = values in this interval not displayed.     Signature  -   M.D on 03/23/2022 at 11:21 AM   -  To page go to [email or web address]

## 2022-03-23 NOTE — Progress Notes (Signed)
Physical Therapy Treatment Patient Details Name: Erin Herman MRN: [id number] DOB: Mar 06, 1928 Today's Date: 03/23/2022   History of Present Illness Erin Herman is a 88 y.o. female with medical history significant for systolic and diastolic CHF, CABG, left bundle branch block, diabetes mellitus, hypertension. Patient presented to the ED with complaints of nausea ongoing for about 5 days now. 1 episode of vomiting at onset of nausea. Over the past 5 days, reports mild confusion over the past 5 days, that patient is taking longer to respond to questions and has been having some gait abnormality.    PT Comments    Pt reporting buttocks pain, eager to mobilize OOB to help. Pt overall requiring light physical assist for room-distance gait, fatigues quickly and reports it feels her legs are giving out. PT placed mobility specialist order to increase OOB to daily, will continue to follow.      Recommendations for follow up therapy are one component of a multi-disciplinary discharge planning process, led by the attending physician.  Recommendations may be updated based on patient status, additional functional criteria and insurance authorization.  Follow Up Recommendations  Skilled nursing-short term rehab (<3 hours/day) Can patient physically be transported by private vehicle: Yes   Assistance Recommended at Discharge Set up Supervision/Assistance  Patient can return home with the following A little help with walking and/or transfers;A little help with bathing/dressing/bathroom;Assistance with cooking/housework;Help with stairs or ramp for entrance   Equipment Recommendations  None recommended by PT    Recommendations for Other Services       Precautions / Restrictions Precautions Precautions: Fall Restrictions Weight Bearing Restrictions: No     Mobility  Bed Mobility Overal bed mobility: Needs Assistance Bed Mobility: Supine to Sit, Sit to Supine     Supine to sit:  Min assist Sit to supine: Min assist   General bed mobility comments: assist for LE and truncal management    Transfers Overall transfer level: Needs assistance Equipment used: Rolling walker (2 wheels) Transfers: Sit to/from Stand Sit to Stand: Min assist           General transfer comment: assist to rise and steady, slowed    Ambulation/Gait Ambulation/Gait assistance: Min assist Gait Distance (Feet): 25 Feet Assistive device: Rolling walker (2 wheels) Gait Pattern/deviations: Trunk flexed, Step-through pattern, Decreased stride length Gait velocity: decr     General Gait Details: assist to steady and at times manage RW, cues for upright posture multiple times   Stairs             Wheelchair Mobility    Modified Rankin (Stroke Patients Only)       Balance Overall balance assessment: Needs assistance Sitting-balance support: Feet supported, No upper extremity supported Sitting balance-Leahy Scale: Good Sitting balance - Comments: seated EOB   Standing balance support: Bilateral upper extremity supported, During functional activity, Reliant on assistive device for balance Standing balance-Leahy Scale: Fair Standing balance comment: fair/good with RW                            Cognition Arousal/Alertness: Awake/alert Behavior During Therapy: WFL for tasks assessed/performed Overall Cognitive Status: Within Functional Limits for tasks assessed                                          Exercises General Exercises - Lower Extremity Long Arc Quad: Seated,  AROM, Strengthening, Both, 20 reps    General Comments        Pertinent Vitals/Pain Pain Assessment Pain Assessment: Faces Faces Pain Scale: Hurts little more Pain Location: sacrum Pain Descriptors / Indicators: Sore, Grimacing Pain Intervention(s): Limited activity within patient's tolerance, Monitored during session, Repositioned    Home Living                           Prior Function            PT Goals (current goals can now be found in the care plan section) Acute Rehab PT Goals Patient Stated Goal: return home after reahb PT Goal Formulation: With patient/family Time For Goal Achievement: 04/02/22 Potential to Achieve Goals: Good Progress towards PT goals: Progressing toward goals    Frequency    Min 2X/week      PT Plan Current plan remains appropriate    Co-evaluation              AM-PAC PT "6 Clicks" Mobility   Outcome Measure  Help needed turning from your back to your side while in a flat bed without using bedrails?: A Little Help needed moving from lying on your back to sitting on the side of a flat bed without using bedrails?: A Little Help needed moving to and from a bed to a chair (including a wheelchair)?: A Little Help needed standing up from a chair using your arms (e.g., wheelchair or bedside chair)?: A Little Help needed to walk in hospital room?: A Little Help needed climbing 3-5 steps with a railing? : A Lot 6 Click Score: 17    End of Session   Activity Tolerance: Patient tolerated treatment well;Patient limited by fatigue Patient left: with call bell/phone within reach;in bed;with bed alarm set Nurse Communication: Mobility status PT Visit Diagnosis: Unsteadiness on feet (R26.81);Other abnormalities of gait and mobility (R26.89);Muscle weakness (generalized) (M62.81)     Time: 2919-1660 PT Time Calculation (min) (ACUTE ONLY): 21 min  Charges:  $Gait Training: 8-22 mins                     Marye Round, PT DPT Acute Rehabilitation Services Pager [phone]  Office [phone]    Truddie Coco 03/23/2022, 4:57 PM

## 2022-03-23 NOTE — Consult Note (Addendum)
[facility] Consult: 12:42 PM 03/23/2022  LOS: 13 days    Referring Provider: Dr Ronnie Derby  Primary Care Physician:  Monico Blitz, MD in [facility] Primary Gastroenterologist:  unassigned.     Reason for Consultation:  elevated LFTs   HPI: Erin Herman is a 88 y.o. female.  PMH CAD, CABG/bioprosthetic valve 2006, DES 2020.  Ao stenosis.  Prolonged QT.  Diastolic and systolic heart failure.  LVEF 35%.  Moderate to severe mitral valve regurgitation.  Moderate elevation pulmonary pressures.  Mild to moderate tricuspid regurgitation.  Recurrent UTIs.  Anemia Remote EGD in the ED in and hx HH, GERD.  No known previous colonoscopy.    Admission to [facility] on 11/15.  5 d nausea.  Dx as UTI, dehydration, hyponatremia, hyperkalemia.  AKI.  Sodium rebounded from 119 to the 123-125 range.  Urine sodium less than 10.  Suspicion of volume overload, treated with free water restriction.  Tolvaptan given on 11/19.   Not clear why she was transferred to [facility] for management of hyponatremia on 11/28  Initially LFTs were normal but rising during course of hospitalization. T. bili 0.7 ..1.1.  Alk phos 145..260.  AST/ALT 101-103...[id number]. APAP level< 10.   No  hepatitis serologies. Na 123.  GFR 34.  BNP 1103 Hgb 11.3, mcv normal.   INR 1.5.   03/10/22 CTAP w contrast: Marked distention urinary bladder.  Uncomplicated colon diverticulosis.  No evidence for obstruction or ileus.  Right greater than left small pleural effusions.  Cardiomegaly.  Aortic atherosclerosis.  Coronary artery atherosclerosis.    Ultrasound abdomen ordered for today.    Sxs include anorexia, infrequent nausea, bilious emesis.  No abd pain.  Treated w 3 d course po Cipro just PTA, last dose 11/15.   No hx liver issues No constipation, had brown stool  this AM.    No ETOH.  Husband died on 2023-02-06, was under hospice care.   Pt generally very active.  No longer driving.       Past Medical History:  Diagnosis Date   Anemia    iron   Anxiety    Aortic stenosis    Bioprosthetic AVR 2006   CAD (coronary artery disease), native coronary artery    Multivessel status post CABG 2006, occluded SVG to circumflex, DES to circumflex July 2020   Cardiomyopathy [facility])    Chronic kidney disease    Depression    Dysphagia    Essential hypertension    Family history of adverse reaction to anesthesia    N&V   GERD (gastroesophageal reflux disease)    Headache    History of hiatal hernia    HOH (hard of hearing)    LBBB (left bundle branch block)    Mixed hyperlipidemia    NSTEMI (non-ST elevated myocardial infarction) [facility])    July 2020   Osteoarthritis    Postoperative nausea and vomiting    Morphine   Type 2 diabetes mellitus ([place])    UTI (lower urinary tract infection)     Past Surgical History:  Procedure Laterality  Date   ABDOMINAL HYSTERECTOMY     partial   AORTIC VALVE REPLACEMENT  2006   Dr. Roxy Manns - 23 mm [place] stentless cadaver valve   CARDIAC CATHETERIZATION  2020   with stents   [place] GRAFT  2006    Dr. Roxy Manns - LIMA to LAD, SVG to circumflex   CORONARY STENT INTERVENTION N/A 11/06/2018   Procedure: CORONARY STENT INTERVENTION;  Surgeon: Nelva Bush, MD;  Location: [place] [facility];  Service: Cardiovascular;  Laterality: N/A;   CORONARY/GRAFT ANGIOGRAPHY N/A 11/06/2018   Procedure: CORONARY/GRAFT ANGIOGRAPHY;  Surgeon: Nelva Bush, MD;  Location: [place] [facility];  Service: Cardiovascular;  Laterality: N/A;   LEFT HEART CATH AND CORS/GRAFTS ANGIOGRAPHY N/A 11/07/2020   Procedure: LEFT HEART CATH AND CORS/GRAFTS ANGIOGRAPHY;  Surgeon: Burnell Blanks, MD;  Location: [place] [facility];  Service: Cardiovascular;  Laterality: N/A;   THYROID LOBECTOMY Right    TOTAL HIP  ARTHROPLASTY Right 03/01/2017   Procedure: RIGHT TOTAL HIP ARTHROPLASTY ANTERIOR APPROACH;  Surgeon: Paralee Cancel, MD;  Location: [facility];  Service: Orthopedics;  Laterality: Right;  70 mins   TOTAL HIP ARTHROPLASTY Left 04/29/2020   Procedure: TOTAL HIP ARTHROPLASTY ANTERIOR APPROACH;  Surgeon: Paralee Cancel, MD;  Location: [facility];  Service: Orthopedics;  Laterality: Left;  70 mins   TOTAL KNEE ARTHROPLASTY Right 2006   TOTAL KNEE ARTHROPLASTY Left 05/20/2014   DR Noemi Chapel   TOTAL KNEE ARTHROPLASTY Left 05/20/2014   Procedure: LEFT TOTAL KNEE ARTHROPLASTY;  Surgeon: Lorn Junes, MD;  Location: [place];  Service: Orthopedics;  Laterality: Left;    Prior to Admission medications   Medication Sig Start Date End Date Taking? Authorizing Provider  aspirin EC 81 MG tablet Take 1 tablet (81 mg total) by mouth daily. Swallow whole. 06/26/20  Yes Satira Sark, MD  Cholecalciferol (VITAMIN D3) 125 MCG (5000 UT) CAPS Take 5,000 Units by mouth daily.   Yes [provider]  CINNAMON PO Take 1 tablet by mouth 2 (two) times daily.   Yes [provider]  ciprofloxacin (CIPRO) 500 MG tablet Take 500 mg by mouth 2 (two) times daily. 03/08/22  Yes [provider]  Coenzyme Q10 (COQ10 PO) Take 1 tablet by mouth daily.   Yes [provider]  famotidine (PEPCID) 20 MG tablet Take 10 mg by mouth 2 (two) times daily.   Yes [provider]  furosemide (LASIX) 40 MG tablet Take 40 mg by mouth 2 (two) times daily. 08/26/21  Yes [provider]  isosorbide dinitrate (ISORDIL) 20 MG tablet Take 1 tablet (20 mg total) by mouth 2 (two) times daily. 12/22/21  Yes Satira Sark, MD  levothyroxine (SYNTHROID) 88 MCG tablet Take 88 mcg by mouth daily before breakfast.   Yes [provider]  metFORMIN (GLUCOPHAGE) 500 MG tablet Take 1 tablet (500 mg total) by mouth 2 (two) times daily with a meal. 11/10/18  Yes Mikhail, [place], DO  metoprolol succinate (TOPROL XL)  25 MG 24 hr tablet Take 1 tablet (25 mg total) by mouth daily. 01/14/22  Yes Satira Sark, MD  Polyethyl Glycol-Propyl Glycol (SYSTANE OP) Place 1 drop into both eyes as needed (dry eye).   Yes [provider]  potassium chloride SA (K-DUR,KLOR-CON) 20 MEQ tablet Take 1 tablet (20 mEq total) by mouth daily after supper. 01/26/17  Yes Satira Sark, MD  psyllium (METAMUCIL) 58.6 % packet Take 1 packet by mouth  daily.   Yes [provider]  rosuvastatin (CRESTOR) 5 MG tablet Take 1 tablet (5 mg total) by mouth daily. 12/01/21  Yes Satira Sark, MD  sertraline (ZOLOFT) 50 MG tablet Take 50 mg by mouth daily. 09/13/21  Yes [provider]  spironolactone (ALDACTONE) 25 MG tablet Take 0.5 tablets (12.5 mg total) by mouth daily. 07/06/16  Yes Herminio Commons, MD  vitamin C (ASCORBIC ACID) 500 MG tablet Take 500 mg by mouth daily.   Yes [provider]  Vitamin E 180 MG (400 UNIT) CAPS Take 400 Units by mouth daily.   Yes [provider]  acetaminophen (TYLENOL) 325 MG tablet Take 2 tablets (650 mg total) by mouth every 6 (six) hours as needed. Patient taking differently: Take 650 mg by mouth every 6 (six) hours as needed for mild pain. 06/18/21   Jaynee Eagles, PA-C  ONETOUCH ULTRA test strip USE 1 STRIP TO [place] DAILY 07/29/21   [provider]    Scheduled Meds:  aspirin EC  81 mg Oral Daily   enoxaparin (LOVENOX) injection  30 mg Subcutaneous Q24H   famotidine  20 mg Oral Daily   feeding supplement (GLUCERNA SHAKE)  237 mL Oral TID BM   insulin aspart  0-9 Units Subcutaneous TID WC   levothyroxine  100 mcg Oral Q0600   melatonin  9 mg Oral QHS   metoprolol succinate  12.5 mg Oral Daily   Infusions:  PRN Meds: acetaminophen **OR** acetaminophen, ALPRAZolam, alum & mag hydroxide-simeth, fentaNYL (SUBLIMAZE) injection, levalbuterol, lidocaine, loperamide, mouth rinse, oxyCODONE, polyethylene glycol,  promethazine   Allergies as of 03/10/2022 - Review Complete 03/10/2022  Allergen Reaction Noted   Lipitor [atorvastatin]  01/09/2019   Morphine Nausea And Vomiting    Nitroglycerin Other (See Comments) 08/09/2021   Penicillins Swelling and Other (See Comments)    Reclast [zoledronic acid]  02/27/2018    Family History  Problem Relation Age of Onset   Heart Problems Mother    Diabetes Father    Cirrhosis Father    Diabetes Other     Social History   Socioeconomic History   Marital status: Married    Spouse name: Not on file   Number of children: Not on file   Years of education: Not on file   Highest education level: Not on file  Occupational History   Not on file  Tobacco Use   Smoking status: Never   Smokeless tobacco: Never  Vaping Use   Vaping Use: Never used  Substance and Sexual Activity   Alcohol use: No    Alcohol/week: 0.0 standard drinks of alcohol   Drug use: No   Sexual activity: Not Currently  Other Topics Concern   Not on file  Social History Narrative   Not on file   Social Determinants of Health   Financial Resource Strain: Not on file  Food Insecurity: No Food Insecurity (03/16/2022)   Hunger Vital Sign    Worried About Running Out of Food in the Last Year: Never true    Ran Out of Food in the Last Year: Never true  Transportation Needs: No Transportation Needs (03/16/2022)   PRAPARE - Hydrologist (Medical): No    Lack of Transportation (Non-Medical): No  Physical Activity: Not on file  Stress: Not on file  Social Connections: Not on file  Intimate Partner Violence: Not At Risk (03/16/2022)   Humiliation, Afraid, Rape, and Kick questionnaire  Fear of Current or Ex-Partner: No    Emotionally Abused: No    Physically Abused: No    Sexually Abused: No    REVIEW OF SYSTEMS: Constitutional: Weakness ENT:  No nose bleeds Pulm: No dyspnea at rest.  No cough CV:  No palpitations, no LE edema.  GU:  No  hematuria, no frequency GI: See HPI. Heme: No unusual bleeding or bruising Transfusions: None Neuro:  No headaches, no peripheral tingling or numbness.  No visual disturbance. Derm:  No itching, no rash or sores.  Endocrine:  No sweats or chills.  No polyuria or dysuria Immunization: Reviewed. Travel:  None beyond local counties in last few months.    PHYSICAL EXAM: Vital signs in last 24 hours: Vitals:   03/23/22 0815 03/23/22 1016  BP: 91/66 103/69  Pulse: (!) 108 72  Resp: 20 18  Temp: 98.1 F (36.7 C) 98.1 F (36.7 C)  SpO2: 95% 93%   Wt Readings from Last 3 Encounters:  03/23/22 68.3 kg  12/14/21 58.8 kg  11/23/21 59.7 kg    General: Frail, aged, sleeping but arousable.  Mildly jaundiced Head: No facial swelling, asymmetry or signs of head trauma. Eyes: Slight scleral icterus Ears:   Slight hearing deficit Nose: No congestion or discharge Mouth: Good dentition.  Tongue midline.  Mucosa moist, pink, clear. Neck: No JVD, no masses, no thyromegaly Lungs: Somewhat diminished but clear.  No labored breathing.  No cough. Heart: Irregular heart rate.  Heart sounds are distant.  Do not appreciate murmurs rubs or gallops. Abdomen: Minimally distended but soft and nontender.  Active bowel sounds.  No HSM, masses, bruits.  Slight palpable umbilical hernia..   Rectal: Deferred Musc/Skeltl: No joint redness, swelling or significant deformities other than arthritic changes in the hands/fingers. Extremities: CCE.  Both feet are warm to the touch. Neurologic: Appropriate.  Sleepy but arousable and oriented x 3.  Moves all 4 limbs, strength not tested.  No tremors, no asterixis. Skin: Minor jaundice Nodes: No cervical adenopathy Psych: Calm.  Intake/Output from previous day: 11/27 0701 - 11/28 0700 In: 960 [P.O.:960] Out: 950 [Urine:950] Intake/Output this shift: Total I/O In: 120 [P.O.:120] Out: -   LAB RESULTS: Recent Labs    03/23/22 1037  WBC 8.1  HGB 11.3*  HCT  33.4*  PLT 224   BMET Lab Results  Component Value Date   NA 123 (L) 03/23/2022   NA 125 (L) 03/23/2022   NA 124 (L) 03/23/2022   K 4.6 03/23/2022   K 4.6 03/22/2022   K 4.4 03/21/2022   CL 88 (L) 03/23/2022   CL 89 (L) 03/22/2022   CL 89 (L) 03/21/2022   CO2 25 03/23/2022   CO2 23 03/22/2022   CO2 24 03/21/2022   GLUCOSE 138 (H) 03/23/2022   GLUCOSE 117 (H) 03/22/2022   GLUCOSE 120 (H) 03/21/2022   BUN 57 (H) 03/23/2022   BUN 57 (H) 03/22/2022   BUN 53 (H) 03/21/2022   CREATININE 1.43 (H) 03/23/2022   CREATININE 1.56 (H) 03/22/2022   CREATININE 1.59 (H) 03/21/2022   CALCIUM 8.6 (L) 03/23/2022   CALCIUM 8.2 (L) 03/22/2022   CALCIUM 8.7 (L) 03/21/2022   LFT Recent Labs    03/23/22 0411  PROT 6.1*  ALBUMIN 3.4*  AST 752*  ALT 892*  ALKPHOS 260*  BILITOT 1.1   PT/INR Lab Results  Component Value Date   INR 1.5 (H) 03/23/2022   INR 0.95 05/07/2014   Hepatitis Panel No results for input(s): "  HEPBSAG", "HCVAB", "HEPAIGM", "HEPBIGM" in the last 72 hours. C-Diff No components found for: "CDIFF" Lipase     Component Value Date/Time   LIPASE 40 03/10/2022 1430    Drugs of Abuse  No results found for: "LABOPIA", "COCAINSCRNUR", "LABBENZ", "AMPHETMU", "THCU", "LABBARB"   RADIOLOGY STUDIES: No results found.    IMPRESSION:     Elevated LFTs.  Hospital etiologies include drug-induced liver injury from the ciprofloxacin.  She had just a short, apparently 3-day course of this medication and it has been off for more than 2 weeks.  Unlikely at the age of 63 that she has a diagnosis of metabolic related liver issues such as Wilson's disease, for 1 antitrypsin.  Possibly could have autoimmune hepatitis.  Hepatitis ABC have not been ruled out.  Elevated BNP and known systolic and diastolic  so passive liver congestion in ddx.     Hyponatremia    Elevated BNP.  Previous bioprosthetic AVR.  Last echocardiogram was in July 2023 with LVEF 03%, grade 3 diastolic  dysfunction severely dilated left atrium, mild to moderate dilation right atrium.  Moderate to severe and increased from previous mitral valve regurgitation.  Mild to moderate tricuspid regurgitation.  Stable prostatic aortic valve without regurgitation.    PLAN:       Ordered acute hepatitis serologies, ANA, AMA, smooth muscle abs, ANA, INR  Follow LFTs.  Check INR in morning  Await today's ordered abdominal ultrasound.   Azucena Freed  03/23/2022, 12:42 PM Phone [phone]  -----------------------------------------------------------------------------  I have taken a history, reviewed the chart and examined the patient. I performed a substantive portion of this encounter, including complete performance of at least one of the key components, in conjunction with the APP. I agree with the APP's note, impression and recommendations  88 year old female with history of coronary artery disease status post CABG, aortic stenosis status post bioprosthetic AVR and CHF with EF of 35% in July admitted to [facility] with weakness and lethargy, found to have severe hyponatremia, transferred to [facility] for further evaluation and management of refractory hyponatremia.   She has no known history of liver disease, and her liver enzymes were normal on presentation to the hospital, but increased slightly the day after admission and continued to be mildly elevated for the next few days.  They were not checked for about a week, but were significantly more elevated when checked again today, with ALT 900, alk phos 260, T. bili normal at 1.1.  Her INR is elevated, but given her poor nutritional status, suspect this may be more related to vitamin K deficiency rather than solely hepatic dysfunction.  Her bilirubin is completely normal which is reassuring, but may increase as her hepatocellular injury evolves.  I suspect that the patient's elevated liver enzymes are not from a primary liver pathology.  Given the  patient's heart failure, she is at risk for congestive hepatopathy as well as shock liver.  Drug-induced liver injury also possible.  We will rule out usual causes of liver disease (viral, autoimmune) and get ultrasound with Dopplers to exclude thrombosis and assess for evidence of chronic liver disease. No specific therapy recommended other than optimizing hemodynamics and volume status.  Recommend subcutaneous vitamin K for 3 days to see if this improves her INR. Trend liver enzymes and INR daily.  Pecola Haxton E. Candis Schatz, MD [facility]

## 2022-03-23 NOTE — Progress Notes (Signed)
Nephrology Progress Note  Patient ID: Erin Herman, female   DOB: 04-28-1927, 88 y.o.   MRN: [id number]   Subjective: patient was transferred from [facility] due to family request.  Spoke with renal covering [employer] and plan had been to hold diuretics for now and reassess.  She was also seen by GI earlier this afternoon - their note is pending.  Her daughter is at bedside.  She states that her mother has had nausea and is not eating.  She states that her mother has had a lot of anxiety and she is concerned about an alternative to zoloft - I let her know that her primary team would be managing that.    Review of systems:  Some nausea; not eating  Some shortness of breath      O:BP 106/84 (BP Location: Left Arm)   Pulse 78   Temp 98.1 F (36.7 C) (Oral)   Resp 18   Ht 5\' 4"  (1.626 m)   Wt 68.3 kg   LMP  (LMP Unknown)   SpO2 99%   BMI 25.85 kg/m   Intake/Output Summary (Last 24 hours) at 03/23/2022 1525 Last data filed at 03/23/2022 1100 Gross per 24 hour  Intake 600 ml  Output 500 ml  Net 100 ml   Intake/Output: I/O last 3 completed shifts: In: 1200 [P.O.:1200] Out: 950 [Urine:950]  Intake/Output this shift:  Total I/O In: 120 [P.O.:120] Out: -  Weight change: -0.8 kg  General elderly female in bed in no acute distress HEENT normocephalic atraumatic extraocular movements intact sclera anicteric Neck supple trachea midline Lungs clear to auscultation bilaterally normal work of breathing at rest  Heart S1S2 no rub Abdomen soft nontender nondistended Extremities 1+ edema appreciated  Psych normal mood and affect Neuro awake and alert and oriented x 3 provides hx and follows commands   Recent Labs  Lab 03/17/22 0348 03/18/22 0441 03/19/22 0438 03/20/22 0505 03/21/22 0700 03/22/22 0529 03/22/22 0846 03/22/22 1225 03/22/22 1549 03/22/22 2004 03/23/22 0023 03/23/22 0411 03/23/22 0809  NA 129* 134* 134* 129* 128* 123* 124* 125* 123* 122* 124* 125*  123*  K 4.1 4.0 4.1 4.9 4.4 4.6  --   --   --   --   --  4.6  --   CL 93* 97* 94* 90* 89* 89*  --   --   --   --   --  88*  --   CO2 27 27 28 23 24 23   --   --   --   --   --  25  --   GLUCOSE 140* 118* 132* 145* 120* 117*  --   --   --   --   --  138*  --   BUN 27* 27* 31* 43* 53* 57*  --   --   --   --   --  57*  --   CREATININE 1.05* 1.11* 1.17* 1.36* 1.59* 1.56*  --   --   --   --   --  1.43*  --   ALBUMIN 3.5  --   --   --   --   --   --   --   --   --   --  3.4*  --   CALCIUM 8.6* 8.8* 9.1 9.3 8.7* 8.2*  --   --   --   --   --  8.6*  --   PHOS 3.9  --   --   --   --   --   --   --   --   --   --   --   --  AST  --   --   --   --   --   --   --   --   --   --   --  752*  --   ALT  --   --   --   --   --   --   --   --   --   --   --  892*  --    Liver Function Tests: Recent Labs  Lab 03/17/22 0348 03/23/22 0411  AST  --  752*  ALT  --  892*  ALKPHOS  --  260*  BILITOT  --  1.1  PROT  --  6.1*  ALBUMIN 3.5 3.4*   No results for input(s): "LIPASE", "AMYLASE" in the last 168 hours. No results for input(s): "AMMONIA" in the last 168 hours. CBC: Recent Labs  Lab 03/23/22 1037  WBC 8.1  HGB 11.3*  HCT 33.4*  MCV 94.4  PLT 224   Cardiac Enzymes: No results for input(s): "CKTOTAL", "CKMB", "CKMBINDEX", "TROPONINI" in the last 168 hours. CBG: Recent Labs  Lab 03/22/22 1118 03/22/22 1709 03/22/22 2024 03/23/22 0722 03/23/22 1223  GLUCAP 167* 142* 144* 129* 233*    Iron Studies: No results for input(s): "IRON", "TIBC", "TRANSFERRIN", "FERRITIN" in the last 72 hours. Studies/Results: No results found.  aspirin EC  81 mg Oral Daily   enoxaparin (LOVENOX) injection  30 mg Subcutaneous Q24H   famotidine  20 mg Oral Daily   feeding supplement (GLUCERNA SHAKE)  237 mL Oral TID BM   insulin aspart  0-9 Units Subcutaneous TID WC   levothyroxine  100 mcg Oral Q0600   melatonin  9 mg Oral QHS   metoprolol succinate  12.5 mg Oral Daily    BMET    Component Value  Date/Time   NA 123 (L) 03/23/2022 0809   K 4.6 03/23/2022 0411   CL 88 (L) 03/23/2022 0411   CO2 25 03/23/2022 0411   GLUCOSE 138 (H) 03/23/2022 0411   BUN 57 (H) 03/23/2022 0411   CREATININE 1.43 (H) 03/23/2022 0411   CALCIUM 8.6 (L) 03/23/2022 0411   GFRNONAA 34 (L) 03/23/2022 0411   GFRAA 59 (L) 11/07/2018 0620   CBC    Component Value Date/Time   WBC 8.1 03/23/2022 1037   RBC 3.54 (L) 03/23/2022 1037   HGB 11.3 (L) 03/23/2022 1037   HCT 33.4 (L) 03/23/2022 1037   PLT 224 03/23/2022 1037   MCV 94.4 03/23/2022 1037   MCH 31.9 03/23/2022 1037   MCHC 33.8 03/23/2022 1037   RDW 14.0 03/23/2022 1037   LYMPHSABS 1.1 03/10/2022 1430   MONOABS 0.6 03/10/2022 1430   EOSABS 0.0 03/10/2022 1430   BASOSABS 0.0 03/10/2022 1430     Assessment/Plan:  Hyponatremia - initially felt to be due to volume depletion (poor po intake with spironolactone and lasix).  Sodium was 119 on admission.  Uosm 144, Sosm 257, UNa <10 on 03/10/22.  She was given tolvaptan 15 mg on 03/14/22 with significant diuresis and improvement of sodium.  Zoloft stopped and restarted IV lasix due to edema.   Defer lasix  NS at 75 ml/hr x 12 hours May need 3% if refractory  Remain off of zoloft Defer tolvaptan given liver function  Update BMP now - will follow labs AKI/CKD stage III - possibly due to hypotension and diuretics.  Hold lasix and follow. Acute on chronic combined systolic and diastolic CHF - holding diuretics for now as  above DM type 2 - per primary Hypotension - per daughter this is chronic. Transaminitis - per primary team and see GI is consulted.  Will avoid tolvaptan   Estanislado Emms, MD 3:59 PM 03/23/2022

## 2022-03-23 NOTE — Progress Notes (Signed)
Pt c/o sacral pain. Pt was turned and repositioned. Tylenol administered per request.Kimon Loewen Gerrianne Scale

## 2022-03-24 DIAGNOSIS — Z7189 Other specified counseling: Secondary | ICD-10-CM | POA: Diagnosis not present

## 2022-03-24 DIAGNOSIS — E43 Unspecified severe protein-calorie malnutrition: Secondary | ICD-10-CM | POA: Diagnosis not present

## 2022-03-24 DIAGNOSIS — R627 Adult failure to thrive: Secondary | ICD-10-CM

## 2022-03-24 DIAGNOSIS — R7401 Elevation of levels of liver transaminase levels: Secondary | ICD-10-CM

## 2022-03-24 DIAGNOSIS — E871 Hypo-osmolality and hyponatremia: Secondary | ICD-10-CM | POA: Diagnosis not present

## 2022-03-24 DIAGNOSIS — I5042 Chronic combined systolic (congestive) and diastolic (congestive) heart failure: Secondary | ICD-10-CM | POA: Diagnosis not present

## 2022-03-24 DIAGNOSIS — K7460 Unspecified cirrhosis of liver: Secondary | ICD-10-CM | POA: Diagnosis not present

## 2022-03-24 DIAGNOSIS — R9431 Abnormal electrocardiogram [ECG] [EKG]: Secondary | ICD-10-CM | POA: Diagnosis not present

## 2022-03-24 DIAGNOSIS — I10 Essential (primary) hypertension: Secondary | ICD-10-CM | POA: Diagnosis not present

## 2022-03-24 DIAGNOSIS — I2510 Atherosclerotic heart disease of native coronary artery without angina pectoris: Secondary | ICD-10-CM

## 2022-03-24 LAB — CBC WITH DIFFERENTIAL/PLATELET
Abs Immature Granulocytes: 0.03 10*3/uL (ref 0.00–0.07)
Basophils Absolute: 0 10*3/uL (ref 0.0–0.1)
Basophils Relative: 0 %
Eosinophils Absolute: 0.2 10*3/uL (ref 0.0–0.5)
Eosinophils Relative: 3 %
HCT: 31.3 % — ABNORMAL LOW (ref 36.0–46.0)
Hemoglobin: 10.9 g/dL — ABNORMAL LOW (ref 12.0–15.0)
Immature Granulocytes: 0 %
Lymphocytes Relative: 23 %
Lymphs Abs: 1.5 10*3/uL (ref 0.7–4.0)
MCH: 32.8 pg (ref 26.0–34.0)
MCHC: 34.8 g/dL (ref 30.0–36.0)
MCV: 94.3 fL (ref 80.0–100.0)
Monocytes Absolute: 0.8 10*3/uL (ref 0.1–1.0)
Monocytes Relative: 11 %
Neutro Abs: 4.2 10*3/uL (ref 1.7–7.7)
Neutrophils Relative %: 63 %
Platelets: 200 10*3/uL (ref 150–400)
RBC: 3.32 MIL/uL — ABNORMAL LOW (ref 3.87–5.11)
RDW: 14.2 % (ref 11.5–15.5)
WBC: 6.7 10*3/uL (ref 4.0–10.5)
nRBC: 0 % (ref 0.0–0.2)

## 2022-03-24 LAB — GLUCOSE, CAPILLARY
Glucose-Capillary: 95 mg/dL (ref 70–99)
Glucose-Capillary: 177 mg/dL — ABNORMAL HIGH (ref 70–99)
Glucose-Capillary: 158 mg/dL — ABNORMAL HIGH (ref 70–99)

## 2022-03-24 LAB — COMPREHENSIVE METABOLIC PANEL
ALT: 632 U/L — ABNORMAL HIGH (ref 0–44)
AST: 431 U/L — ABNORMAL HIGH (ref 15–41)
Albumin: 2.9 g/dL — ABNORMAL LOW (ref 3.5–5.0)
Alkaline Phosphatase: 199 U/L — ABNORMAL HIGH (ref 38–126)
Anion gap: 9 (ref 5–15)
BUN: 47 mg/dL — ABNORMAL HIGH (ref 8–23)
CO2: 26 mmol/L (ref 22–32)
Calcium: 8.1 mg/dL — ABNORMAL LOW (ref 8.9–10.3)
Chloride: 91 mmol/L — ABNORMAL LOW (ref 98–111)
Creatinine, Ser: 1.2 mg/dL — ABNORMAL HIGH (ref 0.44–1.00)
GFR, Estimated: 42 mL/min — ABNORMAL LOW (ref >60)
Glucose, Bld: 107 mg/dL — ABNORMAL HIGH (ref 70–99)
Potassium: 4 mmol/L (ref 3.5–5.1)
Sodium: 126 mmol/L — ABNORMAL LOW (ref 135–145)
Total Bilirubin: 1.2 mg/dL (ref 0.3–1.2)
Total Protein: 5.3 g/dL — ABNORMAL LOW (ref 6.5–8.1)

## 2022-03-24 LAB — BRAIN NATRIURETIC PEPTIDE: B Natriuretic Peptide: 1013 pg/mL — ABNORMAL HIGH (ref 0.0–100.0)

## 2022-03-24 LAB — MAGNESIUM: Magnesium: 2 mg/dL (ref 1.7–2.4)

## 2022-03-24 LAB — HEPATITIS PANEL, ACUTE
HCV Ab: NONREACTIVE
Hep A IgM: NONREACTIVE
Hep B C IgM: NONREACTIVE
Hepatitis B Surface Ag: NONREACTIVE

## 2022-03-24 MED ORDER — FUROSEMIDE 10 MG/ML IJ SOLN
20.0000 mg | Freq: Once | INTRAMUSCULAR | Status: AC
  Administered 2022-03-24: 20 mg via INTRAVENOUS
  Filled 2022-03-24: qty 2

## 2022-03-24 MED ORDER — ADULT MULTIVITAMIN W/MINERALS CH
1.0000 | ORAL_TABLET | Freq: Every day | ORAL | Status: DC
  Administered 2022-03-24 – 2022-03-30 (×7): 1 via ORAL
  Filled 2022-03-24 (×7): qty 1

## 2022-03-24 MED ORDER — GUAIFENESIN 100 MG/5ML PO LIQD
5.0000 mL | ORAL | Status: DC | PRN
  Administered 2022-03-28: 5 mL via ORAL
  Filled 2022-03-24: qty 15

## 2022-03-24 MED ORDER — ACETAMINOPHEN 325 MG PO TABS
650.0000 mg | ORAL_TABLET | Freq: Three times a day (TID) | ORAL | Status: DC | PRN
  Administered 2022-03-24 – 2022-03-27 (×6): 650 mg via ORAL
  Filled 2022-03-24 (×7): qty 2

## 2022-03-24 MED ORDER — ACETAMINOPHEN 650 MG RE SUPP: 650.0000 mg | Freq: Three times a day (TID) | RECTAL | Status: DC | PRN

## 2022-03-24 MED ORDER — ENSURE ENLIVE PO LIQD
237.0000 mL | Freq: Three times a day (TID) | ORAL | Status: DC
  Administered 2022-03-24 – 2022-03-30 (×14): 237 mL via ORAL
  Filled 2022-03-24 (×4): qty 237

## 2022-03-24 MED ORDER — FENTANYL CITRATE PF 50 MCG/ML IJ SOSY: 12.5000 ug | PREFILLED_SYRINGE | INTRAMUSCULAR | Status: DC | PRN

## 2022-03-24 NOTE — Progress Notes (Signed)
Nephrology Progress Note  Patient ID: Erin Herman, female   DOB: 1927/09/20, 88 y.o.   MRN: [id number]   Subjective: patient was just recently taken off of zoloft.  (Last dose was 11/27).  She had an ultrasound which demonstrated cirrhotic morphology of the liver. Trace ascites.  Strict ins/outs do not appear available historically - she had 600 ml charted UOP the day of her transfer. Note there had also been an order in for routine ins/outs so I have discontinued that.  Her sodium has slowly improved to 126 this am and creatinine has improved.  We gave her NS at 75 ml/hr x 12 hours overnight.  She has had a cough and her daughter is worried about her having fluid overload.  Review of systems:  Nausea seems better but not eating great per family Shortness of breath and cough  Patient's daughter has noted confusion - pt does not comment on this      O:BP (!) 94/54 (BP Location: Left Arm)   Pulse (!) 55   Temp 97.7 F (36.5 C) (Oral)   Resp 15   Ht 5\' 4"  (1.626 m)   Wt 68.2 kg   LMP  (LMP Unknown)   SpO2 95%   BMI 25.81 kg/m   Intake/Output Summary (Last 24 hours) at 03/24/2022 0849 Last data filed at 03/23/2022 1945 Gross per 24 hour  Intake 129.71 ml  Output 600 ml  Net -470.29 ml   Intake/Output: I/O last 3 completed shifts: In: 369.7 [P.O.:360; I.V.:1.4; IV Piggyback:8.3] Out: 1100 [Urine:1100]  Intake/Output this shift:  No intake/output data recorded. Weight change: -0.1 kg  General elderly female in bed in no acute distress   HEENT normocephalic atraumatic extraocular movements intact sclera anicteric Neck supple trachea midline Lungs reduced to auscultation bilaterally normal work of breathing at rest on room air Heart S1S2 no rub Abdomen soft nontender nondistended Extremities 1+ edema appreciated  Psych normal mood and affect Neuro awake and alert and oriented x 3 provides hx and follows commands   Recent Labs  Lab 03/19/22 0438 03/20/22 0505  03/21/22 0700 03/22/22 0529 03/22/22 0846 03/23/22 0023 03/23/22 0411 03/23/22 0809 03/23/22 1213 03/23/22 1633 03/23/22 2033 03/24/22 0542  NA 134* 129* 128* 123*   < > 124* 125* 123* 124* 124* 125* 126*  K 4.1 4.9 4.4 4.6  --   --  4.6  --   --  4.4  --  4.0  CL 94* 90* 89* 89*  --   --  88*  --   --  86*  --  91*  CO2 28 23 24 23   --   --  25  --   --  25  --  26  GLUCOSE 132* 145* 120* 117*  --   --  138*  --   --  111*  --  107*  BUN 31* 43* 53* 57*  --   --  57*  --   --  54*  --  47*  CREATININE 1.17* 1.36* 1.59* 1.56*  --   --  1.43*  --   --  1.45*  --  1.20*  ALBUMIN  --   --   --   --   --   --  3.4*  --   --   --   --  2.9*  CALCIUM 9.1 9.3 8.7* 8.2*  --   --  8.6*  --   --  8.6*  --  8.1*  AST  --   --   --   --   --   --  752*  --   --   --   --  431*  ALT  --   --   --   --   --   --  892*  --   --   --   --  632*   < > = values in this interval not displayed.   Liver Function Tests: Recent Labs  Lab 03/23/22 0411 03/24/22 0542  AST 752* 431*  ALT 892* 632*  ALKPHOS 260* 199*  BILITOT 1.1 1.2  PROT 6.1* 5.3*  ALBUMIN 3.4* 2.9*   No results for input(s): "LIPASE", "AMYLASE" in the last 168 hours. No results for input(s): "AMMONIA" in the last 168 hours. CBC: Recent Labs  Lab 03/23/22 1037 03/24/22 0542  WBC 8.1 6.7  NEUTROABS  --  4.2  HGB 11.3* 10.9*  HCT 33.4* 31.3*  MCV 94.4 94.3  PLT 224 200   Cardiac Enzymes: No results for input(s): "CKTOTAL", "CKMB", "CKMBINDEX", "TROPONINI" in the last 168 hours. CBG: Recent Labs  Lab 03/23/22 0722 03/23/22 1223 03/23/22 1656 03/23/22 1945 03/24/22 0805  GLUCAP 129* 233* 122* 150* 95    Iron Studies: No results for input(s): "IRON", "TIBC", "TRANSFERRIN", "FERRITIN" in the last 72 hours. Studies/Results: US Abdomen Limited RUQ (LIVER/GB)  Result Date: 03/23/2022 CLINICAL DATA:  [id number] Transaminitis [id number] EXAM: ULTRASOUND ABDOMEN LIMITED RIGHT UPPER QUADRANT COMPARISON:  Chest x-ray 03/13/2022  FINDINGS: Gallbladder: Status post cholecystectomy. No sonographic Murphy sign noted by sonographer. Common bile duct: Diameter: 3 mm. Liver: Nodular hepatic contour. No focal lesion identified. Within normal limits in parenchymal echogenicity. Portal vein is patent on color Doppler imaging with normal direction of blood flow towards the liver. Other: Trace simple free fluid, at least trace to small volume right pleural effusion. IMPRESSION: 1. Cirrhotic morphology of the liver. No focal liver lesions identified by ultrasound. Please note that liver protocol enhanced MR and CT are the most sensitive tests for the screening detection of hepatocellular carcinoma in the high risk setting of cirrhosis. 2. Trace simple free fluid ascites. 3. At least trace to small volume right pleural effusion. 4. Status post cholecystectomy. Electronically Signed   By: Tish Frederickson M.D.   On: 03/23/2022 15:24    aspirin EC  81 mg Oral Daily   enoxaparin (LOVENOX) injection  30 mg Subcutaneous Q24H   famotidine  20 mg Oral Daily   feeding supplement (GLUCERNA SHAKE)  237 mL Oral TID BM   insulin aspart  0-9 Units Subcutaneous TID WC   levothyroxine  100 mcg Oral Q0600   melatonin  9 mg Oral QHS   metoprolol succinate  12.5 mg Oral Daily    BMET    Component Value Date/Time   NA 126 (L) 03/24/2022 0542   K 4.0 03/24/2022 0542   CL 91 (L) 03/24/2022 0542   CO2 26 03/24/2022 0542   GLUCOSE 107 (H) 03/24/2022 0542   BUN 47 (H) 03/24/2022 0542   CREATININE 1.20 (H) 03/24/2022 0542   CALCIUM 8.1 (L) 03/24/2022 0542   GFRNONAA 42 (L) 03/24/2022 0542   GFRAA 59 (L) 11/07/2018 0620   CBC    Component Value Date/Time   WBC 6.7 03/24/2022 0542   RBC 3.32 (L) 03/24/2022 0542   HGB 10.9 (L) 03/24/2022 0542   HCT 31.3 (L) 03/24/2022 0542   PLT 200 03/24/2022 0542   MCV 94.3 03/24/2022 0542   MCH 32.8 03/24/2022 0542   MCHC 34.8 03/24/2022 0542   RDW 14.2 03/24/2022 0542  LYMPHSABS 1.5 03/24/2022 0542    MONOABS 0.8 03/24/2022 0542   EOSABS 0.2 03/24/2022 0542   BASOSABS 0.0 03/24/2022 0542     Assessment/Plan:  Hyponatremia - initially felt to be due to volume depletion (poor po intake with spironolactone and lasix).  Sodium was 119 on admission.  Uosm 144, Sosm 257, UNa <10 on 03/10/22.  She was given tolvaptan 15 mg on 03/14/22 with significant diuresis and improvement of sodium.  Zoloft stopped and restarted IV lasix due to edema.   Lasix 20 mg IV once - she is on room air for me but has a wet cough Remain off of zoloft  Note new diagnosis of cirrhosis Defer tolvaptan given liver function  CMP is ordered for am AKI/CKD stage III - possibly due to hypotension and diuretics.   Improving with gentle hydration.  Hold lasix and follow. Acute on chronic combined systolic and diastolic CHF - holding diuretics for now as above DM type 2 - per primary Hypotension - per daughter this is chronic. Transaminitis - improving. per primary team and GI.  Will avoid tolvaptan  Cirrhosis - per imaging, has cirrhotic morphology of the liver.  Appreciate GI  Claudia Desanctis, MD 9:06 AM 03/24/2022

## 2022-03-24 NOTE — TOC Progression Note (Signed)
Transition of Care [facility]) - Progression Note    Patient Details  Name: Erin Herman MRN: [id number] Date of Birth: May 02, 1927  [facility] CM/SW [place], RN Phone Number: 03/24/2022, 10:08 AM  Clinical Narrative:    CM met with the patient at the bedside to discuss transitions of care needs.  The patient lives at home alone.  The daughter is currently at the bedside and states that the patient has been admitted to the hospital for the past 15 days and is weak and will likely need SNF placement in [place].  The patient's daughter did not have a preference for [facility] rehab versus [place].  CM will continue to follow the patient for SNF placement needs.   Expected Discharge Plan: [place] Barriers to Discharge: Continued Medical Work up  Expected Discharge Plan and Services Expected Discharge Plan: [place] In-house Referral: Clinical Social Work   Post Acute Care Choice: [place] Living arrangements for the past 2 months: [place]: RN, PT [facility] Agency: [place] Date [place]: 03/12/22   Representative spoke with at [place]: [place] ([place]) Interventions    Readmission Risk Interventions     No data to display

## 2022-03-24 NOTE — Evaluation (Signed)
Occupational Therapy Evaluation Patient Details Name: Erin Herman MRN: [id number] DOB: [birth year] Today's Date: 03/24/2022   History of Present Illness Erin Herman is a 88 y.o. female with medical history significant for systolic and diastolic CHF, CABG, left bundle branch block, diabetes mellitus, hypertension. Patient presented to the ED with complaints of nausea ongoing for about 5 days now. 1 episode of vomiting at onset of nausea. Over the past 5 days, reports mild confusion over the past 5 days, that patient is taking longer to respond to questions and has been having some gait abnormality.   Clinical Impression   Prior to this admission, patient living alone with family checking on her intermittently, and able to compete ADLs independently with extra time. ([employer] was delivering her meals, however daughter reports she has been eating less and less). Currently, patient requiring significant extra time to arouse, and need for increased assist in comparison to PT session yesterday. Patient mod A for ADLs and transfers, with significant posterior lean in standing. OT recommending SNF placement for rehab to assist in returning to prior level of function. OT will continue to follow.       Recommendations for follow up therapy are one component of a multi-disciplinary discharge planning process, led by the attending physician.  Recommendations may be updated based on patient status, additional functional criteria and insurance authorization.   Follow Up Recommendations  Skilled nursing-short term rehab (<3 hours/day)     Assistance Recommended at Discharge Frequent or constant Supervision/Assistance  Patient can return home with the following A lot of help with walking and/or transfers;A lot of help with bathing/dressing/bathroom;Assistance with cooking/housework;Direct supervision/assist for medications management;Direct supervision/assist for financial management;Assist  for transportation;Help with stairs or ramp for entrance    Functional Status Assessment  Patient has had a recent decline in their functional status and demonstrates the ability to make significant improvements in function in a reasonable and predictable amount of time.  Equipment Recommendations  None recommended by OT (Patient has DME needed)    Recommendations for Other Services       Precautions / Restrictions Precautions Precautions: Fall Restrictions Weight Bearing Restrictions: No      Mobility Bed Mobility Overal bed mobility: Needs Assistance Bed Mobility: Supine to Sit     Supine to sit: Mod assist     General bed mobility comments: mod A for truncal management and to transition BLEs off of bed, patient also needing assist at hips with use of bed pad to transition fully into sitting    Transfers Overall transfer level: Needs assistance Equipment used: Rolling walker (2 wheels) Transfers: Sit to/from Stand, Bed to chair/wheelchair/BSC Sit to Stand: Mod assist Stand pivot transfers: Mod assist         General transfer comment: patient requiring cues for RW management, significant posterior lean in standing, max tactile and verbal cues required to not sit pre-emptively      Balance Overall balance assessment: Needs assistance Sitting-balance support: Bilateral upper extremity supported, Feet supported Sitting balance-Leahy Scale: Fair     Standing balance support: During functional activity, Reliant on assistive device for balance, Bilateral upper extremity supported Standing balance-Leahy Scale: Poor Standing balance comment: significant posterior lean                           ADL either performed or assessed with clinical judgement   ADL Overall ADL's : Needs assistance/impaired Eating/Feeding: Set up;Sitting   Grooming: Set up;Wash/dry hands;Wash/dry  face;Sitting   Upper Body Bathing: Minimal assistance;Sitting   Lower Body  Bathing: Moderate assistance;Sitting/lateral leans;Sit to/from stand   Upper Body Dressing : Minimal assistance;Sitting   Lower Body Dressing: Maximal assistance;Sitting/lateral leans;Sit to/from stand   Toilet Transfer: Minimal assistance;Rolling walker (2 wheels) Toilet Transfer Details (indicate cue type and reason): simulated with stand pivot to chair Toileting- Clothing Manipulation and Hygiene: Moderate assistance;Sitting/lateral lean;Sit to/from stand       Functional mobility during ADLs: Moderate assistance;Rolling walker (2 wheels);Cueing for sequencing;Cueing for safety General ADL Comments: Patient presenting with lethargy, decreased activity tolerance, and need for increased assist with all aspects of care     Vision Baseline Vision/History: 0 No visual deficits Ability to See in Adequate Light: 0 Adequate Patient Visual Report: No change from baseline       Perception     Praxis      Pertinent Vitals/Pain Pain Assessment Pain Assessment: No/denies pain     Hand Dominance Right   Extremity/Trunk Assessment Upper Extremity Assessment Upper Extremity Assessment: Generalized weakness   Lower Extremity Assessment Lower Extremity Assessment: Generalized weakness   Cervical / Trunk Assessment Cervical / Trunk Assessment: Kyphotic   Communication Communication Communication: No difficulties   Cognition Arousal/Alertness: Lethargic Behavior During Therapy: Flat affect Overall Cognitive Status: Within Functional Limits for tasks assessed                                 General Comments: patient with need for increased time to arouse and participate to date     General Comments       Exercises     Shoulder Instructions      Home Living Family/patient expects to be discharged to:: Private residence Living Arrangements: Alone Available Help at Discharge: Family;Available 24 hours/day Type of Home: House Home Access: Stairs to  enter [employer] of Steps: 3 Entrance Stairs-Rails: Can reach both;Left;Right Home Layout: One level     Bathroom Shower/Tub: Teacher, early years/pre: Standard Bathroom Accessibility: Yes   Home Equipment: Conservation officer, nature (2 wheels);Cane - single point;BSC/3in1;Shower seat;Grab bars - tub/shower;Rollator (4 wheels);Cane - quad   Additional Comments: Patient lives alone but daughter is retired and able to help out 24/7      Prior Functioning/Environment Prior Level of Function : Independent/Modified Independent             Mobility Comments: Sales executive, was using quad cane but has recently switched to RW, uses rollator for longer distances ADLs Comments: Independent, has cleaning lady to clean house        OT Problem List: Decreased strength;Decreased activity tolerance;Decreased range of motion;Impaired balance (sitting and/or standing);Decreased coordination;Decreased safety awareness;Decreased knowledge of use of DME or AE;Decreased knowledge of precautions;Increased edema      OT Treatment/Interventions: Self-care/ADL training;Therapeutic exercise;Energy conservation;DME and/or AE instruction;Manual therapy;Therapeutic activities;Patient/family education;Balance training    OT Goals(Current goals can be found in the care plan section) Acute Rehab OT Goals Patient Stated Goal: to get better OT Goal Formulation: With patient/family Time For Goal Achievement: 04/07/22 Potential to Achieve Goals: Fair  OT Frequency: Min 2X/week    Co-evaluation              AM-PAC OT "6 Clicks" Daily Activity     Outcome Measure Help from another person eating meals?: A Little Help from another person taking care of personal grooming?: A Little Help from another person toileting, which includes using toliet,  bedpan, or urinal?: A Lot Help from another person bathing (including washing, rinsing, drying)?: A Lot Help from another person to put on and  taking off regular upper body clothing?: A Little Help from another person to put on and taking off regular lower body clothing?: A Lot 6 Click Score: 15   End of Session Equipment Utilized During Treatment: Gait belt;Rolling walker (2 wheels) Nurse Communication: Other (comment);Mobility status (new pure wick and need for BSC in room)  Activity Tolerance: Patient limited by lethargy;Patient limited by fatigue Patient left: in chair;with call bell/phone within reach;with family/visitor present  OT Visit Diagnosis: Unsteadiness on feet (R26.81);Muscle weakness (generalized) (M62.81);Other abnormalities of gait and mobility (R26.89)                Time: 9563-8756 OT Time Calculation (min): 33 min Charges:  OT General Charges $OT Visit: 1 Visit OT Evaluation $OT Eval Moderate Complexity: 1 Mod OT Treatments $Self Care/Home Management : 8-22 mins  Pollyann Glen E. Rathana Viveros, OTR/L Acute Rehabilitation Services [phone]   Cherlyn Cushing 03/24/2022, 10:21 AM

## 2022-03-24 NOTE — Progress Notes (Signed)
PROGRESS NOTE    Erin Herman  EK:[id number] DOB: 1927/11/24 DOA: 03/10/2022 PCP: Monico Blitz, MD   Brief Narrative:  88 y.o. female with medical history significant for chronic systolic and diastolic CHF, CABG, left bundle branch block, diabetes mellitus, hypertension presented with ongoing nausea.  She was diagnosed with UTI, dehydration, hyponatremia and admitted to [facility].  She was transferred to [facility] for further management of ongoing hyponatremia on 03/23/2022 for continued nephrology follow-up.  GI was consulted for elevated LFTs.    Assessment & Plan:   Hyponatremia -Initial sodium was 119. -Nephrology following.  Sodium 126 today.  Received IV hydration on 03/15/2022 by nephrology.  IV Lasix has been ordered by nephrology today. -Repeat a.m. labs.  Chronic systolic and diastolic heart failure -Last echo in 10/2018 had shown EF of 35% with grade 3 diastolic dysfunction -Strict input and output.  Daily weights.  Fluid restriction.  Diuretic management as per nephrology.  LFT elevation New diagnosis of possible cirrhosis of liver -LFTs improving.  Monitor.  Right upper quadrant ultrasound showed possible cirrhosis of liver, which is a new diagnosis.  GI following.  Follow further recommendations.  CAD with history of CABG and stenting -Stable.  No chest pain reported.  Outpatient follow-up with cardiology.  Continue aspirin and beta-blocker  Hypothyroidism -Continue levothyroxine  Anemia of chronic disease -From chronic illnesses.  Hemoglobin stable.  Monitor intermittently  Diabetes mellitus type 2 with hyperglycemia -Metformin on hold.  Continue CBGs with SSI  Prolonged QT, chronic -Currently stable.  Essential hypertension -Blood pressure on the lower side.  Monitor.  Continue metoprolol.  Imdur on hold.  Goals of care Failure to thrive Poor oral intake -Patient has had extremely poor oral intake few weeks prior to presentation  along with failure to thrive.  Discussed with daughter at bedside and she agrees for CODE STATUS to be changed to DNR.  Consult palliative care for goals of care discussion   DVT prophylaxis: Lovenox Code Status: DNR.  Confirmed by daughter Family Communication: Daughter at bedside Disposition Plan: Status is: Inpatient Remains inpatient appropriate because: Out of severity of illness.    Consultants: Nephrology/GI  Procedures: None  Antimicrobials: None   Subjective: Patient seen and examined at bedside.  Awake, extremely slow to respond.  Daughter at bedside.  Oral intake remains poor.  No fever, vomiting, agitation reported.  Objective: Vitals:   03/23/22 1945 03/24/22 0420 03/24/22 0525 03/24/22 0800  BP: 97/63  109/62 (!) 94/54  Pulse: 83  (!) 45 (!) 55  Resp: 19  17 15   Temp: 97.7 F (36.5 C)  98.6 F (37 C) 97.7 F (36.5 C)  TempSrc: Oral  Oral Oral  SpO2: 96%  96% 95%  Weight:  68.2 kg    Height:        Intake/Output Summary (Last 24 hours) at 03/24/2022 0954 Last data filed at 03/23/2022 1945 Gross per 24 hour  Intake 129.71 ml  Output 600 ml  Net -470.29 ml   Filed Weights   03/22/22 0500 03/23/22 0504 03/24/22 0420  Weight: 69.1 kg 68.3 kg 68.2 kg    Examination:  General exam: Appears calm and comfortable.  On room air.  Chronically ill and deconditioned. Respiratory system: Bilateral decreased breath sounds at bases with some scattered crackles Cardiovascular system: S1 & S2 heard, mild intermittent  gastrointestinal system: Abdomen is distended, soft and nontender. Normal bowel sounds heard. Extremities: No cyanosis, clubbing; trace lower extremity edema Central nervous system: Awake, extremity  not respond.  Poor history.  No focal neurological deficits. Moving extremities Skin: No rashes, lesions or ulcers Psychiatry: Mostly flat affect.  Not agitated.    Data Reviewed: I have personally reviewed following labs and imaging  studies  CBC: Recent Labs  Lab 03/23/22 1037 03/24/22 0542  WBC 8.1 6.7  NEUTROABS  --  4.2  HGB 11.3* 10.9*  HCT 33.4* 31.3*  MCV 94.4 94.3  PLT 224 200   Basic Metabolic Panel: Recent Labs  Lab 03/21/22 0700 03/22/22 0529 03/22/22 0846 03/23/22 0411 03/23/22 0809 03/23/22 1037 03/23/22 1213 03/23/22 1633 03/23/22 2033 03/24/22 0542  NA 128* 123*   < > 125* 123*  --  124* 124* 125* 126*  K 4.4 4.6  --  4.6  --   --   --  4.4  --  4.0  CL 89* 89*  --  88*  --   --   --  86*  --  91*  CO2 24 23  --  25  --   --   --  25  --  26  GLUCOSE 120* 117*  --  138*  --   --   --  111*  --  107*  BUN 53* 57*  --  57*  --   --   --  54*  --  47*  CREATININE 1.59* 1.56*  --  1.43*  --   --   --  1.45*  --  1.20*  CALCIUM 8.7* 8.2*  --  8.6*  --   --   --  8.6*  --  8.1*  MG  --   --   --   --   --  1.9  --   --   --  2.0   < > = values in this interval not displayed.   GFR: Estimated Creatinine Clearance: 28.4 mL/min (A) (by C-G formula based on SCr of 1.2 mg/dL (H)). Liver Function Tests: Recent Labs  Lab 03/23/22 0411 03/24/22 0542  AST 752* 431*  ALT 892* 632*  ALKPHOS 260* 199*  BILITOT 1.1 1.2  PROT 6.1* 5.3*  ALBUMIN 3.4* 2.9*   No results for input(s): "LIPASE", "AMYLASE" in the last 168 hours. No results for input(s): "AMMONIA" in the last 168 hours. Coagulation Profile: Recent Labs  Lab 03/23/22 1037  INR 1.5*   Cardiac Enzymes: No results for input(s): "CKTOTAL", "CKMB", "CKMBINDEX", "TROPONINI" in the last 168 hours. BNP (last 3 results) No results for input(s): "PROBNP" in the last 8760 hours. HbA1C: No results for input(s): "HGBA1C" in the last 72 hours. CBG: Recent Labs  Lab 03/23/22 0722 03/23/22 1223 03/23/22 1656 03/23/22 1945 03/24/22 0805  GLUCAP 129* 233* 122* 150* 95   Lipid Profile: No results for input(s): "CHOL", "HDL", "LDLCALC", "TRIG", "CHOLHDL", "LDLDIRECT" in the last 72 hours. Thyroid Function Tests: No results for  input(s): "TSH", "T4TOTAL", "FREET4", "T3FREE", "THYROIDAB" in the last 72 hours. Anemia Panel: No results for input(s): "VITAMINB12", "FOLATE", "FERRITIN", "TIBC", "IRON", "RETICCTPCT" in the last 72 hours. Sepsis Labs: No results for input(s): "PROCALCITON", "LATICACIDVEN" in the last 168 hours.  No results found for this or any previous visit (from the past 240 hour(s)).       Radiology Studies: US Abdomen Limited RUQ (LIVER/GB)  Result Date: 03/23/2022 CLINICAL DATA:  [id number] Transaminitis [id number] EXAM: ULTRASOUND ABDOMEN LIMITED RIGHT UPPER QUADRANT COMPARISON:  Chest x-ray 03/13/2022 FINDINGS: Gallbladder: Status post cholecystectomy. No sonographic Murphy sign noted by sonographer. Common bile duct: Diameter:  3 mm. Liver: Nodular hepatic contour. No focal lesion identified. Within normal limits in parenchymal echogenicity. Portal vein is patent on color Doppler imaging with normal direction of blood flow towards the liver. Other: Trace simple free fluid, at least trace to small volume right pleural effusion. IMPRESSION: 1. Cirrhotic morphology of the liver. No focal liver lesions identified by ultrasound. Please note that liver protocol enhanced MR and CT are the most sensitive tests for the screening detection of hepatocellular carcinoma in the high risk setting of cirrhosis. 2. Trace simple free fluid ascites. 3. At least trace to small volume right pleural effusion. 4. Status post cholecystectomy. Electronically Signed   By: Iven Finn M.D.   On: 03/23/2022 15:24        Scheduled Meds:  aspirin EC  81 mg Oral Daily   enoxaparin (LOVENOX) injection  30 mg Subcutaneous Q24H   famotidine  20 mg Oral Daily   feeding supplement (GLUCERNA SHAKE)  237 mL Oral TID BM   furosemide  20 mg Intravenous Once   insulin aspart  0-9 Units Subcutaneous TID WC   levothyroxine  100 mcg Oral Q0600   melatonin  9 mg Oral QHS   metoprolol succinate  12.5 mg Oral Daily   Continuous  Infusions:  phytonadione (VITAMIN K) 5 mg in dextrose 5 % 50 mL IVPB 50 mL/hr at 03/23/22 1703          Aline August, MD [facility] 03/24/2022, 9:54 AM

## 2022-03-24 NOTE — Consult Note (Signed)
Consultation Note Date: 03/24/2022   Patient Name: Erin Herman  DOB: [birth year]  MRN: [id number]  Age / Sex: 88 y.o., female  PCP: Monico Blitz, MD Referring Physician: Aline August, MD  Reason for Consultation: Establishing goals of care  HPI/Patient Profile: 88 y.o. female  with past medical history of systolic and diastolic CHF, CABG, left bundle branch block, diabetes mellitus, hypertension  admitted on 03/10/2022 with hyponatremia, AKI, dehydration and UTI.   Patient was transferred to [facility] on 03/23/2022 for further management of ongoing hyponatremia.  Nephrology is following.  GI also following for elevated LFTs and new imaging is concerning for cirrhosis. PMT has been consulted to assist with goals of care conversation.  Clinical Assessment and Goals of Care:  I have reviewed medical records including EPIC notes, labs and imaging, assessed the patient and then met at the bedside with patient's daughter Erin Herman to discuss diagnosis prognosis, GOC, EOL wishes, disposition and options.  I introduced Palliative Medicine as specialized medical care for people living with serious illness. It focuses on providing relief from the symptoms and stress of a serious illness. The goal is to improve quality of life for both the patient and the family.  We discussed a brief life review of the patient and then focused on their current illness. I attempted to elicit values and goals of care important to the patient.    Medical History Review and Understanding:  Patient's daughter Erin Herman states she has a good understanding of the severity of patient's acute and chronic illnesses, which makes more sense after discussion with several specialists today.  We reviewed current recommendations for SNF for rehab.  Social History: Patient is widowed as of 02-06-22, when her husband died at age 8 while on  hospice.  Patient previously enjoyed working on her [employer] but has not been able to work on this for several years.  Functional and Nutritional State: Patient with several weeks of poor oral intake and overall failure to thrive.  Advance Directives: A detailed discussion regarding advanced directives was had.  Patient's daughter Erin Herman reports she has HCPOA/advanced directive documentation in her car and will bring up tomorrow for scanning into EMR.  Durable POA documents were reviewed and not applicable.  Code Status: Concepts specific to code status, artifical feeding and hydration, and rehospitalization were considered and discussed.  DNR confirmed.  Discussion: Created space and opportunity for patient and family's thoughts and feelings on her current illness.  Patient is smiling but not participating in the conversation.  Daughter states she likely does not understand much of what is going on, particularly in regards to her new diagnosis of cirrhosis.  Emotional support and therapeutic listening was provided as daughter shared her experience during the process of receiving this news.  She is not sure how much further patient would want to work this up or how much that would even provide benefit.  She understands it is likely due to her overall chronic comorbidities including heart failure.  After review of the difference between palliative care and hospice care, she is agreeable to to an outpatient palliative care referral.  She would likely be in favor of a transition to comfort care and hospice if patient were to decline further despite efforts at rehabilitation.  Her father wanted home hospice and she feels patient would likely want this as well.  A MOST form was introduced and reviewed in detail.  Encouraged completion prior to discharge to facility.  She would like to review  further tonight and meet again tomorrow morning to finalize MOST form.  PMT contact information was  provided.   The difference between aggressive medical intervention and comfort care was considered in light of the patient's goals of care. Hospice and Palliative Care services outpatient were explained.  Discussed the importance of continued conversation with family and the medical providers regarding overall plan of care and treatment options, ensuring decisions are within the context of the patient's values and GOCs.   Questions and concerns were addressed.  Hard Choices booklet left for review. The family was encouraged to call with questions or concerns.  PMT will continue to support holistically.   SUMMARY OF RECOMMENDATIONS   -Continue DNR -Continue current care, allow time for outcomes and determine if patient can increase oral intake on her own -Goal is for trial of rehab at SNF with outpatient palliative care follow-up; patient's daughter/HCPOA would be in favor of transition to hospice at home if she does not improve or declines further -Patient's daughter will bring in advance directive documentation tomorrow for EMR -MOST form introduced today, will meet again tomorrow to complete -Psychosocial and emotional support provided -PMT will continue to follow and support  Prognosis: Guarded, hospice appropriate if unable to increase oral intake  Discharge Planning: [place] for rehab with Palliative care service follow-up      Primary Diagnoses: Present on Admission:  Hyponatremia  Hypokalemia  Essential hypertension, benign  Depression  Prolonged QT interval  Combined systolic and diastolic congestive heart failure ([place])  CAD, NATIVE VESSEL  Physical Exam Vitals and nursing note reviewed.  Constitutional:      General: She is not in acute distress.    Interventions: Nasal cannula in place.     Comments: 2L  Cardiovascular:     Rate and Rhythm: Normal rate.  Pulmonary:     Effort: Pulmonary effort is normal. No respiratory distress.  Neurological:      Mental Status: She is alert.  Psychiatric:        Herman and Affect: Affect normal.        Behavior: Behavior normal.   Vital Signs: BP 111/65   Pulse 73   Temp 97.7 F (36.5 C) (Oral)   Resp 15   Ht _0  (1.626 m)   Wt 68.2 kg   LMP  (LMP Unknown)   SpO2 95%   BMI 25.81 kg/m  Pain Scale: 0-10 POSS *See Group Information*: 1-Acceptable,Awake and alert Pain Score: 4   SpO2: SpO2: 95 % O2 Device:SpO2: 95 % O2 Flow Rate: .O2 Flow Rate (L/min): 2 L/min  Palliative Assessment/Data: 20%    MDM: High   Fatime Biswell Johnnette Litter, PA-C  Palliative Medicine Team Team phone # [phone]  Thank you for allowing the Palliative Medicine Team to assist in the care of this patient. Please utilize secure chat with additional questions, if there is no response within 30 minutes please call the above phone number.  Palliative Medicine Team providers are available by phone from 7am to 7pm daily and can be reached through the team cell phone.  Should this patient require assistance outside of these hours, please call the patient's attending physician.

## 2022-03-24 NOTE — Progress Notes (Addendum)
Daily Rounding Note  03/24/2022, 12:37 PM  LOS: 14 days   SUBJECTIVE:   Chief complaint:  Elevated LFTs.      Sleepy and less responsive today.  No BM's for ~ 3 days  Pt not c/O abdominal pain or nausea.  Daughter noted that the patient choked a bit when drinking water yesterday but is not interested in formal speech-language swallow studies  OBJECTIVE:         Vital signs in last 24 hours:    Temp:  [96.1 F (35.6 C)-98.6 F (37 C)] 97.7 F (36.5 C) (11/29 0800) Pulse Rate:  [45-83] 73 (11/29 1011) Resp:  [15-19] 15 (11/29 0800) BP: (94-111)/(54-84) 111/65 (11/29 1011) SpO2:  [95 %-99 %] 95 % (11/29 0800) Weight:  [68.2 kg] 68.2 kg (11/29 0420) Last BM Date : 03/21/22 Filed Weights   03/22/22 0500 03/23/22 0504 03/24/22 0420  Weight: 69.1 kg 68.3 kg 68.2 kg   General: Comfortable, resting quietly.  Frail.  Awakens easily. Heart: RRR. Chest: No labored breathing or cough. Abdomen: Soft without tenderness.  No HSM, masses, bruits, hernias Extremities: No CCE.  Feet are warm Neuro/Psych: Answers questions appropriately.  No tremors and no asterixis.  Intake/Output from previous day: 11/28 0701 - 11/29 0700 In: 129.7 [P.O.:120; I.V.:1.4; IV Piggyback:8.3] Out: 600 [Urine:600]  Intake/Output this shift: Total I/O In: 250 [P.O.:250] Out: 450 [Urine:450]  Lab Results: Recent Labs    03/23/22 1037 03/24/22 0542  WBC 8.1 6.7  HGB 11.3* 10.9*  HCT 33.4* 31.3*  PLT 224 200   BMET Recent Labs    03/23/22 0411 03/23/22 0809 03/23/22 1633 03/23/22 2033 03/24/22 0542  NA 125*   < > 124* 125* 126*  K 4.6  --  4.4  --  4.0  CL 88*  --  86*  --  91*  CO2 25  --  25  --  26  GLUCOSE 138*  --  111*  --  107*  BUN 57*  --  54*  --  47*  CREATININE 1.43*  --  1.45*  --  1.20*  CALCIUM 8.6*  --  8.6*  --  8.1*   < > = values in this interval not displayed.   LFT Recent Labs    03/23/22 0411  03/24/22 0542  PROT 6.1* 5.3*  ALBUMIN 3.4* 2.9*  AST 752* 431*  ALT 892* 632*  ALKPHOS 260* 199*  BILITOT 1.1 1.2   PT/INR Recent Labs    03/23/22 1037  LABPROT 18.2*  INR 1.5*   Hepatitis Panel Recent Labs    03/24/22 0542  HEPBSAG NON REACTIVE  HCVAB NON REACTIVE  HEPAIGM NON REACTIVE  HEPBIGM NON REACTIVE    Studies/Results: US Abdomen Limited RUQ (LIVER/GB)  Result Date: 03/23/2022 CLINICAL DATA:  [id number] Transaminitis [id number] EXAM: ULTRASOUND ABDOMEN LIMITED RIGHT UPPER QUADRANT COMPARISON:  Chest x-ray 03/13/2022 FINDINGS: Gallbladder: Status post cholecystectomy. No sonographic Murphy sign noted by sonographer. Common bile duct: Diameter: 3 mm. Liver: Nodular hepatic contour. No focal lesion identified. Within normal limits in parenchymal echogenicity. Portal vein is patent on color Doppler imaging with normal direction of blood flow towards the liver. Other: Trace simple free fluid, at least trace to small volume right pleural effusion. IMPRESSION: 1. Cirrhotic morphology of the liver. No focal liver lesions identified by ultrasound. Please note that liver protocol enhanced MR and CT are the most sensitive tests for the screening detection of hepatocellular carcinoma in the  high risk setting of cirrhosis. 2. Trace simple free fluid ascites. 3. At least trace to small volume right pleural effusion. 4. Status post cholecystectomy. Electronically Signed   By: Tish Frederickson M.D.   On: 03/23/2022 15:24    Scheduled Meds:  aspirin EC  81 mg Oral Daily   enoxaparin (LOVENOX) injection  30 mg Subcutaneous Q24H   famotidine  20 mg Oral Daily   feeding supplement  237 mL Oral TID BM   insulin aspart  0-9 Units Subcutaneous TID WC   levothyroxine  100 mcg Oral Q0600   melatonin  9 mg Oral QHS   metoprolol succinate  12.5 mg Oral Daily   multivitamin with minerals  1 tablet Oral Daily   Continuous Infusions:  phytonadione (VITAMIN K) 5 mg in dextrose 5 % 50 mL IVPB 5 mg  (03/24/22 1011)   PRN Meds:.acetaminophen **OR** acetaminophen, ALPRAZolam, alum & mag hydroxide-simeth, fentaNYL (SUBLIMAZE) injection, levalbuterol, lidocaine, loperamide, mouth rinse, oxyCODONE, polyethylene glycol, promethazine    ASSESMENT:     Elevated LFTs.   Unremarkable liver, biliary system on CT.   Ultrasound shows cirrhotic liver, no suspicious lesions.  HCV, HBV surf Ag, HBV core IgM Hep A IgM are all negative.   Pndg: AMA, smooth muscle Ab, ANA Elevated Alkphos, transaminases improving but not resolved.  T bili normal.   Etiologies of cirrhosis could be NASH, cardiac.  She has no history of significant alcohol intake so that is not the etiology.  At her age of 7, determining the etiology of the cirrhosis is not that important.      Hyponatremia.   Na 126.      Coagulopathy.  INR 1.5.  Day 2/3 IV Vit K.  Platelets ok.      [place] anemia.      AKI, improved.    Somnolence.  This could be hepatic encephalopathy.  CHF.   PLAN   Discussed the findings of cirrhosis with the patient's daughter and son-in-law.  They are not interested in being aggressive in terms of ruling out hepatocellular carcinoma with further imaging.  Also in regards to the observed dysphagia, family prefers not to have patient undergo swallowing evaluation and are accepting of risks if she were to aspirate.  Check ammonia with next lab draw tomorrow morning.  If elevated we can add lactulose.   Jennye Moccasin  03/24/2022, 12:37 PM Phone [phone]  ------------------------------------------------------------------------------  I have taken a history, reviewed the chart and examined the patient. I performed a substantive portion of this encounter, including complete performance of at least one of the key components, in conjunction with the APP. I agree with the APP's note, impression and recommendations  Patient's liver enzymes improved some today. US showed cirrhotic appearing liver.  She does not  have findings of advanced cirrhosis or portal hypertension (splenomegaly, thrombocytopenia, varices on CT).  Trace ascites not necessarily secondary to portal hypertension in this patient with malnutrition/hypoalbuminemia and heart failure and likely not adequate for diagnostic paracentesis. In terms of risk factors, her liver disease is most likely secondary to her heart failure/hepatic congestion.  Labs pending for autoimmune hepatitis/PBC.  Given her advanced age and poor functional status, agree with not pursuing further invasive testing such as liver biopsy or variceal screening EGD.   It is unlikely her cirrhosis is playing a primary role in her hyponatremia given the absence of significant portal hypertension, but it may well be contributing. Her acutely elevated liver enzymes are still felt to be  most likely related to either congestive hepatopathy vs DILI and having chronic liver disease makes her more susceptible to these injuries. I suspect her liver enzymes will continue to downtrend.  In terms of her INR elevation, unclear how much is secondary to hepatic dysfunction vs malnutrition.  Will recheck after Vit K administration.  She is somewhat somnolent, and apparently has been intermittently per her son in law.  Hepatic encephalopathy could be a contributor, although she had no asterixis on my exam.  Ammonia pending, if elevated, consider trial of lactulose to see if mental status improves.  Cirrhosis, compensated - F/u ammonia level; start lactulose if elevated - Consider colloids if IV volume needed as opposed to crystalloids - F/u INR following Vit K administration - Autoimmune serologies pending, although there would be very limited role for treatment. - Defer variceal screening given advanced age - avoid NSAIDs; use reduced dose acetaminophen for pain if needed (<3 grams/day)  Acute liver injury - Suspect DILI vs congestive hepatopathy - Expect liver enzymes to slowly  improve - No further treatment needed.  Hyponatremia, not likely driven by cirrhosis - Defer management to nephrology - Agree with avoiding tolvaptan given liver disease  Rashmi Tallent E. Tomasa Rand, MD [facility]

## 2022-03-24 NOTE — Progress Notes (Signed)
Mobility Specialist Progress Note   03/24/22 1415  Mobility  Activity Ambulated with assistance in room  Level of Assistance Minimal assist, patient does 75% or more  Assistive Device Front wheel walker  Distance Ambulated (ft) 15 ft  Range of Motion/Exercises Active;All extremities  Activity Response Tolerated well   Patient received in supine and agreeable to participate. Required mod A for bed mobility and stood with min A. Ambulated min A to min guard with slow gait. Returned to bed without complaint or incident. Was left in supine with all needs met, call bell in reach.   [place] Monik Lins, BS EXP Mobility Specialist Please contact via SecureChat or Rehab office at [phone]

## 2022-03-24 NOTE — Progress Notes (Signed)
Initial Nutrition Assessment  DOCUMENTATION CODES:   Severe malnutrition in context of chronic illness  INTERVENTION:  Continue carb modified diet ordering assistance Add whole milk to all trays MVI with minerals daily Change Glucerna to Ensure Enlive po TID, each supplement provides 350 kcal and 20 grams of protein. Magic cup TID with meals, each supplement provides 290 kcal and 9 grams of protein  NUTRITION DIAGNOSIS:   Severe Malnutrition related to chronic illness (CHF, new dx cirrhosis) as evidenced by severe muscle depletion, severe fat depletion.  GOAL:   Patient will meet greater than or equal to 90% of their needs  MONITOR:   PO intake, Supplement acceptance, Labs, Weight trends  REASON FOR ASSESSMENT:   Malnutrition Screening Tool    ASSESSMENT:  Pt with hx of CHF, CAD s/p CABG, CKD, HTN, GERD, HLD, DM type 2, and dysphagia presented to ED with nausea and mild confusion. Pt admitted for hyponatremia.  11/28 - transferred to [facility] from [facility]  Pt resting in bedside chair at the time of assessment. Awake, but does not respond to most questions. Pt does report her bottom is sore - discussed with RN. Daughter at bedside provides the majority of history. States pt's intake has slowly been declining over the last several months but has been very poor since admission. Reports she has also lost ~10 lbs but has gained some back. Likely fluid as pt's lower half and abdomen have edema.   Will adjust supplements, enter food preferences, and add vitamin regimen to encourage adequate nutrition.  Pt newly dx with cirrhosis this hospitalization. Palliative care working with family.  Average Meal Intake: 11/17-11/28: 34% intake x 14 recorded meals  Nutritionally Relevant Medications: Scheduled Meds:  famotidine  20 mg Oral Daily   GLUCERNA SHAKE  237 mL Oral TID BM   furosemide  20 mg Intravenous Once   insulin aspart  0-9 Units Subcutaneous TID WC   Continuous  Infusions:  phytonadione (VITAMIN K) 5 mg in dextrose 5 % 50 mL IVPB 50 mL/hr at 03/23/22 1703   PRN Meds: alum & mag hydroxide-simeth, loperamide, polyethylene glycol, promethazine  Labs Reviewed: Na 126, chloride 91 BUN 47, creatinine 1.2 CBG ranges from 95-233 mg/dL over the last 24 hours HgbA1c 6.6% (11/16)  Lab Results  Component Value Date   ALT 632 (H) 03/24/2022   AST 431 (H) 03/24/2022   ALKPHOS 199 (H) 03/24/2022   BILITOT 1.2 03/24/2022    NUTRITION - FOCUSED PHYSICAL EXAM: Flowsheet Row Most Recent Value  Orbital Region Severe depletion  Upper Arm Region Severe depletion  Thoracic and Lumbar Region Severe depletion  Buccal Region Severe depletion  Temple Region Moderate depletion  Clavicle Bone Region Severe depletion  Clavicle and Acromion Bone Region Severe depletion  Scapular Bone Region Severe depletion  Dorsal Hand Severe depletion  Patellar Region Mild depletion  Anterior Thigh Region Mild depletion  Posterior Calf Region Mild depletion  Edema (RD Assessment) Moderate  [abdomen, BLE]  Hair Reviewed  Eyes Reviewed  Mouth Reviewed  Skin Reviewed  Nails Reviewed    Diet Order:   Diet Order             Diet Carb Modified Fluid consistency: Thin; Room service appropriate? Yes; Fluid restriction: 1500 mL Fluid  Diet effective now                   EDUCATION NEEDS:  Not appropriate for education at this time  Skin:  Skin Assessment: Reviewed RN  Assessment (redness to the bilateral buttocks)  Last BM:  11/28  Height:  Ht Readings from Last 1 Encounters:  03/10/22 5\' 4"  (1.626 m)    Weight:  Wt Readings from Last 1 Encounters:  03/24/22 68.2 kg    Ideal Body Weight:  54.5 kg  BMI:  Body mass index is 25.81 kg/m.  Estimated Nutritional Needs:  Kcal:  1400-1700 kcal/d Protein:  65-80g/d Fluid:  1.5-1.7L/d    03/26/22, RD, LDN Clinical Dietitian RD pager # available in [facility]  After hours/weekend pager # available in  [facility]

## 2022-03-25 DIAGNOSIS — R7401 Elevation of levels of liver transaminase levels: Secondary | ICD-10-CM | POA: Diagnosis not present

## 2022-03-25 DIAGNOSIS — E871 Hypo-osmolality and hyponatremia: Secondary | ICD-10-CM | POA: Diagnosis not present

## 2022-03-25 DIAGNOSIS — E43 Unspecified severe protein-calorie malnutrition: Secondary | ICD-10-CM

## 2022-03-25 DIAGNOSIS — I5042 Chronic combined systolic (congestive) and diastolic (congestive) heart failure: Secondary | ICD-10-CM | POA: Diagnosis not present

## 2022-03-25 DIAGNOSIS — K7460 Unspecified cirrhosis of liver: Secondary | ICD-10-CM | POA: Diagnosis not present

## 2022-03-25 DIAGNOSIS — I10 Essential (primary) hypertension: Secondary | ICD-10-CM | POA: Diagnosis not present

## 2022-03-25 LAB — AMMONIA: Ammonia: 35 umol/L (ref 9–35)

## 2022-03-25 LAB — CBC WITH DIFFERENTIAL/PLATELET
Abs Immature Granulocytes: 0.03 10*3/uL (ref 0.00–0.07)
Basophils Absolute: 0 10*3/uL (ref 0.0–0.1)
Basophils Relative: 0 %
Eosinophils Absolute: 0.1 10*3/uL (ref 0.0–0.5)
Eosinophils Relative: 2 %
HCT: 33 % — ABNORMAL LOW (ref 36.0–46.0)
Hemoglobin: 10.9 g/dL — ABNORMAL LOW (ref 12.0–15.0)
Immature Granulocytes: 0 %
Lymphocytes Relative: 16 %
Lymphs Abs: 1.1 10*3/uL (ref 0.7–4.0)
MCH: 32.1 pg (ref 26.0–34.0)
MCHC: 33 g/dL (ref 30.0–36.0)
MCV: 97.1 fL (ref 80.0–100.0)
Monocytes Absolute: 0.7 10*3/uL (ref 0.1–1.0)
Monocytes Relative: 11 %
Neutro Abs: 4.9 10*3/uL (ref 1.7–7.7)
Neutrophils Relative %: 71 %
Platelets: 205 10*3/uL (ref 150–400)
RBC: 3.4 MIL/uL — ABNORMAL LOW (ref 3.87–5.11)
RDW: 14.5 % (ref 11.5–15.5)
WBC: 6.9 10*3/uL (ref 4.0–10.5)
nRBC: 0 % (ref 0.0–0.2)

## 2022-03-25 LAB — COMPREHENSIVE METABOLIC PANEL
ALT: 560 U/L — ABNORMAL HIGH (ref 0–44)
AST: 331 U/L — ABNORMAL HIGH (ref 15–41)
Albumin: 3 g/dL — ABNORMAL LOW (ref 3.5–5.0)
Alkaline Phosphatase: 188 U/L — ABNORMAL HIGH (ref 38–126)
Anion gap: 10 (ref 5–15)
BUN: 39 mg/dL — ABNORMAL HIGH (ref 8–23)
CO2: 26 mmol/L (ref 22–32)
Calcium: 8.5 mg/dL — ABNORMAL LOW (ref 8.9–10.3)
Chloride: 93 mmol/L — ABNORMAL LOW (ref 98–111)
Creatinine, Ser: 1.03 mg/dL — ABNORMAL HIGH (ref 0.44–1.00)
GFR, Estimated: 51 mL/min — ABNORMAL LOW (ref >60)
Glucose, Bld: 137 mg/dL — ABNORMAL HIGH (ref 70–99)
Potassium: 4.5 mmol/L (ref 3.5–5.1)
Sodium: 129 mmol/L — ABNORMAL LOW (ref 135–145)
Total Bilirubin: 0.7 mg/dL (ref 0.3–1.2)
Total Protein: 5.6 g/dL — ABNORMAL LOW (ref 6.5–8.1)

## 2022-03-25 LAB — BRAIN NATRIURETIC PEPTIDE: B Natriuretic Peptide: 756 pg/mL — ABNORMAL HIGH (ref 0.0–100.0)

## 2022-03-25 LAB — GLUCOSE, CAPILLARY
Glucose-Capillary: 121 mg/dL — ABNORMAL HIGH (ref 70–99)
Glucose-Capillary: 180 mg/dL — ABNORMAL HIGH (ref 70–99)
Glucose-Capillary: 163 mg/dL — ABNORMAL HIGH (ref 70–99)
Glucose-Capillary: 140 mg/dL — ABNORMAL HIGH (ref 70–99)

## 2022-03-25 LAB — PROTIME-INR
INR: 1.2 (ref 0.8–1.2)
Prothrombin Time: 15.2 seconds (ref 11.4–15.2)

## 2022-03-25 LAB — ANTI-SMOOTH MUSCLE ANTIBODY, IGG: F-Actin IgG: 5 Units (ref 0–19)

## 2022-03-25 LAB — TROPONIN I (HIGH SENSITIVITY)
Troponin I (High Sensitivity): 14 ng/L (ref <18)
Troponin I (High Sensitivity): 17 ng/L (ref <18)

## 2022-03-25 LAB — ANA W/REFLEX IF POSITIVE: Anti Nuclear Antibody (ANA): NEGATIVE

## 2022-03-25 LAB — MITOCHONDRIAL ANTIBODIES: Mitochondrial M2 Ab, IgG: <20 Units (ref 0.0–20.0)

## 2022-03-25 LAB — MAGNESIUM: Magnesium: 2 mg/dL (ref 1.7–2.4)

## 2022-03-25 MED ORDER — FUROSEMIDE 10 MG/ML IJ SOLN
20.0000 mg | Freq: Once | INTRAMUSCULAR | Status: AC
  Administered 2022-03-25: 20 mg via INTRAVENOUS
  Filled 2022-03-25: qty 2

## 2022-03-25 MED ORDER — POLYETHYLENE GLYCOL 3350 17 G PO PACK
17.0000 g | PACK | Freq: Every day | ORAL | Status: DC
  Administered 2022-03-25 – 2022-03-26 (×2): 17 g via ORAL
  Filled 2022-03-25 (×4): qty 1

## 2022-03-25 MED ORDER — ALPRAZOLAM 0.5 MG PO TABS
0.5000 mg | ORAL_TABLET | Freq: Every day | ORAL | Status: DC
  Administered 2022-03-25 – 2022-03-26 (×2): 0.5 mg via ORAL
  Filled 2022-03-25 (×2): qty 1

## 2022-03-25 NOTE — Progress Notes (Signed)
PROGRESS NOTE    Erin Herman  QJJ:[id number] DOB: 07/31/1927 DOA: 03/10/2022 PCP: Kirstie Peri, MD   Brief Narrative:  88 y.o. female with medical history significant for chronic systolic and diastolic CHF, CABG, left bundle branch block, diabetes mellitus, hypertension presented with ongoing nausea.  She was diagnosed with UTI, dehydration, hyponatremia and admitted to [facility].  She was transferred to [facility] for further management of ongoing hyponatremia on 03/23/2022 for continued nephrology follow-up.  GI was consulted for elevated LFTs.    Assessment & Plan:   Hyponatremia -Initial sodium was 119. -Nephrology following.  Sodium 129 today.  Received IV hydration on 03/23/2022 by nephrology.  Patient received 1 dose of IV Lasix on 03/24/2022 as per nephrology. -Repeat a.m. labs.  Chronic systolic and diastolic heart failure -Last echo in 10/2018 had shown EF of 35% with grade 3 diastolic dysfunction -Strict input and output.  Daily weights.  Fluid restriction.  Diuretic management as per nephrology.  LFT elevation New diagnosis of possible cirrhosis of liver -LFTs improving.  Monitor.  Right upper quadrant ultrasound showed possible cirrhosis of liver, which is a new diagnosis.  GI following.  Follow further recommendations.  CAD with history of CABG and stenting -Stable.  No chest pain reported.  Outpatient follow-up with cardiology.  Continue aspirin and beta-blocker  Hypothyroidism -Continue levothyroxine  Anemia of chronic disease -From chronic illnesses.  Hemoglobin stable.  Monitor intermittently  Diabetes mellitus type 2 with hyperglycemia and hypoglycemia -Metformin on hold.  Continue CBGs with SSI  Prolonged QT, chronic -Currently stable.  Essential hypertension -Blood pressure on the lower side.  Monitor.  Continue metoprolol.  Imdur on hold.  Goals of care Failure to thrive Poor oral intake -Patient has had extremely poor oral  intake few weeks prior to presentation along with failure to thrive.  CODE STATUS has been changed to DNR after discussion with daughter on 03/24/2022.   -Palliative care following  DVT prophylaxis: Lovenox Code Status: DNR Family Communication: Daughter at bedside  Disposition Plan: Status is: Inpatient Remains inpatient appropriate because: of severity of illness.    Consultants: Nephrology/GI/palliative care  Procedures: None  Antimicrobials: None   Subjective: Patient seen and examined at bedside.  Still extremely slow to respond; poor historian; awake.  Daughter at bedside.  Oral intake slightly better this morning as per the daughter.  No agitation, fever, seizures or vomiting reported.  Patient was very anxious last night as per the daughter. Objective: Vitals:   03/24/22 1946 03/24/22 2319 03/25/22 0355 03/25/22 0447  BP: 109/70 139/76 113/62   Pulse: (!) 58 79 (!) 45   Resp: 15 18 16    Temp: 98.1 F (36.7 C) (!) 97.5 F (36.4 C) (!) 97.4 F (36.3 C)   TempSrc: Oral Oral Oral   SpO2: 96% 96% 91%   Weight:    67.5 kg  Height:        Intake/Output Summary (Last 24 hours) at 03/25/2022 0724 Last data filed at 03/25/2022 0630 Gross per 24 hour  Intake 761.72 ml  Output 850 ml  Net -88.28 ml    Filed Weights   03/23/22 0504 03/24/22 0420 03/25/22 0447  Weight: 68.3 kg 68.2 kg 67.5 kg    Examination:  General: Currently on room air.  No distress.  Looks chronically ill and deconditioned. ENT/neck: No thyromegaly.  JVD is not elevated  respiratory: Decreased breath sounds at bases bilaterally with some crackles; no wheezing  CVS: S1-S2 heard, mild intermittent bradycardia present  abdominal: Soft, nontender, slightly distended; no organomegaly, normal bowel sounds are heard Extremities: Bilateral lower extremity edema present; no clubbing CNS: Still extremely slow to respond; awake.  Poor historian.  No focal neurologic deficit.  Moves extremities Lymph: No  obvious lymphadenopathy Skin: No obvious ecchymosis/lesions  psych: Not agitated currently.  Extremely flat affect. musculoskeletal: No obvious joint swelling/deformity     Data Reviewed: I have personally reviewed following labs and imaging studies  CBC: Recent Labs  Lab 03/23/22 1037 03/24/22 0542 03/25/22 0228  WBC 8.1 6.7 6.9  NEUTROABS  --  4.2 4.9  HGB 11.3* 10.9* 10.9*  HCT 33.4* 31.3* 33.0*  MCV 94.4 94.3 97.1  PLT 224 200 205    Basic Metabolic Panel: Recent Labs  Lab 03/22/22 0529 03/22/22 0846 03/23/22 0411 03/23/22 0809 03/23/22 1037 03/23/22 1213 03/23/22 1633 03/23/22 2033 03/24/22 0542 03/25/22 0228  NA 123*   < > 125*   < >  --  124* 124* 125* 126* 129*  K 4.6  --  4.6  --   --   --  4.4  --  4.0 4.5  CL 89*  --  88*  --   --   --  86*  --  91* 93*  CO2 23  --  25  --   --   --  25  --  26 26  GLUCOSE 117*  --  138*  --   --   --  111*  --  107* 137*  BUN 57*  --  57*  --   --   --  54*  --  47* 39*  CREATININE 1.56*  --  1.43*  --   --   --  1.45*  --  1.20* 1.03*  CALCIUM 8.2*  --  8.6*  --   --   --  8.6*  --  8.1* 8.5*  MG  --   --   --   --  1.9  --   --   --  2.0 2.0   < > = values in this interval not displayed.    GFR: Estimated Creatinine Clearance: 32.9 mL/min (A) (by C-G formula based on SCr of 1.03 mg/dL (H)). Liver Function Tests: Recent Labs  Lab 03/23/22 0411 03/24/22 0542 03/25/22 0228  AST 752* 431* 331*  ALT 892* 632* 560*  ALKPHOS 260* 199* 188*  BILITOT 1.1 1.2 0.7  PROT 6.1* 5.3* 5.6*  ALBUMIN 3.4* 2.9* 3.0*    No results for input(s): "LIPASE", "AMYLASE" in the last 168 hours. Recent Labs  Lab 03/25/22 0228  AMMONIA 35   Coagulation Profile: Recent Labs  Lab 03/23/22 1037 03/25/22 0228  INR 1.5* 1.2    Cardiac Enzymes: No results for input(s): "CKTOTAL", "CKMB", "CKMBINDEX", "TROPONINI" in the last 168 hours. BNP (last 3 results) No results for input(s): "PROBNP" in the last 8760 hours. HbA1C: No  results for input(s): "HGBA1C" in the last 72 hours. CBG: Recent Labs  Lab 03/23/22 1656 03/23/22 1945 03/24/22 0805 03/24/22 1124 03/24/22 1706  GLUCAP 122* 150* 95 177* 158*    Lipid Profile: No results for input(s): "CHOL", "HDL", "LDLCALC", "TRIG", "CHOLHDL", "LDLDIRECT" in the last 72 hours. Thyroid Function Tests: No results for input(s): "TSH", "T4TOTAL", "FREET4", "T3FREE", "THYROIDAB" in the last 72 hours. Anemia Panel: No results for input(s): "VITAMINB12", "FOLATE", "FERRITIN", "TIBC", "IRON", "RETICCTPCT" in the last 72 hours. Sepsis Labs: No results for input(s): "PROCALCITON", "LATICACIDVEN" in the last 168 hours.  No results found  for this or any previous visit (from the past 240 hour(s)).       Radiology Studies: US Abdomen Limited RUQ (LIVER/GB)  Result Date: 03/23/2022 CLINICAL DATA:  [id number] Transaminitis [id number] EXAM: ULTRASOUND ABDOMEN LIMITED RIGHT UPPER QUADRANT COMPARISON:  Chest x-ray 03/13/2022 FINDINGS: Gallbladder: Status post cholecystectomy. No sonographic Murphy sign noted by sonographer. Common bile duct: Diameter: 3 mm. Liver: Nodular hepatic contour. No focal lesion identified. Within normal limits in parenchymal echogenicity. Portal vein is patent on color Doppler imaging with normal direction of blood flow towards the liver. Other: Trace simple free fluid, at least trace to small volume right pleural effusion. IMPRESSION: 1. Cirrhotic morphology of the liver. No focal liver lesions identified by ultrasound. Please note that liver protocol enhanced MR and CT are the most sensitive tests for the screening detection of hepatocellular carcinoma in the high risk setting of cirrhosis. 2. Trace simple free fluid ascites. 3. At least trace to small volume right pleural effusion. 4. Status post cholecystectomy. Electronically Signed   By: Tish Frederickson M.D.   On: 03/23/2022 15:24        Scheduled Meds:  aspirin EC  81 mg Oral Daily   enoxaparin  (LOVENOX) injection  30 mg Subcutaneous Q24H   famotidine  20 mg Oral Daily   feeding supplement  237 mL Oral TID BM   insulin aspart  0-9 Units Subcutaneous TID WC   levothyroxine  100 mcg Oral Q0600   melatonin  9 mg Oral QHS   metoprolol succinate  12.5 mg Oral Daily   multivitamin with minerals  1 tablet Oral Daily   Continuous Infusions:  phytonadione (VITAMIN K) 5 mg in dextrose 5 % 50 mL IVPB 5 mg (03/24/22 1011)          Glade Lloyd, MD [facility] 03/25/2022, 7:24 AM

## 2022-03-25 NOTE — Progress Notes (Signed)
Daily Progress Note   Patient Name: Erin Herman       Date: 03/25/2022 DOB: 03-18-1928  Age: 88 y.o. MRN#: [id number] Attending Physician: Glade Lloyd, MD Primary Care Physician: Kirstie Peri, MD Admit Date: 03/10/2022  Reason for Consultation/Follow-up: Establishing goals of care  Subjective: Medical records reviewed including progress notes, labs, imaging. Patient assessed at the bedside.  She is sitting up in bedside chair, eating her breakfast and requesting something to drink.  Discussed with patient's daughter Erin Herman who is visiting at the bedside.  Patient's daughter reports she had a difficult night last night, with worsening anxiety since discontinuation of Zoloft.  This also happened again this morning.  She feels that Xanax has been very helpful, but she is concerned that patient will not receive this on an as-needed basis if she goes to a SNF.  Patient often will deny experiencing any anxiety until this is severely uncontrolled.  We discussed scheduling at bedtime and the importance of outpatient follow-up.  Erin Herman is also still weighing their options and uncertain if she will proceed with SNF at all.  We reviewed the option of home health and outpatient palliative care referral alternatively.  Erin Herman is in contact with her father's previous sitters to arrange for possible assistance and supervision if/when her mother returns home.  She feels this would likely be a better option for patient to avoid worsening confusion and anxiety in a new environment and will continue reflecting on this.  Offered to assist with finalization of MOST form today.  She states that she has begun reviewing it but is not ready to complete yet.  She also forgot advance directive paperwork in the car again  today.  Questions and concerns addressed. PMT will continue to support holistically.   Length of Stay: 15  Physical Exam Vitals and nursing note reviewed.  Constitutional:      General: She is not in acute distress.    Interventions: Nasal cannula in place.     Comments: 2L  Cardiovascular:     Rate and Rhythm: Normal rate.  Pulmonary:     Effort: Pulmonary effort is normal.  Neurological:     Mental Status: She is alert and easily aroused.  Psychiatric:        Behavior: Behavior is cooperative.            Vital Signs: BP 111/62 (BP Location: Right Arm)   Pulse 81   Temp (!) 97.4 F (36.3 C) (Oral)   Resp 14   Ht 5\' 4"  (1.626 m)   Wt 67.5 kg   LMP  (LMP Unknown)   SpO2 97%   BMI 25.54 kg/m  SpO2: SpO2: 97 % O2 Device: O2 Device: Room Air O2 Flow Rate: O2 Flow Rate (L/min): 2 L/min      Palliative Assessment/Data: 50%   Palliative Care Assessment & Plan   Patient Profile: 88 y.o. female  with past medical history of systolic and diastolic CHF, CABG, left bundle branch block, diabetes mellitus, hypertension  admitted on 03/10/2022 with hyponatremia, AKI, dehydration and UTI.    Patient was transferred to [facility] on 03/23/2022 for further management of ongoing hyponatremia.  Nephrology is following.  GI  also following for elevated LFTs and new imaging is concerning for cirrhosis. PMT has been consulted to assist with goals of care conversation.  Assessment: Goals of care conversation Chronic systolic and diastolic CHF Hyponatremia CAD status post CABG Failure to thrive Elevation of LFTs  Recommendations/Plan: Continue DNR Continue current care Xanax 0.5 mg p.o. at bedtime scheduled after discussion with patient's daughter Patient's daughter is uncertain whether she would like to proceed with SNF versus home health with private caregivers Patient's daughter is agreeable to outpatient palliative care and prefers hospice of the [place] consulted  for assistance Will scan advance directives into EMR when received from patient's daughter, complete MOST form as able PMT will continue to follow and support   Prognosis: Guarded to poor  Discharge Planning: To Be Determined   Care plan was discussed with patient, patient's daughter, Dr. Hanley Ben  MDM high         [name] Jeni Salles, PA-C  Palliative Medicine Team Team phone # [phone]  Thank you for allowing the Palliative Medicine Team to assist in the care of this patient. Please utilize secure chat with additional questions, if there is no response within 30 minutes please call the above phone number.  Palliative Medicine Team providers are available by phone from 7am to 7pm daily and can be reached through the team cell phone.  Should this patient require assistance outside of these hours, please call the patient's attending physician.

## 2022-03-25 NOTE — Progress Notes (Signed)
Nephrology Progress Note  Patient ID: Shernell Gimpel, female   DOB: 1927-12-04, 88 y.o.   MRN: [id number]   Subjective:    She had 850 mL uop over 11/29 as well as 3 unmeasured voids.  She got lasix 20 mg IV once yesterday.  Spoke with her daughter at bedside.  Patient is feeling better today - did have anxiety overnight though   Review of systems:  Eating better this am No n/v Shortness of breath seems a little better     O:BP 111/62 (BP Location: Right Arm)   Pulse 81   Temp (!) 97.4 F (36.3 C) (Oral)   Resp 14   Ht 5\' 4"  (1.626 m)   Wt 67.5 kg   LMP  (LMP Unknown)   SpO2 97%   BMI 25.54 kg/m   Intake/Output Summary (Last 24 hours) at 03/25/2022 0844 Last data filed at 03/25/2022 0630 Gross per 24 hour  Intake 761.72 ml  Output 850 ml  Net -88.28 ml   Intake/Output: I/O last 3 completed shifts: In: 761.7 [P.O.:670; IV Piggyback:91.7] Out: 1450 [Urine:1450]  Intake/Output this shift:  No intake/output data recorded. Weight change: -0.7 kg  General elderly female in bed in no acute distress    HEENT normocephalic atraumatic extraocular movements intact sclera anicteric Neck supple trachea midline Lungs reduced to auscultation bilaterally normal work of breathing at rest on room air Heart S1S2 no rub Abdomen soft nontender slightly distended Extremities 1+ edema appreciated  Psych normal mood and affect Neuro awake and alert and oriented x 3 provides hx and follows commands   Recent Labs  Lab 03/20/22 0505 03/21/22 0700 03/22/22 0529 03/22/22 0846 03/23/22 0411 03/23/22 0809 03/23/22 1213 03/23/22 1633 03/23/22 2033 03/24/22 0542 03/25/22 0228  NA 129* 128* 123*   < > 125* 123* 124* 124* 125* 126* 129*  K 4.9 4.4 4.6  --  4.6  --   --  4.4  --  4.0 4.5  CL 90* 89* 89*  --  88*  --   --  86*  --  91* 93*  CO2 23 24 23   --  25  --   --  25  --  26 26  GLUCOSE 145* 120* 117*  --  138*  --   --  111*  --  107* 137*  BUN 43* 53* 57*  --  57*  --    --  54*  --  47* 39*  CREATININE 1.36* 1.59* 1.56*  --  1.43*  --   --  1.45*  --  1.20* 1.03*  ALBUMIN  --   --   --   --  3.4*  --   --   --   --  2.9* 3.0*  CALCIUM 9.3 8.7* 8.2*  --  8.6*  --   --  8.6*  --  8.1* 8.5*  AST  --   --   --   --  752*  --   --   --   --  431* 331*  ALT  --   --   --   --  892*  --   --   --   --  632* 560*   < > = values in this interval not displayed.   Liver Function Tests: Recent Labs  Lab 03/23/22 0411 03/24/22 0542 03/25/22 0228  AST 752* 431* 331*  ALT 892* 632* 560*  ALKPHOS 260* 199* 188*  BILITOT 1.1 1.2 0.7  PROT 6.1* 5.3*  5.6*  ALBUMIN 3.4* 2.9* 3.0*   No results for input(s): "LIPASE", "AMYLASE" in the last 168 hours. Recent Labs  Lab 03/25/22 0228  AMMONIA 35   CBC: Recent Labs  Lab 03/23/22 1037 03/24/22 0542 03/25/22 0228  WBC 8.1 6.7 6.9  NEUTROABS  --  4.2 4.9  HGB 11.3* 10.9* 10.9*  HCT 33.4* 31.3* 33.0*  MCV 94.4 94.3 97.1  PLT 224 200 205   Cardiac Enzymes: No results for input(s): "CKTOTAL", "CKMB", "CKMBINDEX", "TROPONINI" in the last 168 hours. CBG: Recent Labs  Lab 03/23/22 1945 03/24/22 0805 03/24/22 1124 03/24/22 1706 03/25/22 0810  GLUCAP 150* 95 177* 158* 121*    Iron Studies: No results for input(s): "IRON", "TIBC", "TRANSFERRIN", "FERRITIN" in the last 72 hours. Studies/Results: US Abdomen Limited RUQ (LIVER/GB)  Result Date: 03/23/2022 CLINICAL DATA:  [id number] Transaminitis [id number] EXAM: ULTRASOUND ABDOMEN LIMITED RIGHT UPPER QUADRANT COMPARISON:  Chest x-ray 03/13/2022 FINDINGS: Gallbladder: Status post cholecystectomy. No sonographic Murphy sign noted by sonographer. Common bile duct: Diameter: 3 mm. Liver: Nodular hepatic contour. No focal lesion identified. Within normal limits in parenchymal echogenicity. Portal vein is patent on color Doppler imaging with normal direction of blood flow towards the liver. Other: Trace simple free fluid, at least trace to small volume right pleural effusion.  IMPRESSION: 1. Cirrhotic morphology of the liver. No focal liver lesions identified by ultrasound. Please note that liver protocol enhanced MR and CT are the most sensitive tests for the screening detection of hepatocellular carcinoma in the high risk setting of cirrhosis. 2. Trace simple free fluid ascites. 3. At least trace to small volume right pleural effusion. 4. Status post cholecystectomy. Electronically Signed   By: Tish Frederickson M.D.   On: 03/23/2022 15:24    aspirin EC  81 mg Oral Daily   enoxaparin (LOVENOX) injection  30 mg Subcutaneous Q24H   famotidine  20 mg Oral Daily   feeding supplement  237 mL Oral TID BM   insulin aspart  0-9 Units Subcutaneous TID WC   levothyroxine  100 mcg Oral Q0600   melatonin  9 mg Oral QHS   metoprolol succinate  12.5 mg Oral Daily   multivitamin with minerals  1 tablet Oral Daily    BMET    Component Value Date/Time   NA 129 (L) 03/25/2022 0228   K 4.5 03/25/2022 0228   CL 93 (L) 03/25/2022 0228   CO2 26 03/25/2022 0228   GLUCOSE 137 (H) 03/25/2022 0228   BUN 39 (H) 03/25/2022 0228   CREATININE 1.03 (H) 03/25/2022 0228   CALCIUM 8.5 (L) 03/25/2022 0228   GFRNONAA 51 (L) 03/25/2022 0228   GFRAA 59 (L) 11/07/2018 0620   CBC    Component Value Date/Time   WBC 6.9 03/25/2022 0228   RBC 3.40 (L) 03/25/2022 0228   HGB 10.9 (L) 03/25/2022 0228   HCT 33.0 (L) 03/25/2022 0228   PLT 205 03/25/2022 0228   MCV 97.1 03/25/2022 0228   MCH 32.1 03/25/2022 0228   MCHC 33.0 03/25/2022 0228   RDW 14.5 03/25/2022 0228   LYMPHSABS 1.1 03/25/2022 0228   MONOABS 0.7 03/25/2022 0228   EOSABS 0.1 03/25/2022 0228   BASOSABS 0.0 03/25/2022 0228     Assessment/Plan:  Hyponatremia - Secondary to zoloft/SSRI (Last dose was 11/27).  Initially felt to be due to volume depletion (poor po intake with spironolactone and lasix).  Sodium was 119 on admission.  She was given tolvaptan 15 mg on 03/14/22 with significant diuresis and improvement  of sodium  however ultimately found to have cirrhosis on later imaging.  Zoloft stopped and restarted IV lasix due to edema.  Cirrhosis not felt to be playing significant role in Na per GI Redose lasix 20 mg IV once  Remain off of zoloft indefinitely  Defer tolvaptan given liver function/cirrhosis  CMP is ordered for am  AKI/CKD stage III - possibly due to hypotension and diuretics.   Improving with supportive care  Acute on chronic combined systolic and diastolic CHF - diuretics as above  DM type 2 - per primary Hypotension - per daughter this is chronic. Transaminitis - improving. per primary team and GI.  Will avoid tolvaptan  Cirrhosis - per imaging, has cirrhotic morphology of the liver.  Appreciate GI  Estanislado Emms, MD 9:03 AM 03/25/2022

## 2022-03-25 NOTE — Progress Notes (Addendum)
Progress Note  Primary GI: Unassigned   Subjective  Chief Complaint: Elevated LFTs  Daughter [name] is in the room.  Provides majority of the history. States mother had very small hard bowel movement yesterday, last normal bowel movement 4 days ago. Denies any nausea, vomiting. Daughter states mom was taken off her Zoloft 50 mg, having increasing panic attacks.  Had episode last night with chest pain, anxiety, troponin negative this morning.  Given Xanax, fentanyl.  Has some somnolence today but able to answer questions. Patient with breakfast tray before her, no acute distress.    Objective   Vital signs in last 24 hours: Temp:  [97.4 F (36.3 C)-98.1 F (36.7 C)] 97.4 F (36.3 C) (11/30 0819) Pulse Rate:  [45-81] 81 (11/30 0819) Resp:  [14-18] 14 (11/30 0819) BP: (97-139)/(60-76) 111/62 (11/30 0819) SpO2:  [91 %-98 %] 97 % (11/30 0819) Weight:  [67.5 kg] 67.5 kg (11/30 0447) Last BM Date : 03/21/22 Last BM recorded by nurses in past 5 days Stool Type: Type 4 (Like a smooth, soft sausage or snake) (03/21/2022  5:00 AM)  General: Resting comfortably, quiet, frail, awakens easily and able to answer questions. Heart:  Regular rate and rhythm Pulm: Clear anteriorly; no wheezing Abdomen:  Soft, Obese AB, Sluggish bowel sounds. No tenderness . , No organomegaly appreciated. Extremities:  without  edema. Neurologic: Answers questions appropriately, no tremors, no asterixis Psych:  Cooperative.  Somnolent but responds to questions and awakens easily.  Intake/Output from previous day: 11/29 0701 - 11/30 0700 In: 761.7 [P.O.:670; IV Piggyback:91.7] Out: 850 [Urine:850] Intake/Output this shift: No intake/output data recorded.  Studies/Results: US Abdomen Limited RUQ (LIVER/GB)  Result Date: 03/23/2022 CLINICAL DATA:  [id number] Transaminitis [id number] EXAM: ULTRASOUND ABDOMEN LIMITED RIGHT UPPER QUADRANT COMPARISON:  Chest x-ray 03/13/2022 FINDINGS: Gallbladder: Status post  cholecystectomy. No sonographic Murphy sign noted by sonographer. Common bile duct: Diameter: 3 mm. Liver: Nodular hepatic contour. No focal lesion identified. Within normal limits in parenchymal echogenicity. Portal vein is patent on color Doppler imaging with normal direction of blood flow towards the liver. Other: Trace simple free fluid, at least trace to small volume right pleural effusion. IMPRESSION: 1. Cirrhotic morphology of the liver. No focal liver lesions identified by ultrasound. Please note that liver protocol enhanced MR and CT are the most sensitive tests for the screening detection of hepatocellular carcinoma in the high risk setting of cirrhosis. 2. Trace simple free fluid ascites. 3. At least trace to small volume right pleural effusion. 4. Status post cholecystectomy. Electronically Signed   By: Tish Frederickson M.D.   On: 03/23/2022 15:24    Lab Results: Recent Labs    03/23/22 1037 03/24/22 0542 03/25/22 0228  WBC 8.1 6.7 6.9  HGB 11.3* 10.9* 10.9*  HCT 33.4* 31.3* 33.0*  PLT 224 200 205   BMET Recent Labs    03/23/22 1633 03/23/22 2033 03/24/22 0542 03/25/22 0228  NA 124* 125* 126* 129*  K 4.4  --  4.0 4.5  CL 86*  --  91* 93*  CO2 25  --  26 26  GLUCOSE 111*  --  107* 137*  BUN 54*  --  47* 39*  CREATININE 1.45*  --  1.20* 1.03*  CALCIUM 8.6*  --  8.1* 8.5*   LFT Recent Labs    03/25/22 0228  PROT 5.6*  ALBUMIN 3.0*  AST 331*  ALT 560*  ALKPHOS 188*  BILITOT 0.7   PT/INR Recent Labs    03/23/22 1037  03/25/22 0228  LABPROT 18.2* 15.2  INR 1.5* 1.2     Scheduled Meds:  ALPRAZolam  0.5 mg Oral QHS   aspirin EC  81 mg Oral Daily   enoxaparin (LOVENOX) injection  30 mg Subcutaneous Q24H   famotidine  20 mg Oral Daily   feeding supplement  237 mL Oral TID BM   insulin aspart  0-9 Units Subcutaneous TID WC   levothyroxine  100 mcg Oral Q0600   melatonin  9 mg Oral QHS   metoprolol succinate  12.5 mg Oral Daily   multivitamin with minerals  1  tablet Oral Daily   polyethylene glycol  17 g Oral Daily   Continuous Infusions:  phytonadione (VITAMIN K) 5 mg in dextrose 5 % 50 mL IVPB 5 mg (03/24/22 1011)      Impression/Plan:   Constipation Had small hard BM yesterday, 1 prior to that was 4 days ago AB soft but slightly distended, no nausea or vomiting Will change miralax to daily, has only had one dose yesterday Consider KUB if not improving  Cirrhosis, compensated AST 331 ALT 560  Alkphos 188 TBili 0.7 INR 03/25/2022 1.2  MELD-Na: 8 - Defer variceal screening given advanced age, consider switching metoprolol to coreg.  - avoid NSAIDs; use reduced dose acetaminophen for pain if needed (<3 grams/day)  Somnolent 03/25/2022 Ammonia 35 - minimize/remove all benzos and narcotics -Ammonia level 35, no asterixis on exam, likely not hepatic encephalopathy Patient has been given Xanax 0.5 last time 1129, fentanyl 12.5 mcg 1120 9 in the evening, oxycodone twice yesterday, promethazine last given 11/27. ? Anxiety/medications contributing.  Daughters in the room states has had increasing anxiety and panic attacks being off Zoloft 50 mg cold [place].    Acute liver injury Most compatible with Dili versus congestive hepatopathy Recent Labs  Lab 03/23/22 0411 03/24/22 0542 03/25/22 0228  AST 752* 431* 331*  ALT 892* 632* 560*  ALKPHOS 260* 199* 188*  BILITOT 1.1 1.2 0.7  PROT 6.1* 5.3* 5.6*  ALBUMIN 3.4* 2.9* 3.0*   There is not evidence of biliary obstruction based on imaging or labs.  -Liver enzymes downtrending -No further treatment needed.  Hyponatremia Defer management to nephrology, improving slowly currently 129.   Principal Problem:   Hyponatremia Active Problems:   Essential hypertension, benign   CAD, NATIVE VESSEL   Diabetes (HCC)   Depression   Hypokalemia   Prolonged QT interval   Combined systolic and diastolic congestive heart failure (HCC)   Protein-calorie malnutrition, severe   Cirrhosis of  liver without ascites (HCC)   Elevated alanine aminotransferase (ALT) level    LOS: 15 days   Doree Albee  03/25/2022, 9:41 AM  ------------------------------------------------------------------------------------------------------------------------  I have taken a history, reviewed the chart and examined the patient. I performed a substantive portion of this encounter, including complete performance of at least one of the key components, in conjunction with the APP. I agree with the APP's note, impression and recommendations  Liver enzymes continue to downtrend.  Bilirubin remains normal.  INR improved following administration of vitamin K, suggestive of vitamin K deficiency rather than hepatic dysfunction as cause of elevated INR.  Patient's cirrhosis appears minimal with no evidence of portal hypertension or synthetic dysfunction, very unlikely to be playing a role in the patient's hyponatremia.  No further recommendations for evaluation or treatment of cirrhosis or acute liver injury.  Expect patient's liver enzymes will return to baseline over the course of a couple weeks.  GI will sign  off at this time.  Please reconsult with any new questions.  Patient can follow-up with me as an outpatient.  Tyquarius Paglia E. Tomasa Rand, MD [facility]

## 2022-03-25 NOTE — Progress Notes (Signed)
Mobility Specialist Progress Note   03/25/22 1600  Mobility  Activity Contraindicated/medical hold   Will hold given level of complexity, physical assist, and/or precautions as advised by RN. Patient is sleep and just received xanax and pain medication. Mobility specialist to hold today, will continue to follow.   [place] Mende Biswell, BS EXP Mobility Specialist Please contact via SecureChat or Rehab office at [phone]

## 2022-03-25 NOTE — TOC Progression Note (Signed)
Transition of Care [facility]) - Progression Note    Patient Details  Name: Erin Herman MRN: [id number] Date of Birth: [birth year]  Transition of Care [facility]) CM/SW Contact  Janae Bridgeman, RN Phone Number: 03/25/2022, 2:03 PM  Clinical Narrative:    CM called and spoke with the patient's daughter by phone and she states that when the patient is medically stable for discharge that she would like to take the patient home with home health services instead of SNF admission.  I called and spoke with the patient's daughter and the patient has DME at home including [facility], RW , [facility] and WC.  The patient's daugter requested [facility] for home health.  I called Kandee Keen, CM with [facility] and he was agreeable to services.  Home Health orders were placed.  CM Herman continue to follow the patient for discharge planning needs to return home with the daughter.   Expected Discharge Plan: Skilled Nursing Facility Barriers to Discharge: Continued Medical Work up  Expected Discharge Plan and Services Expected Discharge Plan: Skilled Nursing Facility In-house Referral: Clinical Social Work   Post Acute Care Choice: Skilled Nursing Facility Living arrangements for the past 2 months: Single Family Home                           HH Arranged: RN, PT [facility] Agency: [facility] Date [facility] Agency Contacted: 03/12/22   Representative spoke with at [facility] Agency: Kandee Keen   Social Determinants of Health (SDOH) Interventions    Readmission Risk Interventions     No data to display

## 2022-03-26 ENCOUNTER — Telehealth: Payer: Self-pay

## 2022-03-26 DIAGNOSIS — Z7189 Other specified counseling: Secondary | ICD-10-CM | POA: Diagnosis not present

## 2022-03-26 DIAGNOSIS — Z711 Person with feared health complaint in whom no diagnosis is made: Secondary | ICD-10-CM

## 2022-03-26 DIAGNOSIS — Z66 Do not resuscitate: Secondary | ICD-10-CM

## 2022-03-26 DIAGNOSIS — E871 Hypo-osmolality and hyponatremia: Secondary | ICD-10-CM | POA: Diagnosis not present

## 2022-03-26 DIAGNOSIS — F419 Anxiety disorder, unspecified: Secondary | ICD-10-CM

## 2022-03-26 DIAGNOSIS — K7460 Unspecified cirrhosis of liver: Secondary | ICD-10-CM

## 2022-03-26 DIAGNOSIS — I10 Essential (primary) hypertension: Secondary | ICD-10-CM | POA: Diagnosis not present

## 2022-03-26 DIAGNOSIS — R7401 Elevation of levels of liver transaminase levels: Secondary | ICD-10-CM | POA: Diagnosis not present

## 2022-03-26 DIAGNOSIS — G4700 Insomnia, unspecified: Secondary | ICD-10-CM

## 2022-03-26 DIAGNOSIS — Z789 Other specified health status: Secondary | ICD-10-CM

## 2022-03-26 DIAGNOSIS — Z515 Encounter for palliative care: Secondary | ICD-10-CM | POA: Diagnosis not present

## 2022-03-26 LAB — COMPREHENSIVE METABOLIC PANEL
ALT: 420 U/L — ABNORMAL HIGH (ref 0–44)
AST: 215 U/L — ABNORMAL HIGH (ref 15–41)
Albumin: 3.2 g/dL — ABNORMAL LOW (ref 3.5–5.0)
Alkaline Phosphatase: 172 U/L — ABNORMAL HIGH (ref 38–126)
Anion gap: 9 (ref 5–15)
BUN: 33 mg/dL — ABNORMAL HIGH (ref 8–23)
CO2: 30 mmol/L (ref 22–32)
Calcium: 9 mg/dL (ref 8.9–10.3)
Chloride: 92 mmol/L — ABNORMAL LOW (ref 98–111)
Creatinine, Ser: 1 mg/dL (ref 0.44–1.00)
GFR, Estimated: 53 mL/min — ABNORMAL LOW (ref >60)
Glucose, Bld: 151 mg/dL — ABNORMAL HIGH (ref 70–99)
Potassium: 4.8 mmol/L (ref 3.5–5.1)
Sodium: 131 mmol/L — ABNORMAL LOW (ref 135–145)
Total Bilirubin: 0.6 mg/dL (ref 0.3–1.2)
Total Protein: 6.1 g/dL — ABNORMAL LOW (ref 6.5–8.1)

## 2022-03-26 LAB — CBC WITH DIFFERENTIAL/PLATELET
Abs Immature Granulocytes: 0.02 10*3/uL (ref 0.00–0.07)
Basophils Absolute: 0 10*3/uL (ref 0.0–0.1)
Basophils Relative: 0 %
Eosinophils Absolute: 0.1 10*3/uL (ref 0.0–0.5)
Eosinophils Relative: 2 %
HCT: 34.3 % — ABNORMAL LOW (ref 36.0–46.0)
Hemoglobin: 11.2 g/dL — ABNORMAL LOW (ref 12.0–15.0)
Immature Granulocytes: 0 %
Lymphocytes Relative: 21 %
Lymphs Abs: 1.3 10*3/uL (ref 0.7–4.0)
MCH: 32 pg (ref 26.0–34.0)
MCHC: 32.7 g/dL (ref 30.0–36.0)
MCV: 98 fL (ref 80.0–100.0)
Monocytes Absolute: 0.8 10*3/uL (ref 0.1–1.0)
Monocytes Relative: 12 %
Neutro Abs: 4.2 10*3/uL (ref 1.7–7.7)
Neutrophils Relative %: 65 %
Platelets: 205 10*3/uL (ref 150–400)
RBC: 3.5 MIL/uL — ABNORMAL LOW (ref 3.87–5.11)
RDW: 14.7 % (ref 11.5–15.5)
WBC: 6.4 10*3/uL (ref 4.0–10.5)
nRBC: 0 % (ref 0.0–0.2)

## 2022-03-26 LAB — PROTIME-INR
INR: 1.1 (ref 0.8–1.2)
Prothrombin Time: 14.1 seconds (ref 11.4–15.2)

## 2022-03-26 LAB — MAGNESIUM: Magnesium: 2.1 mg/dL (ref 1.7–2.4)

## 2022-03-26 LAB — GLUCOSE, CAPILLARY
Glucose-Capillary: 133 mg/dL — ABNORMAL HIGH (ref 70–99)
Glucose-Capillary: 107 mg/dL — ABNORMAL HIGH (ref 70–99)
Glucose-Capillary: 148 mg/dL — ABNORMAL HIGH (ref 70–99)
Glucose-Capillary: 131 mg/dL — ABNORMAL HIGH (ref 70–99)

## 2022-03-26 LAB — BRAIN NATRIURETIC PEPTIDE: B Natriuretic Peptide: 789.1 pg/mL — ABNORMAL HIGH (ref 0.0–100.0)

## 2022-03-26 MED ORDER — FUROSEMIDE 10 MG/ML IJ SOLN
60.0000 mg | Freq: Once | INTRAMUSCULAR | Status: AC
  Administered 2022-03-26: 60 mg via INTRAVENOUS
  Filled 2022-03-26: qty 6

## 2022-03-26 MED ORDER — TRAZODONE HCL 50 MG PO TABS
50.0000 mg | ORAL_TABLET | Freq: Every day | ORAL | Status: DC
  Administered 2022-03-26: 50 mg via ORAL
  Filled 2022-03-26: qty 1

## 2022-03-26 MED ORDER — MUSCLE RUB 10-15 % EX CREA
TOPICAL_CREAM | CUTANEOUS | Status: DC | PRN
  Administered 2022-03-27: 1 via TOPICAL
  Filled 2022-03-26: qty 85

## 2022-03-26 NOTE — Progress Notes (Signed)
Nephrology Progress Note  Patient ID: Erin Herman, female   DOB: 30-Sep-1927, 88 y.o.   MRN: [id number]   Subjective:    She had 500 mL uop over 11/30 as well as 1 unmeasured void.  She got lasix 20 mg IV once again yesterday.  Spoke with her daughter at bedside.  Her daughter states that she didn't eat well yesterday.  Spoke with RN and tech to let them know of her agitation/confusion this am   Review of systems:  No n/v Shortness of breath a little better Somewhat limited due to confusion - she has gotten PRNs    O:BP 115/73 (BP Location: Left Arm)   Pulse 83   Temp 98.3 F (36.8 C)   Resp 18   Ht 5\' 4"  (1.626 m)   Wt 67.5 kg   LMP  (LMP Unknown)   SpO2 97%   BMI 25.54 kg/m   Intake/Output Summary (Last 24 hours) at 03/26/2022 0931 Last data filed at 03/25/2022 1231 Gross per 24 hour  Intake 118 ml  Output 500 ml  Net -382 ml   Intake/Output: I/O last 3 completed shifts: In: 474 [P.O.:474] Out: 900 [Urine:900]  Intake/Output this shift:  No intake/output data recorded. Weight change:   General elderly female in bed in no acute distress     HEENT normocephalic atraumatic extraocular movements intact sclera anicteric Neck supple trachea midline Lungs reduced to auscultation bilaterally normal work of breathing at rest on room air Heart S1S2 no rub Abdomen soft nontender slightly distended Extremities 1+ edema appreciated  Neuro oriented to person, year, but not to location   Recent Labs  Lab 03/21/22 0700 03/22/22 0529 03/22/22 0846 03/23/22 0411 03/23/22 0809 03/23/22 1213 03/23/22 1633 03/23/22 2033 03/24/22 0542 03/25/22 0228 03/26/22 0555  NA 128* 123*   < > 125* 123* 124* 124* 125* 126* 129* 131*  K 4.4 4.6  --  4.6  --   --  4.4  --  4.0 4.5 4.8  CL 89* 89*  --  88*  --   --  86*  --  91* 93* 92*  CO2 24 23  --  25  --   --  25  --  26 26 30   GLUCOSE 120* 117*  --  138*  --   --  111*  --  107* 137* 151*  BUN 53* 57*  --  57*  --   --   54*  --  47* 39* 33*  CREATININE 1.59* 1.56*  --  1.43*  --   --  1.45*  --  1.20* 1.03* 1.00  ALBUMIN  --   --   --  3.4*  --   --   --   --  2.9* 3.0* 3.2*  CALCIUM 8.7* 8.2*  --  8.6*  --   --  8.6*  --  8.1* 8.5* 9.0  AST  --   --   --  752*  --   --   --   --  431* 331* 215*  ALT  --   --   --  892*  --   --   --   --  632* 560* 420*   < > = values in this interval not displayed.   Liver Function Tests: Recent Labs  Lab 03/24/22 0542 03/25/22 0228 03/26/22 0555  AST 431* 331* 215*  ALT 632* 560* 420*  ALKPHOS 199* 188* 172*  BILITOT 1.2 0.7 0.6  PROT 5.3* 5.6* 6.1*  ALBUMIN 2.9* 3.0* 3.2*   No results for input(s): "LIPASE", "AMYLASE" in the last 168 hours. Recent Labs  Lab 03/25/22 0228  AMMONIA 35   CBC: Recent Labs  Lab 03/23/22 1037 03/24/22 0542 03/25/22 0228 03/26/22 0555  WBC 8.1 6.7 6.9 6.4  NEUTROABS  --  4.2 4.9 4.2  HGB 11.3* 10.9* 10.9* 11.2*  HCT 33.4* 31.3* 33.0* 34.3*  MCV 94.4 94.3 97.1 98.0  PLT 224 200 205 205   Cardiac Enzymes: No results for input(s): "CKTOTAL", "CKMB", "CKMBINDEX", "TROPONINI" in the last 168 hours. CBG: Recent Labs  Lab 03/25/22 0810 03/25/22 1139 03/25/22 1656 03/25/22 2159 03/26/22 0842  GLUCAP 121* 180* 163* 140* 133*    Iron Studies: No results for input(s): "IRON", "TIBC", "TRANSFERRIN", "FERRITIN" in the last 72 hours. Studies/Results: No results found.  ALPRAZolam  0.5 mg Oral QHS   aspirin EC  81 mg Oral Daily   enoxaparin (LOVENOX) injection  30 mg Subcutaneous Q24H   famotidine  20 mg Oral Daily   feeding supplement  237 mL Oral TID BM   insulin aspart  0-9 Units Subcutaneous TID WC   levothyroxine  100 mcg Oral Q0600   melatonin  9 mg Oral QHS   metoprolol succinate  12.5 mg Oral Daily   multivitamin with minerals  1 tablet Oral Daily   polyethylene glycol  17 g Oral Daily    BMET    Component Value Date/Time   NA 131 (L) 03/26/2022 0555   K 4.8 03/26/2022 0555   CL 92 (L) 03/26/2022 0555    CO2 30 03/26/2022 0555   GLUCOSE 151 (H) 03/26/2022 0555   BUN 33 (H) 03/26/2022 0555   CREATININE 1.00 03/26/2022 0555   CALCIUM 9.0 03/26/2022 0555   GFRNONAA 53 (L) 03/26/2022 0555   GFRAA 59 (L) 11/07/2018 0620   CBC    Component Value Date/Time   WBC 6.4 03/26/2022 0555   RBC 3.50 (L) 03/26/2022 0555   HGB 11.2 (L) 03/26/2022 0555   HCT 34.3 (L) 03/26/2022 0555   PLT 205 03/26/2022 0555   MCV 98.0 03/26/2022 0555   MCH 32.0 03/26/2022 0555   MCHC 32.7 03/26/2022 0555   RDW 14.7 03/26/2022 0555   LYMPHSABS 1.3 03/26/2022 0555   MONOABS 0.8 03/26/2022 0555   EOSABS 0.1 03/26/2022 0555   BASOSABS 0.0 03/26/2022 0555     Assessment/Plan:  Hyponatremia - Secondary to zoloft/SSRI (Last dose was 11/27).  Initially felt to be due to volume depletion (poor po intake with spironolactone and lasix).  Sodium was 119 on admission.  She was given tolvaptan 15 mg on 03/14/22 with significant diuresis and improvement of sodium however ultimately found to have cirrhosis on later imaging.  Zoloft stopped and restarted IV lasix due to edema.  Cirrhosis not felt to be playing significant role in Na per GI Lasix 60 mg IV once today   Remain off of zoloft indefinitely  Defer tolvaptan given liver function/cirrhosis  CMP is ordered for am  Note that her home lasix regimen is reported at 40 mg BID.  She is also typically on spironolactone 12.5 mg daily which we have held AKI/CKD stage III - possibly due to hypotension   Improving with supportive care - tolerating diuretics to optimize volume status Acute on chronic combined systolic and diastolic CHF - diuretics as above  DM type 2 - per primary Hypotension - per daughter this is chronic. Transaminitis - improving. per primary team and GI.  Will avoid  tolvaptan  Cirrhosis - per imaging, has cirrhotic morphology of the liver.  Appreciate GI  Disposition - per primary team.  Discussed with primary team   Estanislado Emms, MD 9:59  AM 03/26/2022

## 2022-03-26 NOTE — TOC Progression Note (Signed)
Transition of Care (TOC) - Progression Note    Patient Details  Name: Erin Herman MRN: [id number] Date of Birth: 03/07/1928  Transition of Care (TOC) CM/SW Contact   R Stubbldfield, RN Phone Number: 03/26/2022, 11:53 AM  Clinical Narrative:    CM met with the patient's daughter at the bedside and gave choice regarding Outpatient Palliative care and [facility] unable to provide services in [place].  The patient's daughter chose - [facility] - I called Samantha, CM with company - now [employer] and placed the referral.  The patient will be medically stable to discharge over the weekend once medications are adjusted by the MD.  Patient was increasingly confused and Trazodone was added by MD - Will likely discharge home over the weekend with daughter.  [facility] is set up.  Daughter has hired private [employer] for night time assistance - private female that is not with company.   Expected Discharge Plan: Skilled Nursing Facility Barriers to Discharge: Continued Medical Work up  Expected Discharge Plan and Services Expected Discharge Plan: Skilled Nursing Facility In-house Referral: Clinical Social Work   Post Acute Care Choice: Skilled Nursing Facility Living arrangements for the past 2 months: Single Family Home                           HH Arranged: RN, PT HH Agency: [facility] Date HH Agency Contacted: 03/12/22   Representative spoke with at HH Agency: [place]   Social Determinants of Health (SDOH) Interventions    Readmission Risk Interventions     No data to display          

## 2022-03-26 NOTE — Telephone Encounter (Signed)
-----   Message from Jenel Lucks, MD sent at 03/26/2022  7:41 AM EST ----- Regarding: Hospital follow up Bonita Quin,  Can you please set up a routine follow up visit with me for cirrhosis in the next 4-6 weeks?  Thanks

## 2022-03-26 NOTE — Progress Notes (Signed)
Erin Cancellation Note  Patient Details Name: Erin Herman MRN: [id number] DOB: 12-12-1927   Cancelled Treatment:    Reason Eval/Treat Not Completed: Fatigue/lethargy limiting ability to participate - sleeping upon arrival to room, will check back as schedule allows.  Erin Herman, Erin Herman Erin Herman Pager [phone]  Office [phone]    Erin Herman 03/26/2022, 1:09 PM

## 2022-03-26 NOTE — Progress Notes (Signed)
Daily Progress Note   Patient Name: Erin Herman       Date: 03/26/2022 DOB: [birth year]  Age: 88 y.o. MRN#: [id number] Attending Physician: Glade LloydAlekh, Kshitiz, MD Primary Care Physician: Kirstie PeriShah, Ashish, MD Admit Date: 03/10/2022  Reason for Consultation/Follow-up: Establishing goals of care  Subjective: Chart review performed. Received report from primary RN - no acute concerns. RN states patient didn't sleep well last night, was restless. Per RN, patient not interested in eating today, struggled to get patient to take her medications.  Went to visit patient at bedside - daughter/Erin Herman present. Patient was lying in bed asleep - I did not attempt to wake her. No signs or non-verbal gestures of pain or discomfort noted. No respiratory distress, increased work of breathing, or secretions noted.   Emotional support provided [facility]. Elnita MaxwellCheryl confirms patient had a "rough night and didn't sleep well." Reviewed xanax that was scheduled at bedtime as well as PRN dose for daytime use. Also reviewed that trazodone was added to scheduled night time medications. Will see how patient tolerates tonight and continue to make adjustments as needed - Elnita MaxwellCheryl is agreeable this.  Elnita MaxwellCheryl confirms goal is for [facility] and [facility]. In addition, she is setting up 24/7 support with private duty caregivers. Education provided that outpatient Palliative Care could continue to make recommendations/adjustments to medications for insomnia/restlessness/anxiety if needed along with PCP.  MOST form completed as outlined under Recommendation section below. DNR/DNI confirmed.  Copy of HCPOA obtained.  Elnita MaxwellCheryl wonders if patient is able to have her A1C checked while admitted. She has an  appointment with her PCP Monday, Dec 4 to have this done; however, it is unlikely she will make this appointment. Will discuss with attending.  All questions and concerns addressed. Encouraged to call with questions and/or concerns. PMT card provided.  Discussed A1C request with Dr. Hanley BenAlekh - he notes patient had it checked on 03/11/22 with result of 6.6. Too early to recheck. Will notify Elnita MaxwellCheryl.   Length of Stay: 16  Current Medications: Scheduled Meds:   ALPRAZolam  0.5 mg Oral QHS   aspirin EC  81 mg Oral Daily   enoxaparin (LOVENOX) injection  30 mg Subcutaneous Q24H   famotidine  20 mg Oral Daily   feeding supplement  237 mL Oral TID BM  insulin aspart  0-9 Units Subcutaneous TID WC   levothyroxine  100 mcg Oral Q0600   metoprolol succinate  12.5 mg Oral Daily   multivitamin with minerals  1 tablet Oral Daily   polyethylene glycol  17 g Oral Daily   traZODone  50 mg Oral QHS    Continuous Infusions:   PRN Meds: acetaminophen **OR** acetaminophen, ALPRAZolam, alum & mag hydroxide-simeth, fentaNYL (SUBLIMAZE) injection, guaiFENesin, levalbuterol, lidocaine, loperamide, mouth rinse, oxyCODONE, promethazine  Physical Exam Vitals and nursing note reviewed.  Constitutional:      General: She is not in acute distress.    Appearance: She is ill-appearing.  Pulmonary:     Effort: No respiratory distress.  Skin:    General: Skin is warm and dry.     Coloration: Skin is pale.  Neurological:     Motor: Weakness present.     Comments: asleep             Vital Signs: BP 115/73 (BP Location: Left Arm)   Pulse 83   Temp 98.3 F (36.8 C)   Resp 18   Ht 5\' 4"  (1.626 m)   Wt 67.5 kg   LMP  (LMP Unknown)   SpO2 97%   BMI 25.54 kg/m  SpO2: SpO2: 97 % O2 Device: O2 Device: Room Air O2 Flow Rate: O2 Flow Rate (L/min): 2 L/min  Intake/output summary:  Intake/Output Summary (Last 24 hours) at 03/26/2022 1048 Last data filed at 03/26/2022 1007 Gross per 24 hour  Intake 118 ml   Output 850 ml  Net -732 ml   LBM: Last BM Date : 03/25/22 Baseline Weight: Weight: 56.2 kg Most recent weight: Weight: 67.5 kg       Palliative Assessment/Data: PPS 20-30%      Patient Active Problem List   Diagnosis Date Noted   Protein-calorie malnutrition, severe 03/24/2022   Cirrhosis of liver without ascites (HCC) 03/24/2022   Elevated alanine aminotransferase (ALT) level 03/24/2022   Hyponatremia 03/10/2022   Prolonged QT interval 03/10/2022   Combined systolic and diastolic congestive heart failure (HCC) 03/10/2022   Hypothyroidism 11/23/2021   Leukocytosis 11/23/2021   Depression 07/29/2021   Hypokalemia 07/29/2021   GERD (gastroesophageal reflux disease) 12/29/2020   Chest pain 11/06/2020   Chronic systolic (congestive) heart failure (HCC)    Status post left hip replacement 04/29/2020   NSTEMI (non-ST elevated myocardial infarction) (HCC) 11/04/2018   Dysphagia 05/10/2018   Diabetes (HCC) 02/27/2018   S/P right THA, AA 03/01/2017   Postoperative anemia due to acute blood loss 05/22/2014   DJD (degenerative joint disease) of knee 05/20/2014   Primary localized osteoarthritis of left knee 04/30/2014   Preoperative cardiovascular examination 04/04/2014   Chronic diastolic heart failure (HCC) 07/03/2012   CAROTID ARTERY DISEASE 12/09/2009   Essential hypertension, benign 08/05/2008   CAD, NATIVE VESSEL 08/05/2008   Secondary cardiomyopathy (HCC) 08/05/2008   Aortic valve disorder 08/05/2008   Hyperlipidemia 08/03/2008    Palliative Care Assessment & Plan   Patient Profile: 88 y.o. female  with past medical history of systolic and diastolic CHF, CABG, left bundle branch block, diabetes mellitus, hypertension  admitted on 03/10/2022 with hyponatremia, AKI, dehydration and UTI.    Patient was transferred to [facility] on 03/23/2022 for further management of ongoing hyponatremia.  Nephrology is following.  GI also following for elevated LFTs and new  imaging is concerning for cirrhosis. PMT has been consulted to assist with goals of care conversation.  Assessment: Principal Problem:  Hyponatremia Active Problems:   Essential hypertension, benign   CAD, NATIVE VESSEL   Diabetes (HCC)   Depression   Hypokalemia   Prolonged QT interval   Combined systolic and diastolic congestive heart failure (HCC)   Protein-calorie malnutrition, severe   Cirrhosis of liver without ascites (HCC)   Elevated alanine aminotransferase (ALT) level   Concern about end of life  Recommendations/Plan: Continue supportive treatment DNR/DNI confirmed - durable DNR form completed and placed in shadow chart Continue scheduled and PRN xanax for anxiety. Agree with adding trazodone at bedtime for insomnia  Goal is for patient to return home with [facility] and outpatient Palliative Care to follow. Daughter will have 24/7 private duty caregiver support to assist with patient's care MOST form completed as follows: DNR, Comfort Measures, Antibiotics if indicated, IVF if indicated, No feeding tube Copy of durable DNR form, MOST form, and HCPOA was made and will be scanned into Vynca/ACP tab PMT will continue to follow and support holistically  Goals of Care and Additional Recommendations: Limitations on Scope of Treatment: Avoid Hospitalization, Full Scope Treatment, No Artificial Feeding, and No Tracheostomy  Code Status:    Code Status Orders  (From admission, onward)           Start     Ordered   03/24/22 0931  Do not attempt resuscitation (DNR)  Continuous       Question Answer Comment  In the event of cardiac or respiratory ARREST Do not call a "code blue"   In the event of cardiac or respiratory ARREST Do not perform Intubation, CPR, defibrillation or ACLS   In the event of cardiac or respiratory ARREST Use medication by any route, position, wound care, and other measures to relive pain and suffering. May use oxygen, suction and manual treatment of airway  obstruction as needed for comfort.      03/24/22 0930           Code Status History     Date Active Date Inactive Code Status Order ID Comments User Context   03/10/2022 1930 03/24/2022 0930 Full Code [id number]  Onnie Boer, MD Inpatient   11/23/2021 0145 11/23/2021 2214 Full Code [id number]  Zierle-Ghosh, Asia B, DO ED   07/29/2021 0611 07/30/2021 1826 Partial Code [id number]  Zierle-Ghosh, Asia B, DO ED   07/28/2021 2330 07/29/2021 0611 Full Code [id number]  Zierle-Ghosh, Asia B, DO ED   12/29/2020 2133 12/30/2020 2045 Full Code [id number]  Zierle-Ghosh, Asia B, DO Inpatient   11/06/2020 1504 11/08/2020 1903 Full Code [id number]  Shon Hale, MD ED   04/29/2020 1302 04/30/2020 2229 Full Code [id number]  Shelly Coss Inpatient   11/04/2018 2126 11/07/2018 2141 Full Code [id number]  Rometta Emery, MD Inpatient   03/01/2017 1533 03/02/2017 1943 Full Code [id number]  Shelly Coss Inpatient   05/20/2014 1144 05/22/2014 1515 Full Code [id number]  Pascal Lux, PA-C Inpatient      Advance Directive Documentation    Flowsheet Row Most Recent Value  Type of Advance Directive Healthcare Power of Attorney, Living will  Pre-existing out of facility DNR order (yellow form or pink MOST form) --  "MOST" Form in Place? --       Prognosis:  Poor  Discharge Planning: Home with Palliative Services and [facility]  Care plan was discussed with primary RN, patient's daughter/HCPOA  Thank you for allowing the Palliative Medicine Team to assist in the care of this patient.   Total Time 60 minutes Prolonged Time Billed  no       Greater than 50%  of this time was spent counseling and coordinating care related to the above assessment and plan.  Haskel Khan, NP  Please contact Palliative Medicine Team phone at [phone] for questions and concerns.   *Portions of this note are a verbal dictation therefore any spelling and/or grammatical errors are due to the "[employer]agon [employer] One"  system interpretation.

## 2022-03-26 NOTE — Telephone Encounter (Signed)
Pt scheduled to see Dr. Tomasa Rand 05/06/22 at 9:30am.

## 2022-03-26 NOTE — Progress Notes (Signed)
PROGRESS NOTE    Erin Herman  EVO:[id number] DOB: 08-24-1927 DOA: 03/10/2022 PCP: Kirstie Peri, MD   Brief Narrative:  88 y.o. female with medical history significant for chronic systolic and diastolic CHF, CABG, left bundle branch block, diabetes mellitus, hypertension presented with ongoing nausea.  She was diagnosed with UTI, dehydration, hyponatremia and admitted to [facility].  She was transferred to [facility] for further management of ongoing hyponatremia on 03/23/2022 for continued nephrology follow-up.  GI was consulted for elevated LFTs.    Assessment & Plan:   Hyponatremia -Initial sodium was 119. -Nephrology following.  Sodium 131 today.  Has received IV Lasix daily for the last 2 days.  Diuretics as per nephrology. -Repeat a.m. labs.  Chronic systolic and diastolic heart failure -Last echo in 10/2018 had shown EF of 35% with grade 3 diastolic dysfunction -Strict input and output.  Daily weights.  Fluid restriction.  Diuretic management as per nephrology.  LFT elevation New diagnosis of possible cirrhosis of liver -LFTs improving.  Monitor.  Right upper quadrant ultrasound showed possible cirrhosis of liver, which is a new diagnosis.  GI following.  Follow further recommendations. -Patient has had issues with increased anxiety at night.  Will check ammonia level in AM.  CAD with history of CABG and stenting -Stable.  No chest pain reported.  Outpatient follow-up with cardiology.  Continue aspirin and beta-blocker  Hypothyroidism -Continue levothyroxine  Anemia of chronic disease -From chronic illnesses.  Hemoglobin stable.  Monitor intermittently  Diabetes mellitus type 2 with hyperglycemia and hypoglycemia -Metformin on hold.  Continue CBGs with SSI  Prolonged QT, chronic -Currently stable.  Essential hypertension -Blood pressure on the lower side.  Monitor.  Continue metoprolol.  Imdur on hold.  Goals of care Failure to thrive Poor  oral intake Generalized deconditioning -Patient has had extremely poor oral intake few weeks prior to presentation along with failure to thrive.  CODE STATUS has been changed to DNR after discussion with daughter on 03/24/2022.   -Palliative care following -PT recommending SNF placement.  Daughter interested in home with home health.  TOC following. -Patient was placed on scheduled Xanax at night and as needed Xanax during the daytime for anxiety.  Patient had a rough night last night with lack of sleep.  Daughter agreeable for adding scheduled trazodone 50 mg at bedtime along with scheduled Xanax.  Will avoid melatonin as patient did not tolerate it in the past as per the daughter.  DVT prophylaxis: Lovenox Code Status: DNR Family Communication: Daughter at bedside  Disposition Plan: Status is: Inpatient Remains inpatient appropriate because: of severity of illness.  Possible discharge in 1 to 2 days home once cleared by nephrology    Consultants: Nephrology/GI/palliative care  Procedures: None  Antimicrobials: None   Subjective: Patient seen and examined at bedside.  Still extremely slow to respond; poor historian; drowsy.  Daughter at bedside, reports that patient had a rough night last night with anxiety and difficulty falling asleep.  No seizures, fever, agitation or vomiting reported. Objective: Vitals:   03/25/22 0819 03/25/22 1530 03/25/22 2018 03/26/22 0515  BP: 111/62 106/64 108/71 101/65  Pulse: 81 92 (!) 112 63  Resp: 14 14 16    Temp: (!) 97.4 F (36.3 C) 97.6 F (36.4 C) (!) 97.4 F (36.3 C) 97.7 F (36.5 C)  TempSrc: Oral Oral Oral Oral  SpO2: 97% 95% 95% 98%  Weight:      Height:        Intake/Output Summary (Last  24 hours) at 03/26/2022 0748 Last data filed at 03/25/2022 1231 Gross per 24 hour  Intake 354 ml  Output 500 ml  Net -146 ml    Filed Weights   03/23/22 0504 03/24/22 0420 03/25/22 0447  Weight: 68.3 kg 68.2 kg 67.5 kg     Examination:  General: No distress currently.  Still on room air.  Looks chronically ill and deconditioned. ENT/neck: No obvious neck masses or JVD elevation  respiratory: Bilateral decreased breath sounds at bases with some scattered crackles CVS: Currently rate controlled; S1 and S2 heard  abdominal: Soft, nontender, distended mildly; no organomegaly, bowel sounds heard  extremities: No cyanosis; trace lower extremity edema present  CNS: Drowsy; still very slow to respond.  Poor historian.  No focal neurologic deficit.  Able to move extremities  lymph: No palpable lymphadenopathy Skin: No obvious petechiae/rashes psych: Flat affect.  Currently not agitated musculoskeletal: No obvious joint erythema/tenderness    Data Reviewed: I have personally reviewed following labs and imaging studies  CBC: Recent Labs  Lab 03/23/22 1037 03/24/22 0542 03/25/22 0228 03/26/22 0555  WBC 8.1 6.7 6.9 6.4  NEUTROABS  --  4.2 4.9 4.2  HGB 11.3* 10.9* 10.9* 11.2*  HCT 33.4* 31.3* 33.0* 34.3*  MCV 94.4 94.3 97.1 98.0  PLT 224 200 205 205    Basic Metabolic Panel: Recent Labs  Lab 03/23/22 0411 03/23/22 0809 03/23/22 1037 03/23/22 1213 03/23/22 1633 03/23/22 2033 03/24/22 0542 03/25/22 0228 03/26/22 0555  NA 125*   < >  --    < > 124* 125* 126* 129* 131*  K 4.6  --   --   --  4.4  --  4.0 4.5 4.8  CL 88*  --   --   --  86*  --  91* 93* 92*  CO2 25  --   --   --  25  --  26 26 30   GLUCOSE 138*  --   --   --  111*  --  107* 137* 151*  BUN 57*  --   --   --  54*  --  47* 39* 33*  CREATININE 1.43*  --   --   --  1.45*  --  1.20* 1.03* 1.00  CALCIUM 8.6*  --   --   --  8.6*  --  8.1* 8.5* 9.0  MG  --   --  1.9  --   --   --  2.0 2.0 2.1   < > = values in this interval not displayed.    GFR: Estimated Creatinine Clearance: 33.9 mL/min (by C-G formula based on SCr of 1 mg/dL). Liver Function Tests: Recent Labs  Lab 03/23/22 0411 03/24/22 0542 03/25/22 0228 03/26/22 0555   AST 752* 431* 331* 215*  ALT 892* 632* 560* 420*  ALKPHOS 260* 199* 188* 172*  BILITOT 1.1 1.2 0.7 0.6  PROT 6.1* 5.3* 5.6* 6.1*  ALBUMIN 3.4* 2.9* 3.0* 3.2*    No results for input(s): "LIPASE", "AMYLASE" in the last 168 hours. Recent Labs  Lab 03/25/22 0228  AMMONIA 35    Coagulation Profile: Recent Labs  Lab 03/23/22 1037 03/25/22 0228 03/26/22 0555  INR 1.5* 1.2 1.1    Cardiac Enzymes: No results for input(s): "CKTOTAL", "CKMB", "CKMBINDEX", "TROPONINI" in the last 168 hours. BNP (last 3 results) No results for input(s): "PROBNP" in the last 8760 hours. HbA1C: No results for input(s): "HGBA1C" in the last 72 hours. CBG: Recent Labs  Lab 03/24/22 1706  03/25/22 0810 03/25/22 1139 03/25/22 1656 03/25/22 2159  GLUCAP 158* 121* 180* 163* 140*    Lipid Profile: No results for input(s): "CHOL", "HDL", "LDLCALC", "TRIG", "CHOLHDL", "LDLDIRECT" in the last 72 hours. Thyroid Function Tests: No results for input(s): "TSH", "T4TOTAL", "FREET4", "T3FREE", "THYROIDAB" in the last 72 hours. Anemia Panel: No results for input(s): "VITAMINB12", "FOLATE", "FERRITIN", "TIBC", "IRON", "RETICCTPCT" in the last 72 hours. Sepsis Labs: No results for input(s): "PROCALCITON", "LATICACIDVEN" in the last 168 hours.  No results found for this or any previous visit (from the past 240 hour(s)).       Radiology Studies: No results found.      Scheduled Meds:  ALPRAZolam  0.5 mg Oral QHS   aspirin EC  81 mg Oral Daily   enoxaparin (LOVENOX) injection  30 mg Subcutaneous Q24H   famotidine  20 mg Oral Daily   feeding supplement  237 mL Oral TID BM   insulin aspart  0-9 Units Subcutaneous TID WC   levothyroxine  100 mcg Oral Q0600   melatonin  9 mg Oral QHS   metoprolol succinate  12.5 mg Oral Daily   multivitamin with minerals  1 tablet Oral Daily   polyethylene glycol  17 g Oral Daily   Continuous Infusions:          Glade Lloyd, MD [facility] 03/26/2022, 7:48 AM

## 2022-03-27 DIAGNOSIS — E871 Hypo-osmolality and hyponatremia: Secondary | ICD-10-CM | POA: Diagnosis not present

## 2022-03-27 DIAGNOSIS — E43 Unspecified severe protein-calorie malnutrition: Secondary | ICD-10-CM | POA: Diagnosis not present

## 2022-03-27 DIAGNOSIS — I10 Essential (primary) hypertension: Secondary | ICD-10-CM | POA: Diagnosis not present

## 2022-03-27 DIAGNOSIS — E876 Hypokalemia: Secondary | ICD-10-CM | POA: Diagnosis not present

## 2022-03-27 DIAGNOSIS — K7460 Unspecified cirrhosis of liver: Secondary | ICD-10-CM | POA: Diagnosis not present

## 2022-03-27 DIAGNOSIS — R110 Nausea: Secondary | ICD-10-CM

## 2022-03-27 LAB — GLUCOSE, CAPILLARY
Glucose-Capillary: 138 mg/dL — ABNORMAL HIGH (ref 70–99)
Glucose-Capillary: 132 mg/dL — ABNORMAL HIGH (ref 70–99)
Glucose-Capillary: 168 mg/dL — ABNORMAL HIGH (ref 70–99)

## 2022-03-27 LAB — CBC WITH DIFFERENTIAL/PLATELET
Abs Immature Granulocytes: 0.02 10*3/uL (ref 0.00–0.07)
Basophils Absolute: 0 10*3/uL (ref 0.0–0.1)
Basophils Relative: 1 %
Eosinophils Absolute: 0.1 10*3/uL (ref 0.0–0.5)
Eosinophils Relative: 2 %
HCT: 33.2 % — ABNORMAL LOW (ref 36.0–46.0)
Hemoglobin: 11 g/dL — ABNORMAL LOW (ref 12.0–15.0)
Immature Granulocytes: 0 %
Lymphocytes Relative: 23 %
Lymphs Abs: 1.3 10*3/uL (ref 0.7–4.0)
MCH: 32.1 pg (ref 26.0–34.0)
MCHC: 33.1 g/dL (ref 30.0–36.0)
MCV: 96.8 fL (ref 80.0–100.0)
Monocytes Absolute: 0.7 10*3/uL (ref 0.1–1.0)
Monocytes Relative: 11 %
Neutro Abs: 3.7 10*3/uL (ref 1.7–7.7)
Neutrophils Relative %: 63 %
Platelets: 238 10*3/uL (ref 150–400)
RBC: 3.43 MIL/uL — ABNORMAL LOW (ref 3.87–5.11)
RDW: 14.6 % (ref 11.5–15.5)
WBC: 5.8 10*3/uL (ref 4.0–10.5)
nRBC: 0 % (ref 0.0–0.2)

## 2022-03-27 LAB — COMPREHENSIVE METABOLIC PANEL
ALT: 314 U/L — ABNORMAL HIGH (ref 0–44)
AST: 146 U/L — ABNORMAL HIGH (ref 15–41)
Albumin: 3.2 g/dL — ABNORMAL LOW (ref 3.5–5.0)
Alkaline Phosphatase: 163 U/L — ABNORMAL HIGH (ref 38–126)
Anion gap: 11 (ref 5–15)
BUN: 30 mg/dL — ABNORMAL HIGH (ref 8–23)
CO2: 31 mmol/L (ref 22–32)
Calcium: 9.4 mg/dL (ref 8.9–10.3)
Chloride: 91 mmol/L — ABNORMAL LOW (ref 98–111)
Creatinine, Ser: 0.97 mg/dL (ref 0.44–1.00)
GFR, Estimated: 55 mL/min — ABNORMAL LOW (ref >60)
Glucose, Bld: 194 mg/dL — ABNORMAL HIGH (ref 70–99)
Potassium: 4.3 mmol/L (ref 3.5–5.1)
Sodium: 133 mmol/L — ABNORMAL LOW (ref 135–145)
Total Bilirubin: 1 mg/dL (ref 0.3–1.2)
Total Protein: 5.9 g/dL — ABNORMAL LOW (ref 6.5–8.1)

## 2022-03-27 LAB — MAGNESIUM: Magnesium: 2 mg/dL (ref 1.7–2.4)

## 2022-03-27 LAB — PROTIME-INR
INR: 1.1 (ref 0.8–1.2)
Prothrombin Time: 14.5 seconds (ref 11.4–15.2)

## 2022-03-27 LAB — AMMONIA: Ammonia: 26 umol/L (ref 9–35)

## 2022-03-27 LAB — BRAIN NATRIURETIC PEPTIDE: B Natriuretic Peptide: 1110.2 pg/mL — ABNORMAL HIGH (ref 0.0–100.0)

## 2022-03-27 MED ORDER — FUROSEMIDE 10 MG/ML IJ SOLN
60.0000 mg | Freq: Once | INTRAMUSCULAR | Status: AC
  Administered 2022-03-27: 60 mg via INTRAVENOUS
  Filled 2022-03-27: qty 6

## 2022-03-27 MED ORDER — ENOXAPARIN SODIUM 40 MG/0.4ML IJ SOSY
40.0000 mg | PREFILLED_SYRINGE | INTRAMUSCULAR | Status: DC
  Administered 2022-03-27 – 2022-03-29 (×3): 40 mg via SUBCUTANEOUS
  Filled 2022-03-27 (×3): qty 0.4

## 2022-03-27 MED ORDER — HALOPERIDOL LACTATE 5 MG/ML IJ SOLN: 1.0000 mg | Freq: Four times a day (QID) | INTRAMUSCULAR | Status: DC | PRN

## 2022-03-27 MED ORDER — FUROSEMIDE 40 MG PO TABS
40.0000 mg | ORAL_TABLET | Freq: Two times a day (BID) | ORAL | Status: DC
  Administered 2022-03-28 – 2022-03-30 (×5): 40 mg via ORAL
  Filled 2022-03-27 (×6): qty 1

## 2022-03-27 MED ORDER — TRAZODONE HCL 50 MG PO TABS
100.0000 mg | ORAL_TABLET | Freq: Every day | ORAL | Status: DC
  Administered 2022-03-27: 100 mg via ORAL
  Filled 2022-03-27: qty 2

## 2022-03-27 NOTE — Progress Notes (Signed)
I noticed with medications and water patient does cough I had to give apple sauce to help swallow. Daughter states that this has gotten worse since admission to hospital. She may benefit from SLP. Ilean Skill LPN

## 2022-03-27 NOTE — Progress Notes (Signed)
Nephrology Progress Note  Patient ID: Erin Herman, female   DOB: August 11, 1927, 88 y.o.   MRN: [id number]  Subjective:    She had 2 L uop over 12/1.  She got lasix 60 mg IV once yesterday.  Note that she was just started on trazodone per team.  Spoke with them - difficult situation.  Spoke with patient's daughter at bedside.   Review of systems: No n/v - ate breakfast today and yesterday  Shortness of breath a little better - still with cough Sleeping this am - had a rough night    O:BP 109/65 (BP Location: Left Arm)   Pulse (!) 54   Temp 98 F (36.7 C) (Oral)   Resp 17   Ht 5\' 4"  (1.626 m)   Wt 67.5 kg   LMP  (LMP Unknown)   SpO2 99%   BMI 25.54 kg/m   Intake/Output Summary (Last 24 hours) at 03/27/2022 1034 Last data filed at 03/27/2022 0253 Gross per 24 hour  Intake --  Output 1638 ml  Net -1638 ml   Intake/Output: I/O last 3 completed shifts: In: -  Out: 1988 [Urine:1988]  Intake/Output this shift:  No intake/output data recorded. Weight change:   General elderly female in bed in no acute distress      HEENT normocephalic atraumatic extraocular movements intact sclera anicteric Neck supple trachea midline Lungs reduced to auscultation bilaterally normal work of breathing at rest on room air Heart S1S2 no rub Abdomen soft nontender slightly distended Extremities 1+ edema appreciated  Neuro sleeping on my exam    Recent Labs  Lab 03/22/22 0529 03/22/22 0846 03/23/22 0411 03/23/22 0809 03/23/22 1213 03/23/22 1633 03/23/22 2033 03/24/22 0542 03/25/22 0228 03/26/22 0555 03/27/22 0326  NA 123*   < > 125*   < > 124* 124* 125* 126* 129* 131* 133*  K 4.6  --  4.6  --   --  4.4  --  4.0 4.5 4.8 4.3  CL 89*  --  88*  --   --  86*  --  91* 93* 92* 91*  CO2 23  --  25  --   --  25  --  26 26 30 31   GLUCOSE 117*  --  138*  --   --  111*  --  107* 137* 151* 194*  BUN 57*  --  57*  --   --  54*  --  47* 39* 33* 30*  CREATININE 1.56*  --  1.43*  --   --  1.45*   --  1.20* 1.03* 1.00 0.97  ALBUMIN  --   --  3.4*  --   --   --   --  2.9* 3.0* 3.2* 3.2*  CALCIUM 8.2*  --  8.6*  --   --  8.6*  --  8.1* 8.5* 9.0 9.4  AST  --   --  752*  --   --   --   --  431* 331* 215* 146*  ALT  --   --  892*  --   --   --   --  632* 560* 420* 314*   < > = values in this interval not displayed.   Liver Function Tests: Recent Labs  Lab 03/25/22 0228 03/26/22 0555 03/27/22 0326  AST 331* 215* 146*  ALT 560* 420* 314*  ALKPHOS 188* 172* 163*  BILITOT 0.7 0.6 1.0  PROT 5.6* 6.1* 5.9*  ALBUMIN 3.0* 3.2* 3.2*   No results for input(s): "  LIPASE", "AMYLASE" in the last 168 hours. Recent Labs  Lab 03/25/22 0228 03/27/22 0326  AMMONIA 35 26   CBC: Recent Labs  Lab 03/23/22 1037 03/23/22 1037 03/24/22 0542 03/25/22 0228 03/26/22 0555 03/27/22 0326  WBC 8.1  --  6.7 6.9 6.4 5.8  NEUTROABS  --    < > 4.2 4.9 4.2 3.7  HGB 11.3*  --  10.9* 10.9* 11.2* 11.0*  HCT 33.4*  --  31.3* 33.0* 34.3* 33.2*  MCV 94.4  --  94.3 97.1 98.0 96.8  PLT 224  --  200 205 205 238   < > = values in this interval not displayed.   Cardiac Enzymes: No results for input(s): "CKTOTAL", "CKMB", "CKMBINDEX", "TROPONINI" in the last 168 hours. CBG: Recent Labs  Lab 03/26/22 0842 03/26/22 1224 03/26/22 1637 03/26/22 2150 03/27/22 0747  GLUCAP 133* 107* 148* 131* 138*    Iron Studies: No results for input(s): "IRON", "TIBC", "TRANSFERRIN", "FERRITIN" in the last 72 hours. Studies/Results: No results found.  ALPRAZolam  0.5 mg Oral QHS   aspirin EC  81 mg Oral Daily   enoxaparin (LOVENOX) injection  40 mg Subcutaneous Q24H   famotidine  20 mg Oral Daily   feeding supplement  237 mL Oral TID BM   insulin aspart  0-9 Units Subcutaneous TID WC   levothyroxine  100 mcg Oral Q0600   metoprolol succinate  12.5 mg Oral Daily   multivitamin with minerals  1 tablet Oral Daily   polyethylene glycol  17 g Oral Daily   traZODone  50 mg Oral QHS    BMET    Component Value  Date/Time   NA 133 (L) 03/27/2022 0326   K 4.3 03/27/2022 0326   CL 91 (L) 03/27/2022 0326   CO2 31 03/27/2022 0326   GLUCOSE 194 (H) 03/27/2022 0326   BUN 30 (H) 03/27/2022 0326   CREATININE 0.97 03/27/2022 0326   CALCIUM 9.4 03/27/2022 0326   GFRNONAA 55 (L) 03/27/2022 0326   GFRAA 59 (L) 11/07/2018 0620   CBC    Component Value Date/Time   WBC 5.8 03/27/2022 0326   RBC 3.43 (L) 03/27/2022 0326   HGB 11.0 (L) 03/27/2022 0326   HCT 33.2 (L) 03/27/2022 0326   PLT 238 03/27/2022 0326   MCV 96.8 03/27/2022 0326   MCH 32.1 03/27/2022 0326   MCHC 33.1 03/27/2022 0326   RDW 14.6 03/27/2022 0326   LYMPHSABS 1.3 03/27/2022 0326   MONOABS 0.7 03/27/2022 0326   EOSABS 0.1 03/27/2022 0326   BASOSABS 0.0 03/27/2022 0326     Assessment/Plan:  Hyponatremia - Secondary to zoloft/SSRI (Last dose was 11/27).  Initially felt to be due to volume depletion (poor po intake with spironolactone and lasix).  Sodium was 119 on admission.  She was given tolvaptan 15 mg on 03/14/22 with significant diuresis and improvement of sodium however ultimately found to have cirrhosis on later imaging.  Zoloft stopped and restarted IV lasix due to edema.  Cirrhosis not felt to be playing significant role in Na per GI Lasix 60 mg IV once again today   Remain off of zoloft indefinitely  Will need to monitor carefully here and outpatient - she was just started on trazodone which can cause SIADH as well.  Not an ideal choice for her.  Team considering seroquel - defer agent to them.  Would see PCP within a week of discharge if discharged to home  Defer tolvaptan given liver function/cirrhosis Confirmed that her home lasix regimen  is 40 mg PO BID.  Resume lasix 40 mg PO BID tomorrow.  She is also typically on spironolactone 12.5 mg daily which we have held.  Would remain off for now  AKI/CKD stage III - possibly due to hypotension   resolving with supportive care - tolerating diuretics to optimize volume  status Acute on chronic combined systolic and diastolic CHF - diuretics as above  DM type 2 - per primary Hypotension - per daughter this is chronic. Transaminitis - improving. per primary team and GI.  Will avoid tolvaptan  Cirrhosis - per imaging, has cirrhotic morphology of the liver.  Appreciate GI  Disposition - per primary team.  Discussed with primary team and the patient's daughter.  Nephrology will sign off.  Thank you for the consult.  Please do not hesitate to contact me if you have any questions    Estanislado Emms, MD 11:04 AM 03/27/2022

## 2022-03-27 NOTE — Progress Notes (Addendum)
PROGRESS NOTE    Erin Herman  QRF:[id number] DOB: 10/20/1927 DOA: 03/10/2022 PCP: Kirstie Peri, MD   Brief Narrative:  88 y.o. female with medical history significant for chronic systolic and diastolic CHF, CABG, left bundle branch block, diabetes mellitus, hypertension presented with ongoing nausea.  She was diagnosed with UTI, dehydration, hyponatremia and admitted to [facility].  She was transferred to [facility] for further management of ongoing hyponatremia on 03/23/2022 for continued nephrology follow-up.  GI was consulted for elevated LFTs.  Palliative care also consulted for goals of care discussion.  Assessment & Plan:   Hyponatremia -Initial sodium was 119. -Nephrology following.  Sodium 133 today.  Has received IV Lasix daily for the last 3 days.  Diuretics as per nephrology. -Repeat a.m. labs.  Chronic systolic and diastolic heart failure -Last echo in 10/2018 had shown EF of 35% with grade 3 diastolic dysfunction -Strict input and output.  Daily weights.  Fluid restriction.  Diuretic management as per nephrology.  LFT elevation New diagnosis of possible cirrhosis of liver -LFTs improving.  Monitor.  Right upper quadrant ultrasound showed possible cirrhosis of liver, which is a new diagnosis.  GI signed off on 03/25/2022.  Outpatient follow-up with GI. -ammonia level normal  CAD with history of CABG and stenting -Stable.  No chest pain reported.  Outpatient follow-up with cardiology.  Continue aspirin and beta-blocker  Hypothyroidism -Continue levothyroxine  Anemia of chronic disease -From chronic illnesses.  Hemoglobin stable.  Monitor intermittently  Diabetes mellitus type 2 with hyperglycemia and hypoglycemia -Metformin on hold.  Continue CBGs with SSI.  A1c 6.6 few weeks ago.  Prolonged QT, chronic -Currently stable.  Essential hypertension -Blood pressure on the lower side.  Monitor.  Continue metoprolol.  Imdur on hold.  Goals of  care Failure to thrive Poor oral intake Generalized deconditioning -Patient has had extremely poor oral intake few weeks prior to presentation along with failure to thrive.  CODE STATUS has been changed to DNR after discussion with daughter on 03/24/2022.   -Palliative care following -PT recommending SNF placement.  Daughter interested in home with home health and outpatient palliative care.  TOC following. -Apparently patient was agitated more overnight.  Discussed with daughter and husband at bedside today: They are agreeable for Xanax to be removed completely.  Will increase dose of trazodone to 100 mg at bedtime.  Use Haldol as needed for agitation.  DVT prophylaxis: Lovenox Code Status: DNR Family Communication: Daughter and son-in-law at bedside  Disposition Plan: Status is: Inpatient Remains inpatient appropriate because: of severity of illness.  Possible discharge in 1 to 2 days home once cleared by nephrology    Consultants: Nephrology/GI/palliative care  Procedures: None  Antimicrobials: None   Subjective: Patient seen and examined at bedside.  Poor historian.  Sleepy, wakes up very slightly, still very slow to respond.  Daughter at bedside.  Oral intake remains poor.  No overnight fever, vomiting, diarrhea.  Patient was agitated overnight as per the daughter.   Objective: Vitals:   03/26/22 2008 03/27/22 0514 03/27/22 0744 03/27/22 0745  BP: 103/67 120/84 109/65   Pulse: 68 94 (!) 54   Resp: 17 17 17    Temp: (!) 97.5 F (36.4 C) 98.2 F (36.8 C) 98 F (36.7 C)   TempSrc: Oral  Oral   SpO2: 91% 93% (!) 72% 99%  Weight:      Height:        Intake/Output Summary (Last 24 hours) at 03/27/2022 0825 Last data  filed at 03/27/2022 0253 Gross per 24 hour  Intake --  Output 1988 ml  Net -1988 ml    Filed Weights   03/23/22 0504 03/24/22 0420 03/25/22 0447  Weight: 68.3 kg 68.2 kg 67.5 kg    Examination:  General: On room air currently.  Currently in no  distress.  Looks chronically ill and deconditioned. ENT/neck: No obvious JVD elevation or palpable thyromegaly  respiratory: Decreased breath sounds at bases bilaterally with scattered crackles  CVS: S1 and S2 are heard; rate controlled currently  abdominal: Soft, nontender, still distended; no organomegaly, normal bowel sounds are heard  extremities: Mild lower edema present; no clubbing  CNS: Sleepy, wakes up very slightly, still extremely slow to respond.  Poor historian.  No focal neurologic deficit.  Moving extremities.   Lymph: No obvious palpable lymphadenopathy noted  skin: No lesions or ecchymosis  psych: Not agitated currently.  Affect is flat  musculoskeletal: No obvious joint tenderness/erythema or swelling   Data Reviewed: I have personally reviewed following labs and imaging studies  CBC: Recent Labs  Lab 03/23/22 1037 03/24/22 0542 03/25/22 0228 03/26/22 0555 03/27/22 0326  WBC 8.1 6.7 6.9 6.4 5.8  NEUTROABS  --  4.2 4.9 4.2 3.7  HGB 11.3* 10.9* 10.9* 11.2* 11.0*  HCT 33.4* 31.3* 33.0* 34.3* 33.2*  MCV 94.4 94.3 97.1 98.0 96.8  PLT 224 200 205 205 238    Basic Metabolic Panel: Recent Labs  Lab 03/23/22 1037 03/23/22 1213 03/23/22 1633 03/23/22 2033 03/24/22 0542 03/25/22 0228 03/26/22 0555 03/27/22 0326  NA  --    < > 124* 125* 126* 129* 131* 133*  K  --   --  4.4  --  4.0 4.5 4.8 4.3  CL  --   --  86*  --  91* 93* 92* 91*  CO2  --   --  25  --  26 26 30 31   GLUCOSE  --   --  111*  --  107* 137* 151* 194*  BUN  --   --  54*  --  47* 39* 33* 30*  CREATININE  --   --  1.45*  --  1.20* 1.03* 1.00 0.97  CALCIUM  --   --  8.6*  --  8.1* 8.5* 9.0 9.4  MG 1.9  --   --   --  2.0 2.0 2.1 2.0   < > = values in this interval not displayed.    GFR: Estimated Creatinine Clearance: 34.9 mL/min (by C-G formula based on SCr of 0.97 mg/dL). Liver Function Tests: Recent Labs  Lab 03/23/22 0411 03/24/22 0542 03/25/22 0228 03/26/22 0555 03/27/22 0326  AST  752* 431* 331* 215* 146*  ALT 892* 632* 560* 420* 314*  ALKPHOS 260* 199* 188* 172* 163*  BILITOT 1.1 1.2 0.7 0.6 1.0  PROT 6.1* 5.3* 5.6* 6.1* 5.9*  ALBUMIN 3.4* 2.9* 3.0* 3.2* 3.2*    No results for input(s): "LIPASE", "AMYLASE" in the last 168 hours. Recent Labs  Lab 03/25/22 0228 03/27/22 0326  AMMONIA 35 26    Coagulation Profile: Recent Labs  Lab 03/23/22 1037 03/25/22 0228 03/26/22 0555 03/27/22 0326  INR 1.5* 1.2 1.1 1.1    Cardiac Enzymes: No results for input(s): "CKTOTAL", "CKMB", "CKMBINDEX", "TROPONINI" in the last 168 hours. BNP (last 3 results) No results for input(s): "PROBNP" in the last 8760 hours. HbA1C: No results for input(s): "HGBA1C" in the last 72 hours. CBG: Recent Labs  Lab 03/26/22 0842 03/26/22 1224 03/26/22 1637  03/26/22 2150 03/27/22 0747  GLUCAP 133* 107* 148* 131* 138*    Lipid Profile: No results for input(s): "CHOL", "HDL", "LDLCALC", "TRIG", "CHOLHDL", "LDLDIRECT" in the last 72 hours. Thyroid Function Tests: No results for input(s): "TSH", "T4TOTAL", "FREET4", "T3FREE", "THYROIDAB" in the last 72 hours. Anemia Panel: No results for input(s): "VITAMINB12", "FOLATE", "FERRITIN", "TIBC", "IRON", "RETICCTPCT" in the last 72 hours. Sepsis Labs: No results for input(s): "PROCALCITON", "LATICACIDVEN" in the last 168 hours.  No results found for this or any previous visit (from the past 240 hour(s)).       Radiology Studies: No results found.      Scheduled Meds:  ALPRAZolam  0.5 mg Oral QHS   aspirin EC  81 mg Oral Daily   enoxaparin (LOVENOX) injection  30 mg Subcutaneous Q24H   famotidine  20 mg Oral Daily   feeding supplement  237 mL Oral TID BM   insulin aspart  0-9 Units Subcutaneous TID WC   levothyroxine  100 mcg Oral Q0600   metoprolol succinate  12.5 mg Oral Daily   multivitamin with minerals  1 tablet Oral Daily   polyethylene glycol  17 g Oral Daily   traZODone  50 mg Oral QHS   Continuous  Infusions:          Glade Lloyd, MD [facility] 03/27/2022, 8:25 AM

## 2022-03-27 NOTE — Progress Notes (Signed)
Daily Progress Note   Patient Name: Erin Herman       Date: 03/27/2022 DOB: 1928-04-16  Age: 88 y.o. MRN#: [id number] Attending Physician: Glade Lloyd, MD Primary Care Physician: Kirstie Peri, MD Admit Date: 03/10/2022  Reason for Consultation/Follow-up: Establishing goals of care and Non pain symptom management  Subjective: Chart review performed. Unable to speak with primary RN for report. Per documentation, patient had another restless night. Xanax was stopped this morning, trazodone increased.   Went to visit patient at bedside - caregiver/Erin Herman present. Patient was lying in bed awake, alert, oriented to self, time, place but not situation, she is able to participate in simple conversation. Patient tells me she "slept well" last night. Endorses feeling anxious. No signs or non-verbal gestures of pain or discomfort noted. No respiratory distress, increased work of breathing, or secretions noted.   1:43 PM Called daughter Erin Herman - she tells me she was sleeping, has no needs, and requests further discussions tomorrow.  Patient may be having paradoxical reaction to xanax - agree with stopping. Detailed chart review performed - considering starting Buspar for patient's anxiety. Noted while patient was at [facility], buspar was started on 11/22 and discontinued 11/24. Chart review also reveals Erin Herman had questions regarding this medication on 11/24. No documentation to indicate why it was discontinued - before restarting, will discuss with Erin Herman. Buspar is not on patient's home med list. Starting Buspar discussed with Dr. Hanley Ben and pharmacist.   Length of Stay: 17  Current Medications: Scheduled Meds:   aspirin EC  81 mg Oral Daily   enoxaparin (LOVENOX) injection  40 mg Subcutaneous  Q24H   famotidine  20 mg Oral Daily   feeding supplement  237 mL Oral TID BM   [START ON 03/28/2022] furosemide  40 mg Oral BID   insulin aspart  0-9 Units Subcutaneous TID WC   levothyroxine  100 mcg Oral Q0600   metoprolol succinate  12.5 mg Oral Daily   multivitamin with minerals  1 tablet Oral Daily   polyethylene glycol  17 g Oral Daily   traZODone  100 mg Oral QHS    Continuous Infusions:   PRN Meds: acetaminophen **OR** acetaminophen, alum & mag hydroxide-simeth, fentaNYL (SUBLIMAZE) injection, guaiFENesin, levalbuterol, lidocaine, loperamide, Muscle Rub, mouth rinse, oxyCODONE, promethazine  Physical Exam Vitals and nursing note reviewed.  Constitutional:      General: She is not in acute distress. Pulmonary:     Effort: No respiratory distress.  Skin:    General: Skin is warm and dry.  Neurological:     Mental Status: She is alert and oriented to person, place, and time. She is confused.     Motor: Weakness present.  Psychiatric:        Attention and Perception: Attention normal.        Behavior: Behavior is cooperative.        Cognition and Memory: Cognition normal. Memory is impaired.             Vital Signs: BP 109/65 (BP Location: Left Arm)   Pulse (!) 54   Temp 98 F (36.7 C) (Oral)   Resp 17   Ht 5\' 4"  (1.626 m)   Wt 67.5 kg   LMP  (LMP Unknown)   SpO2 99%   BMI 25.54 kg/m  SpO2: SpO2: 99 % O2 Device: O2 Device: Room Air O2 Flow Rate: O2 Flow Rate (L/min): 2 L/min  Intake/output summary:  Intake/Output Summary (Last 24 hours) at 03/27/2022 1329 Last data filed at 03/27/2022 0253 Gross per 24 hour  Intake --  Output 888 ml  Net -888 ml   LBM: Last BM Date : 03/26/22 Baseline Weight: Weight: 56.2 kg Most recent weight: Weight: 67.5 kg       Palliative Assessment/Data: PPS 20-30%      Patient Active Problem List   Diagnosis Date Noted   Protein-calorie malnutrition, severe 03/24/2022   Cirrhosis of liver without ascites (HCC)  03/24/2022   Elevated alanine aminotransferase (ALT) level 03/24/2022   Hyponatremia 03/10/2022   Prolonged QT interval 03/10/2022   Combined systolic and diastolic congestive heart failure (HCC) 03/10/2022   Hypothyroidism 11/23/2021   Leukocytosis 11/23/2021   Depression 07/29/2021   Hypokalemia 07/29/2021   GERD (gastroesophageal reflux disease) 12/29/2020   Chest pain 11/06/2020   Chronic systolic (congestive) heart failure (HCC)    Status post left hip replacement 04/29/2020   NSTEMI (non-ST elevated myocardial infarction) (HCC) 11/04/2018   Dysphagia 05/10/2018   Diabetes (HCC) 02/27/2018   S/P right THA, AA 03/01/2017   Postoperative anemia due to acute blood loss 05/22/2014   DJD (degenerative joint disease) of knee 05/20/2014   Primary localized osteoarthritis of left knee 04/30/2014   Preoperative cardiovascular examination 04/04/2014   Chronic diastolic heart failure (HCC) 07/03/2012   CAROTID ARTERY DISEASE 12/09/2009   Essential hypertension, benign 08/05/2008   CAD, NATIVE VESSEL 08/05/2008   Secondary cardiomyopathy (HCC) 08/05/2008   Aortic valve disorder 08/05/2008   Hyperlipidemia 08/03/2008    Palliative Care Assessment & Plan   Patient Profile: 88 y.o. female  with past medical history of systolic and diastolic CHF, CABG, left bundle branch block, diabetes mellitus, hypertension  admitted on 03/10/2022 with hyponatremia, AKI, dehydration and UTI.    Patient was transferred to [facility] on 03/23/2022 for further management of ongoing hyponatremia.  Nephrology is following.  GI also following for elevated LFTs and new imaging is concerning for cirrhosis. PMT has been consulted to assist with goals of care conversation.  Assessment: Principal Problem:   Hyponatremia Active Problems:   Essential hypertension, benign   CAD, NATIVE VESSEL   Diabetes (HCC)   Depression   Hypokalemia   Prolonged QT interval   Combined systolic and diastolic  congestive heart failure (HCC)   Protein-calorie malnutrition, severe   Cirrhosis of liver without ascites (HCC)  Elevated alanine aminotransferase (ALT) level   Concern about end of life  Recommendations/Plan: Continue supportive treatment Continue DNR/DNI as previously documented Goal is for patient to return home with [facility] and outpatient Palliative Care to follow Possible paradoxical reaction to xanax - agree with stopping and increasing trazodone. Will discuss starting Buspar for anxiety with patient's daughter tomorrow 12/3 PMT will continue to follow and support holistically  Goals of Care and Additional Recommendations: Limitations on Scope of Treatment: Minimize Medications, Full Scope Treatment, No Artificial Feeding, and No Tracheostomy  Code Status:    Code Status Orders  (From admission, onward)           Start     Ordered   03/24/22 0931  Do not attempt resuscitation (DNR)  Continuous       Question Answer Comment  In the event of cardiac or respiratory ARREST Do not call a "code blue"   In the event of cardiac or respiratory ARREST Do not perform Intubation, CPR, defibrillation or ACLS   In the event of cardiac or respiratory ARREST Use medication by any route, position, wound care, and other measures to relive pain and suffering. May use oxygen, suction and manual treatment of airway obstruction as needed for comfort.      03/24/22 0930           Code Status History     Date Active Date Inactive Code Status Order ID Comments User Context   03/10/2022 1930 03/24/2022 0930 Full Code [id number]  Bethena Roys, MD Inpatient   11/23/2021 0145 11/23/2021 2214 Full Code [id number]  [place], [place], DO ED   07/29/2021 0611 07/30/2021 1826 Partial Code [id number]  [place], [place], DO ED   07/28/2021 2330 07/29/2021 0611 Full Code [id number]  Zierle-Ghosh, [place], DO ED   12/29/2020 2133 12/30/2020 2045 Full Code [id number]  B[place], [place] B, DO Inpatient    11/06/2020 1504 11/08/2020 1903 Full Code [id number]  Roxan Hockey, MD ED   04/29/2020 1302 04/30/2020 2229 Full Code [id number]  Norman Herrlich Inpatient   11/04/2018 2126 11/07/2018 2141 Full Code [id number]  Elwyn Reach, MD Inpatient   03/01/2017 1533 03/02/2017 1943 Full Code [id number]  Norman Herrlich Inpatient   05/20/2014 1144 05/22/2014 1515 Full Code [id number]  Linda Hedges, PA-C Inpatient      Advance Directive Documentation    Flowsheet Row Most Recent Value  Type of Advance Directive Healthcare Power of Attorney, Living will  Pre-existing out of facility DNR order (yellow form or pink MOST form) --  "MOST" Form in Place? --       Prognosis: Overall poor in the setting of advanced age, recurrent hospitalizations, and multiple comorbidities  Discharge Planning: Home with Palliative Services and [place] was discussed with patient, patient's caregiver, Dr. Wilkie Aye Powell/Pharmacist  Thank you for allowing the Palliative Medicine Team to assist in the care of this patient.   Total Time 50 minutes Prolonged Time Billed  no       Greater than 50%  of this time was spent counseling and coordinating care related to the above assessment and plan.  Lin Landsman, NP  Please contact Palliative Medicine Team phone at [phone] for questions and concerns.   *Portions of this note are a verbal dictation therefore any spelling and/or grammatical errors are due to the "[place] [employer]" system interpretation.

## 2022-03-27 NOTE — Plan of Care (Signed)
°  Problem: Education: °Goal: Knowledge of General Education information will improve °Description: Including pain rating scale, medication(s)/side effects and non-pharmacologic comfort measures °Outcome: Progressing °  °Problem: Health Behavior/Discharge Planning: °Goal: Ability to manage health-related needs will improve °Outcome: Progressing °  °Problem: Clinical Measurements: °Goal: Ability to maintain clinical measurements within normal limits will improve °Outcome: Progressing °Goal: Will remain free from infection °Outcome: Progressing °Goal: Diagnostic test results will improve °Outcome: Progressing °Goal: Cardiovascular complication will be avoided °Outcome: Progressing °  °Problem: Activity: °Goal: Risk for activity intolerance will decrease °Outcome: Progressing °  °Problem: Nutrition: °Goal: Adequate nutrition will be maintained °Outcome: Progressing °  °Problem: Coping: °Goal: Level of anxiety will decrease °Outcome: Progressing °  °Problem: Elimination: °Goal: Will not experience complications related to bowel motility °Outcome: Progressing °  °Problem: Safety: °Goal: Ability to remain free from injury will improve °Outcome: Progressing °  °Problem: Skin Integrity: °Goal: Risk for impaired skin integrity will decrease °Outcome: Progressing °  °

## 2022-03-28 DIAGNOSIS — E876 Hypokalemia: Secondary | ICD-10-CM | POA: Diagnosis not present

## 2022-03-28 DIAGNOSIS — E871 Hypo-osmolality and hyponatremia: Secondary | ICD-10-CM | POA: Diagnosis not present

## 2022-03-28 DIAGNOSIS — I2510 Atherosclerotic heart disease of native coronary artery without angina pectoris: Secondary | ICD-10-CM

## 2022-03-28 DIAGNOSIS — E1122 Type 2 diabetes mellitus with diabetic chronic kidney disease: Secondary | ICD-10-CM

## 2022-03-28 DIAGNOSIS — K7460 Unspecified cirrhosis of liver: Secondary | ICD-10-CM | POA: Diagnosis not present

## 2022-03-28 DIAGNOSIS — R110 Nausea: Secondary | ICD-10-CM | POA: Diagnosis not present

## 2022-03-28 DIAGNOSIS — N183 Chronic kidney disease, stage 3 unspecified: Secondary | ICD-10-CM

## 2022-03-28 DIAGNOSIS — F32A Depression, unspecified: Secondary | ICD-10-CM

## 2022-03-28 LAB — CBC WITH DIFFERENTIAL/PLATELET
Abs Immature Granulocytes: 0.01 10*3/uL (ref 0.00–0.07)
Basophils Absolute: 0 10*3/uL (ref 0.0–0.1)
Basophils Relative: 1 %
Eosinophils Absolute: 0 10*3/uL (ref 0.0–0.5)
Eosinophils Relative: 1 %
HCT: 33.1 % — ABNORMAL LOW (ref 36.0–46.0)
Hemoglobin: 11.3 g/dL — ABNORMAL LOW (ref 12.0–15.0)
Immature Granulocytes: 0 %
Lymphocytes Relative: 19 %
Lymphs Abs: 1.1 10*3/uL (ref 0.7–4.0)
MCH: 32.6 pg (ref 26.0–34.0)
MCHC: 34.1 g/dL (ref 30.0–36.0)
MCV: 95.4 fL (ref 80.0–100.0)
Monocytes Absolute: 0.6 10*3/uL (ref 0.1–1.0)
Monocytes Relative: 10 %
Neutro Abs: 4.2 10*3/uL (ref 1.7–7.7)
Neutrophils Relative %: 69 %
Platelets: 232 10*3/uL (ref 150–400)
RBC: 3.47 MIL/uL — ABNORMAL LOW (ref 3.87–5.11)
RDW: 14.6 % (ref 11.5–15.5)
WBC: 6 10*3/uL (ref 4.0–10.5)
nRBC: 0 % (ref 0.0–0.2)

## 2022-03-28 LAB — PROTIME-INR
INR: 1.1 (ref 0.8–1.2)
Prothrombin Time: 14.5 seconds (ref 11.4–15.2)

## 2022-03-28 LAB — COMPREHENSIVE METABOLIC PANEL
ALT: 248 U/L — ABNORMAL HIGH (ref 0–44)
AST: 101 U/L — ABNORMAL HIGH (ref 15–41)
Albumin: 3.3 g/dL — ABNORMAL LOW (ref 3.5–5.0)
Alkaline Phosphatase: 147 U/L — ABNORMAL HIGH (ref 38–126)
Anion gap: 11 (ref 5–15)
BUN: 30 mg/dL — ABNORMAL HIGH (ref 8–23)
CO2: 31 mmol/L (ref 22–32)
Calcium: 9.2 mg/dL (ref 8.9–10.3)
Chloride: 92 mmol/L — ABNORMAL LOW (ref 98–111)
Creatinine, Ser: 1.07 mg/dL — ABNORMAL HIGH (ref 0.44–1.00)
GFR, Estimated: 49 mL/min — ABNORMAL LOW (ref >60)
Glucose, Bld: 155 mg/dL — ABNORMAL HIGH (ref 70–99)
Potassium: 4.1 mmol/L (ref 3.5–5.1)
Sodium: 134 mmol/L — ABNORMAL LOW (ref 135–145)
Total Bilirubin: 1.1 mg/dL (ref 0.3–1.2)
Total Protein: 6 g/dL — ABNORMAL LOW (ref 6.5–8.1)

## 2022-03-28 LAB — GLUCOSE, CAPILLARY
Glucose-Capillary: 146 mg/dL — ABNORMAL HIGH (ref 70–99)
Glucose-Capillary: 164 mg/dL — ABNORMAL HIGH (ref 70–99)
Glucose-Capillary: 123 mg/dL — ABNORMAL HIGH (ref 70–99)

## 2022-03-28 LAB — MAGNESIUM: Magnesium: 2 mg/dL (ref 1.7–2.4)

## 2022-03-28 MED ORDER — TRAZODONE HCL 50 MG PO TABS
150.0000 mg | ORAL_TABLET | Freq: Every day | ORAL | Status: DC
  Administered 2022-03-28 – 2022-03-29 (×2): 150 mg via ORAL
  Filled 2022-03-28 (×2): qty 3

## 2022-03-28 MED ORDER — BUSPIRONE HCL 5 MG PO TABS
7.5000 mg | ORAL_TABLET | Freq: Two times a day (BID) | ORAL | Status: DC
  Administered 2022-03-28 – 2022-03-30 (×5): 7.5 mg via ORAL
  Filled 2022-03-28 (×5): qty 2

## 2022-03-28 NOTE — Progress Notes (Addendum)
[facility]  PROGRESS NOTE  Erin Herman DJS:[id number] DOB: [birth year] DOA: 03/10/2022 PCP: Erin Peri, MD   Brief HPI:   88 year old female with medical history of chronic systolic and diastolic CHF, status post CABG, left bundle branch block, diabetes mellitus type 2, hypertension presented with nausea.  She was diagnosed with UTI, dehydration, hyponatremia and admitted to [facility].  She was transferred to [facility] for further management for ongoing hyponatremia.  Nephrology was consulted.  GI was consulted for elevated LFTs.  Also palliative care was consulted for goals of care discussion.    Subjective   Patient seen and examined, denies any pain.  No shortness of breath.  As per daughter patient only slept for 3 hours last night however it was better than Xanax.  She wants dose of trazodone to be increased.   Assessment/Plan:    Hyponatremia -Initial sodium was 119; secondary to SSRI -Nephrology was consulted -Patient also received tolvaptan 15 mg with significant diuresis and improvement of sodium -Zoloft was stopped and patient started on IV Lasix -started on IV Lasix daily for 3 days -Sodium has improved to 134 today -Started on Lasix 40 mg p.o. twice daily -Aldactone 12.5 mg currently on hold  Acute kidney injury -Likely from hypotension -Creatinine has improved with diuresis  Chronic systolic and diastolic CHF -Currently euvolemic -Continue Lasix 40 mg p.o. twice daily  Transaminitis -Presented with elevated LFTs -Right upper quadrant ultrasound showed possible cirrhosis of liver, new diagnosis -GI was consulted; as per GI patient cirrhosis appears to be minimal with no evidence of portal hypertension or synthetic dysfunction -No further intervention recommended, GI signed off - follow-up GI as outpatient  Diabetes mellitus type 2 -CBG well controlled -Continue sliding scale insulin with NovoLog  Hypothyroidism -Continue  Synthroid  Hypertension -Blood pressure is stable -Continue metoprolol  Agitation/insomnia -Patient's SSRI was stopped due to hyponatremia -She was started on Xanax; which was stopped due to agitation -Patient started on trazodone 100 mg nightly -As per daughter she slept for 3 hours only -Will increase dose of trazodone to 150 mg nightly  Medications     aspirin EC  81 mg Oral Daily   busPIRone  7.5 mg Oral BID   enoxaparin (LOVENOX) injection  40 mg Subcutaneous Q24H   famotidine  20 mg Oral Daily   feeding supplement  237 mL Oral TID BM   furosemide  40 mg Oral BID   insulin aspart  0-9 Units Subcutaneous TID WC   levothyroxine  100 mcg Oral Q0600   metoprolol succinate  12.5 mg Oral Daily   multivitamin with minerals  1 tablet Oral Daily   polyethylene glycol  17 g Oral Daily   traZODone  150 mg Oral QHS     Data Reviewed:   CBG:  Recent Labs  Lab 03/27/22 0747 03/27/22 1144 03/27/22 1641 03/28/22 0731 03/28/22 1213  GLUCAP 138* 132* 168* 146* 164*    SpO2: 98 % O2 Flow Rate (L/min): 2 L/min    Vitals:   03/27/22 0745 03/27/22 1639 03/27/22 2033 03/28/22 0600  BP:  107/63 110/83 129/78  Pulse:   97 99  Resp:  16    Temp:  98 F (36.7 C) 98 F (36.7 C) 98 F (36.7 C)  TempSrc:  Oral Oral Oral  SpO2: 99%  98% 98%  Weight:      Height:          Data Reviewed:  Basic Metabolic Panel: Recent Labs  Lab 03/24/22  7782 03/25/22 0228 03/26/22 0555 03/27/22 0326 03/28/22 0248  NA 126* 129* 131* 133* 134*  K 4.0 4.5 4.8 4.3 4.1  CL 91* 93* 92* 91* 92*  CO2 26 26 30 31 31   GLUCOSE 107* 137* 151* 194* 155*  BUN 47* 39* 33* 30* 30*  CREATININE 1.20* 1.03* 1.00 0.97 1.07*  CALCIUM 8.1* 8.5* 9.0 9.4 9.2  MG 2.0 2.0 2.1 2.0 2.0    CBC: Recent Labs  Lab 03/24/22 0542 03/25/22 0228 03/26/22 0555 03/27/22 0326 03/28/22 0248  WBC 6.7 6.9 6.4 5.8 6.0  NEUTROABS 4.2 4.9 4.2 3.7 4.2  HGB 10.9* 10.9* 11.2* 11.0* 11.3*  HCT 31.3* 33.0* 34.3*  33.2* 33.1*  MCV 94.3 97.1 98.0 96.8 95.4  PLT 200 205 205 238 232    LFT Recent Labs  Lab 03/24/22 0542 03/25/22 0228 03/26/22 0555 03/27/22 0326 03/28/22 0248  AST 431* 331* 215* 146* 101*  ALT 632* 560* 420* 314* 248*  ALKPHOS 199* 188* 172* 163* 147*  BILITOT 1.2 0.7 0.6 1.0 1.1  PROT 5.3* 5.6* 6.1* 5.9* 6.0*  ALBUMIN 2.9* 3.0* 3.2* 3.2* 3.3*     Antibiotics: Anti-infectives (From admission, onward)    None        DVT prophylaxis: Lovenox  Code Status: DNR  Family Communication: Discussed with patient daughter on phone   CONSULTS    Objective    Physical Examination:   General: Appears in no acute distress Cardiovascular: S1-S2, regular, no murmur auscultated Respiratory: Clear to auscultation bilaterally Abdomen: Soft, nontender, no organomegaly Extremities: Trace edema in the lower extremities Neurologic: Alert, following commands, no focal deficit noted   Status is: Inpatient:             Erin Herman S Erin Herman   [facility] If 7PM-7AM, please contact night-coverage at [email or web address], Office  [phone]   03/28/2022, 2:43 PM  LOS: 18 days

## 2022-03-28 NOTE — Progress Notes (Signed)
Daily Progress Note   Patient Name: Erin Herman       Date: 03/28/2022 DOB: [birth year]  Age: 88 y.o. MRN#: [id number] Attending Physician: Meredeth Ide, MD Primary Care Physician: Kirstie Peri, MD Admit Date: 03/10/2022  Reason for Consultation/Follow-up: Non pain symptom management  Subjective: Chart review performed. Received report from primary RN - no acute concerns.  Went to visit patient at bedside - caregiver/[name] present. Patient was lying in bed awake, alert, able to participate in conversation. No signs or non-verbal gestures of pain or discomfort noted. No respiratory distress, increased work of breathing, or secretions noted. Patient tells me she is "just fine" but "didn't sleep too well" - when questions, she states "there is a lot going on." She denies pain; endorses anxiety.  12:15 PM Called [name] - emotional support provided. [name] stayed with patient again overnight. Per [name], "last night was better" but reports patient still didn't sleep much, only about 3 hours. She has spoken with attending who is going to increase trazodone again for tonight. Discussed with [name] initiating Buspar for anxiety; discussed anxiety could be contributing to insomnia. Reviewed information per chart review on buspar previously being started/discontinued - [name] does not remember patient being on this medication/it being discontinued. Detailed medication education completed and she is ok with adding Buspar. Reviewed recommended initial dose for anxiety but that there is room to increase if needed.   Discussed discharge goals to include home with [facility] and outpatient Palliative Care.  All questions and concerns addressed. Encouraged to call with questions and/or concerns. PMT card  provided.  1:07 PM Received notification [name] called with questions about [facility] and pharmacy affiliations. Notified [facility] liaison and TOC of her questions - they will discuss with [name].   Length of Stay: 18  Current Medications: Scheduled Meds:   aspirin EC  81 mg Oral Daily   enoxaparin (LOVENOX) injection  40 mg Subcutaneous Q24H   famotidine  20 mg Oral Daily   feeding supplement  237 mL Oral TID BM   furosemide  40 mg Oral BID   insulin aspart  0-9 Units Subcutaneous TID WC   levothyroxine  100 mcg Oral Q0600   metoprolol succinate  12.5 mg Oral Daily   multivitamin with minerals  1 tablet Oral Daily   polyethylene glycol  17 g Oral Daily   traZODone  100 mg Oral QHS    Continuous Infusions:   PRN Meds: acetaminophen **OR** acetaminophen, alum & mag hydroxide-simeth, fentaNYL (SUBLIMAZE) injection, guaiFENesin, haloperidol lactate, levalbuterol, lidocaine, loperamide, Muscle Rub, mouth rinse, oxyCODONE, promethazine  Physical Exam Vitals and nursing note reviewed.  Constitutional:      General: She is not in acute distress. Pulmonary:     Effort: No respiratory distress.  Skin:    General: Skin is warm and dry.  Neurological:     Mental Status: She is alert and oriented to person, place, and time. She is confused.     Motor: Weakness present.  Psychiatric:        Attention and Perception: Attention normal.        Behavior: Behavior is cooperative.        Cognition and Memory: Cognition normal. Memory is impaired.             Vital Signs: BP 129/78 (BP Location: Left Arm)   Pulse 99   Temp 98 F (36.7 C) (Oral)   Resp 16   Ht 5\' 4"  (1.626 m)   Wt 67.5 kg   LMP  (LMP Unknown)   SpO2 98%   BMI 25.54 kg/m  SpO2: SpO2: 98 % O2 Device: O2 Device: Room Air O2 Flow Rate: O2 Flow Rate (L/min): 2 L/min  Intake/output summary:  Intake/Output Summary (Last 24 hours) at 03/28/2022 1223 Last data filed at 03/28/2022 0600 Gross per 24 hour   Intake 120 ml  Output 401 ml  Net -281 ml   LBM: Last BM Date : 03/28/22 Baseline Weight: Weight: 56.2 kg Most recent weight: Weight: 67.5 kg       Palliative Assessment/Data: PPS 20-30%      Patient Active Problem List   Diagnosis Date Noted   Protein-calorie malnutrition, severe 03/24/2022   Cirrhosis of liver without ascites (HCC) 03/24/2022   Elevated alanine aminotransferase (ALT) level 03/24/2022   Hyponatremia 03/10/2022   Prolonged QT interval 03/10/2022   Combined systolic and diastolic congestive heart failure (HCC) 03/10/2022   Hypothyroidism 11/23/2021   Leukocytosis 11/23/2021   Depression 07/29/2021   Hypokalemia 07/29/2021   GERD (gastroesophageal reflux disease) 12/29/2020   Chest pain 11/06/2020   Chronic systolic (congestive) heart failure (HCC)    Status post left hip replacement 04/29/2020   NSTEMI (non-ST elevated myocardial infarction) (HCC) 11/04/2018   Dysphagia 05/10/2018   Diabetes (HCC) 02/27/2018   S/P right THA, AA 03/01/2017   Postoperative anemia due to acute blood loss 05/22/2014   DJD (degenerative joint disease) of knee 05/20/2014   Primary localized osteoarthritis of left knee 04/30/2014   Preoperative cardiovascular examination 04/04/2014   Chronic diastolic heart failure (HCC) 07/03/2012   CAROTID ARTERY DISEASE 12/09/2009   Essential hypertension, benign 08/05/2008   CAD, NATIVE VESSEL 08/05/2008   Secondary cardiomyopathy (HCC) 08/05/2008   Aortic valve disorder 08/05/2008   Hyperlipidemia 08/03/2008    Palliative Care Assessment & Plan   Patient Profile: 88 y.o. female  with past medical history of systolic and diastolic CHF, CABG, left bundle branch block, diabetes mellitus, hypertension  admitted on 03/10/2022 with hyponatremia, AKI, dehydration and UTI.    Patient was transferred to [facility] on 03/23/2022 for further management of ongoing hyponatremia.  Nephrology is following.  GI also following for elevated  LFTs and new imaging is concerning for cirrhosis. PMT has been consulted to assist with goals of care conversation.  Assessment: Principal Problem:  Hyponatremia Active Problems:   Essential hypertension, benign   CAD, NATIVE VESSEL   Diabetes (HCC)   Depression   Hypokalemia   Prolonged QT interval   Combined systolic and diastolic congestive heart failure (HCC)   Protein-calorie malnutrition, severe   Cirrhosis of liver without ascites (HCC)   Elevated alanine aminotransferase (ALT) level   Concern about end of life  Recommendations/Plan: Continue gentle supportive treatment Continue DNR/DNI as previously documented Goal for discharge remains home with [facility] and outpatient Palliative Care to follow Started Buspar 7.5mg  BID for anxiety; can increase every 2 to 3 days as needed to max recommended dose PMT will continue to follow and support holistically  Goals of Care and Additional Recommendations: Limitations on Scope of Treatment: Full Scope Treatment and No Tracheostomy  Code Status:    Code Status Orders  (From admission, onward)           Start     Ordered   03/24/22 0931  Do not attempt resuscitation (DNR)  Continuous       Question Answer Comment  In the event of cardiac or respiratory ARREST Do not call a "code blue"   In the event of cardiac or respiratory ARREST Do not perform Intubation, CPR, defibrillation or ACLS   In the event of cardiac or respiratory ARREST Use medication by any route, position, wound care, and other measures to relive pain and suffering. May use oxygen, suction and manual treatment of airway obstruction as needed for comfort.      03/24/22 0930           Code Status History     Date Active Date Inactive Code Status Order ID Comments User Context   03/10/2022 1930 03/24/2022 0930 Full Code [id number]  Onnie Boer, MD Inpatient   11/23/2021 0145 11/23/2021 2214 Full Code [id number]  Zierle-Ghosh, Asia B, DO ED   07/29/2021  0611 07/30/2021 1826 Partial Code [id number]  Zierle-Ghosh, Asia B, DO ED   07/28/2021 2330 07/29/2021 0611 Full Code [id number]  Zierle-Ghosh, Asia B, DO ED   12/29/2020 2133 12/30/2020 2045 Full Code [id number]  Zierle-Ghosh, Asia B, DO Inpatient   11/06/2020 1504 11/08/2020 1903 Full Code [id number]  Shon Hale, MD ED   04/29/2020 1302 04/30/2020 2229 Full Code [id number]  Shelly Coss Inpatient   11/04/2018 2126 11/07/2018 2141 Full Code [id number]  Rometta Emery, MD Inpatient   03/01/2017 1533 03/02/2017 1943 Full Code [id number]  Shelly Coss Inpatient   05/20/2014 1144 05/22/2014 1515 Full Code [id number]  Pascal Lux, PA-C Inpatient      Advance Directive Documentation    Flowsheet Row Most Recent Value  Type of Advance Directive Healthcare Power of Attorney, Living will  Pre-existing out of facility DNR order (yellow form or pink MOST form) --  "MOST" Form in Place? --       Prognosis:  Unable to determine  Discharge Planning: Home with Home Health and outpatient Palliative Care  Care plan was discussed with primary RN, patient, patient's daughter  Thank you for allowing the Palliative Medicine Team to assist in the care of this patient.   Total Time 50 minutes Prolonged Time Billed  no       Greater than 50%  of this time was spent counseling and coordinating care related to the above assessment and plan.  Haskel Khan, NP  Please contact Palliative Medicine Team phone at [phone] for questions and concerns.   *Portions of this note are a  verbal dictation therefore any spelling and/or grammatical errors are due to the "[place] [employer]" system interpretation.

## 2022-03-29 DIAGNOSIS — R7401 Elevation of levels of liver transaminase levels: Secondary | ICD-10-CM | POA: Diagnosis not present

## 2022-03-29 DIAGNOSIS — I5042 Chronic combined systolic (congestive) and diastolic (congestive) heart failure: Secondary | ICD-10-CM | POA: Diagnosis not present

## 2022-03-29 DIAGNOSIS — E871 Hypo-osmolality and hyponatremia: Secondary | ICD-10-CM | POA: Diagnosis not present

## 2022-03-29 DIAGNOSIS — I10 Essential (primary) hypertension: Secondary | ICD-10-CM | POA: Diagnosis not present

## 2022-03-29 LAB — BASIC METABOLIC PANEL
Anion gap: 11 (ref 5–15)
BUN: 36 mg/dL — ABNORMAL HIGH (ref 8–23)
CO2: 31 mmol/L (ref 22–32)
Calcium: 8.5 mg/dL — ABNORMAL LOW (ref 8.9–10.3)
Chloride: 90 mmol/L — ABNORMAL LOW (ref 98–111)
Creatinine, Ser: 1.24 mg/dL — ABNORMAL HIGH (ref 0.44–1.00)
GFR, Estimated: 41 mL/min — ABNORMAL LOW (ref >60)
Glucose, Bld: 178 mg/dL — ABNORMAL HIGH (ref 70–99)
Potassium: 3.9 mmol/L (ref 3.5–5.1)
Sodium: 132 mmol/L — ABNORMAL LOW (ref 135–145)

## 2022-03-29 LAB — COMPREHENSIVE METABOLIC PANEL WITH GFR
ALT: 189 U/L — ABNORMAL HIGH (ref 0–44)
AST: 74 U/L — ABNORMAL HIGH (ref 15–41)
Albumin: 3.2 g/dL — ABNORMAL LOW (ref 3.5–5.0)
Alkaline Phosphatase: 140 U/L — ABNORMAL HIGH (ref 38–126)
Anion gap: 10 (ref 5–15)
BUN: 32 mg/dL — ABNORMAL HIGH (ref 8–23)
CO2: 28 mmol/L (ref 22–32)
Calcium: 8.8 mg/dL — ABNORMAL LOW (ref 8.9–10.3)
Chloride: 91 mmol/L — ABNORMAL LOW (ref 98–111)
Creatinine, Ser: 1.07 mg/dL — ABNORMAL HIGH (ref 0.44–1.00)
GFR, Estimated: 49 mL/min — ABNORMAL LOW
Glucose, Bld: 145 mg/dL — ABNORMAL HIGH (ref 70–99)
Potassium: 4 mmol/L (ref 3.5–5.1)
Sodium: 129 mmol/L — ABNORMAL LOW (ref 135–145)
Total Bilirubin: 0.9 mg/dL (ref 0.3–1.2)
Total Protein: 6.1 g/dL — ABNORMAL LOW (ref 6.5–8.1)

## 2022-03-29 LAB — PROTIME-INR
INR: 1.2 (ref 0.8–1.2)
Prothrombin Time: 15.5 s — ABNORMAL HIGH (ref 11.4–15.2)

## 2022-03-29 LAB — GLUCOSE, CAPILLARY
Glucose-Capillary: 128 mg/dL — ABNORMAL HIGH (ref 70–99)
Glucose-Capillary: 181 mg/dL — ABNORMAL HIGH (ref 70–99)

## 2022-03-29 MED ORDER — TRAZODONE HCL 50 MG PO TABS: 50.0000 mg | ORAL_TABLET | Freq: Every evening | ORAL | Status: DC | PRN

## 2022-03-29 MED ORDER — ONDANSETRON HCL 4 MG/2ML IJ SOLN: 4.0000 mg | Freq: Four times a day (QID) | INTRAMUSCULAR | Status: DC | PRN

## 2022-03-29 MED ORDER — METOPROLOL TARTRATE 5 MG/5ML IV SOLN: 5.0000 mg | INTRAVENOUS | Status: DC | PRN

## 2022-03-29 MED ORDER — HYDRALAZINE HCL 20 MG/ML IJ SOLN: 10.0000 mg | INTRAMUSCULAR | Status: DC | PRN

## 2022-03-29 MED ORDER — PSYLLIUM 95 % PO PACK
1.0000 | PACK | Freq: Every day | ORAL | Status: DC
  Administered 2022-03-29 – 2022-03-30 (×2): 1 via ORAL
  Filled 2022-03-29 (×2): qty 1

## 2022-03-29 MED ORDER — GUAIFENESIN 100 MG/5ML PO LIQD: 5.0000 mL | ORAL | Status: DC | PRN

## 2022-03-29 MED ORDER — SENNOSIDES-DOCUSATE SODIUM 8.6-50 MG PO TABS: 1.0000 | ORAL_TABLET | Freq: Every evening | ORAL | Status: DC | PRN

## 2022-03-29 NOTE — Progress Notes (Signed)
PROGRESS NOTE    Erin Herman  VFI:[id number] DOB: [birth year] DOA: 03/10/2022 PCP: Kirstie Peri, MD   Brief Narrative:   88 year old female with medical history of chronic systolic and diastolic CHF, status post CABG, left bundle branch block, diabetes mellitus type 2, hypertension presented with nausea.  She was diagnosed with UTI, dehydration, hyponatremia and [facility].  She was transferred to [facility] for further management for ongoing hyponatremia.  Nephrology was consulted.  GI was consulted for elevated LFTs.  Also palliative care was consulted for goals of care discussion.   Assessment & Plan:  Principal Problem:   Hyponatremia Active Problems:   Essential hypertension, benign   CAD, NATIVE VESSEL   Diabetes (HCC)   Depression   Hypokalemia   Prolonged QT interval   Combined systolic and diastolic congestive heart failure (HCC)   Protein-calorie malnutrition, severe   Cirrhosis of liver without ascites (HCC)   Elevated alanine aminotransferase (ALT) level    Hyponatremia -Initial sodium was 119; secondary to SSRI -Nephrology was consulted.  Tolvaptan was given, sodium initially improved.  This morning again 129, getting IV Lasix.  Starting today patient was placed on p.o. Lasix which is her home regimen. Repeat lab work this afternoon   Acute kidney injury -Improved.  Creatinine 1.07   Chronic systolic and diastolic CHF -Currently euvolemic -Continue Lasix 40 mg p.o. twice daily   Transaminitis -LFTs continue to improve.  Seen by GI, right upper quadrant ultrasound shows possible cirrhosis but appears to be compensated without any evidence of acute liver dysfunction.  Plans for outpatient follow-up per GI   Diabetes mellitus type 2 Blood sugars are well-controlled -Continue sliding scale insulin with NovoLog   Hypothyroidism -Continue Synthroid   Hypertension -Blood pressure is stable -Continue metoprolol    Agitation/insomnia -Patient's SSRI was stopped due to hyponatremia -She was started on Xanax; which was stopped due to agitation -Trazodone was increased 250 mg at bedtime    Seen by palliative care service to help establish goals of care, plans for their service to follow-up outpatient.  DVT prophylaxis: Lovenox Code Status: DNR Family Communication: Daughter at bedside  Status is: Inpatient Sodium level drifting down slowly.  Plans to recheck today and tomorrow morning.  If remains stable she may be able to go home  Subjective: Seen and examined at bedside.  Overall feels weak but does not have any other complaints   Examination:  General exam: frail Respiratory system: Clear to auscultation. Respiratory effort normal. Cardiovascular system: S1 & S2 heard, RRR. No JVD, murmurs, rubs, gallops or clicks. No pedal edema. Gastrointestinal system: Abdomen is nondistended, soft and nontender. No organomegaly or masses felt. Normal bowel sounds heard. Central nervous system: Alert and oriented. No focal neurological deficits. Extremities: Symmetric 4 x 5 power. Skin: No rashes, lesions or ulcers Psychiatry: Judgement and insight appear normal. Mood & affect appropriate.     Objective: Vitals:   03/28/22 1617 03/28/22 1926 03/29/22 0600 03/29/22 0751  BP: 100/61 (!) 100/56 91/62 96/62   Pulse: 69 71 70 96  Resp: 16 16 20    Temp: 98.4 F (36.9 C) 98 F (36.7 C) 97.7 F (36.5 C) (!) 97.5 F (36.4 C)  TempSrc: Oral Oral Axillary Oral  SpO2: 97% 97% 100% 99%  Weight:      Height:        Intake/Output Summary (Last 24 hours) at 03/29/2022 1406 Last data filed at 03/29/2022 1328 Gross per 24 hour  Intake 524 ml  Output  850 ml  Net -326 ml   Filed Weights   03/23/22 0504 03/24/22 0420 03/25/22 0447  Weight: 68.3 kg 68.2 kg 67.5 kg     Data Reviewed:   CBC: Recent Labs  Lab 03/24/22 0542 03/25/22 0228 03/26/22 0555 03/27/22 0326 03/28/22 0248  WBC 6.7 6.9 6.4  5.8 6.0  NEUTROABS 4.2 4.9 4.2 3.7 4.2  HGB 10.9* 10.9* 11.2* 11.0* 11.3*  HCT 31.3* 33.0* 34.3* 33.2* 33.1*  MCV 94.3 97.1 98.0 96.8 95.4  PLT 200 205 205 238 232   Basic Metabolic Panel: Recent Labs  Lab 03/24/22 0542 03/25/22 0228 03/26/22 0555 03/27/22 0326 03/28/22 0248 03/29/22 0522  NA 126* 129* 131* 133* 134* 129*  K 4.0 4.5 4.8 4.3 4.1 4.0  CL 91* 93* 92* 91* 92* 91*  CO2 26 26 30 31 31 28   GLUCOSE 107* 137* 151* 194* 155* 145*  BUN 47* 39* 33* 30* 30* 32*  CREATININE 1.20* 1.03* 1.00 0.97 1.07* 1.07*  CALCIUM 8.1* 8.5* 9.0 9.4 9.2 8.8*  MG 2.0 2.0 2.1 2.0 2.0  --    GFR: Estimated Creatinine Clearance: 31.7 mL/min (A) (by C-G formula based on SCr of 1.07 mg/dL (H)). Liver Function Tests: Recent Labs  Lab 03/25/22 0228 03/26/22 0555 03/27/22 0326 03/28/22 0248 03/29/22 0522  AST 331* 215* 146* 101* 74*  ALT 560* 420* 314* 248* 189*  ALKPHOS 188* 172* 163* 147* 140*  BILITOT 0.7 0.6 1.0 1.1 0.9  PROT 5.6* 6.1* 5.9* 6.0* 6.1*  ALBUMIN 3.0* 3.2* 3.2* 3.3* 3.2*   No results for input(s): "LIPASE", "AMYLASE" in the last 168 hours. Recent Labs  Lab 03/25/22 0228 03/27/22 0326  AMMONIA 35 26   Coagulation Profile: Recent Labs  Lab 03/25/22 0228 03/26/22 0555 03/27/22 0326 03/28/22 0248 03/29/22 0522  INR 1.2 1.1 1.1 1.1 1.2   Cardiac Enzymes: No results for input(s): "CKTOTAL", "CKMB", "CKMBINDEX", "TROPONINI" in the last 168 hours. BNP (last 3 results) No results for input(s): "PROBNP" in the last 8760 hours. HbA1C: No results for input(s): "HGBA1C" in the last 72 hours. CBG: Recent Labs  Lab 03/27/22 1641 03/28/22 0731 03/28/22 1213 03/28/22 1614 03/29/22 0743  GLUCAP 168* 146* 164* 123* 128*   Lipid Profile: No results for input(s): "CHOL", "HDL", "LDLCALC", "TRIG", "CHOLHDL", "LDLDIRECT" in the last 72 hours. Thyroid Function Tests: No results for input(s): "TSH", "T4TOTAL", "FREET4", "T3FREE", "THYROIDAB" in the last 72  hours. Anemia Panel: No results for input(s): "VITAMINB12", "FOLATE", "FERRITIN", "TIBC", "IRON", "RETICCTPCT" in the last 72 hours. Sepsis Labs: No results for input(s): "PROCALCITON", "LATICACIDVEN" in the last 168 hours.  No results found for this or any previous visit (from the past 240 hour(s)).       Radiology Studies: No results found.      Scheduled Meds:  aspirin EC  81 mg Oral Daily   busPIRone  7.5 mg Oral BID   enoxaparin (LOVENOX) injection  40 mg Subcutaneous Q24H   famotidine  20 mg Oral Daily   feeding supplement  237 mL Oral TID BM   furosemide  40 mg Oral BID   insulin aspart  0-9 Units Subcutaneous TID WC   levothyroxine  100 mcg Oral Q0600   metoprolol succinate  12.5 mg Oral Daily   multivitamin with minerals  1 tablet Oral Daily   polyethylene glycol  17 g Oral Daily   psyllium  1 packet Oral Daily   traZODone  150 mg Oral QHS   Continuous Infusions:   LOS:  19 days   Time spent= 35 mins    Cal Gindlesperger Joline Maxcy, MD [facility]  If 7PM-7AM, please contact night-coverage  03/29/2022, 2:06 PM

## 2022-03-29 NOTE — TOC Initial Note (Signed)
Transition of Care [facility]) - Initial/Assessment Note    Patient Details  Name: Erin Herman MRN: [id number] Date of Birth: [birth year]  Transition of Care [facility]) CM/SW Contact:    Loreta Ave, [place] Phone Number: 03/29/2022, 12:57 PM  Clinical Narrative:                  CSW spoke with pt's daughter [name], provided her with the contact information for [facility]).   Expected Discharge Plan: Skilled Nursing Facility Barriers to Discharge: Continued Medical Work up   Patient Goals and CMS Choice Patient states their goals for this hospitalization and ongoing recovery are:: rehab CMS Medicare.gov Compare Post Acute Care list provided to:: Patient Represenative (must comment) Choice offered to / list presented to : Adult Children  Expected Discharge Plan and Services Expected Discharge Plan: [place] In-house Referral: Clinical Social Work   Post Acute Care Choice: [place] Living arrangements for the past 2 months: [place] Arranged: RN, PT [place] Agency: [place] Date [place]: 03/12/22   Representative spoke with at [place]: Tommi Rumps  Prior Living Arrangements/Services Living arrangements for the past 2 months: [place] Lives with:: Pets Patient language and need for interpreter reviewed:: Yes Do you feel safe going back to the place where you live?: Yes      Need for Family Participation in Patient Care: No (Comment) Care giver support system in place?: Yes (comment) Current home services: DME Criminal Activity/Legal Involvement Pertinent to Current Situation/Hospitalization: No - Comment as needed  Activities of Daily Living Home Assistive Devices/Equipment: None, Eyeglasses, Hearing aid, Environmental consultant (specify type), Bedside commode/3-in-1, Cane (specify quad or straight), Wheelchair, CBG Meter ADL Screening (condition at time of  admission) Patient's cognitive ability adequate to safely complete daily activities?: Yes Is the patient deaf or have difficulty hearing?: Yes Does the patient have difficulty seeing, even when wearing glasses/contacts?: Yes Does the patient have difficulty concentrating, remembering, or making decisions?: Yes Patient able to express need for assistance with ADLs?: No Does the patient have difficulty dressing or bathing?: Yes Independently performs ADLs?: No Communication: Independent Dressing (OT): Needs assistance Is this a change from baseline?: Pre-admission baseline Grooming: Independent Feeding: Needs assistance Is this a change from baseline?: Change from baseline, expected to last >3 days Bathing: Needs assistance Is this a change from baseline?: Change from baseline, expected to last >3 days Toileting: Needs assistance Is this a change from baseline?: Pre-admission baseline In/Out Bed: Needs assistance Is this a change from baseline?: Pre-admission baseline Walks in Home: Needs assistance Is this a change from baseline?: Pre-admission baseline Does the patient have difficulty walking or climbing stairs?: Yes Weakness of Legs: None Weakness of Arms/Hands: None  Permission Sought/Granted Permission sought to share information with : Chartered certified accountant granted to share information with : Yes, Verbal Permission Granted     Permission granted to share info w AGENCY: SNF        Emotional Assessment Appearance:: Appears younger than stated age Attitude/Demeanor/Rapport: Engaged Affect (typically observed): Pleasant Orientation: : Oriented to Self, Oriented to Place, Oriented to  Time, Oriented to Situation Alcohol / Substance Use: Not Applicable Psych Involvement: No (comment)  Admission diagnosis:  Hypokalemia [E87.6] Hypomagnesemia [E83.42] Hyponatremia [E87.1] Nausea [R11.0] Patient Active Problem List   Diagnosis Date  Noted    Protein-calorie malnutrition, severe 03/24/2022   Cirrhosis of liver without ascites (HCC) 03/24/2022   Elevated alanine aminotransferase (ALT) level 03/24/2022   Hyponatremia 03/10/2022   Prolonged QT interval 03/10/2022   Combined systolic and diastolic congestive heart failure (HCC) 03/10/2022   Hypothyroidism 11/23/2021   Leukocytosis 11/23/2021   Depression 07/29/2021   Hypokalemia 07/29/2021   GERD (gastroesophageal reflux disease) 12/29/2020   Chest pain 11/06/2020   Chronic systolic (congestive) heart failure [facility])    Status post left hip replacement 04/29/2020   NSTEMI (non-ST elevated myocardial infarction) (HCC) 11/04/2018   Dysphagia 05/10/2018   Diabetes (HCC) 02/27/2018   S/P right THA, AA 03/01/2017   Postoperative anemia due to acute blood loss 05/22/2014   DJD (degenerative joint disease) of knee 05/20/2014   Primary localized osteoarthritis of left knee 04/30/2014   Preoperative cardiovascular examination 04/04/2014   Chronic diastolic heart failure (HCC) 07/03/2012   CAROTID ARTERY DISEASE 12/09/2009   Essential hypertension, benign 08/05/2008   CAD, NATIVE VESSEL 08/05/2008   Secondary cardiomyopathy (HCC) 08/05/2008   Aortic valve disorder 08/05/2008   Hyperlipidemia 08/03/2008   PCP:  Kirstie Peri, MD Pharmacy:   [facility] ORDER) ELECTRONIC - [place], [place] - [address] [place] [address] Phone: [phone] Fax: [phone]  [facility] PRIME [phone] Madie Reno, [place] - [address] [facility] [address] AT [facility] [address] [place] [address] [place] [address] Phone: [phone] Fax: [phone]  [facility] SERVICE) [facility] - [place], [place] - [address] [place] [address] Phone: [phone] Fax: [phone]  [facility] [address] [place] - [address] [place] [address] Phone: [phone] Fax: [phone]     Social  Determinants of Health (SDOH) Interventions    Readmission Risk Interventions     No data to display

## 2022-03-29 NOTE — Progress Notes (Signed)
Physical Therapy Treatment Patient Details Name: Erin Herman MRN: [id number] DOB: 1928/04/20 Today's Date: 03/29/2022   History of Present Illness Erin Herman is a 88 y.o. female with medical history significant for systolic and diastolic CHF, CABG, left bundle branch block, diabetes mellitus, hypertension. Patient presented to the ED with complaints of nausea ongoing for about 5 days now. 1 episode of vomiting at onset of nausea. Over the past 5 days, reports mild confusion over the past 5 days, that patient is taking longer to respond to questions and has been having some gait abnormality.    PT Comments    Pt lethargic and difficult to keep engaged during PT session, pt's son-in-law present and states she has been sleeping all afternoon. Pt with confusion this session, not oriented to location or son-in-law in room until given options. Pt overall requiring mod physical assist for transfer-level mobility at this time, which is a marked decrease in functional ability since last week. Per chart review, pt's family want pt to d/c home. Pt will need 24/7 assist at d/c for safety.    Recommendations for follow up therapy are one component of a multi-disciplinary discharge planning process, led by the attending physician.  Recommendations may be updated based on patient status, additional functional criteria and insurance authorization.  Follow Up Recommendations  Home health PT (maximize home services) Can patient physically be transported by private vehicle: Yes   Assistance Recommended at Discharge Set up Supervision/Assistance  Patient can return home with the following A little help with walking and/or transfers;A little help with bathing/dressing/bathroom;Assistance with cooking/housework;Help with stairs or ramp for entrance   Equipment Recommendations  None recommended by PT    Recommendations for Other Services       Precautions / Restrictions  Precautions Precautions: Fall Restrictions Weight Bearing Restrictions: No     Mobility  Bed Mobility Overal bed mobility: Needs Assistance Bed Mobility: Supine to Sit     Supine to sit: Mod assist     General bed mobility comments: assist for LE progression, trunk elevation, and scooting to EOB with assist of bed pad. Increased time    Transfers Overall transfer level: Needs assistance Equipment used: Rolling walker (2 wheels) Transfers: Sit to/from Stand, Bed to chair/wheelchair/BSC Sit to Stand: Mod assist Stand pivot transfers: Mod assist         General transfer comment: assist for power up, rise, steadying, min stepping to get to recliner    Ambulation/Gait               General Gait Details: nt - pt reports being too fatigued   Stairs             Wheelchair Mobility    Modified Rankin (Stroke Patients Only)       Balance Overall balance assessment: Needs assistance Sitting-balance support: Bilateral upper extremity supported, Feet supported Sitting balance-Leahy Scale: Fair     Standing balance support: During functional activity, Reliant on assistive device for balance, Bilateral upper extremity supported Standing balance-Leahy Scale: Poor Standing balance comment: significant posterior lean                            Cognition Arousal/Alertness: Lethargic Behavior During Therapy: Flat affect Overall Cognitive Status: Impaired/Different from baseline Area of Impairment: Orientation, Attention, Memory, Following commands, Safety/judgement                 Orientation Level: Disoriented to, Situation, Place Current Attention Level:  Focused Memory: Decreased short-term memory Following Commands: Follows one step commands with increased time Safety/Judgement: Decreased awareness of safety, Decreased awareness of deficits     General Comments: pt lethargic throughout session, wakes briefly to answer PT questions or  follow commands. Pt states she is in a rose garden, cannot state what her son-in-law's name is unless given options to choose from.        Exercises      General Comments        Pertinent Vitals/Pain Pain Assessment Pain Assessment: Faces Faces Pain Scale: Hurts a little bit Pain Location: buttocks Pain Descriptors / Indicators: Sore, Grimacing Pain Intervention(s): Monitored during session    Home Living                          Prior Function            PT Goals (current goals can now be found in the care plan section) Acute Rehab PT Goals Patient Stated Goal: home PT Goal Formulation: With patient/family Time For Goal Achievement: 04/02/22 Potential to Achieve Goals: Fair Progress towards PT goals: Progressing toward goals    Frequency    Min 2X/week      PT Plan Current plan remains appropriate    Co-evaluation              AM-PAC PT "6 Clicks" Mobility   Outcome Measure  Help needed turning from your back to your side while in a flat bed without using bedrails?: A Little Help needed moving from lying on your back to sitting on the side of a flat bed without using bedrails?: A Little Help needed moving to and from a bed to a chair (including a wheelchair)?: A Little Help needed standing up from a chair using your arms (e.g., wheelchair or bedside chair)?: A Little Help needed to walk in hospital room?: A Little Help needed climbing 3-5 steps with a railing? : A Lot 6 Click Score: 17    End of Session   Activity Tolerance: Patient tolerated treatment well;Patient limited by fatigue Patient left: with call bell/phone within reach;in chair;with family/visitor present (pt's son-in-law in the room, states they will call for assist from staff prior to mobilizing out of chair to bed) Nurse Communication: Mobility status PT Visit Diagnosis: Unsteadiness on feet (R26.81);Other abnormalities of gait and mobility (R26.89);Muscle weakness  (generalized) (M62.81)     Time: 1647-1700 PT Time Calculation (min) (ACUTE ONLY): 13 min  Charges:  $Therapeutic Activity: 8-22 mins                     Stacie Glaze, PT DPT Acute Rehabilitation Services Pager [phone]  Office [phone]    [place] 03/29/2022, 6:12 PM

## 2022-03-29 NOTE — Progress Notes (Signed)
Daily Progress Note   Patient Name: Erin Herman       Date: 03/29/2022 DOB: Jan 30, 1928  Age: 88 y.o. MRN#: [id number] Attending Physician: Dimple Nanas, MD Primary Care Physician: Kirstie Peri, MD Admit Date: 03/10/2022  Reason for Consultation/Follow-up: Non pain symptom management  Subjective: Chart review performed including progress notes, labs, imaging.  Patient assessed at the bedside.  She is comfortable, pleasantly confused.  Her daughter [name] is present visiting.  We discussed patient's anxiety and rest last night - patient's daughter shares it was somewhat better than the night before but still with continued agitation.  We discussed the possibility of hospital delirium and recommendations for another day at current dosage before further titration.  [name] is agreeable.  Addressed her questions regarding follow-up prescriptions in the outpatient setting.  Confirmed that patient will receive outpatient palliative care services to continue ongoing management of anxiety and provided with update that her program of choice, [facility], is now referred to as [place].  Offered to assist with providing their contact information for additional questions, including frequency of visits and what pharmacy the program typically uses.   [name] also has questions regarding how long patient will receive home health services and whether an outpatient primary care visit on 12/18 will need to be scheduled sooner.  Offered to advocate for patient's attending to address these questions.  Goals remain clear for return home with home health and palliative care.  Questions and concerns addressed.  PMT will continue to follow and support holistically.  Length of Stay: 19  Physical  Exam Vitals and nursing note reviewed.  Constitutional:      General: She is not in acute distress.    Interventions: Nasal cannula in place.     Comments: Somnolent  Cardiovascular:     Rate and Rhythm: Normal rate.  Pulmonary:     Effort: No respiratory distress.  Skin:    General: Skin is warm and dry.  Neurological:     Mental Status: She is alert. She is confused.     Motor: Weakness present.  Psychiatric:        Attention and Perception: Attention normal.        Mood and Affect: Mood normal.        Behavior: Behavior is cooperative.  Vital Signs: BP 96/62   Pulse 96   Temp (!) 97.5 F (36.4 C) (Oral)   Resp 20   Ht 5\' 4"  (1.626 m)   Wt 67.5 kg   LMP  (LMP Unknown)   SpO2 99%   BMI 25.54 kg/m  SpO2: SpO2: 99 % O2 Device: O2 Device: Room Air O2 Flow Rate: O2 Flow Rate (L/min): 2 L/min  Palliative Assessment/Data: PPS 20-30%   Palliative Care Assessment & Plan   Patient Profile: 88 y.o. female  with past medical history of systolic and diastolic CHF, CABG, left bundle branch block, diabetes mellitus, hypertension  admitted on 03/10/2022 with hyponatremia, AKI, dehydration and UTI.    Patient was transferred to [facility] on 03/23/2022 for further management of ongoing hyponatremia.  Nephrology is following.  GI also following for elevated LFTs and new imaging is concerning for cirrhosis. PMT has been consulted to assist with goals of care conversation.  Assessment: Principal Problem:   Hyponatremia Active Problems:   Essential hypertension, benign   CAD, NATIVE VESSEL   Diabetes (HCC)   Depression   Hypokalemia   Prolonged QT interval   Combined systolic and diastolic congestive heart failure (HCC)   Protein-calorie malnutrition, severe   Cirrhosis of liver without ascites (HCC)   Elevated alanine aminotransferase (ALT) level   Concern about end of life  Recommendations/Plan: Continue DNR/DNI Continue current care Goal remains  return home with [facility] and outpatient Palliative Care to follow, anticipate discharge later today Reached out to [facility] for assistance with providing contact information for outpatient palliative care program through [facility]), as patient's daughter has questions regarding their services Continue Buspar 7.5mg  BID for anxiety Restarted patient's home Metamucil once daily at daughter's request PMT will continue to follow and support holistically  Prognosis:  Unable to determine  Discharge Planning: Home with Home Health and outpatient Palliative Care  Care plan was discussed with patient, patient's daughter, [facility], [facility]   MDM: high   [facility], [facility] Palliative Medicine Team Team phone # [phone]  Thank you for allowing the Palliative Medicine Team to assist in the care of this patient. Please utilize secure chat with additional questions, if there is no response within 30 minutes please call the above phone number.  Palliative Medicine Team providers are available by phone from 7am to 7pm daily and can be reached through the team cell phone.  Should this patient require assistance outside of these hours, please call the patient's attending physician.

## 2022-03-30 ENCOUNTER — Other Ambulatory Visit (HOSPITAL_COMMUNITY): Payer: Self-pay

## 2022-03-30 DIAGNOSIS — E871 Hypo-osmolality and hyponatremia: Secondary | ICD-10-CM | POA: Diagnosis not present

## 2022-03-30 LAB — BASIC METABOLIC PANEL
Anion gap: 10 (ref 5–15)
BUN: 38 mg/dL — ABNORMAL HIGH (ref 8–23)
CO2: 30 mmol/L (ref 22–32)
Calcium: 8.9 mg/dL (ref 8.9–10.3)
Chloride: 92 mmol/L — ABNORMAL LOW (ref 98–111)
Creatinine, Ser: 1.14 mg/dL — ABNORMAL HIGH (ref 0.44–1.00)
GFR, Estimated: 45 mL/min — ABNORMAL LOW (ref >60)
Glucose, Bld: 142 mg/dL — ABNORMAL HIGH (ref 70–99)
Potassium: 4 mmol/L (ref 3.5–5.1)
Sodium: 132 mmol/L — ABNORMAL LOW (ref 135–145)

## 2022-03-30 LAB — CBC
HCT: 30.8 % — ABNORMAL LOW (ref 36.0–46.0)
Hemoglobin: 10.6 g/dL — ABNORMAL LOW (ref 12.0–15.0)
MCH: 32.8 pg (ref 26.0–34.0)
MCHC: 34.4 g/dL (ref 30.0–36.0)
MCV: 95.4 fL (ref 80.0–100.0)
Platelets: 224 10*3/uL (ref 150–400)
RBC: 3.23 MIL/uL — ABNORMAL LOW (ref 3.87–5.11)
RDW: 14.3 % (ref 11.5–15.5)
WBC: 5.8 10*3/uL (ref 4.0–10.5)
nRBC: 0 % (ref 0.0–0.2)

## 2022-03-30 LAB — PROTIME-INR
INR: 1.2 (ref 0.8–1.2)
Prothrombin Time: 14.8 seconds (ref 11.4–15.2)

## 2022-03-30 LAB — GLUCOSE, CAPILLARY: Glucose-Capillary: 138 mg/dL — ABNORMAL HIGH (ref 70–99)

## 2022-03-30 LAB — MAGNESIUM: Magnesium: 2 mg/dL (ref 1.7–2.4)

## 2022-03-30 MED ORDER — TRAZODONE HCL 150 MG PO TABS
150.0000 mg | ORAL_TABLET | Freq: Every day | ORAL | 0 refills | Status: AC
  Filled 2022-03-30: qty 30, 30d supply, fill #0

## 2022-03-30 MED ORDER — BUSPIRONE HCL 7.5 MG PO TABS
7.5000 mg | ORAL_TABLET | Freq: Two times a day (BID) | ORAL | 0 refills | Status: AC
  Filled 2022-03-30: qty 30, 15d supply, fill #0

## 2022-03-30 MED ORDER — LEVOTHYROXINE SODIUM 100 MCG PO TABS
100.0000 ug | ORAL_TABLET | Freq: Every day | ORAL | 0 refills | Status: AC
  Filled 2022-03-30: qty 30, 30d supply, fill #0

## 2022-03-30 NOTE — Progress Notes (Signed)
Occupational Therapy Treatment Patient Details Name: Erin Herman MRN: [id number] DOB: Mar 19, 1928 Today's Date: 03/30/2022   History of present illness Erin Herman is a 88 y.o. female with medical history significant for systolic and diastolic CHF, CABG, left bundle branch block, diabetes mellitus, hypertension. Patient presented to the ED with complaints of nausea ongoing for about 5 days now. 1 episode of vomiting at onset of nausea. Over the past 5 days, reports mild confusion over the past 5 days, that patient is taking longer to respond to questions and has been having some gait abnormality.   OT comments  Patient standing at [facility] upon entry with daughter assisting with clothing management. Patient was min assist to transfer to recliner and performed oral care seated. Patient able to stand at sink for combing hair with min guard assist for balance. LB dressing performed with patient able to doff socks and requiring assistance to donn left sock. Performed demonstrated good progress this treatment session. Acute OT to continue to follow.    Recommendations for follow up therapy are one component of a multi-disciplinary discharge planning process, led by the attending physician.  Recommendations may be updated based on patient status, additional functional criteria and insurance authorization.    Follow Up Recommendations  Skilled nursing-short term rehab (<3 hours/day)     Assistance Recommended at Discharge Frequent or constant Supervision/Assistance  Patient can return home with the following  A lot of help with walking and/or transfers;A lot of help with bathing/dressing/bathroom;Assistance with cooking/housework;Direct supervision/assist for medications management;Direct supervision/assist for financial management;Assist for transportation;Help with stairs or ramp for entrance   Equipment Recommendations  None recommended by OT    Recommendations for Other Services       Precautions / Restrictions Precautions Precautions: Fall Restrictions Weight Bearing Restrictions: No       Mobility Bed Mobility Overal bed mobility: Needs Assistance             General bed mobility comments: OOB upon entry    Transfers Overall transfer level: Needs assistance Equipment used: Rolling walker (2 wheels) Transfers: Sit to/from Stand, Bed to chair/wheelchair/BSC Sit to Stand: Min assist     Step pivot transfers: Min assist     General transfer comment: cues for safety and hand placemetn     Balance Overall balance assessment: Needs assistance Sitting-balance support: Bilateral upper extremity supported, Feet supported Sitting balance-Leahy Scale: Fair     Standing balance support: Single extremity supported, Bilateral upper extremity supported, During functional activity Standing balance-Leahy Scale: Poor Standing balance comment: able to stand at sink for grooming with min guard                           ADL either performed or assessed with clinical judgement   ADL Overall ADL's : Needs assistance/impaired     Grooming: Wash/dry hands;Wash/dry face;Oral care;Brushing hair;Set up;Min guard;Sitting;Standing Grooming Details (indicate cue type and reason): performed oral care and face washing seated with setup and combed hair while standing at sink with min guard assist                 Toilet Transfer: Minimal assistance;Rolling walker (2 wheels) Toilet Transfer Details (indicate cue type and reason): standing at [facility] with daughter upon entry and was min assist to transfer to recliner           General ADL Comments: daughter assisted patient with toilet hygiene    Extremity/Trunk Assessment  Vision       Perception     Praxis      Cognition Arousal/Alertness: Awake/alert Behavior During Therapy: Flat affect Overall Cognitive Status: Impaired/Different from baseline                      Current Attention Level: Focused Memory: Decreased short-term memory Following Commands: Follows one step commands inconsistently, Follows one step commands with increased time Safety/Judgement: Decreased awareness of safety, Decreased awareness of deficits     General Comments: more alert with increased time to follow commands        Exercises      Shoulder Instructions       General Comments      Pertinent Vitals/ Pain       Pain Assessment Pain Assessment: Faces Faces Pain Scale: Hurts a little bit Pain Location: buttocks Pain Descriptors / Indicators: Sore, Grimacing Pain Intervention(s): Monitored during session, Repositioned  Home Living                                          Prior Functioning/Environment              Frequency  Min 2X/week        Progress Toward Goals  OT Goals(current goals can now be found in the care plan section)  Progress towards OT goals: Progressing toward goals  Acute Rehab OT Goals Patient Stated Goal: go home OT Goal Formulation: With patient/family Time For Goal Achievement: 04/07/22 Potential to Achieve Goals: Fair ADL Goals Pt Will Perform Lower Body Bathing: with min guard assist;sit to/from stand;sitting/lateral leans Pt Will Perform Lower Body Dressing: with min guard assist;sit to/from stand;sitting/lateral leans Pt Will Transfer to Toilet: with min guard assist;ambulating;grab bars Pt Will Perform Toileting - Clothing Manipulation and hygiene: with min guard assist;sitting/lateral leans;sit to/from stand Additional ADL Goal #1: Patient will be independent in bed mobility as a precursor to ADLs and OOB activity. Additional ADL Goal #2: Patient will be able to complete functional task in standing for 2 minutes prior to needing seated rest break to improve activity tolerance.  Plan Discharge plan remains appropriate    Co-evaluation                 AM-PAC OT "6 Clicks" Daily Activity      Outcome Measure   Help from another person eating meals?: A Little Help from another person taking care of personal grooming?: A Little Help from another person toileting, which includes using toliet, bedpan, or urinal?: A Lot Help from another person bathing (including washing, rinsing, drying)?: A Lot Help from another person to put on and taking off regular upper body clothing?: A Little Help from another person to put on and taking off regular lower body clothing?: A Lot 6 Click Score: 15    End of Session Equipment Utilized During Treatment: Gait belt;Rolling walker (2 wheels)  OT Visit Diagnosis: Unsteadiness on feet (R26.81);Muscle weakness (generalized) (M62.81);Other abnormalities of gait and mobility (R26.89)   Activity Tolerance Patient tolerated treatment well   Patient Left in chair;with call bell/phone within reach;with family/visitor present   Nurse Communication Mobility status        Time: [id number] OT Time Calculation (min): 26 min  Charges: OT General Charges $OT Visit: 1 Visit OT Treatments $Self Care/Home Management : 23-37 mins  Lodema Hong, [place]  Office [phone]   Dewain Penning 03/30/2022, 12:04 PM

## 2022-03-30 NOTE — TOC Transition Note (Signed)
Transition of Care [facility]) - CM/SW Discharge Note   Patient Details  Name: Erin Herman MRN: [id number] Date of Birth: [birth year]  [facility] CM/SW Contact:  Curlene Labrum, RN Phone Number: 03/30/2022, 10:30 AM   Clinical Narrative:    Cm met with the patient and daughter at the bedside today to discuss discharge to home today.  The patient is set up with home health with [facility] and orders are placed from last week.  The patient will have follow up with Palliative care medicine through [facility] care.  The patient's daughter plans to transport the patient home by car.  Patient will be discharged home by bedside nursing.     Barriers to Discharge: Continued Medical Work up   Patient Goals and CMS Choice Patient states their goals for this hospitalization and ongoing recovery are:: rehab CMS Medicare.gov Compare Post Acute Care list provided to:: Patient Represenative (must comment) Choice offered to / list presented to : Adult Children  Discharge Placement                       Discharge Plan and Services In-house Referral: Clinical Social Work   Post Acute Care Choice: [place]                    HH Arranged: RN, PT [facility] Agency: [place] Date [facility] Agency Contacted: 03/12/22   Representative spoke with at [place]: [place] Interventions     Readmission Risk Interventions     No data to display

## 2022-03-30 NOTE — Discharge Summary (Signed)
Physician Discharge Summary  James Brockwell RUE:[id number] DOB: 20-May-1927 DOA: 03/10/2022  PCP: Kirstie PeriShah, Ashish, MD  Admit date: 03/10/2022 Discharge date: 03/30/2022  Admitted From: Home Disposition:  Home  Recommendations for Outpatient Follow-up:  Follow up with PCP in 1-2 weeks Please obtain BMP/CBC in one week your next doctors visit.  Zoloft discontinued.  Started on BuSpar Patient prefers to go home with home health instead of SNF Synthroid dose increased to 100 mcg Discharge Condition: Stable CODE STATUS: DNR Diet recommendation: Heart healthy  Brief/Interim Summary:  88 year old female with medical history of chronic systolic and diastolic CHF, status post CABG, left bundle branch block, diabetes mellitus type 2, hypertension presented with nausea.  She was diagnosed with UTI, dehydration, hyponatremia and admitted to [facility].  [facility] for ongoing hyponatremia.  Nephrology was consulted.  GI was consulted for elevated LFTs.  Also palliative care was consulted for goals of care discussion.  Today patient was sodium remained stable at 132.  Medication adjustment as mentioned above.  Will discharge patient home in stable condition.  Discussed her care extensively with patient's daughter at bedside as well.    Assessment and plan:  Hyponatremia -Initial sodium was 119; secondary to SSRI -Nephrology was consulted.  Tolvaptan was given, sodium initially improved.  This morning again 129, getting IV Lasix.  Patient has been switched to home Lasix.  Sodium on discharge was 132 and remained stable.   Acute kidney injury -Improved.  Creatinine 1.14   Chronic systolic and diastolic CHF -Currently euvolemic -Continue Lasix 40 mg p.o. twice daily   Transaminitis -LFTs continue to improve.  Seen by GI, right upper quadrant ultrasound shows possible cirrhosis but appears to be compensated without any evidence of acute  liver dysfunction.  Plans for outpatient follow-up per GI   Diabetes mellitus type 2 Blood sugars are well-controlled -Continue sliding scale insulin with NovoLog   Hypothyroidism -Continue Synthroid, Synthroid dose increased to 100 mcg   Hypertension -Blood pressure is stable -Continue metoprolol   Agitation/insomnia Home regimen of Zoloft has been changed to BuSpar.  Discharge Diagnoses:  Principal Problem:   Hyponatremia Active Problems:   Essential hypertension, benign   CAD, NATIVE VESSEL   Diabetes (HCC)   Depression   Hypokalemia   Prolonged QT interval   Combined systolic and diastolic congestive heart failure (HCC)   Protein-calorie malnutrition, severe   Cirrhosis of liver without ascites (HCC)   Elevated alanine aminotransferase (ALT) level      Consultations: Palliative care Nephrology  Subjective: Feeling well no complaints.  Daughter present at bedside  Discharge Exam: Vitals:   03/30/22 0351 03/30/22 0850  BP: 107/67 120/76  Pulse: 91 (!) 123  Resp: 15 18  Temp: 98.2 F (36.8 C) 97.7 F (36.5 C)  SpO2: 94% 98%   Vitals:   03/29/22 2103 03/30/22 0351 03/30/22 0500 03/30/22 0850  BP: 101/75 107/67  120/76  Pulse:  91  (!) 123  Resp:  15  18  Temp:  98.2 F (36.8 C)  97.7 F (36.5 C)  TempSrc:  Oral  Oral  SpO2:  94%  98%  Weight:   69.6 kg   Height:        General: Pt is alert, awake, not in acute distress Cardiovascular: RRR, S1/S2 +, no rubs, no gallops Respiratory: CTA bilaterally, no wheezing, no rhonchi Abdominal: Soft, NT, ND, bowel sounds + Extremities: no edema, no cyanosis  Discharge Instructions   Allergies as  of 03/30/2022       Reactions   Lipitor [atorvastatin]    MUSCLE ACHES/FATIGUE   Morphine Nausea And Vomiting   Nitroglycerin Other (See Comments)   Very sensitive, drops BP significantly per pt   Penicillins Swelling, Other (See Comments)   Tolerated Cephalosporin Date: 04/29/20. Severe arm swelling and  redness Did it involve swelling of the face/tongue/throat, SOB, or low BP? No Did it involve sudden or severe rash/hives, skin peeling, or any reaction on the inside of your mouth or nose? No Did you need to seek medical attention at a hospital or doctor's office? No When did it last happen?      30 years If all above answers are "NO", may proceed with cephalosporin use.   Reclast [zoledronic Acid]    "nausea and vomiting. Could not get out of bed.        Medication List     STOP taking these medications    ciprofloxacin 500 MG tablet Commonly known as: CIPRO   sertraline 50 MG tablet Commonly known as: ZOLOFT       TAKE these medications    acetaminophen 325 MG tablet Commonly known as: Tylenol Take 2 tablets (650 mg total) by mouth every 6 (six) hours as needed. What changed: reasons to take this   ascorbic acid 500 MG tablet Commonly known as: VITAMIN C Take 500 mg by mouth daily.   aspirin EC 81 MG tablet Take 1 tablet (81 mg total) by mouth daily. Swallow whole.   busPIRone 7.5 MG tablet Commonly known as: BUSPAR Take 1 tablet (7.5 mg total) by mouth 2 (two) times daily.   CINNAMON PO Take 1 tablet by mouth 2 (two) times daily.   COQ10 PO Take 1 tablet by mouth daily.   famotidine 20 MG tablet Commonly known as: PEPCID Take 10 mg by mouth 2 (two) times daily.   furosemide 40 MG tablet Commonly known as: LASIX Take 40 mg by mouth 2 (two) times daily.   isosorbide dinitrate 20 MG tablet Commonly known as: ISORDIL Take 1 tablet (20 mg total) by mouth 2 (two) times daily.   levothyroxine 100 MCG tablet Commonly known as: SYNTHROID Take 1 tablet (100 mcg total) by mouth daily at 6 (six) AM. Start taking on: March 31, 2022 What changed:  medication strength how much to take when to take this   metFORMIN 500 MG tablet Commonly known as: GLUCOPHAGE Take 1 tablet (500 mg total) by mouth 2 (two) times daily with a meal.   metoprolol succinate 25  MG 24 hr tablet Commonly known as: Toprol XL Take 1 tablet (25 mg total) by mouth daily.   OneTouch Ultra test strip Generic drug: glucose blood USE 1 STRIP TO CHECK GLUCOSE ONCE DAILY   potassium chloride SA 20 MEQ tablet Commonly known as: KLOR-CON M Take 1 tablet (20 mEq total) by mouth daily after supper.   psyllium 58.6 % packet Commonly known as: METAMUCIL Take 1 packet by mouth daily.   rosuvastatin 5 MG tablet Commonly known as: CRESTOR Take 1 tablet (5 mg total) by mouth daily.   spironolactone 25 MG tablet Commonly known as: ALDACTONE Take 0.5 tablets (12.5 mg total) by mouth daily.   SYSTANE OP Place 1 drop into both eyes as needed (dry eye).   traZODone 150 MG tablet Commonly known as: DESYREL Take 1 tablet (150 mg total) by mouth at bedtime.   Vitamin D3 125 MCG (5000 UT) Caps Take 5,000 Units by mouth  daily.   Vitamin E 180 MG (400 UNIT) Caps Take 400 Units by mouth daily.        Follow-up Information     [facility] Follow up.   Specialty: Home Health Services Why: [facility] staff will call you to schedule in home visits Contact information: [address] [place] [address] [phone]         [place], [facility] Follow up.   Why: [facility]) will be providing Outpatient Palliative care services in the home. Contact information: [address] [place] [address] [phone]         Kirstie Peri, MD Follow up in 1 week(s).   Specialty: Internal Medicine Contact information: [address]  [place] [address] [phone]         Kirstie Peri, MD .   Specialty: Internal Medicine Contact information: [address]  [place] [address] [phone]                Allergies  Allergen Reactions   Lipitor [Atorvastatin]     MUSCLE ACHES/FATIGUE   Morphine Nausea And Vomiting   Nitroglycerin Other (See Comments)    Very sensitive, drops BP significantly per  pt   Penicillins Swelling and Other (See Comments)    Tolerated Cephalosporin Date: 04/29/20.  Severe arm swelling and redness Did it involve swelling of the face/tongue/throat, SOB, or low BP? No Did it involve sudden or severe rash/hives, skin peeling, or any reaction on the inside of your mouth or nose? No Did you need to seek medical attention at a hospital or doctor's office? No When did it last happen?      30 years If all above answers are "NO", may proceed with cephalosporin use.   Reclast [Zoledronic Acid]     "nausea and vomiting. Could not get out of bed.    You were cared for by a hospitalist during your hospital stay. If you have any questions about your discharge medications or the care you received while you were in the hospital after you are discharged, you can call the unit and asked to speak with the hospitalist on call if the hospitalist that took care of you is not available. Once you are discharged, your primary care physician will handle any further medical issues. Please note that no refills for any discharge medications will be authorized once you are discharged, as it is imperative that you return to your primary care physician (or establish a relationship with a primary care physician if you do not have one) for your aftercare needs so that they can reassess your need for medications and monitor your lab values.   Procedures/Studies: US Abdomen Limited RUQ (LIVER/GB)  Result Date: 03/23/2022 CLINICAL DATA:  [id number] Transaminitis [id number] EXAM: ULTRASOUND ABDOMEN LIMITED RIGHT UPPER QUADRANT COMPARISON:  Chest x-ray 03/13/2022 FINDINGS: Gallbladder: Status post cholecystectomy. No sonographic Murphy sign noted by sonographer. Common bile duct: Diameter: 3 mm. Liver: Nodular hepatic contour. No focal lesion identified. Within normal limits in parenchymal echogenicity. Portal vein is patent on color Doppler imaging with normal direction of blood flow towards the liver. Other:  Trace simple free fluid, at least trace to small volume right pleural effusion. IMPRESSION: 1. Cirrhotic morphology of the liver. No focal liver lesions identified by ultrasound. Please note that liver protocol enhanced MR and CT are the most sensitive tests for the screening detection of hepatocellular carcinoma in the high risk setting of cirrhosis. 2. Trace  simple free fluid ascites. 3. At least trace to small volume right pleural effusion. 4. Status post cholecystectomy. Electronically Signed   By: Tish Frederickson M.D.   On: 03/23/2022 15:24   DG CHEST PORT 1 VIEW  Result Date: 03/13/2022 CLINICAL DATA:  Dyspnea. EXAM: PORTABLE CHEST 1 VIEW COMPARISON:  Chest x-ray 11/22/2021 FINDINGS: Heart is enlarged. Patient is status post cardiac surgery. There are new small bilateral pleural effusions. There central pulmonary vascular congestion. Patchy bibasilar opacities are present. There is no pneumothorax or acute fracture. Surgical clips overlie the left neck, unchanged. IMPRESSION: 1. Cardiomegaly with central pulmonary vascular congestion and new small bilateral pleural effusions. 2. Patchy bibasilar opacities may reflect atelectasis or infection. Electronically Signed   By: Darliss Cheney M.D.   On: 03/13/2022 23:19   CT ABDOMEN PELVIS W CONTRAST  Result Date: 03/10/2022 CLINICAL DATA:  Nausea for 5 days EXAM: CT ABDOMEN AND PELVIS WITH CONTRAST TECHNIQUE: Multidetector CT imaging of the abdomen and pelvis was performed using the standard protocol following bolus administration of intravenous contrast. RADIATION DOSE REDUCTION: This exam was performed according to the departmental dose-optimization program which includes automated exposure control, adjustment of the mA and/or kV according to patient size and/or use of iterative reconstruction technique. CONTRAST:  2mL OMNIPAQUE IOHEXOL 300 MG/ML  SOLN COMPARISON:  None Available. FINDINGS: Lower chest: The heart is enlarged without pericardial effusion.  Extensive atherosclerosis of the aorta and native coronary vessels. There are small bilateral pleural effusions, right greater than left, with minimal dependent lower lobe atelectasis. Hepatobiliary: Cholecystectomy. Liver is grossly unremarkable with no biliary duct dilation. Pancreas: Diffuse pancreatic atrophy. No inflammatory changes or pancreatic duct dilation. Spleen: Normal in size without focal abnormality. Adrenals/Urinary Tract: There is marked distension of the urinary bladder, with limited evaluation of the bladder base due to streak artifact from bilateral hip arthroplasties. No obvious filling defects. There is mild bilateral renal cortical thinning, with grossly normal enhancement. No urinary tract calculi or obstruction. The adrenals are unremarkable. Stomach/Bowel: No bowel obstruction or ileus. The appendix is not identified. No bowel wall thickening or inflammatory change. Diverticulosis of the distal colon without diverticulitis. Vascular/Lymphatic: Aortic atherosclerosis. No enlarged abdominal or pelvic lymph nodes. Reproductive: Status post hysterectomy. No adnexal masses. Other: No free fluid or free intraperitoneal gas. No abdominal wall hernia. Musculoskeletal: Bilateral hip arthroplasties. No acute or destructive bony lesions. Reconstructed images demonstrate no additional findings. IMPRESSION: 1. Markedly distended urinary bladder. If patient is unable to void, catheter decompression could be considered. 2. Colonic diverticulosis without diverticulitis. 3. No bowel obstruction or ileus. 4. Small bilateral pleural effusions, right greater than left. 5. Cardiomegaly. 6. Aortic Atherosclerosis (ICD10-I70.0). Coronary artery atherosclerosis. Electronically Signed   By: Sharlet Salina M.D.   On: 03/10/2022 16:47     The results of significant diagnostics from this hospitalization (including imaging, microbiology, ancillary and laboratory) are listed below for reference.      Microbiology: No results found for this or any previous visit (from the past 240 hour(s)).   Labs: BNP (last 3 results) Recent Labs    03/25/22 0228 03/26/22 0555 03/27/22 0326  BNP 756.0* 789.1* 1,110.2*   Basic Metabolic Panel: Recent Labs  Lab 03/25/22 0228 03/26/22 0555 03/27/22 0326 03/28/22 0248 03/29/22 0522 03/29/22 1423 03/30/22 0549  NA 129* 131* 133* 134* 129* 132* 132*  K 4.5 4.8 4.3 4.1 4.0 3.9 4.0  CL 93* 92* 91* 92* 91* 90* 92*  CO2 26 30 31 31 28 31 30   GLUCOSE  137* 151* 194* 155* 145* 178* 142*  BUN 39* 33* 30* 30* 32* 36* 38*  CREATININE 1.03* 1.00 0.97 1.07* 1.07* 1.24* 1.14*  CALCIUM 8.5* 9.0 9.4 9.2 8.8* 8.5* 8.9  MG 2.0 2.1 2.0 2.0  --   --  2.0   Liver Function Tests: Recent Labs  Lab 03/25/22 0228 03/26/22 0555 03/27/22 0326 03/28/22 0248 03/29/22 0522  AST 331* 215* 146* 101* 74*  ALT 560* 420* 314* 248* 189*  ALKPHOS 188* 172* 163* 147* 140*  BILITOT 0.7 0.6 1.0 1.1 0.9  PROT 5.6* 6.1* 5.9* 6.0* 6.1*  ALBUMIN 3.0* 3.2* 3.2* 3.3* 3.2*   No results for input(s): "LIPASE", "AMYLASE" in the last 168 hours. Recent Labs  Lab 03/25/22 0228 03/27/22 0326  AMMONIA 35 26   CBC: Recent Labs  Lab 03/24/22 0542 03/25/22 0228 03/26/22 0555 03/27/22 0326 03/28/22 0248 03/30/22 0549  WBC 6.7 6.9 6.4 5.8 6.0 5.8  NEUTROABS 4.2 4.9 4.2 3.7 4.2  --   HGB 10.9* 10.9* 11.2* 11.0* 11.3* 10.6*  HCT 31.3* 33.0* 34.3* 33.2* 33.1* 30.8*  MCV 94.3 97.1 98.0 96.8 95.4 95.4  PLT 200 205 205 238 232 224   Cardiac Enzymes: No results for input(s): "CKTOTAL", "CKMB", "CKMBINDEX", "TROPONINI" in the last 168 hours. BNP: Invalid input(s): "POCBNP" CBG: Recent Labs  Lab 03/28/22 1213 03/28/22 1614 03/29/22 0743 03/29/22 1645 03/30/22 0802  GLUCAP 164* 123* 128* 181* 138*   D-Dimer No results for input(s): "DDIMER" in the last 72 hours. Hgb A1c No results for input(s): "HGBA1C" in the last 72 hours. Lipid Profile No results for  input(s): "CHOL", "HDL", "LDLCALC", "TRIG", "CHOLHDL", "LDLDIRECT" in the last 72 hours. Thyroid function studies No results for input(s): "TSH", "T4TOTAL", "T3FREE", "THYROIDAB" in the last 72 hours.  Invalid input(s): "FREET3" Anemia work up No results for input(s): "VITAMINB12", "FOLATE", "FERRITIN", "TIBC", "IRON", "RETICCTPCT" in the last 72 hours. Urinalysis    Component Value Date/Time   COLORURINE YELLOW 03/10/2022 1430   APPEARANCEUR HAZY (A) 03/10/2022 1430   LABSPEC 1.004 (L) 03/10/2022 1430   PHURINE 7.0 03/10/2022 1430   GLUCOSEU NEGATIVE 03/10/2022 1430   HGBUR SMALL (A) 03/10/2022 1430   BILIRUBINUR NEGATIVE 03/10/2022 1430   KETONESUR NEGATIVE 03/10/2022 1430   PROTEINUR NEGATIVE 03/10/2022 1430   UROBILINOGEN 0.2 05/20/2014 0802   NITRITE NEGATIVE 03/10/2022 1430   LEUKOCYTESUR LARGE (A) 03/10/2022 1430   Sepsis Labs Recent Labs  Lab 03/26/22 0555 03/27/22 0326 03/28/22 0248 03/30/22 0549  WBC 6.4 5.8 6.0 5.8   Microbiology No results found for this or any previous visit (from the past 240 hour(s)).   Time coordinating discharge:  I have spent 35 minutes face to face with the patient and on the ward discussing the patients care, assessment, plan and disposition with other care givers. >50% of the time was devoted counseling the patient about the risks and benefits of treatment/Discharge disposition and coordinating care.   SIGNED:   Dimple Nanas, MD  [facility] 03/30/2022, 2:17 PM   If 7PM-7AM, please contact night-coverage

## 2022-03-30 NOTE — Progress Notes (Signed)
   Brief PMT Progress Note:   Medical records reviewed including progress notes and imaging.  Patient initially was planned to discharge yesterday, however she has remained admitted due to sodium dropping again.  Currently stable today at 132.  Goals remain clear for return home with outpatient palliative care follow-up, hopefully today.  Patient's family has PMT contact information should further needs arise prior to discharge.Thank you for allowing PMT to assist in Erin Herman's care.  Richardson Dopp, [facility] Palliative Medicine Team  Team Phone # [phone]   NO CHARGE

## 2022-04-23 ENCOUNTER — Ambulatory Visit: Payer: Medicare Other | Admitting: Nurse Practitioner

## 2022-04-26 DEATH — deceased

## 2022-05-06 ENCOUNTER — Ambulatory Visit: Payer: Medicare Other | Admitting: Gastroenterology
